# Patient Record
Sex: Female | Born: 1965
Health system: Southern US, Community
[De-identification: ages and names within clinical notes are randomized; demographics above are authoritative.]

## PROBLEM LIST (undated history)

## (undated) DIAGNOSIS — F419 Anxiety disorder, unspecified: Secondary | ICD-10-CM

## (undated) DIAGNOSIS — D649 Anemia, unspecified: Secondary | ICD-10-CM

## (undated) DIAGNOSIS — K759 Inflammatory liver disease, unspecified: Secondary | ICD-10-CM

## (undated) DIAGNOSIS — M67972 Unspecified disorder of synovium and tendon, left ankle and foot: Secondary | ICD-10-CM

## (undated) DIAGNOSIS — G4733 Obstructive sleep apnea (adult) (pediatric): Secondary | ICD-10-CM

## (undated) DIAGNOSIS — Z9889 Other specified postprocedural states: Secondary | ICD-10-CM

## (undated) DIAGNOSIS — N8502 Endometrial intraepithelial neoplasia [EIN]: Secondary | ICD-10-CM

## (undated) DIAGNOSIS — R112 Nausea with vomiting, unspecified: Secondary | ICD-10-CM

## (undated) DIAGNOSIS — K589 Irritable bowel syndrome without diarrhea: Secondary | ICD-10-CM

## (undated) DIAGNOSIS — I10 Essential (primary) hypertension: Secondary | ICD-10-CM

## (undated) DIAGNOSIS — L430 Hypertrophic lichen planus: Secondary | ICD-10-CM

## (undated) DIAGNOSIS — G4700 Insomnia, unspecified: Secondary | ICD-10-CM

## (undated) DIAGNOSIS — K219 Gastro-esophageal reflux disease without esophagitis: Secondary | ICD-10-CM

## (undated) DIAGNOSIS — F32A Depression, unspecified: Secondary | ICD-10-CM

## (undated) DIAGNOSIS — M1990 Unspecified osteoarthritis, unspecified site: Secondary | ICD-10-CM

## (undated) DIAGNOSIS — F329 Major depressive disorder, single episode, unspecified: Secondary | ICD-10-CM

## (undated) HISTORY — DX: Major depressive disorder, single episode, unspecified: F32.9

## (undated) HISTORY — DX: Gastro-esophageal reflux disease without esophagitis: K21.9

## (undated) HISTORY — DX: Unspecified osteoarthritis, unspecified site: M19.90

## (undated) HISTORY — PX: UPPER GI ENDOSCOPY: SHX6162

## (undated) HISTORY — DX: Insomnia, unspecified: G47.00

## (undated) HISTORY — DX: Anemia, unspecified: D64.9

## (undated) HISTORY — DX: Obstructive sleep apnea (adult) (pediatric): G47.33

## (undated) HISTORY — DX: Hypertrophic lichen planus: L43.0

## (undated) HISTORY — DX: Depression, unspecified: F32.A

---

## 1985-08-24 HISTORY — PX: CHOLECYSTECTOMY: SHX55

## 1998-02-14 ENCOUNTER — Encounter: Admission: RE | Admit: 1998-02-14 | Discharge: 1998-02-14 | Payer: Self-pay | Admitting: Family Medicine

## 1998-02-19 ENCOUNTER — Encounter: Admission: RE | Admit: 1998-02-19 | Discharge: 1998-02-19 | Payer: Self-pay | Admitting: Family Medicine

## 1998-03-05 ENCOUNTER — Emergency Department (HOSPITAL_COMMUNITY): Admission: EM | Admit: 1998-03-05 | Discharge: 1998-03-05 | Payer: Self-pay | Admitting: Emergency Medicine

## 1998-04-05 ENCOUNTER — Encounter: Admission: RE | Admit: 1998-04-05 | Discharge: 1998-04-05 | Payer: Self-pay | Admitting: Family Medicine

## 1998-05-03 ENCOUNTER — Encounter: Admission: RE | Admit: 1998-05-03 | Discharge: 1998-05-03 | Payer: Self-pay | Admitting: Family Medicine

## 1998-05-17 ENCOUNTER — Encounter: Admission: RE | Admit: 1998-05-17 | Discharge: 1998-05-17 | Payer: Self-pay | Admitting: Family Medicine

## 1998-05-21 ENCOUNTER — Encounter: Admission: RE | Admit: 1998-05-21 | Discharge: 1998-05-21 | Payer: Self-pay | Admitting: Sports Medicine

## 1998-05-28 ENCOUNTER — Encounter: Admission: RE | Admit: 1998-05-28 | Discharge: 1998-05-28 | Payer: Self-pay | Admitting: Family Medicine

## 1998-07-16 ENCOUNTER — Emergency Department (HOSPITAL_COMMUNITY): Admission: EM | Admit: 1998-07-16 | Discharge: 1998-07-16 | Payer: Self-pay | Admitting: Emergency Medicine

## 1998-08-09 ENCOUNTER — Encounter: Admission: RE | Admit: 1998-08-09 | Discharge: 1998-08-09 | Payer: Self-pay | Admitting: Family Medicine

## 1998-08-26 ENCOUNTER — Encounter: Admission: RE | Admit: 1998-08-26 | Discharge: 1998-08-26 | Payer: Self-pay | Admitting: Family Medicine

## 1998-08-29 ENCOUNTER — Encounter: Admission: RE | Admit: 1998-08-29 | Discharge: 1998-08-29 | Payer: Self-pay | Admitting: Family Medicine

## 1998-09-20 ENCOUNTER — Encounter: Admission: RE | Admit: 1998-09-20 | Discharge: 1998-09-20 | Payer: Self-pay | Admitting: Family Medicine

## 1998-10-08 ENCOUNTER — Encounter: Admission: RE | Admit: 1998-10-08 | Discharge: 1998-10-08 | Payer: Self-pay | Admitting: Sports Medicine

## 1998-10-25 ENCOUNTER — Encounter: Admission: RE | Admit: 1998-10-25 | Discharge: 1998-10-25 | Payer: Self-pay | Admitting: Family Medicine

## 1998-11-20 ENCOUNTER — Encounter: Payer: Self-pay | Admitting: Gastroenterology

## 1998-11-20 ENCOUNTER — Ambulatory Visit (HOSPITAL_COMMUNITY): Admission: RE | Admit: 1998-11-20 | Discharge: 1998-11-20 | Payer: Self-pay | Admitting: Gastroenterology

## 1999-01-30 ENCOUNTER — Encounter: Admission: RE | Admit: 1999-01-30 | Discharge: 1999-01-30 | Payer: Self-pay | Admitting: Family Medicine

## 1999-01-31 ENCOUNTER — Encounter: Admission: RE | Admit: 1999-01-31 | Discharge: 1999-01-31 | Payer: Self-pay | Admitting: Family Medicine

## 1999-03-20 ENCOUNTER — Encounter: Payer: Self-pay | Admitting: Obstetrics and Gynecology

## 1999-03-20 ENCOUNTER — Ambulatory Visit (HOSPITAL_COMMUNITY): Admission: RE | Admit: 1999-03-20 | Discharge: 1999-03-20 | Payer: Self-pay | Admitting: Obstetrics and Gynecology

## 1999-04-07 ENCOUNTER — Ambulatory Visit (HOSPITAL_COMMUNITY): Admission: RE | Admit: 1999-04-07 | Discharge: 1999-04-07 | Payer: Self-pay | Admitting: Gastroenterology

## 1999-04-07 ENCOUNTER — Encounter: Payer: Self-pay | Admitting: Gastroenterology

## 1999-04-14 ENCOUNTER — Encounter: Admission: RE | Admit: 1999-04-14 | Discharge: 1999-04-14 | Payer: Self-pay | Admitting: Family Medicine

## 1999-05-07 ENCOUNTER — Encounter: Admission: RE | Admit: 1999-05-07 | Discharge: 1999-05-07 | Payer: Self-pay | Admitting: Family Medicine

## 1999-05-28 ENCOUNTER — Encounter: Admission: RE | Admit: 1999-05-28 | Discharge: 1999-05-28 | Payer: Self-pay | Admitting: Family Medicine

## 1999-08-27 ENCOUNTER — Encounter: Admission: RE | Admit: 1999-08-27 | Discharge: 1999-08-27 | Payer: Self-pay | Admitting: Family Medicine

## 1999-09-29 ENCOUNTER — Encounter: Admission: RE | Admit: 1999-09-29 | Discharge: 1999-10-30 | Payer: Self-pay | Admitting: Sports Medicine

## 1999-10-13 ENCOUNTER — Encounter: Admission: RE | Admit: 1999-10-13 | Discharge: 1999-10-13 | Payer: Self-pay | Admitting: Family Medicine

## 1999-11-18 ENCOUNTER — Encounter: Admission: RE | Admit: 1999-11-18 | Discharge: 1999-11-18 | Payer: Self-pay | Admitting: Sports Medicine

## 2000-02-23 ENCOUNTER — Encounter: Admission: RE | Admit: 2000-02-23 | Discharge: 2000-02-23 | Payer: Self-pay | Admitting: Family Medicine

## 2000-03-04 ENCOUNTER — Encounter: Admission: RE | Admit: 2000-03-04 | Discharge: 2000-03-04 | Payer: Self-pay | Admitting: Family Medicine

## 2000-04-27 ENCOUNTER — Ambulatory Visit (HOSPITAL_COMMUNITY): Admission: RE | Admit: 2000-04-27 | Discharge: 2000-04-27 | Payer: Self-pay | Admitting: Gastroenterology

## 2000-04-27 ENCOUNTER — Encounter (INDEPENDENT_AMBULATORY_CARE_PROVIDER_SITE_OTHER): Payer: Self-pay | Admitting: *Deleted

## 2000-05-27 ENCOUNTER — Encounter: Admission: RE | Admit: 2000-05-27 | Discharge: 2000-05-27 | Payer: Self-pay | Admitting: Family Medicine

## 2000-06-18 ENCOUNTER — Encounter: Admission: RE | Admit: 2000-06-18 | Discharge: 2000-06-18 | Payer: Self-pay | Admitting: Family Medicine

## 2000-06-18 ENCOUNTER — Ambulatory Visit (HOSPITAL_COMMUNITY): Admission: RE | Admit: 2000-06-18 | Discharge: 2000-06-18 | Payer: Self-pay | Admitting: Family Medicine

## 2000-07-12 ENCOUNTER — Ambulatory Visit (HOSPITAL_COMMUNITY): Admission: RE | Admit: 2000-07-12 | Discharge: 2000-07-12 | Payer: Self-pay | Admitting: Sports Medicine

## 2000-08-09 ENCOUNTER — Encounter: Payer: Self-pay | Admitting: Obstetrics and Gynecology

## 2000-08-09 ENCOUNTER — Ambulatory Visit (HOSPITAL_COMMUNITY): Admission: RE | Admit: 2000-08-09 | Discharge: 2000-08-09 | Payer: Self-pay | Admitting: Obstetrics and Gynecology

## 2000-10-12 ENCOUNTER — Encounter: Admission: RE | Admit: 2000-10-12 | Discharge: 2000-10-12 | Payer: Self-pay | Admitting: Sports Medicine

## 2000-12-22 ENCOUNTER — Encounter: Admission: RE | Admit: 2000-12-22 | Discharge: 2000-12-22 | Payer: Self-pay | Admitting: Family Medicine

## 2001-01-28 ENCOUNTER — Encounter: Admission: RE | Admit: 2001-01-28 | Discharge: 2001-01-28 | Payer: Self-pay | Admitting: Family Medicine

## 2001-06-16 ENCOUNTER — Ambulatory Visit (HOSPITAL_BASED_OUTPATIENT_CLINIC_OR_DEPARTMENT_OTHER): Admission: RE | Admit: 2001-06-16 | Discharge: 2001-06-16 | Payer: Self-pay | Admitting: Orthopedic Surgery

## 2001-07-12 ENCOUNTER — Encounter: Admission: RE | Admit: 2001-07-12 | Discharge: 2001-10-06 | Payer: Self-pay | Admitting: Orthopedic Surgery

## 2001-10-25 ENCOUNTER — Other Ambulatory Visit: Admission: RE | Admit: 2001-10-25 | Discharge: 2001-10-25 | Payer: Self-pay | Admitting: Obstetrics and Gynecology

## 2001-11-18 ENCOUNTER — Encounter: Payer: Self-pay | Admitting: Obstetrics and Gynecology

## 2001-11-18 ENCOUNTER — Ambulatory Visit (HOSPITAL_COMMUNITY): Admission: RE | Admit: 2001-11-18 | Discharge: 2001-11-18 | Payer: Self-pay | Admitting: Obstetrics and Gynecology

## 2002-03-19 ENCOUNTER — Encounter: Payer: Self-pay | Admitting: Emergency Medicine

## 2002-03-19 ENCOUNTER — Emergency Department (HOSPITAL_COMMUNITY): Admission: EM | Admit: 2002-03-19 | Discharge: 2002-03-19 | Payer: Self-pay

## 2002-08-24 HISTORY — PX: KNEE SURGERY: SHX244

## 2003-10-18 ENCOUNTER — Emergency Department (HOSPITAL_COMMUNITY): Admission: AD | Admit: 2003-10-18 | Discharge: 2003-10-18 | Payer: Self-pay | Admitting: Family Medicine

## 2004-04-10 ENCOUNTER — Emergency Department (HOSPITAL_COMMUNITY): Admission: EM | Admit: 2004-04-10 | Discharge: 2004-04-10 | Payer: Self-pay | Admitting: Family Medicine

## 2004-11-18 ENCOUNTER — Ambulatory Visit (HOSPITAL_COMMUNITY): Admission: RE | Admit: 2004-11-18 | Discharge: 2004-11-18 | Payer: Self-pay | Admitting: Obstetrics and Gynecology

## 2005-07-24 ENCOUNTER — Emergency Department (HOSPITAL_COMMUNITY): Admission: EM | Admit: 2005-07-24 | Discharge: 2005-07-24 | Payer: Self-pay | Admitting: Emergency Medicine

## 2006-01-27 ENCOUNTER — Ambulatory Visit (HOSPITAL_COMMUNITY): Admission: RE | Admit: 2006-01-27 | Discharge: 2006-01-27 | Payer: Self-pay | Admitting: Obstetrics and Gynecology

## 2006-02-10 ENCOUNTER — Encounter: Admission: RE | Admit: 2006-02-10 | Discharge: 2006-02-10 | Payer: Self-pay | Admitting: Obstetrics and Gynecology

## 2006-08-30 ENCOUNTER — Emergency Department (HOSPITAL_COMMUNITY): Admission: EM | Admit: 2006-08-30 | Discharge: 2006-08-31 | Payer: Self-pay | Admitting: Emergency Medicine

## 2006-09-08 ENCOUNTER — Emergency Department (HOSPITAL_COMMUNITY): Admission: EM | Admit: 2006-09-08 | Discharge: 2006-09-08 | Payer: Self-pay | Admitting: Emergency Medicine

## 2007-04-28 ENCOUNTER — Ambulatory Visit (HOSPITAL_COMMUNITY): Admission: RE | Admit: 2007-04-28 | Discharge: 2007-04-28 | Payer: Self-pay | Admitting: Obstetrics and Gynecology

## 2007-12-16 ENCOUNTER — Emergency Department (HOSPITAL_COMMUNITY): Admission: EM | Admit: 2007-12-16 | Discharge: 2007-12-17 | Payer: Self-pay | Admitting: Emergency Medicine

## 2008-01-09 ENCOUNTER — Emergency Department (HOSPITAL_COMMUNITY): Admission: EM | Admit: 2008-01-09 | Discharge: 2008-01-09 | Payer: Self-pay | Admitting: Emergency Medicine

## 2008-02-01 ENCOUNTER — Ambulatory Visit: Payer: Self-pay | Admitting: Family Medicine

## 2008-02-01 ENCOUNTER — Encounter: Payer: Self-pay | Admitting: Family Medicine

## 2008-02-01 DIAGNOSIS — F3289 Other specified depressive episodes: Secondary | ICD-10-CM | POA: Insufficient documentation

## 2008-02-01 DIAGNOSIS — D649 Anemia, unspecified: Secondary | ICD-10-CM | POA: Insufficient documentation

## 2008-02-01 DIAGNOSIS — K219 Gastro-esophageal reflux disease without esophagitis: Secondary | ICD-10-CM | POA: Insufficient documentation

## 2008-02-01 LAB — CONVERTED CEMR LAB
ALT: 18 units/L (ref 0–35)
AST: 20 units/L (ref 0–37)
Alkaline Phosphatase: 39 units/L (ref 39–117)
LDL Cholesterol: 123 mg/dL — ABNORMAL HIGH (ref 0–99)
MCHC: 32.9 g/dL (ref 30.0–36.0)
MCV: 83.6 fL (ref 78.0–100.0)
Platelets: 349 10*3/uL (ref 150–400)
Potassium: 4.1 meq/L (ref 3.5–5.3)
RDW: 14.2 % (ref 11.5–15.5)
Sodium: 140 meq/L (ref 135–145)
TSH: 1.544 microintl units/mL (ref 0.350–5.50)
Total Bilirubin: 0.3 mg/dL (ref 0.3–1.2)
Total Protein: 7.9 g/dL (ref 6.0–8.3)
Triglycerides: 97 mg/dL (ref <150)
VLDL: 19 mg/dL (ref 0–40)

## 2008-02-03 ENCOUNTER — Encounter: Payer: Self-pay | Admitting: Family Medicine

## 2008-03-01 ENCOUNTER — Telehealth: Payer: Self-pay | Admitting: *Deleted

## 2008-03-17 ENCOUNTER — Emergency Department (HOSPITAL_COMMUNITY): Admission: EM | Admit: 2008-03-17 | Discharge: 2008-03-17 | Payer: Self-pay | Admitting: Emergency Medicine

## 2008-03-19 ENCOUNTER — Telehealth: Payer: Self-pay | Admitting: *Deleted

## 2008-04-06 ENCOUNTER — Ambulatory Visit: Payer: Self-pay | Admitting: Family Medicine

## 2008-06-08 ENCOUNTER — Ambulatory Visit (HOSPITAL_COMMUNITY): Admission: RE | Admit: 2008-06-08 | Discharge: 2008-06-08 | Payer: Self-pay | Admitting: Obstetrics and Gynecology

## 2008-06-15 ENCOUNTER — Ambulatory Visit: Payer: Self-pay | Admitting: Family Medicine

## 2008-07-17 ENCOUNTER — Telehealth: Payer: Self-pay | Admitting: Family Medicine

## 2008-07-29 ENCOUNTER — Emergency Department (HOSPITAL_COMMUNITY): Admission: EM | Admit: 2008-07-29 | Discharge: 2008-07-29 | Payer: Self-pay | Admitting: Emergency Medicine

## 2008-08-31 ENCOUNTER — Telehealth (INDEPENDENT_AMBULATORY_CARE_PROVIDER_SITE_OTHER): Payer: Self-pay | Admitting: *Deleted

## 2008-09-09 ENCOUNTER — Emergency Department (HOSPITAL_COMMUNITY): Admission: EM | Admit: 2008-09-09 | Discharge: 2008-09-09 | Payer: Self-pay | Admitting: Emergency Medicine

## 2008-10-15 ENCOUNTER — Encounter: Payer: Self-pay | Admitting: Family Medicine

## 2008-10-18 ENCOUNTER — Telehealth: Payer: Self-pay | Admitting: Family Medicine

## 2008-11-09 ENCOUNTER — Ambulatory Visit: Payer: Self-pay | Admitting: Family Medicine

## 2008-11-09 ENCOUNTER — Ambulatory Visit (HOSPITAL_COMMUNITY): Payer: Self-pay | Admitting: Psychiatry

## 2008-11-13 ENCOUNTER — Telehealth (INDEPENDENT_AMBULATORY_CARE_PROVIDER_SITE_OTHER): Payer: Self-pay | Admitting: *Deleted

## 2008-11-14 ENCOUNTER — Telehealth (INDEPENDENT_AMBULATORY_CARE_PROVIDER_SITE_OTHER): Payer: Self-pay | Admitting: *Deleted

## 2008-11-16 ENCOUNTER — Telehealth (INDEPENDENT_AMBULATORY_CARE_PROVIDER_SITE_OTHER): Payer: Self-pay | Admitting: Family Medicine

## 2008-11-22 ENCOUNTER — Emergency Department (HOSPITAL_COMMUNITY): Admission: EM | Admit: 2008-11-22 | Discharge: 2008-11-22 | Payer: Self-pay | Admitting: Emergency Medicine

## 2008-12-06 ENCOUNTER — Encounter: Payer: Self-pay | Admitting: Family Medicine

## 2008-12-06 ENCOUNTER — Ambulatory Visit (HOSPITAL_BASED_OUTPATIENT_CLINIC_OR_DEPARTMENT_OTHER): Admission: RE | Admit: 2008-12-06 | Discharge: 2008-12-06 | Payer: Self-pay | Admitting: Family Medicine

## 2008-12-08 ENCOUNTER — Ambulatory Visit: Payer: Self-pay | Admitting: Internal Medicine

## 2008-12-14 ENCOUNTER — Telehealth: Payer: Self-pay | Admitting: Family Medicine

## 2008-12-17 ENCOUNTER — Telehealth: Payer: Self-pay | Admitting: *Deleted

## 2008-12-18 ENCOUNTER — Telehealth (INDEPENDENT_AMBULATORY_CARE_PROVIDER_SITE_OTHER): Payer: Self-pay | Admitting: *Deleted

## 2008-12-26 ENCOUNTER — Telehealth (INDEPENDENT_AMBULATORY_CARE_PROVIDER_SITE_OTHER): Payer: Self-pay | Admitting: *Deleted

## 2008-12-28 ENCOUNTER — Ambulatory Visit: Payer: Self-pay | Admitting: Pulmonary Disease

## 2008-12-28 DIAGNOSIS — G4733 Obstructive sleep apnea (adult) (pediatric): Secondary | ICD-10-CM | POA: Insufficient documentation

## 2009-01-02 ENCOUNTER — Encounter: Payer: Self-pay | Admitting: Family Medicine

## 2009-01-04 ENCOUNTER — Ambulatory Visit: Payer: Self-pay | Admitting: Family Medicine

## 2009-01-04 DIAGNOSIS — R030 Elevated blood-pressure reading, without diagnosis of hypertension: Secondary | ICD-10-CM | POA: Insufficient documentation

## 2009-01-04 DIAGNOSIS — E669 Obesity, unspecified: Secondary | ICD-10-CM | POA: Insufficient documentation

## 2009-01-08 ENCOUNTER — Encounter: Payer: Self-pay | Admitting: *Deleted

## 2009-01-09 ENCOUNTER — Telehealth: Payer: Self-pay | Admitting: Family Medicine

## 2009-01-17 ENCOUNTER — Ambulatory Visit: Payer: Self-pay | Admitting: Family Medicine

## 2009-01-17 ENCOUNTER — Telehealth: Payer: Self-pay | Admitting: *Deleted

## 2009-01-18 ENCOUNTER — Telehealth: Payer: Self-pay | Admitting: *Deleted

## 2009-01-20 ENCOUNTER — Emergency Department (HOSPITAL_COMMUNITY): Admission: EM | Admit: 2009-01-20 | Discharge: 2009-01-20 | Payer: Self-pay | Admitting: Family Medicine

## 2009-01-22 ENCOUNTER — Ambulatory Visit: Payer: Self-pay | Admitting: Family Medicine

## 2009-01-22 ENCOUNTER — Encounter: Payer: Self-pay | Admitting: Family Medicine

## 2009-01-23 ENCOUNTER — Telehealth: Payer: Self-pay | Admitting: *Deleted

## 2009-01-24 ENCOUNTER — Telehealth: Payer: Self-pay | Admitting: Family Medicine

## 2009-01-29 ENCOUNTER — Encounter: Payer: Self-pay | Admitting: Family Medicine

## 2009-02-01 ENCOUNTER — Ambulatory Visit: Payer: Self-pay | Admitting: Family Medicine

## 2009-02-01 ENCOUNTER — Encounter: Payer: Self-pay | Admitting: Family Medicine

## 2009-02-07 ENCOUNTER — Telehealth: Payer: Self-pay | Admitting: Family Medicine

## 2009-02-08 ENCOUNTER — Telehealth: Payer: Self-pay | Admitting: Family Medicine

## 2009-02-15 ENCOUNTER — Telehealth: Payer: Self-pay | Admitting: Family Medicine

## 2009-02-26 ENCOUNTER — Telehealth: Payer: Self-pay | Admitting: *Deleted

## 2009-03-11 ENCOUNTER — Telehealth: Payer: Self-pay | Admitting: *Deleted

## 2009-06-19 ENCOUNTER — Ambulatory Visit: Payer: Self-pay | Admitting: Family Medicine

## 2009-07-22 ENCOUNTER — Encounter: Admission: RE | Admit: 2009-07-22 | Discharge: 2009-07-22 | Payer: Self-pay | Admitting: Family Medicine

## 2009-07-31 ENCOUNTER — Emergency Department (HOSPITAL_COMMUNITY): Admission: EM | Admit: 2009-07-31 | Discharge: 2009-07-31 | Payer: Self-pay | Admitting: Emergency Medicine

## 2009-08-13 ENCOUNTER — Ambulatory Visit: Payer: Self-pay | Admitting: Family Medicine

## 2009-08-13 LAB — CONVERTED CEMR LAB

## 2009-08-19 ENCOUNTER — Encounter: Payer: Self-pay | Admitting: Family Medicine

## 2009-08-19 ENCOUNTER — Ambulatory Visit: Payer: Self-pay | Admitting: Family Medicine

## 2009-08-19 LAB — CONVERTED CEMR LAB
AST: 13 units/L (ref 0–37)
Albumin: 4.2 g/dL (ref 3.5–5.2)
Alkaline Phosphatase: 40 units/L (ref 39–117)
BUN: 8 mg/dL (ref 6–23)
Creatinine, Ser: 0.86 mg/dL (ref 0.40–1.20)
HDL: 73 mg/dL (ref >39)
Hemoglobin: 10.2 g/dL — ABNORMAL LOW (ref 12.0–15.0)
LDL Cholesterol: 126 mg/dL — ABNORMAL HIGH (ref 0–99)
MCHC: 30.9 g/dL (ref 30.0–36.0)
Platelets: 339 10*3/uL (ref 150–400)
Potassium: 4 meq/L (ref 3.5–5.3)
RDW: 16.3 % — ABNORMAL HIGH (ref 11.5–15.5)
TSH: 1.157 microintl units/mL (ref 0.350–4.500)
Total CHOL/HDL Ratio: 2.9

## 2009-08-20 ENCOUNTER — Telehealth: Payer: Self-pay | Admitting: Family Medicine

## 2009-09-19 ENCOUNTER — Telehealth (INDEPENDENT_AMBULATORY_CARE_PROVIDER_SITE_OTHER): Payer: Self-pay | Admitting: *Deleted

## 2009-11-06 ENCOUNTER — Ambulatory Visit: Payer: Self-pay | Admitting: Family Medicine

## 2009-11-06 DIAGNOSIS — F411 Generalized anxiety disorder: Secondary | ICD-10-CM | POA: Insufficient documentation

## 2009-11-22 ENCOUNTER — Encounter: Payer: Self-pay | Admitting: Family Medicine

## 2009-12-06 ENCOUNTER — Ambulatory Visit: Payer: Self-pay | Admitting: Family Medicine

## 2009-12-06 ENCOUNTER — Encounter: Payer: Self-pay | Admitting: Family Medicine

## 2009-12-08 ENCOUNTER — Encounter: Payer: Self-pay | Admitting: Family Medicine

## 2009-12-09 ENCOUNTER — Telehealth: Payer: Self-pay | Admitting: Family Medicine

## 2010-01-16 ENCOUNTER — Ambulatory Visit: Payer: Self-pay | Admitting: Family Medicine

## 2010-04-09 ENCOUNTER — Telehealth: Payer: Self-pay | Admitting: Family Medicine

## 2010-04-11 ENCOUNTER — Ambulatory Visit: Payer: Self-pay | Admitting: Family Medicine

## 2010-07-03 ENCOUNTER — Encounter: Payer: Self-pay | Admitting: Family Medicine

## 2010-08-06 ENCOUNTER — Telehealth: Payer: Self-pay | Admitting: Family Medicine

## 2010-09-03 ENCOUNTER — Ambulatory Visit (HOSPITAL_COMMUNITY)
Admission: RE | Admit: 2010-09-03 | Discharge: 2010-09-03 | Payer: Self-pay | Source: Home / Self Care | Attending: Family Medicine | Admitting: Family Medicine

## 2010-09-14 ENCOUNTER — Encounter: Payer: Self-pay | Admitting: Orthopedic Surgery

## 2010-09-14 ENCOUNTER — Encounter: Payer: Self-pay | Admitting: Family Medicine

## 2010-09-14 ENCOUNTER — Encounter: Payer: Self-pay | Admitting: Obstetrics and Gynecology

## 2010-09-23 NOTE — Assessment & Plan Note (Signed)
Summary: discuss antidepressants/[place]   Vital Signs:  Patient profile:   45 year old female Height:      65.5 inches Weight:      254.9 pounds BMI:     41.92 Pulse rate:   76 / minute BP sitting:   138 / 86  (left arm) Cuff size:   large  Vitals Entered By: [name] slade,cma CC: discuss antidepressant. Is Patient Diabetic? No Pain Assessment Patient in pain? no        Primary Care Dangelo Guzzetta:  Cat Ta MD  CC:  discuss antidepressant..  History of Present Illness: 45 y/o F here for med s/e.  She has history of anxiety and is taking welbutrin xr 300mg .   Took herself off of her med in April, did this without informing us.  Then she resumed the medicine 2 wks ago, at dose of 300mg .  When she started on welbutrin initial dose was 150mg .  After beeing on the med for 1 1/2 wks she began to feel  +nausea +headache +decrease appetite -vomiting -fever +some abd pain +feeling bad all over -palpitations + chills  She stopped taking welbutrin 3 days ago.  She is beginning to feel better, but not 100%.    Habits & Providers  Alcohol-Tobacco-Diet     Tobacco Status: never  Current Medications (verified): 1)  Nexium 40 Mg  Cpdr (Esomeprazole Magnesium) .Marland Kitchen.. 1 Tab By Mouth Daily 2)  Bupropion Hcl 150 Mg Xr24h-Tab (Bupropion Hcl) .Marland Kitchen.. 1 Tab By Mouth Daily 3)  Clobetasol Propionate 0.05 % Oint (Clobetasol Propionate) .... Apply To Affected Area Twice A Day Until Resolved 4)  Ferrous Sulfate 325 (65 Fe) Mg Tabs (Ferrous Sulfate) .Marland Kitchen.. 1 Tab By Mouth Two Times A Day For Iron Deficiency 5)  Colace 100 Mg Caps (Docusate Sodium) .Marland Kitchen.. 1 Tab By Mouth Two Times A Day While Taking Iron  Allergies (verified): No Known Drug Allergies  Past History:  Past Medical History: Last updated: 08/13/2009 Anemia of unknown cause Depression SLEEP APNEA, OBSTRUCTIVE: stopped using cpap because it irritated her ALLERGIC RHINITIS (ICD-477.9) ATYPICAL DEPRESSIVE DISORDER (ICD-296.82) INSOMNIA  (ICD-780.52) OSTEOARTHRITIS (ICD-715.90)    of knees, shoulders, left hip ANEMIA (ICD-285.9) GERD (ICD-530.81) G2P1011 by NSVD    Past Surgical History: Last updated: 08/13/2009 Cholecystectomy 1987 Left knee lateral release 2004 - Dr. Eulah Pont  Family History: Last updated: 08/13/2009 Mother living, 61 OA, DJD, HTN, impaired glucose tolerance Father living, 70 DM 2 sisters with HTN 4 other siblings, all living    allergies: son, mother, sister asthma: son clotting disorders: mother rheumatism:mother   Social History: Last updated: 08/13/2009 pt is single.  Lives with 7 year old son.    No tobacco.   No illicit drug use.   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   Risk Factors: Alcohol Use: <1 (08/13/2009) Exercise: no (08/13/2009)  Risk Factors: Smoking Status: never (04/11/2010)  Review of Systems       per hpi   Physical Exam  General:  Well-developed,well-nourished,in no acute distress; alert,appropriate and cooperative throughout examination. vitals reviewed.  Psych:  Cognition and judgment appear intact. Alert and cooperative with normal attention span and concentration. No apparent delusions, illusions, hallucinationsnot agitated, not suicidal, and not homicidal.     Impression & Recommendations:  Problem # 1:  ANXIETY DISORDER (ICD-300.00) Assessment Unchanged Pt had s/e after taking herself off bupropion for 4 months and restarting at higher dose (300mg ) than initial dose (150mg ).  We discussed not  repeating this and to wait 2 wks before resuming bupropion.  Will restart at lower dose of 150mg .  Pt to rtc in 2 mos for re-eval.  She agrees.  Her updated medication list for this problem includes:    Bupropion Hcl 150 Mg Xr24h-tab (Bupropion hcl) .Marland Kitchen... 1 tab by mouth daily  Orders: [facility]- Est Level  3 (21308)  Problem # 2:  ELEVATED BLOOD PRESSURE WITHOUT DIAGNOSIS OF HYPERTENSION (ICD-796.2) BP elevated today  but may be 2/2 to overall not feeling well.  We will continue to monitor.    Complete Medication List: 1)  Nexium 40 Mg Cpdr (Esomeprazole magnesium) .Marland Kitchen.. 1 tab by mouth daily 2)  Bupropion Hcl 150 Mg Xr24h-tab (Bupropion hcl) .Marland Kitchen.. 1 tab by mouth daily 3)  Clobetasol Propionate 0.05 % Oint (Clobetasol propionate) .... Apply to affected area twice a day until resolved 4)  Ferrous Sulfate 325 (65 Fe) Mg Tabs (Ferrous sulfate) .Marland Kitchen.. 1 tab by mouth two times a day for iron deficiency 5)  Colace 100 Mg Caps (Docusate sodium) .Marland Kitchen.. 1 tab by mouth two times a day while taking iron  Patient Instructions: 1)  Please schedule a follow-up appointment in 1-2 months.  2)  Starting Monday Aug 29, take one tablet of Bupoprion 150mg  in AM.  Take this daily.

## 2010-09-23 NOTE — Letter (Signed)
Summary: Lipid Letter  [facility]  [address]   [place], [place] [address]   Phone: [phone]  Fax: [phone]    12/08/2009  Barbara Reese [address] [place], [place]  [address]  Dear [name]:  We have carefully reviewed your last lipid profile from 12/06/2009 and the results are noted below with a summary of recommendations for lipid management.    LDL "bad" Cholesterol:    527  Your LDL has been increasing over the last 2 years.  In 07/2009 your LDL was 126, and 123 the year prior.  I feel that given your risk factors, it would be beneficial to your overall health to start taking a cholesterol lowering medicine.  At your last visit we discussed exercise and diet to help lower your weight and cholesterol.  You may try the lifestyle modifications included in this letter.  We can discuss this further at the next appointment.      TLC Diet (Therapeutic Lifestyle Change): Saturated Fats & Transfatty acids should be kept < 7% of total calories ***Reduce Saturated Fats Polyunstaurated Fat can be up to 10% of total calories Monounsaturated Fat Fat can be up to 20% of total calories Total Fat should be no greater than 25-35% of total calories Carbohydrates should be 50-60% of total calories Protein should be approximately 15% of total calories Fiber should be at least 20-30 grams a day ***Increased fiber may help lower LDL Total Cholesterol should be < 200mg /day Consider adding Reese stanol/sterols to diet (example: Benacol spread) ***A higher intake of unsaturated fat may reduce Triglycerides and Increase HDL    Adjunctive Measures (may lower LIPIDS and reduce risk of Heart Attack) include: Aerobic Exercise (20-30 minutes 3-4 times a week) Limit Alcohol Consumption Weight Reduction Aspirin 75-81 mg a day by mouth (if not allergic or contraindicated) Dietary Fiber 20-30 grams a day by mouth     Current Medications: 1)    Nexium 40 Mg  Cpdr  (Esomeprazole magnesium) .Marland Kitchen.. 1 tab by mouth daily 2)    Bupropion Hcl 300 Mg Xr24h-tab (Bupropion hcl) .Marland Kitchen.. 1 tab by mouth at bedtime.  note increased dose. 3)    Clobetasol Propionate 0.05 % Oint (Clobetasol propionate) .... Apply to affected area twice a day until resolved 4)    Ortho Micronor 0.35 Mg Tabs (Norethindrone) .Marland Kitchen.. 1 tab by mouth everyday for 3 months, then stop for a week, then resume for another 3 months, repeat. 5)    Ferrous Sulfate 325 (65 Fe) Mg Tabs (Ferrous sulfate) .Marland Kitchen.. 1 tab by mouth two times a day for iron deficiency 6)    Colace 100 Mg Caps (Docusate sodium) .Marland Kitchen.. 1 tab by mouth two times a day while taking iron  If you have any questions, please call. We appreciate being able to work with you.   Sincerely,    [facility] Moni Rothrock MD  Appended Document: Lipid Letter mailed.

## 2010-09-23 NOTE — Progress Notes (Signed)
Summary: TRIAGE  Phone Note Call from Patient Call back at Home Phone [phone]   Caller: Patient Summary of Call: Can be reached after 3:00 at home number.  Pt still has questions concerning side effects of medication for Wellbutrin. Initial call taken by: Clydell Hakim,  April 09, 2010 2:37 PM  Follow-up for Phone Call        Please ask pt to schedule appt.  She dnkas two appts.  I will see her in the clinic. Cat Ta MD  April 10, 2010 2:14 PM  Follow-up by: Golden Circle RN,  April 10, 2010 11:42 AM  Additional Follow-up for Phone Call Additional follow up Details #1::        started the 300mg  03/28/10. says her appetite is down & she has a bad taste in her mouth.  is she going to have the same side effects at the 150 dose? has not bought the 150mg  yet. has not taken anything in 2 days. appt made for her to meet with md at 3:15 tomorrow Additional Follow-up by: Golden Circle RN,  April 10, 2010 3:07 PM

## 2010-09-23 NOTE — Progress Notes (Signed)
Summary: triage  Phone Note Call from Patient Call back at Work Phone [phone]   Caller: Patient Summary of Call: Pt said she has white bumps in her vaginia and went last night and bought an anti fungal med and used one tube of this.  She has also started her period.   Initial call taken by: Clydell Hakim,  September 19, 2009 1:45 PM  Follow-up for Phone Call        patient states the white bumps are now gone. she did have some vaginal itching and small amount of discharge and this has improved also after using the cream. advised to call back if bumps  or symptoms return. Follow-up by: Theresia Lo RN,  September 19, 2009 2:18 PM

## 2010-09-23 NOTE — Progress Notes (Signed)
Summary: Lab Res  Phone Note Call from Patient Call back at Work Phone [phone]   Caller: Patient Summary of Call: Checking on blood work from Friday. Initial call taken by: Clydell Hakim,  December 09, 2009 11:41 AM  Follow-up for Phone Call        wll forward to Dr. Janalyn Harder Follow-up by: Theresia Lo RN,  December 09, 2009 12:01 PM  Additional Follow-up for Phone Call Additional follow up Details #1::        Letter sent regarding lab result.  Pt has appt this afternoon, can discuss then.  Additional Follow-up by: Shamarion Coots MD,  December 09, 2009 12:19 PM

## 2010-09-23 NOTE — Assessment & Plan Note (Signed)
Summary: Barbara Reese   Vital Signs:  Patient profile:   45 year old female Height:      65.5 inches Weight:      251.9 pounds BMI:     41.43 Pulse rate:   75 / minute BP sitting:   121 / 81  (left arm) Cuff size:   large  Vitals Entered By: [name] slade,cma CC: f/up labs.  Is Patient Diabetic? No Pain Assessment Patient in pain? no        Primary Care Provider:  Yoltzin Ransom MD  CC:  f/up labs. .  History of Present Illness: 45 y/o F   Anxiety Taking Buproprion 300mg  at bedtime.  No problems with meds.   No panic attacks  GERD taking Nexium daily without problems has not noticed problem with certain foods (spicy) No weight loss   OBESITY BMI: 41.43 Weight difference since last OV: +4lbs Exercise: walking everyday for 30 minutes at brisk pace.  Goes to gym 2-3x/week, riding bike, treadmill, weights.  Been doing this for 1 month.  Walking now for 2-3 months.   Diet: cut down on "sugar intake".  Using less sugar in coffee.  Drinking less sodas.  Not adding sugar to cereal. Admits to no portion control  Skin: Seeing Dr Joseph Art, Dermatologist.  He did b and  told her over a year ago, showing allergic reaction to a bite.  Rx Clobestasol ointment.  Wants to know if it is safe to continue,  Appt on 6/1 with Dr Joseph Art    Habits & Providers  Alcohol-Tobacco-Diet     Tobacco Status: never  Current Medications (verified): 1)  Nexium 40 Mg  Cpdr (Esomeprazole Magnesium) .Marland Kitchen.. 1 Tab By Mouth Daily 2)  Bupropion Hcl 300 Mg Xr24h-Tab (Bupropion Hcl) .Marland Kitchen.. 1 Tab By Mouth At Bedtime.  Note Increased Dose. 3)  Clobetasol Propionate 0.05 % Oint (Clobetasol Propionate) .... Apply To Affected Area Twice A Day Until Resolved 4)  Ortho Micronor 0.35 Mg Tabs (Norethindrone) .Marland Kitchen.. 1 Tab By Mouth Everyday For 3 Months, Then Stop For A Week, Then Resume For Another 3 Months, Repeat. 5)  Ferrous Sulfate 325 (65 Fe) Mg Tabs (Ferrous Sulfate) .Marland Kitchen.. 1 Tab By Mouth Two Times A Day For Iron Deficiency 6)   Colace 100 Mg Caps (Docusate Sodium) .Marland Kitchen.. 1 Tab By Mouth Two Times A Day While Taking Iron  Allergies (verified): No Known Drug Allergies  Past History:  Past Medical History: Last updated: 08/13/2009 Anemia of unknown cause Depression SLEEP APNEA, OBSTRUCTIVE: stopped using cpap because it irritated her ALLERGIC RHINITIS (ICD-477.9) ATYPICAL DEPRESSIVE DISORDER (ICD-296.82) INSOMNIA (ICD-780.52) OSTEOARTHRITIS (ICD-715.90)    of knees, shoulders, left hip ANEMIA (ICD-285.9) GERD (ICD-530.81) G2P1011 by NSVD    Past Surgical History: Last updated: 08/13/2009 Cholecystectomy 1987 Left knee lateral release 2004 - Dr. Eulah Pont  Family History: Last updated: 08/13/2009 Mother living, 2 OA, DJD, HTN, impaired glucose tolerance Father living, 73 DM 2 sisters with HTN 4 other siblings, all living    allergies: son, mother, sister asthma: son clotting disorders: mother rheumatism:mother   Social History: Last updated: 08/13/2009 pt is single.  Lives with 66 year old son.    No tobacco.   No illicit drug use.   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   Risk Factors: Alcohol Use: <1 (08/13/2009) Exercise: no (08/13/2009)  Risk Factors: Smoking Status: never (01/16/2010)  Review of Systems  The patient denies fever, vision loss, chest pain, syncope, dyspnea on  exertion, peripheral edema, abdominal pain, melena, hematuria, unusual weight change, and abnormal bleeding.    Physical Exam  General:  Well-developed,well-nourished,in no acute distress; alert,appropriate and cooperative throughout examination. vitals reviewed Mouth:  Oral mucosa and oropharynx without lesions or exudates.  Teeth in good repair. Lungs:  Normal respiratory effort, chest expands symmetrically. Lungs are clear to auscultation, no crackles or wheezes. Heart:  Normal rate and regular rhythm. S1 and S2 normal without gallop, murmur, click, rub or other  extra sounds. Abdomen:  Bowel sounds positive,abdomen soft and non-tender without masses, organomegaly or hernias noted. obese Extremities:  No clubbing, cyanosis, edema, or deformity noted with normal full range of motion of all joints.   Neurologic:  alert & oriented X3 and cranial nerves II-XII intact.     Impression & Recommendations:  Problem # 1:  ANXIETY DISORDER (ICD-300.00) Assessment Unchanged no change.  will continue buproprion 300mg  Her updated medication list for this problem includes:    Bupropion Hcl 300 Mg Xr24h-tab (Bupropion hcl) .Marland Kitchen... 1 tab by mouth at bedtime.  note increased dose.  Orders: [facility]- Est  Level 4 (04540)  Problem # 2:  GERD (ICD-530.81) Assessment: Unchanged No change.  Continue nexium 40mg  Her updated medication list for this problem includes:    Nexium 40 Mg Cpdr (Esomeprazole magnesium) .Marland Kitchen... 1 tab by mouth daily  Orders: [facility]- Est  Level 4 (99214)  Problem # 3:  OBESITY, MODERATE (ICD-278.00) BMI 41.4, increased from 40.6.  Pt is trying to work out, walking and going to gym.  We discussed portion control.  I have given her handout in past on calories.  Discussed plate division again.  Pt is very encouraged to continue exercising.  She will come back in 6 months.   Orders: [facility]- Est  Level 4 ([address]214)  Complete Medication List: 1)  Nexium 40 Mg Cpdr (Esomeprazole magnesium) .Marland Kitchen.. 1 tab by mouth daily 2)  Bupropion Hcl 300 Mg Xr24h-tab (Bupropion hcl) .Marland Kitchen.. 1 tab by mouth at bedtime.  note increased dose. 3)  Clobetasol Propionate 0.05 % Oint (Clobetasol propionate) .... Apply to affected area twice a day until resolved 4)  Ortho Micronor 0.35 Mg Tabs (Norethindrone) .Marland Kitchen.. 1 tab by mouth everyday for 3 months, then stop for a week, then resume for another 3 months, repeat. 5)  Ferrous Sulfate 325 (65 Fe) Mg Tabs (Ferrous sulfate) .Marland Kitchen.. 1 tab by mouth two times a day for iron deficiency 6)  Colace 100 Mg Caps (Docusate sodium) .Marland Kitchen.. 1 tab by mouth two  times a day while taking iron  Patient Instructions: 1)  Please schedule a follow-up appointment after 08/19/2010.  2)  Anytime after 08/19/10, Please make appt for cholesterol check.  This has to be fasting, so no food or drink after midnight the night before appointment. 3)  You are doing a great job with exercising. Continue the good work. Prescriptions: BUPROPION HCL 300 MG XR24H-TAB (BUPROPION HCL) 1 tab by mouth at bedtime.  Note increased dose.  #90 x 3   Entered and Authorized by:   Angeline Slim MD   Signed by:   Angeline Slim MD on 01/16/2010   Method used:   Electronically to        [facility]* (retail)       [address]       [address] Shipping/mailing       [place], [place]  [address]       Ph: [phone]  Fax: [phone]   RxID:   [id number]

## 2010-09-23 NOTE — Progress Notes (Signed)
Summary: Rx Prob  Phone Note Call from Patient Call back at Work Phone [phone]   Caller: Patient Summary of Call: Having side effects from the Wellbutrin would like to have it reduced to 150mg  vs 300.  Pt's pharmacy [facility] pharmacy. Initial call taken by: Clydell Hakim,  April 09, 2010 8:36 AM  Follow-up for Phone Call        to pcp Follow-up by: Golden Circle RN,  April 09, 2010 8:54 AM    New/Updated Medications: BUPROPION HCL 150 MG XR24H-TAB (BUPROPION HCL) 1 tab by mouth daily Prescriptions: BUPROPION HCL 150 MG XR24H-TAB (BUPROPION HCL) 1 tab by mouth daily  #30 x 6   Entered and Authorized by:   Angeline Slim MD   Signed by:   Angeline Slim MD on 04/09/2010   Method used:   Electronically to        [facility]* (retail)       [address]       [address] Shipping/mailing       [place], [place]  [address]       Ph: [phone]       Fax: [phone]   RxID:   [id number]

## 2010-09-23 NOTE — Miscellaneous (Signed)
Summary: Changing Prob List   Clinical Lists Changes  Problems: Removed problem of INSOMNIA (ICD-780.52) Removed problem of OSTEOARTHRITIS (ICD-715.90) Removed problem of CONTRACEPTIVE MANAGEMENT (ICD-V25.09) Removed problem of SCREENING FOR MALIGNANT NEOPLASM OF THE CERVIX (ICD-V76.2) Removed problem of PHYSICAL EXAMINATION (ICD-V70.0)

## 2010-09-23 NOTE — Assessment & Plan Note (Signed)
Summary: wants birth control,tcb   Vital Signs:  Patient profile:   45 year old female Height:      65.5 inches Weight:      247.31 pounds BMI:     40.68 Temp:     98.0 degrees F oral Pulse rate:   91 / minute BP sitting:   138 / 88  (left arm)  Vitals Entered By: Angeline Slim MD (November 06, 2009 3:44 PM) CC: discuss birth control/anxiety Is Patient Diabetic? No Pain Assessment Patient in pain? no        Primary Care Provider:  Onalee Steinbach MD  CC:  discuss birth control/anxiety.  History of Present Illness: 45 y/o F is here to discuss oral contraceptive to decrease menstrual flow.  She would like something to take for anxiety when she goes on her trip next week.    1.  Heavy menses.  Lasts 5-7 days.  First 3 days, she changes pad every 30-40 minutes.  She sometimes has to wear two pad and a tampons.  + clots.  This has been going on for most of her life.  She has regular cycles with monthly periods.  Was told by Dr Ambrose Mantle that she has fibroids but she does not desire hysterectomy.  She read that [facility] or Seasonal would help her heavy periods.    sometimes lightheadedness.    2.  Anxiety:  Taking welbutrin XR 300mg  daily.  Recently (1 month) she has felt more stressed.  She was as Diplomatic Services operational officer in NICU.  Feels that her temper is shorter.  "I can't make myself relax."  some palpitations.  No panic attacks.  Cannot identify source of her anxiety.  She is going on a car trip next week for her birthday.  She will not be driving.  She is concerned that she will be too anxious to take the trip.  Would like to take something short term for the trip.   3.  Anemia:  pt's cbc in 07/2009 showed Hb of 10 with MCV of 77.  Pt is supposed to take iron supplements but does not due to abdominal s/e (constipation).  She does endorse fatigue sometimes.    Habits & Providers  Alcohol-Tobacco-Diet     Tobacco Status: never  Current Medications (verified): 1)  Nexium 40 Mg  Cpdr (Esomeprazole Magnesium) .Marland Kitchen..  1 Tab By Mouth Daily 2)  Bupropion Hcl 300 Mg Xr24h-Tab (Bupropion Hcl) .Marland Kitchen.. 1 Tab By Mouth At Bedtime.  Note Increased Dose. 3)  Clobetasol Propionate 0.05 % Oint (Clobetasol Propionate) .... Apply To Affected Area Twice A Day Until Resolved  Allergies (verified): No Known Drug Allergies  Past History:  Past Medical History: Last updated: 08/13/2009 Anemia of unknown cause Depression SLEEP APNEA, OBSTRUCTIVE: stopped using cpap because it irritated her ALLERGIC RHINITIS (ICD-477.9) ATYPICAL DEPRESSIVE DISORDER (ICD-296.82) INSOMNIA (ICD-780.52) OSTEOARTHRITIS (ICD-715.90)    of knees, shoulders, left hip ANEMIA (ICD-285.9) GERD (ICD-530.81) G2P1011 by NSVD    Past Surgical History: Last updated: 08/13/2009 Cholecystectomy 1987 Left knee lateral release 2004 - Dr. Eulah Pont  Family History: Last updated: 08/13/2009 Mother living, 20 OA, DJD, HTN, impaired glucose tolerance Father living, 24 DM 2 sisters with HTN 4 other siblings, all living    allergies: son, mother, sister asthma: son clotting disorders: mother rheumatism:mother   Social History: Last updated: 08/13/2009 pt is single.  Lives with 64 year old son.    No tobacco.   No illicit drug use.   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 Patient never smoked.   Risk Factors: Alcohol Use: <1 (08/13/2009) Exercise: no (08/13/2009)  Risk Factors: Smoking Status: never (11/06/2009)  Review of Systems       per hpi  Physical Exam  General:  Well-developed,well-nourished,in no acute distress; alert,appropriate and cooperative throughout examination. vitals reviewed.  Head:  normocephalic and atraumatic.   Eyes:  no pale conjunctiva Neck:  supple, full ROM, and no masses.   Lungs:  Normal respiratory effort, chest expands symmetrically. Lungs are clear to auscultation, no crackles or wheezes. Heart:  Normal rate and regular rhythm. S1 and S2 normal without gallop, murmur, click, rub or  other extra sounds. Abdomen:  Bowel sounds positive,abdomen soft and non-tender without masses, organomegaly or hernias noted. obese Genitalia:  UPT: negative Extremities:  No clubbing, cyanosis, edema, or deformity noted with normal full range of motion of all joints.   Neurologic:  alert & oriented X3.   Skin:  Intact without suspicious lesions or rashes   Impression & Recommendations:  Problem # 1:  CONTRACEPTIVE MANAGEMENT (ICD-V25.09) Assessment Unchanged Given her age, obesity, and elevated BP I do not recommend estrogen ocps.  Gave pt options of depo or implanon or progestin only ocp.  She is concerned that depo may cause menorrhagia in the begining.  She declined inplanon.  Will try progestin ocp, Micronor.  She can take this continuously for 3 months, then stop for 1 wk to give her break through bleeding.  We will use micronor to mimic depo.  Orders: U Preg-[facility] (81025) [facility]- Est  Level 4 (91478)  Problem # 2:  ANXIETY DISORDER (ICD-300.00) Assessment: Unchanged  I do not believe that pt should be taking short acting benzo for this trip.  She does not have history of panic attacks.  She is on maxed dose of welbutrin.  Pt to keep track of when she has anxiety or feelings of being short tempered to see if we can find a trigger.  We discussed relaxation techniques.  Pt likes to read, so she can borrow CDs or Tapes on books from [employer] for her trip so that she can be distracted.  Her updated medication list for this problem includes:    Bupropion Hcl 300 Mg Xr24h-tab (Bupropion hcl) .Marland Kitchen... 1 tab by mouth at bedtime.  note increased dose.  Orders: [facility]- Est  Level 4 (29562)  Problem # 3:  ANEMIA (ICD-285.9) Assessment: Unchanged Advised pt to resume Fe supplements.  Advised her to take stool softeners to prevent constipation while on iron.  she agreed.  Her updated medication list for this problem includes:    Ferrous Sulfate 325 (65 Fe) Mg Tabs (Ferrous sulfate) .Marland Kitchen... 1 tab by mouth  two times a day for iron deficiency  Orders: [facility]- Est  Level 4 (13086)  Complete Medication List: 1)  Nexium 40 Mg Cpdr (Esomeprazole magnesium) .Marland Kitchen.. 1 tab by mouth daily 2)  Bupropion Hcl 300 Mg Xr24h-tab (Bupropion hcl) .Marland Kitchen.. 1 tab by mouth at bedtime.  note increased dose. 3)  Clobetasol Propionate 0.05 % Oint (Clobetasol propionate) .... Apply to affected area twice a day until resolved 4)  Ortho Micronor 0.35 Mg Tabs (Norethindrone) .Marland Kitchen.. 1 tab by mouth everyday for 3 months, then stop for a week, then resume for another 3 months, repeat. 5)  Ferrous Sulfate 325 (65 Fe) Mg Tabs (Ferrous sulfate) .Marland Kitchen.. 1 tab by mouth two times a day for iron deficiency 6)  Colace 100 Mg Caps (Docusate sodium) .Marland Kitchen.. 1 tab by  mouth two times a day while taking iron  Patient Instructions: 1)  Please schedule a follow-up appointment in 4-6 weeks.  2)  Keep track of what is causing you to be anxious so that we can discuss this again next time. 3)  New medicine: Micronor; take daily to control your periods.  You can take it for 3 months, stop for 1 week to have a period.  Then repeat again. 4)  Take iron pill two times a day. 5)  It is important that you exercise reguarly at least 30 -45 minutes at least 5 times a week. If you develop chest pain, have severe difficulty breathing, or feel very tired, stop exercising immediately and seek medical attention.  6)  Consider a lower calorie diet (1500 calories) and regular exercise.  Prescriptions: COLACE 100 MG CAPS (DOCUSATE SODIUM) 1 tab by mouth two times a day while taking iron  #60 x 11   Entered and Authorized by:   Angeline Slim MD   Signed by:   Angeline Slim MD on 11/06/2009   Method used:   Electronically to        [facility]* (retail)       [address]       [address] Shipping/mailing       [place], [place]  [address]       Ph: [phone]       Fax: [phone]   RxID:   [id number] FERROUS SULFATE 325 (65 FE) MG TABS (FERROUS SULFATE)  1 tab by mouth two times a day for iron deficiency  #60 x 11   Entered and Authorized by:   Angeline Slim MD   Signed by:   Angeline Slim MD on 11/06/2009   Method used:   Electronically to        [facility]* (retail)       [address]       [address] Shipping/mailing       [place], [place]  [address]       Ph: [phone]       Fax: [phone]   RxID:   [id number] ORTHO MICRONOR 0.35 MG TABS (NORETHINDRONE) 1 tab by mouth everyday for 3 months, then stop for a week, then resume for another 3 months, repeat.  #31 x 11   Entered and Authorized by:   Angeline Slim MD   Signed by:   Angeline Slim MD on 11/06/2009   Method used:   Electronically to        [facility]* (retail)       [address]       [address] Shipping/mailing       [place], [place]  [address]       Ph: [phone]       Fax: [phone]   RxID:   [id number]    Prevention & Chronic Care Immunizations   Influenza vaccine: given  (06/08/2008)   Influenza vaccine deferral: Not indicated  (08/13/2009)   Influenza vaccine due: 06/24/2010    Tetanus booster: Not documented    Pneumococcal vaccine: Not documented  Other Screening   Pap smear: NEGATIVE FOR INTRAEPITHELIAL LESIONS OR MALIGNANCY.  (08/13/2009)   Pap smear action/deferral: Deferred-3 yr interval  (11/06/2009)   Pap smear due: 08/14/2011    Mammogram: BI-RADS CATEGORY 1:  Negative.^MM DIGITAL DIAGNOSTIC BILAT  (07/22/2009)   Mammogram due: 06/08/2009   Smoking status: never  (11/06/2009)  Lipids   Total Cholesterol: 215  (08/19/2009)   LDL: 126  (08/19/2009)   LDL Direct: Not documented   HDL: 73  (08/19/2009)   Triglycerides: 81  (08/19/2009)  Laboratory Results   Urine Tests  Date/Time Received: November 06, 2009 4:27 PM  Date/Time Reported: November 06, 2009 4:40 PM     Urine HCG: negative Comments: ...............test performed by......Marland KitchenBonnie A. [place], MLS (ASCP)cm

## 2010-09-23 NOTE — Miscellaneous (Signed)
Summary: order for direct ldl  Clinical Lists Changes  Orders: Added new Test order of Direct LDL-[facility] [phone]) - Signed    Left message for pt to come in for Direct LDL test.  Pt should do this fasting so that I can compare it to 07/2009 result.  Tyshia Fenter MD  November 22, 2009 2:19 PM

## 2010-09-24 ENCOUNTER — Encounter: Payer: Self-pay | Admitting: Family Medicine

## 2010-09-24 ENCOUNTER — Ambulatory Visit: Admit: 2010-09-24 | Payer: Self-pay

## 2010-09-24 ENCOUNTER — Other Ambulatory Visit: Payer: Self-pay

## 2010-09-24 ENCOUNTER — Ambulatory Visit (INDEPENDENT_AMBULATORY_CARE_PROVIDER_SITE_OTHER): Payer: Commercial Managed Care - PPO | Admitting: Family Medicine

## 2010-09-24 ENCOUNTER — Ambulatory Visit (HOSPITAL_COMMUNITY)
Admission: RE | Admit: 2010-09-24 | Discharge: 2010-09-24 | Disposition: A | Discharge status: Home / Self Care | Payer: 59 | Source: Ambulatory Visit | Attending: Internal Medicine | Admitting: Internal Medicine

## 2010-09-24 DIAGNOSIS — R002 Palpitations: Secondary | ICD-10-CM | POA: Insufficient documentation

## 2010-09-24 LAB — CONVERTED CEMR LAB
BUN: 11 mg/dL (ref 6–23)
Calcium: 9.6 mg/dL (ref 8.4–10.5)
Free T4: 0.95 ng/dL (ref 0.80–1.80)
Glucose, Bld: 92 mg/dL (ref 70–99)
HCT: 35.5 % — ABNORMAL LOW (ref 36.0–46.0)
MCV: 80 fL (ref 78.0–100.0)
Platelets: 372 10*3/uL (ref 150–400)
Potassium: 4.2 meq/L (ref 3.5–5.3)
RBC: 4.44 M/uL (ref 3.87–5.11)
Sodium: 137 meq/L (ref 135–145)
TSH: 1.465 microintl units/mL (ref 0.350–4.500)
TSH: 1.465 microintl units/mL (ref 0.350–4.500)
WBC: 6.2 10*3/uL (ref 4.0–10.5)
WBC: 6.2 10*3/uL (ref 4.0–10.5)

## 2010-09-25 NOTE — Progress Notes (Signed)
  Phone Note Call from Patient   Caller: Patient Summary of Call: Ms. Tamm need a rx for Zpack sent to [facility] pharmacy.  You can reach her at [phone].  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  She spoke with the pharmacist at [facility] and they told her that's what she needed.  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 Initial call taken by: Abundio Miu,  August 06, 2010 9:18 AM    I cannot send in Rx without seeing pt.  I do not know what her symptoms are.  It is not appropriate for me to treat pt without seeing her in the clinic.  Pt has not been seen in clinic since 03/2010.  Pt can make appt to be seen as [place] when she can come in.  Cat Ta MD  August 06, 2010 9:51 AM

## 2010-09-26 ENCOUNTER — Telehealth: Payer: Self-pay | Admitting: Family Medicine

## 2010-10-03 ENCOUNTER — Ambulatory Visit: Payer: Commercial Managed Care - PPO | Admitting: Family Medicine

## 2010-10-09 NOTE — Progress Notes (Signed)
  Phone Note Call from Patient   Caller: Patient Call For:  ext [phone] Summary of Call: [name] received a call from you regarding coming in for Holter Monitor, but you don't have anything available.  [name] said she would need to have an order before she could put one on and that visit should really be with provider first.  Let me know what to do. Initial call taken by: Abundio Miu,  September 26, 2010 12:20 PM  Follow-up for Phone Call        Discussed with you earlier on Fri.  Pt can come see me at next avail appt.  It is not urgent that she is scheduled right away.  I will see her in clinic then will order holter monitor.  Follow-up by: Angeline Slim MD,  September 28, 2010 4:16 PM  Additional Follow-up for Phone Call Additional follow up Details #1::        Pt called and made appt for holter monitor and f/u with provider Additional Follow-up by: Abundio Miu,  September 30, 2010 9:16 AM

## 2010-10-09 NOTE — Assessment & Plan Note (Signed)
Summary: palpitations/bmc   Vital Signs:  Patient profile:   45 year old female Height:      65.5 inches Weight:      262.4 pounds BMI:     43.16 Temp:     98.8 degrees F oral Pulse rate:   81 / minute BP sitting:   148 / 90  Vitals Entered By: Angeline Slim MD (September 24, 2010 4:21 PM) CC: heart palpitations with cough x 2-3 weeks Pain Assessment Patient in pain? no      Nutritional Status BMI of > 30 = obese  Does patient need assistance? Functional Status Self care Ambulation Normal   Primary Care Provider:  Charli Liberatore MD  CC:  heart palpitations with cough x 2-3 weeks.  History of Present Illness: 45 y/o F with obesity and GERD is here for "palpitations" x 2 wks.    PALPTATIONS: She feels like a "flip" or a "flutter" like her heart is skipping a beat.  She would then get an urge to cough, which helps the palpitations.  She feels that she has to clear her throat, then the palpitations resolve.  These events last about 1 second or so, about 3-4 times to twice a day.  These events occur when she is lying down or sitting still or eat/swallowing.  Does not occur when she is walking/exercising/working.   She is a Diplomatic Services operational officer at [employer].   ROS: no chest pain, nasuea/vomiting, diaphoresis, weakness, numbness, syncope, dyspnea.   Habits & Providers  Alcohol-Tobacco-Diet     Alcohol drinks/day: <1     Alcohol type: beer      Tobacco Status: never     Diet Counseling: to improve diet; diet is suboptimal  Exercise-Depression-Behavior     Does Patient Exercise: no     Exercise Counseling: to improve exercise regimen     Type of exercise: walk     Exercise (avg: min/session): 30-60     Times/week: <3     Have you felt down or hopeless? no     Have you felt little pleasure in things? no     Depression Counseling: not indicated; screening negative for depression     Drug Use: past     Seat Belt Use: always  Current Medications (verified): 1)  Nexium 40 Mg  Cpdr  (Esomeprazole Magnesium) .Marland Kitchen.. 1 Tab By Mouth Daily 2)  Clobetasol Propionate 0.05 % Oint (Clobetasol Propionate) .... Apply To Affected Area Twice A Day Until Resolved 3)  Ferrous Sulfate 325 (65 Fe) Mg Tabs (Ferrous Sulfate) .Marland Kitchen.. 1 Tab By Mouth Two Times A Day For Iron Deficiency 4)  Colace 100 Mg Caps (Docusate Sodium) .Marland Kitchen.. 1 Tab By Mouth Two Times A Day While Taking Iron 5)  Hydroxyzine Hcl 25 Mg Tabs (Hydroxyzine Hcl) .Marland Kitchen.. 1 Tab By Mouth Daily As Needed For Itchiness  Allergies (verified): No Known Drug Allergies  Family History: Mother living, 26 OA, DJD, HTN, impaired glucose tolerance Father living, 51 DM 2 sisters with HTN 4 other siblings, all living Brother: MI age 81 Sister: small MI age 55    allergies: son, mother, sister asthma: son clotting disorders: mother rheumatism:mother   Social History: Risk analyst Use:  always Drug Use:  past  Review of Systems       per hpi   Physical Exam  General:  Well-developed,well-nourished,in no acute distress; alert,appropriate and cooperative throughout examination. Vitals reviewed.  Neck:  No deformities, masses, or tenderness noted. Lungs:  Normal respiratory effort, chest  expands symmetrically. Lungs are clear to auscultation, no crackles or wheezes. Heart:  Normal rate and regular rhythm. S1 and S2 normal without gallop, murmur, click, rub or other extra sounds. Abdomen:  Bowel sounds positive,abdomen soft and non-tender without masses, organomegaly or hernias noted. Pulses:  R and L carotid,radial,f,dorsalis pedis pulses are full and equal bilaterally Extremities:  No clubbing, cyanosis, edema, or deformity noted with normal full range of motion of all joints.   Neurologic:  alert & oriented X3.   Cervical Nodes:  No lymphadenopathy noted   Impression & Recommendations:  Problem # 1:  PALPITATIONS (ICD-785.1) Assessment New Pt describes feeling that her heart is "skipping" or "flipping".  She denies chest pain,  dysnpnea, nausea, diaphoresis.  EKG today showed NSR with no abnormal ST-T waves.  Discussed with pt that sympotms may likely be from PVCs and their benign features.  Will check basic labs to rule out thyroid, elctrolytes, anemia as source of palpitations.  If negative, we may consider Holter monitor to capture PVCs since none showed on EKG today.  Pt agreeable to plan.  Orders: Basic Met-[facility] [phone]) CBC-[facility] (25956) TSH-[facility] [id number]) Free T4-[facility] [phone]) [facility]- Est Level  3 (30160)  Complete Medication List: 1)  Nexium 40 Mg Cpdr (Esomeprazole magnesium) .Marland Kitchen.. 1 tab by mouth daily 2)  Clobetasol Propionate 0.05 % Oint (Clobetasol propionate) .... Apply to affected area twice a day until resolved 3)  Ferrous Sulfate 325 (65 Fe) Mg Tabs (Ferrous sulfate) .Marland Kitchen.. 1 tab by mouth two times a day for iron deficiency 4)  Colace 100 Mg Caps (Docusate sodium) .Marland Kitchen.. 1 tab by mouth two times a day while taking iron 5)  Hydroxyzine Hcl 25 Mg Tabs (Hydroxyzine hcl) .Marland Kitchen.. 1 tab by mouth daily as needed for itchiness 6)  Prednisone 20 Mg Tabs (Prednisone) .... 2 tabs by mouth daily x 5 days Prescriptions: PREDNISONE 20 MG TABS (PREDNISONE) 2 tabs by mouth daily x 5 days  #10 x 0   Entered and Authorized by:   Angeline Slim MD   Signed by:   Angeline Slim MD on 09/24/2010   Method used:   Electronically to        [facility]* (retail)       [address]       [address] Shipping/mailing       [place], [place]  [address]       Ph: [phone]       Fax: [phone]   RxID:   [id number] CLOBETASOL PROPIONATE 0.05 % OINT (CLOBETASOL PROPIONATE) Apply to affected area twice a day until resolved  #60g x 1   Entered and Authorized by:   Angeline Slim MD   Signed by:   Angeline Slim MD on 09/24/2010   Method used:   Electronically to        [facility]* (retail)       [address]       [address] Shipping/mailing       [place], [place]  [address]       Ph: [phone]        Fax: [phone]   RxID:   [id number]    Orders Added: 1)  Basic Met-[facility] [[id number]] 2)  CBC-[facility] [85027] 3)  TSH-[facility] [[id number]] 4)  Free T4-[facility] [[id number]] 5)  [facility]- Est Level  3 [50932]

## 2010-10-23 ENCOUNTER — Other Ambulatory Visit: Payer: Self-pay | Admitting: Family Medicine

## 2010-10-23 NOTE — Telephone Encounter (Signed)
Refill request

## 2010-10-27 ENCOUNTER — Encounter: Payer: Self-pay | Admitting: Family Medicine

## 2010-10-27 ENCOUNTER — Ambulatory Visit (INDEPENDENT_AMBULATORY_CARE_PROVIDER_SITE_OTHER): Payer: Commercial Managed Care - PPO | Admitting: Family Medicine

## 2010-10-27 VITALS — BP 123/86 | HR 80 | Ht 66.0 in | Wt 251.6 lb

## 2010-10-27 DIAGNOSIS — R002 Palpitations: Secondary | ICD-10-CM

## 2010-10-27 NOTE — Progress Notes (Signed)
  Subjective:    Patient ID: Barbara Reese, female    DOB: 01/03/1966, 45 y.o.   MRN: [id number]  HPI Pt has been experiencing "flipping" of her heart or flutter since Sep 05, 2010.  At last visit we had EKG that was NSR and dit not show and abnormal ST waves or prolonged QTc.  Pt would like to have holter monitor placed.   She feels this "flipping" intermittently.  There may be times when she does not feel this for a couple of days but then she would have it a few times per day.  When she experiences this "flip" she has an associated cough with this.    Review of Systems No fever, chills, chest pain, dyspnea, syncope.    Objective:   Physical Exam   General:  Well-developed,well-nourished,in no acute distress; alert,appropriate and cooperative throughout examination. Vitals reviewed.  Eyes:  EOMI, PERRL, Fundiscopic exam show no disc cupping or blurring. No hemmorage.  Neck:  Tender posterior left paraspinus neck muscle insertion site on skull. No bruit.  Lungs:  Normal respiratory effort, chest expands symmetrically. Lungs are clear to auscultation, no crackles or wheezes. Heart:  Normal rate and regular rhythm. S1 and S2 normal without gallop, murmur, click, rub or other extra sounds. Neurologic:  No cranial nerve deficits noted. Station and gait are normal    Assessment & Plan:

## 2010-10-27 NOTE — Assessment & Plan Note (Signed)
Pt still having these "flipping" or "flutter" or "palpitations" intermittently.  Likely PACs.  EKG showed NSR with no ST abnormalities.  TSH normal.  Although benign in nature, pt would like to be monitored.  Will place pt on Holter monitor today.   Pt was given instructions by Arlyss Repress, CNA.

## 2010-10-28 ENCOUNTER — Ambulatory Visit (INDEPENDENT_AMBULATORY_CARE_PROVIDER_SITE_OTHER): Payer: Commercial Managed Care - PPO | Admitting: *Deleted

## 2010-10-28 DIAGNOSIS — R002 Palpitations: Secondary | ICD-10-CM

## 2010-10-28 NOTE — Progress Notes (Signed)
Holter Monitor removed. Taken to hospital EKG  Dept.

## 2010-10-31 ENCOUNTER — Telehealth: Payer: Self-pay | Admitting: Family Medicine

## 2010-10-31 NOTE — Telephone Encounter (Signed)
Called patient and gave message from Dr Janalyn Harder.Busick, Rodena Medin

## 2010-10-31 NOTE — Telephone Encounter (Signed)
Patient requesting results of holter monitor

## 2010-10-31 NOTE — Telephone Encounter (Signed)
pls call with results of holter monitor

## 2010-10-31 NOTE — Telephone Encounter (Signed)
Please let pt know that the result will take a few days to  weeks to be read.  When I have the result I will call her.

## 2010-11-10 ENCOUNTER — Telehealth: Payer: Self-pay | Admitting: Family Medicine

## 2010-11-10 NOTE — Telephone Encounter (Signed)
Called pt regarding 24-Holter monitor result: 1) Sinus with occasional PVC 2) This is what we expected as to the cause of pt feeling "flips" or palpitations in her heart. 3) No new meds to take, cont to lower risk factors (weight, BP, Lipids, etc).

## 2010-12-08 LAB — POCT I-STAT, CHEM 8
BUN: 5 mg/dL — ABNORMAL LOW (ref 6–23)
Creatinine, Ser: 0.9 mg/dL (ref 0.4–1.2)
Hemoglobin: 11.6 g/dL — ABNORMAL LOW (ref 12.0–15.0)
Potassium: 3.3 mEq/L — ABNORMAL LOW (ref 3.5–5.1)
Sodium: 142 mEq/L (ref 135–145)
TCO2: 25 mmol/L (ref 0–100)

## 2010-12-08 LAB — URINALYSIS, ROUTINE W REFLEX MICROSCOPIC
Bilirubin Urine: NEGATIVE
Hgb urine dipstick: NEGATIVE
Ketones, ur: NEGATIVE mg/dL
Nitrite: NEGATIVE
Protein, ur: NEGATIVE mg/dL
Specific Gravity, Urine: 1.014 (ref 1.005–1.030)
Urobilinogen, UA: 1 mg/dL (ref 0.0–1.0)
pH: 6 (ref 5.0–8.0)

## 2010-12-08 LAB — DIFFERENTIAL
Basophils Absolute: 0 10*3/uL (ref 0.0–0.1)
Eosinophils Relative: 2 % (ref 0–5)
Lymphocytes Relative: 18 % (ref 12–46)
Lymphs Abs: 1.1 10*3/uL (ref 0.7–4.0)
Neutrophils Relative %: 75 % (ref 43–77)

## 2010-12-08 LAB — CBC
Platelets: 361 10*3/uL (ref 150–400)
RDW: 15.5 % (ref 11.5–15.5)
WBC: 6.3 10*3/uL (ref 4.0–10.5)

## 2010-12-17 ENCOUNTER — Encounter: Payer: Self-pay | Admitting: Pulmonary Disease

## 2010-12-19 ENCOUNTER — Encounter: Payer: Self-pay | Admitting: Pulmonary Disease

## 2010-12-22 ENCOUNTER — Ambulatory Visit: Payer: Commercial Managed Care - PPO | Admitting: Pulmonary Disease

## 2011-01-06 NOTE — Procedures (Signed)
Barbara Reese, Barbara Reese             ACCOUNT NO.:  [id number]   MEDICAL RECORD NO.:  [id number]          PATIENT TYPE:  OUT   LOCATION:  SLEEP CENTER                 [facility]ACILITY[facility]  [facility]   PHYSICIAN:  Clinton D. Maple Hudson, MD, FCCP, FACPDATE OF BIRTH:  01-15-66   DATE OF STUDY:  12/06/2008                            NOCTURNAL POLYSOMNOGRAM   REFERRING PHYSICIAN:  Norton Blizzard, M.D.   INDICATION FOR STUDY:  Insomnia with sleep apnea.   EPWORTH SLEEPINESS SCORE:  Epworth sleepiness score  4/24, BMI 37.1,  weight 230 pounds, height 66 inches, and neck 14.5 inches.   HOME MEDICATIONS:  Charted and reviewed.   SLEEP ARCHITECTURE:  Total sleep time 348 minutes with sleep efficiency  81.9%.  Stage I was 8.3%, stage II 79.9%, stage III absent, REM 11.8% of  total sleep time.  Sleep latency 18 minutes, REM latency 247 minutes,  awake after sleep onset 59 minutes, arousal index 30.6.  Bedtime  medication included zolpidem.   RESPIRATORY DATA:  Apnea/hypopnea index (AHI) 1 per hour.  Total of 6  apneas and hypopneas were scored including 2 central apneas and 4  hypopneas with nonpositional events and REM AHI of 5.9 per hour.  An  additional 88 respiratory events related to arousal, but not meeting  duration and oxygen desaturation criteria to be scored as apneas or  hypopneas, lead to an overall respiratory disturbance index of 16.2 per  hour.  There were insufficient apneas and hypopneas to permit CPAP  titration by split protocol on the study night.   OXYGEN DATA:  Moderate snoring with oxygen desaturation to a nadir of  89%.  Mean oxygen saturation through the study was 97.6% on room air.   CARDIAC DATA:  Normal sinus rhythm.   MOVEMENT/PARASOMNIA:  A total of 8 limb jerks were counted of which 3  were associated with arousal or awakening for periodic limb movement  with arousal index of 0.5 per hour, which is probably insignificant.   IMPRESSION/RECOMMENDATIONS:  1. Moderate obstructive  sleep apnea/hypopnea syndrome by the broader      criteria of respiratory disturbance index events (RDI 16.2 per      hour).  Scoring apneas and hypopneas gave an index of 1 per hour      (normal range 0-5 per hour).  Moderate snoring with oxygen      desaturation to a nadir of 89%.  2. Based on the respiratory disturbance index, consider return for      continuous positive airway pressure titration or evaluate for      alternative management as indicated.      Clinton D. Maple Hudson, MD, [facility], FACP  Diplomate, Biomedical engineer of Sleep Medicine  Electronically Signed     CDY/MEDQ  D:  12/08/2008 12:31:08  T:  12/09/2008 03:13:25  Job:  [id number]

## 2011-01-09 NOTE — Op Note (Signed)
[place]. [facility]  Patient:    Barbara Reese, Barbara Reese Visit Number: [id number] MRN: [id number]          Service Type: DSU Location: [facility] Attending Physician:  Colbert Ewing Dictated by:   Loreta Ave, M.D. Proc. Date: 06/16/01 Admit Date:  06/16/2001 Discharge Date: 06/16/2001                             Operative Report  PREOPERATIVE DIAGNOSES: 1. Chondromalacia patella, with lateral patellofemoral tracking. 2. Medical meniscus tear, left knee.  POSTOPERATIVE DIAGNOSES: 1. Chondromalacia patella, with lateral patellofemoral tracking. 2. Medial meniscus tear, left knee. 3. Lateral meniscus tear, left knee.  PROCEDURES: 1. Left knee examination under anesthesia arthroscopy. 2. Partial medial and lateral meniscectomy. 3. Extensive chondroplasty of the patellofemoral joint. 4. Arthroscopic lateral retinacular release.  SURGEON:  Loreta Ave, M.D.  ASSISTANT:  Arlys John D. Petrarca, P.A.-C.  ANESTHESIA:  Local followed by general.  SPECIMENS:  None.  CULTURES:  None.  COMPLICATIONS:  None.  DRESSINGS:  Soft compressive.  DESCRIPTION OF PROCEDURE:  The patient is brought to the operating room and placed on the operating table in supine position.  After adequate anesthesia had been obtained, tourniquet and leg holder applied; the patients left leg was prepped and draped in the usual sterile fashion.  Three port holes were created, one superolateral and one each medial and lateral to the parapatellar.  Examination prior to that revealed full motion and stable knee, but lateral tracking and tethering in the patellofemoral joint, with crepitus. Otherwise stable ligaments.  In-flow catheter introduced via the standard. Arthroscope was introduced view inspected.  Marked fibrillated grade 3 chondromalacia, most marked laterally but over the middle part of the patella as well.  Marked lateral tracking and tethering.  Extensive  chondroplasty to a smooth, stable surface throughout.  Medial meniscus had extensive tearing of the posterior half.  Detached posterior ligament with marked intrameniscal tearing over the entire posterior half.  Posterior half removed, __________ remaining meniscus, salvaging the anterior half.  Cruciate ligament was intact.  Lateral compartment noted degenerative changes, but degenerative fraying centrally and in the anterior third.  Saucerized out, tapered down smoothly with the shaver.  Entire knee examined.  No other findings appreciated.  Procedure with lateral release utilizing cautery; released from the vastus lateralis superolaterally, all the way down to the lateral joint line.   Marked improvement of tethering and virtually normal tracking after that was accomplished, when viewed with multiple portals.  Instruments and fluid removed.  Portals and knee injected with Marcaine.  Portals were closed with 4-0 nylon.  A sterile, compressive dressing applied with lateral bolster. Anesthesia reversed.  Patient brought to recovery room.  She tolerated the procedure well with no complications. Dictated by:   Loreta Ave, M.D. Attending Physician:  Colbert Ewing DD:  06/16/01 TD:  06/19/01 Job: [id number]

## 2011-01-09 NOTE — Procedures (Signed)
[place]. [facility]  Patient:    ELLER, SWEIS                    MRN: [id number] Proc. Date: 04/27/00 Adm. Date:  [id number] Attending:  Nelda Marseille CC:         Talmage Nap, M.D.                           Procedure Report  PROCEDURE:  Esophagogastroduodenoscopy with biopsy.  INDICATIONS:  Persistent upper track symptoms.  Want to evaluate for possible hiatal hernia surgery.  INFORMED CONSENT:  Consent was signed after risk, benefits, methods and options were thoroughly discussed multiple times in the past.  MEDICINES USED:  Demerol 80 mg, Versed 6 mg.  DESCRIPTION OF PROCEDURE:  The video endoscope was inserted by direct vision. Esophagus was slightly dilated but no obvious signs of reflux or Barretts were seen.  Scope passed into the stomach and passed possibly a tiny hiatal hernia and advanced through a normal antrum, normal pylorus into a normal duodenal bulb around the C-loop to a normal second portion of the duodenum.  The scope was withdrawn back to the bulb and a good look there ruled out abnormality ______.  The scope was drawn back to the stomach and retroflexed. Angularis, cardia, fundus, lesser and greater curve were all normal on retroflexed visualization.  Scope was straightened and straight visualization into the stomach was normal.  The scope was then slowly withdrawn back to about 20 cm, which confirmed the above findings in the esophagus.  The scope was advanced to the antrum, 1 biopsy for the Clo-test to rule out H. pylori was obtained.  Air was suctioned, the scope then slowly withdrawn. A few scattered biopsies in the distal esophagus were obtained to rule out any microscopic abnormalities.  The scope was further withdrawn.  The remaining part of the esophagus was normal as mentioned above.  Scope was removed.  The patient tolerated the procedure well.  There was no obvious immediate complication.  ENDOSCOPIC  DIAGNOSIS: 1. Questionable tiny hiatal hernia. 2. Normal esophagus status post distal biopsy except for some mild    dilation possibly. 3. Otherwise normal EGD status post Clo-test biopsy of the stomach.  PLAN: 1. Nexium every day.. 2. Follow up in two months to recheck symptoms and see if any further work-up    plans are needed. 3. Call sooner p.r.n. 4. Will await biopsies.  Might consider a barium swallow to confirm the hiatal    hernia and see if we need to proceed with a discussion about hiatal hernia    surgery but would need a menometry prior to that just to make sure    esophagus is functioning adequately and have to see back sooner p.r.n. DD:  04/27/00 TD:  04/28/00 Job: [id number]

## 2011-02-13 ENCOUNTER — Ambulatory Visit: Payer: Commercial Managed Care - PPO | Admitting: Family Medicine

## 2011-02-24 ENCOUNTER — Encounter: Payer: Self-pay | Admitting: Family Medicine

## 2011-02-24 ENCOUNTER — Ambulatory Visit (INDEPENDENT_AMBULATORY_CARE_PROVIDER_SITE_OTHER): Payer: Commercial Managed Care - PPO | Admitting: Family Medicine

## 2011-02-24 VITALS — BP 132/90 | HR 90 | Temp 99.4°F | Ht 66.0 in | Wt 254.0 lb

## 2011-02-24 DIAGNOSIS — L439 Lichen planus, unspecified: Secondary | ICD-10-CM

## 2011-02-24 DIAGNOSIS — L430 Hypertrophic lichen planus: Secondary | ICD-10-CM

## 2011-02-24 NOTE — Patient Instructions (Signed)
Take zyrtec as needed for itching. Follow up with dermatologist as scheduled.  Use local anesthetic pramoxine anti itch cream.

## 2011-02-24 NOTE — Progress Notes (Signed)
  Subjective:    Patient ID: Barbara Reese, female    DOB: 02/05/1966, 45 y.o.   MRN: [id number]  HPI  Skin problem: Has had rash on hands, ankles, feet, back and abdomen.  Has had off and on x 2 years.  Has had  3 derm appointments to evaluated this issue.  Has had 2 biopsies both showing inflammation.  Told by last dermatologist that this was most likely lichen planus.   Has used topical steroids off and on x 2 years.  This seems to be a new flare.  The lesions start out as blisters than get dark in color, sometimes they turn into an ulcer.  Currently using clobetasol that was prescribed at last derm visit.   + itching.   Review of Systems no fever, no n/v/d, no skin infections, no mouth ulcers Objective:   Physical Exam  Constitutional: She appears well-developed and well-nourished.  Cardiovascular: Normal rate.   Pulmonary/Chest: Effort normal. No respiratory distress.  Skin:       Hands- few scattered blisters approx 3mm in size on wrists.  Feet/ankles- Some blisters approx 2-12mm in size on feet.  On ankles, tops of feet few scattered hyperpigmented, scaley lesions approx 1-4cm in size.  Some have blisters within the hyperpigmented lesion others have some ulceration.    Arms/back/stomach- Scattered circular hyperpigmented macules. (pt states these are sites of old lesions)          Assessment & Plan:

## 2011-02-26 DIAGNOSIS — L430 Hypertrophic lichen planus: Secondary | ICD-10-CM | POA: Insufficient documentation

## 2011-02-26 HISTORY — DX: Hypertrophic lichen planus: L43.0

## 2011-02-26 NOTE — Assessment & Plan Note (Signed)
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  Although pt doubts this diagnosis.  On review of literature photos of lesions of hypertrophicus lesions were very similar to the lesions on pt's ankles.  Told pt to take zyrtec for itching.  Pt is to apply OTC antiitch cream as needed. Pt is to continue to use clobetasol cream as directed.  If no improvement in symptoms could consider po steroids.  Will call derm office and see if we can get pt's appointment moved to a sooner date.   I examined the rash with preceptor and preceptor was in agreement with this plan.  Visit lasted approx 30 min and over 1/2 of this time was spent counseling patient.

## 2011-03-05 ENCOUNTER — Telehealth: Payer: Self-pay | Admitting: Family Medicine

## 2011-03-05 NOTE — Telephone Encounter (Signed)
Please contact pt asap regarding referral to dermatologist office. She need this as soon as possible.  You can contact her at work until 3:00 .

## 2011-03-05 NOTE — Telephone Encounter (Signed)
Called pt and also called Dr.Gruber's office. Spoke with Herbert Seta and they will call her, if they have a cancellation. Pt has appt 8-20 and Dr.Gruber is booked up until Oct 2012. Lorenda Hatchet, [name]

## 2011-04-23 ENCOUNTER — Telehealth: Payer: Self-pay | Admitting: Family Medicine

## 2011-04-23 NOTE — Telephone Encounter (Signed)
Please complete form for Ms. Huebert it is for part time employment.  They need to know if she is physically able to participate.  Please fax back to ext [phone] or call  [phone] to pick up

## 2011-04-24 NOTE — Telephone Encounter (Signed)
Form completed and placed in the "to be faxed" file

## 2011-05-19 LAB — CBC
HCT: 37.3
Hemoglobin: 12.5
RDW: 14.8

## 2011-05-19 LAB — DIFFERENTIAL
Basophils Absolute: 0.1
Eosinophils Relative: 2
Lymphocytes Relative: 19
Lymphs Abs: 1.5
Monocytes Absolute: 0.5

## 2011-05-19 LAB — POCT I-STAT, CHEM 8
BUN: 7
Calcium, Ion: 1.19
Creatinine, Ser: 1
TCO2: 25

## 2011-07-05 ENCOUNTER — Emergency Department (HOSPITAL_COMMUNITY)
Admission: EM | Admit: 2011-07-05 | Discharge: 2011-07-05 | Disposition: A | Discharge status: Home / Self Care | Payer: 59 | Attending: Emergency Medicine | Admitting: Emergency Medicine

## 2011-07-05 ENCOUNTER — Emergency Department (HOSPITAL_COMMUNITY): Payer: 59

## 2011-07-05 ENCOUNTER — Encounter (HOSPITAL_COMMUNITY): Payer: Self-pay | Admitting: *Deleted

## 2011-07-05 DIAGNOSIS — M25562 Pain in left knee: Secondary | ICD-10-CM

## 2011-07-05 DIAGNOSIS — M25569 Pain in unspecified knee: Secondary | ICD-10-CM | POA: Insufficient documentation

## 2011-07-05 DIAGNOSIS — IMO0002 Reserved for concepts with insufficient information to code with codable children: Secondary | ICD-10-CM | POA: Insufficient documentation

## 2011-07-05 DIAGNOSIS — M1710 Unilateral primary osteoarthritis, unspecified knee: Secondary | ICD-10-CM | POA: Insufficient documentation

## 2011-07-05 DIAGNOSIS — Z9889 Other specified postprocedural states: Secondary | ICD-10-CM | POA: Insufficient documentation

## 2011-07-05 MED ORDER — NAPROXEN 500 MG PO TABS: 500.0000 mg | ORAL_TABLET | Freq: Two times a day (BID) | ORAL | Status: DC

## 2011-07-05 MED ORDER — TRAMADOL HCL 50 MG PO TABS: 50.0000 mg | ORAL_TABLET | Freq: Four times a day (QID) | ORAL | Status: AC | PRN

## 2011-07-05 MED ORDER — TRAMADOL HCL 50 MG PO TABS: 50.0000 mg | ORAL_TABLET | Freq: Four times a day (QID) | ORAL | Status: DC | PRN

## 2011-07-05 NOTE — ED Notes (Signed)
Pt states she fell down some steps about a month ago landing on concrete, continues to have left knee pain and swelling, difficult to ambulate

## 2011-07-05 NOTE — ED Provider Notes (Signed)
History     CSN: [id number] Arrival date & time: 07/05/2011  3:16 PM   First MD Initiated Contact with Patient 07/05/11 1822      Chief Complaint  Patient presents with  . Knee Injury  . Joint Swelling    (Consider location/radiation/quality/duration/timing/severity/associated sxs/prior treatment) Patient is a 45 y.o. female presenting with knee pain. The history is provided by the patient.  Knee Pain This is a new problem. The current episode started 1 to 4 weeks ago. The problem occurs constantly. The problem has been unchanged. Associated symptoms include joint swelling. Pertinent negatives include no abdominal pain, arthralgias, chest pain, chills, congestion, coughing, fever, headaches, myalgias, nausea, neck pain, numbness, sore throat or weakness. The symptoms are aggravated by bending, standing, stress, walking and twisting. She has tried NSAIDs for the symptoms. The treatment provided no relief.  pt states she fell down 4 steps a month ago. Since then pain in the left knee, popping sensation, not improving. States hx of surgery to that knee, had lateral release done and a scope several years ago.   Past Medical History  Diagnosis Date  . Anemia   . Allergic rhinitis   . Depressive disorder   . Insomnia   . Osteoarthritis   . GERD (gastroesophageal reflux disease)   . OSA (obstructive sleep apnea)     Past Surgical History  Procedure Date  . Cholecystectomy 1987  . Knee surgery 2004    left knee- Dr. Eulah Pont    Family History  Problem Relation Age of Onset  . Osteoarthritis Mother   . Hypertension Mother   . Diabetes Father   . Hypertension Sister   . Heart attack Brother   . Heart attack Sister   . Allergies Mother   . Allergies Son   . Allergies Sister   . Asthma Son   . Clotting disorder Sister   . Rheum arthritis Mother     History  Substance Use Topics  . Smoking status: Not on file  . Smokeless tobacco: Never Used  . Alcohol Use: Yes    OB  History    Grav Para Term Preterm Abortions TAB SAB Ect Mult Living                  Review of Systems  Constitutional: Negative for fever and chills.  HENT: Negative for congestion, sore throat and neck pain.   Respiratory: Negative for cough.   Cardiovascular: Negative for chest pain.  Gastrointestinal: Negative for nausea and abdominal pain.  Musculoskeletal: Positive for joint swelling. Negative for myalgias and arthralgias.  Neurological: Negative for weakness, numbness and headaches.  All other systems reviewed and are negative.    Allergies  Review of patient's allergies indicates no known allergies.  Home Medications   Current Outpatient Rx  Name Route Sig Dispense Refill  . BETAMETHASONE DIPROPIONATE 0.05 % EX CREA Topical Apply topically 2 (two) times daily.      Marland Kitchen CLOBETASOL PROPIONATE 0.05 % EX OINT Topical Apply topically 2 (two) times daily.      Marland Kitchen DOCUSATE SODIUM 100 MG PO CAPS Oral Take 100 mg by mouth 2 (two) times daily.      Marland Kitchen FERROUS SULFATE 325 (65 FE) MG PO TABS Oral Take 325 mg by mouth 2 (two) times daily.      Marland Kitchen NEXIUM 40 MG PO CPDR  TAKE 1 CAPSULE BY MOUTH ONCE DAILY 90 capsule 3    BP 130/91  Pulse 68  Temp(Src) 98.8 F (37.1  C) (Oral)  Resp 18  SpO2 98%  Physical Exam  Constitutional: She is oriented to person, place, and time. She appears well-developed and well-nourished.  HENT:  Head: Normocephalic and atraumatic.  Neck: Neck supple.  Cardiovascular: Normal rate, regular rhythm and normal heart sounds.   Pulmonary/Chest: Effort normal and breath sounds normal. No respiratory distress.  Abdominal: Soft. Bowel sounds are normal. She exhibits no distension.  Musculoskeletal:       Normal appearing left knee. Tender to palpation over medial and lateral joint. Full ROM. Negative anterior or posterior drawer signs. No pain or laxaty with medial or lateral stress.  Neurological: She is alert and oriented to person, place, and time.  Skin: Skin  is warm and dry.  Psychiatric: She has a normal mood and affect.    ED Course  Procedures (including critical care time)  Labs Reviewed - No data to display Dg Knee Complete 4 Views Left  07/05/2011  *RADIOLOGY REPORT*  Clinical Data: Left knee pain secondary to a fall.  LEFT KNEE - COMPLETE 4+ VIEW  Comparison: None.  Findings: There is mild to moderate osteoarthritis of the patellofemoral and medial compartments.  No fractures or dislocations.  No appreciable joint effusion.  IMPRESSION: Osteoarthritic changes.  Original Report Authenticated By: Gwynn Burly, M.D.     Negative x-ray. Will d/c home with pain meds and follow up with orthopedics. Suspect possible meniscal injury vs arthirits.   MDM          Lottie Mussel, PA 07/05/11 [phone]

## 2011-07-06 NOTE — ED Provider Notes (Signed)
Medical screening examination/treatment/procedure(s) were performed by non-physician practitioner and as supervising physician I was immediately available for consultation/collaboration.   Gwyneth Sprout, MD 07/06/11 [phone]

## 2011-07-31 ENCOUNTER — Emergency Department (HOSPITAL_COMMUNITY)
Admission: EM | Admit: 2011-07-31 | Discharge: 2011-07-31 | Disposition: A | Discharge status: Home / Self Care | Payer: 59 | Source: Home / Self Care | Attending: Family Medicine | Admitting: Family Medicine

## 2011-07-31 DIAGNOSIS — J069 Acute upper respiratory infection, unspecified: Secondary | ICD-10-CM

## 2011-07-31 MED ORDER — GUAIFENESIN-CODEINE 100-10 MG/5ML PO SYRP: 10.0000 mL | ORAL_SOLUTION | Freq: Three times a day (TID) | ORAL | Status: AC | PRN

## 2011-07-31 MED ORDER — IPRATROPIUM BROMIDE 0.06 % NA SOLN: 2.0000 | Freq: Four times a day (QID) | NASAL | Status: DC

## 2011-07-31 NOTE — ED Notes (Signed)
Pt reports sore throat, cough, headache, body aches, and bilat ear ache. Started yesterday. Denies fever at home. Taking robitussin DM Max and alkaselzer cold and cough with no relief.

## 2011-07-31 NOTE — ED Provider Notes (Signed)
History     CSN: [id number] Arrival date & time: 07/31/2011  9:06 AM   First MD Initiated Contact with Patient 07/31/11 [phone]      Chief Complaint  Patient presents with  . Cough  . Sore Throat  . Headache  . Otalgia    (Consider location/radiation/quality/duration/timing/severity/associated sxs/prior treatment) Patient is a 44 y.o. female presenting with cough.  Cough This is a new problem. The current episode started yesterday. The problem has been gradually worsening. The cough is productive of brown sputum. There has been no fever. Associated symptoms include chills, rhinorrhea, sore throat and myalgias. She is not a smoker.    Past Medical History  Diagnosis Date  . Anemia   . Allergic rhinitis   . Depressive disorder   . Insomnia   . Osteoarthritis   . GERD (gastroesophageal reflux disease)   . OSA (obstructive sleep apnea)     Past Surgical History  Procedure Date  . Cholecystectomy 1987  . Knee surgery 2004    left knee- Dr. Eulah Pont    Family History  Problem Relation Age of Onset  . Osteoarthritis Mother   . Hypertension Mother   . Diabetes Father   . Hypertension Sister   . Heart attack Brother   . Heart attack Sister   . Allergies Mother   . Allergies Son   . Allergies Sister   . Asthma Son   . Clotting disorder Sister   . Rheum arthritis Mother     History  Substance Use Topics  . Smoking status: Not on file  . Smokeless tobacco: Never Used  . Alcohol Use: Yes    OB History    Grav Para Term Preterm Abortions TAB SAB Ect Mult Living                  Review of Systems  Constitutional: Positive for chills. Negative for fever.  HENT: Positive for congestion, sore throat, rhinorrhea and postnasal drip.   Respiratory: Positive for cough.   Gastrointestinal: Negative.   Musculoskeletal: Positive for myalgias.    Allergies  Review of patient's allergies indicates no known allergies.  Home Medications   Current Outpatient Rx  Name  Route Sig Dispense Refill  . CLOBETASOL PROPIONATE 0.05 % EX OINT Topical Apply topically 2 (two) times daily.      Marland Kitchen DOCUSATE SODIUM 100 MG PO CAPS Oral Take 100 mg by mouth 2 (two) times daily.      Marland Kitchen FERROUS SULFATE 325 (65 FE) MG PO TABS Oral Take 325 mg by mouth 2 (two) times daily.      Marland Kitchen NAPROXEN 500 MG PO TABS Oral Take 1 tablet (500 mg total) by mouth 2 (two) times daily. 30 tablet 0  . NEXIUM 40 MG PO CPDR  TAKE 1 CAPSULE BY MOUTH ONCE DAILY 90 capsule 3  . BETAMETHASONE DIPROPIONATE 0.05 % EX CREA Topical Apply topically 2 (two) times daily.      . GUAIFENESIN-CODEINE 100-10 MG/5ML PO SYRP Oral Take 10 mLs by mouth 3 (three) times daily as needed for cough. 180 mL 0  . IPRATROPIUM BROMIDE 0.06 % NA SOLN Nasal Place 2 sprays into the nose 4 (four) times daily. 15 mL 12    BP 129/73  Pulse 89  Temp(Src) 100.3 F (37.9 C) (Oral)  Resp 22  SpO2 100%  LMP 07/09/2011  Physical Exam  Nursing note and vitals reviewed. Constitutional: She is oriented to person, place, and time. She appears well-developed and well-nourished.  HENT:  Head: Normocephalic.  Right Ear: External ear normal.  Left Ear: External ear normal.  Mouth/Throat: Oropharynx is clear and moist.  Eyes: Conjunctivae are normal. Pupils are equal, round, and reactive to light.  Neck: Normal range of motion. Neck supple.  Cardiovascular: Normal rate, normal heart sounds and intact distal pulses.   Pulmonary/Chest: Effort normal and breath sounds normal.  Abdominal: Soft. Bowel sounds are normal.  Lymphadenopathy:    She has no cervical adenopathy.  Neurological: She is alert and oriented to person, place, and time.  Skin: Skin is warm and dry.    ED Course  Procedures (including critical care time)  Labs Reviewed - No data to display No results found.   1. Acute URI       MDM          Barkley Bruns, MD 07/31/11 [phone]

## 2011-08-06 ENCOUNTER — Ambulatory Visit (INDEPENDENT_AMBULATORY_CARE_PROVIDER_SITE_OTHER): Payer: 59 | Admitting: Family Medicine

## 2011-08-06 ENCOUNTER — Ambulatory Visit: Payer: 59 | Admitting: Family Medicine

## 2011-08-06 DIAGNOSIS — J111 Influenza due to unidentified influenza virus with other respiratory manifestations: Secondary | ICD-10-CM

## 2011-08-06 DIAGNOSIS — J069 Acute upper respiratory infection, unspecified: Secondary | ICD-10-CM | POA: Insufficient documentation

## 2011-08-06 MED ORDER — BENZONATATE 100 MG PO CAPS: 100.0000 mg | ORAL_CAPSULE | Freq: Four times a day (QID) | ORAL | Status: DC | PRN

## 2011-08-06 NOTE — Progress Notes (Signed)
  Subjective:    Patient ID: Barbara Reese, female    DOB: 05-08-1966, 45 y.o.   MRN: [id number]  HPI Diagnosed with the flu at Urgent Care last Thursday (11/06).  Still complaining of thick yellow sputum, runny nose, muscle aches but feels like symptoms started to improve yesterday.  Here today because yesterday, had 2 episodes of blood in nasal discharge. No frank blood but more than just blood-tinged. No more episodes since yesterday.   Took off work Monday and Tuesday but had to return to work past couple of days. Reports cannot afford to take more time off of work.   Review of Systems Some sinus tenderness. No recent fevers.  Tolerating PO okay.     Objective:   Physical Exam Gen: appears mildly uncomfortable Psych: appropriate HEENT   Head: mild maxillary sinus tenderness to palpation    Eyes: normal conjunctiva   Ears: TM clear bilaterally without erythema or effusion   Nose: nasal congestion, clear discharge   Throat: mild tonsillar adenopathy without exudates; no oropharyngeal erythema   Neck: no LAD Pulm: no increased WOB, CTAB without w/r/r Ext: warm, 2-3 sec capillary refill    Assessment & Plan:

## 2011-08-06 NOTE — Patient Instructions (Signed)
Your symptoms should improve in the next few days.  Try the Tessalon for the cough.  Continue to use Motrin/Tylenol as needed for fevers, nasal saline nose drops, cough medicine.  Try to get plenty of sleep at night and drink plenty of fluids.  If you have fevers (temperature > 100.3) please let me know.  If your symptoms are not improved by early next week (Monday), please let me know, and we can discuss starting an antibiotic at that time.

## 2011-08-06 NOTE — Assessment & Plan Note (Addendum)
Diagnosed with influenza at Urgent Care 1 week ago. Symptoms seem to be improving but persistent thick sputum and nasal congestion bothersome to patient. 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 Will give Tessalon for cough, encouraged to continue to use humidifier, NSAIDs/Tylenol prn, hydration. Patient has some sinus tenderness but she reports feeling better and has been afebrile recently so will hold off on antibiotics for now. If symptoms not improved by next week or patient has recurring fevers, will consider starting antibiotic at that time. Patient advised to call and let me know.

## 2011-08-11 ENCOUNTER — Telehealth: Payer: Self-pay | Admitting: Family Medicine

## 2011-08-11 MED ORDER — AMOXICILLIN-POT CLAVULANATE 875-125 MG PO TABS: 1.0000 | ORAL_TABLET | Freq: Two times a day (BID) | ORAL | Status: AC

## 2011-08-11 NOTE — Telephone Encounter (Signed)
Pt seen last week and was told to call back if not better, is asking for abx, goes to outpt pharmacy.

## 2011-08-11 NOTE — Telephone Encounter (Signed)
Called pt. Barbara Reese, Barbara Reese

## 2011-08-11 NOTE — Telephone Encounter (Signed)
Will fwd. To Dr.OhPark .Arlyss Repress

## 2011-08-11 NOTE — Telephone Encounter (Signed)
Please notify patient that Rx for antibiotic (Augmentin) has been sent.  Thank you Thekla.

## 2011-10-07 ENCOUNTER — Other Ambulatory Visit: Payer: Self-pay | Admitting: Family Medicine

## 2011-10-07 DIAGNOSIS — Z1231 Encounter for screening mammogram for malignant neoplasm of breast: Secondary | ICD-10-CM

## 2011-10-08 ENCOUNTER — Ambulatory Visit (HOSPITAL_COMMUNITY)
Admission: RE | Admit: 2011-10-08 | Discharge: 2011-10-08 | Disposition: A | Discharge status: Home / Self Care | Payer: 59 | Source: Ambulatory Visit | Attending: Family Medicine | Admitting: Family Medicine

## 2011-10-08 DIAGNOSIS — Z1231 Encounter for screening mammogram for malignant neoplasm of breast: Secondary | ICD-10-CM | POA: Insufficient documentation

## 2011-10-30 ENCOUNTER — Encounter: Payer: 59 | Admitting: Family Medicine

## 2011-11-04 ENCOUNTER — Encounter: Payer: 59 | Admitting: Family Medicine

## 2011-11-06 ENCOUNTER — Encounter: Payer: 59 | Admitting: Family Medicine

## 2011-11-12 ENCOUNTER — Other Ambulatory Visit (HOSPITAL_COMMUNITY)
Admission: RE | Admit: 2011-11-12 | Discharge: 2011-11-12 | Disposition: A | Discharge status: Home / Self Care | Payer: 59 | Source: Ambulatory Visit | Attending: Family Medicine | Admitting: Family Medicine

## 2011-11-12 DIAGNOSIS — Z01419 Encounter for gynecological examination (general) (routine) without abnormal findings: Secondary | ICD-10-CM | POA: Insufficient documentation

## 2011-11-13 ENCOUNTER — Encounter: Payer: Self-pay | Admitting: Family Medicine

## 2011-11-13 ENCOUNTER — Ambulatory Visit (INDEPENDENT_AMBULATORY_CARE_PROVIDER_SITE_OTHER): Payer: 59 | Admitting: Family Medicine

## 2011-11-13 VITALS — BP 131/88 | HR 81 | Temp 98.1°F | Ht 66.0 in | Wt 257.6 lb

## 2011-11-13 DIAGNOSIS — F3289 Other specified depressive episodes: Secondary | ICD-10-CM

## 2011-11-13 DIAGNOSIS — E669 Obesity, unspecified: Secondary | ICD-10-CM

## 2011-11-13 DIAGNOSIS — F32A Depression, unspecified: Secondary | ICD-10-CM

## 2011-11-13 DIAGNOSIS — F329 Major depressive disorder, single episode, unspecified: Secondary | ICD-10-CM

## 2011-11-13 DIAGNOSIS — Z0000 Encounter for general adult medical examination without abnormal findings: Secondary | ICD-10-CM

## 2011-11-13 MED ORDER — BUPROPION HCL ER (XL) 150 MG PO TB24: 150.0000 mg | ORAL_TABLET | Freq: Every day | ORAL | Status: DC

## 2011-11-13 NOTE — Patient Instructions (Addendum)
depression: Will go ahead and restart wellbutrin - return in 6 weeks for recheck. Or sooner if needed.   Health screenings: I will send you a letter with your lab and pap results.  So glad to hear that your mammogram was normal.  Left abd pain transient: Keep a log book of abdominal pain symptoms.  Return in 6 weeks for recheck.   Weight management: Think about what your goals are for diet and exercise. At your next appointment discuss these goals in more detail.

## 2011-11-13 NOTE — Progress Notes (Signed)
  Subjective:    Patient ID: Barbara Reese, female    DOB: 05-30-1966, 46 y.o.   MRN: [id number]  HPI Patient is here for a complete physical exam:  Past medical history, surgical history, family history, medications, allergies, social history all updated and are appropriate.  Patient works at [employer]. as Diplomatic Services operational officer at [employer].  Health maintenance screening: Patient is up-to-date on mammogram-this was normal this past month. Patient due for Pap smear. Has never had history of abnormal. Family history of diabetes-patient agrees to diabetes screen. Patient unable to come in for fasting labs due to work schedule-agrees to have LDL screen for cholesterol.  Depression: Patient reports that more than half of the days during the past 2 weeks she has had little pleasure or interest in doing things, feels down or hopeless, trouble falling or staying asleep, poor appetite her over eating, feeling bad about herself, and having trouble with concentration. New every day feels tired her little energy. No suicide or homicidal ideation.  Patient states that she feels she is not herself. Would like to go back on Wellbutrin which she has done well on in the past. Has a history of depression.    Review of Systems As per above.    Objective:   Physical Exam  Constitutional: She appears well-developed and well-nourished. No distress.  HENT:  Head: Normocephalic and atraumatic.  Mouth/Throat: Oropharynx is clear and moist. No oropharyngeal exudate.  Eyes: EOM are normal. Pupils are equal, round, and reactive to light. Right eye exhibits no discharge. Left eye exhibits no discharge.  Neck: Normal range of motion. No thyromegaly present.  Cardiovascular: Normal rate, regular rhythm and normal heart sounds.   No murmur heard. Pulmonary/Chest: Effort normal and breath sounds normal. No respiratory distress. She has no wheezes. She has no rales.  Abdominal: Soft. She exhibits no distension. There is  no tenderness. There is no rebound and no guarding.  Genitourinary: Vagina normal and uterus normal. There is no rash, tenderness, lesion or injury on the right labia. There is no rash, tenderness, lesion or injury on the left labia. Cervix exhibits no motion tenderness, no discharge and no friability. Right adnexum displays no mass, no tenderness and no fullness. Left adnexum displays no mass, no tenderness and no fullness.  Musculoskeletal: She exhibits no edema.  Neurological: She is alert.  Skin: No rash noted.  Psychiatric: She has a normal mood and affect.   PHQ-9 score of 15       Assessment & Plan:

## 2011-11-14 ENCOUNTER — Encounter: Payer: Self-pay | Admitting: Family Medicine

## 2011-11-14 DIAGNOSIS — F32A Depression, unspecified: Secondary | ICD-10-CM | POA: Insufficient documentation

## 2011-11-14 DIAGNOSIS — F329 Major depressive disorder, single episode, unspecified: Secondary | ICD-10-CM | POA: Insufficient documentation

## 2011-11-14 DIAGNOSIS — Z0000 Encounter for general adult medical examination without abnormal findings: Secondary | ICD-10-CM | POA: Insufficient documentation

## 2011-11-14 LAB — LDL CHOLESTEROL, DIRECT: Direct LDL: 123 mg/dL — ABNORMAL HIGH

## 2011-11-14 NOTE — Assessment & Plan Note (Signed)
Pt to work on developing exercise and nutrition goals.  Will discuss in more detail at follow up visit in 4-5 weeks. Will recheck weight at that time.

## 2011-11-14 NOTE — Assessment & Plan Note (Signed)
Patient is up-to-date on mammogram-this was normal this past month. Patient due for Pap smear. Has never had history of abnormal. Performed today. Family history of diabetes-patient agrees to diabetes screen. Patient unable to come in for fasting labs due to work schedule-agrees to have LDL screen for cholesterol.

## 2011-11-14 NOTE — Assessment & Plan Note (Signed)
Pt PHQ9 score of 15.  No SI or HI.  States she would like to go back on wellbutrin since this seemed to help her in the past.  Reviewed common side effects with patient and rx sent in for wellbutrin.  Pt to return in 4-6 weeks for recheck on this medication.  Pt to take 150mg  x 4 days then can titrate up to 300mg  daily.  Pt able to teach back this dose titration.

## 2011-11-18 ENCOUNTER — Encounter: Payer: Self-pay | Admitting: Family Medicine

## 2011-11-19 ENCOUNTER — Telehealth: Payer: Self-pay | Admitting: Family Medicine

## 2011-11-19 NOTE — Telephone Encounter (Signed)
Patient is calling for her lab results.  I notified her of the 2 letters that have and are going out, but she would like Dr. Edmonia James to explain the blood work to her further.

## 2011-11-23 NOTE — Telephone Encounter (Signed)
Attempted to call but unfortunately no answer. If patient calls back please tell her that cholesterol is minorly elevated but this could be controlled with diet and exercise. A1c at 6.1 which puts her on the borderline of diabetes. Patient wants I would give metformin. If patient is not to comfortable with this she can wait to primary care provider returns.

## 2011-12-08 ENCOUNTER — Emergency Department (INDEPENDENT_AMBULATORY_CARE_PROVIDER_SITE_OTHER): Payer: 59

## 2011-12-08 ENCOUNTER — Ambulatory Visit: Payer: 59 | Admitting: Sports Medicine

## 2011-12-08 ENCOUNTER — Encounter (HOSPITAL_COMMUNITY): Payer: Self-pay | Admitting: Emergency Medicine

## 2011-12-08 ENCOUNTER — Ambulatory Visit: Payer: 59 | Admitting: Family Medicine

## 2011-12-08 ENCOUNTER — Emergency Department (INDEPENDENT_AMBULATORY_CARE_PROVIDER_SITE_OTHER)
Admission: EM | Admit: 2011-12-08 | Discharge: 2011-12-08 | Disposition: A | Discharge status: Home / Self Care | Payer: 59 | Source: Home / Self Care | Attending: Family Medicine | Admitting: Family Medicine

## 2011-12-08 DIAGNOSIS — M79609 Pain in unspecified limb: Secondary | ICD-10-CM

## 2011-12-08 DIAGNOSIS — M79672 Pain in left foot: Secondary | ICD-10-CM

## 2011-12-08 DIAGNOSIS — J309 Allergic rhinitis, unspecified: Secondary | ICD-10-CM

## 2011-12-08 MED ORDER — DICLOFENAC POTASSIUM 50 MG PO TABS: 50.0000 mg | ORAL_TABLET | Freq: Three times a day (TID) | ORAL | Status: DC

## 2011-12-08 MED ORDER — CETIRIZINE HCL 10 MG PO TABS: 10.0000 mg | ORAL_TABLET | Freq: Every day | ORAL | Status: DC

## 2011-12-08 MED ORDER — FLUTICASONE PROPIONATE 50 MCG/ACT NA SUSP: 2.0000 | Freq: Every day | NASAL | Status: DC

## 2011-12-08 NOTE — ED Provider Notes (Signed)
History     CSN: [id number]  Arrival date & time 12/08/11  1008   First MD Initiated Contact with Patient 12/08/11 1013      Chief Complaint  Patient presents with  . Foot Pain    (Consider location/radiation/quality/duration/timing/severity/associated sxs/prior treatment) HPI Patient with gradual worsening of lateral foot pain. Yesterday and today it has been more intense. She feels like she cannot bear weight on it. She denies any injury of the area. She has been exercising more and the treadmill and wearing rocking bottom shoes. She walks a lot at work. Last night she tried Aleve and an Ace wrap but this morning she does not feel any better. Patient has a history of prediabetes.  Patient also notes several weeks of itchy eyes, runny nose. She denies fevers, cough. She denies shortness of breath, chest pain. She's been using nasal saline, afrin, Benadryl. She states that she often has trouble with seasonal allergies. She is not on a daily medicine for this and has never tried one. Past Medical History  Diagnosis Date  . Anemia   . Allergic rhinitis   . Depressive disorder   . Insomnia   . Osteoarthritis   . GERD (gastroesophageal reflux disease)   . OSA (obstructive sleep apnea)   . 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 Cholecystectomy 1987  . Knee surgery 2004    left knee- Dr. Eulah Pont    Family History  Problem Relation Age of Onset  . Osteoarthritis Mother   . Hypertension Mother   . Diabetes Father   . Hypertension Sister   . Heart attack Brother   . Heart attack Sister   . Allergies Mother   . Allergies Son   . Allergies Sister   . Asthma Son   . Clotting disorder Sister   . Rheum arthritis Mother     History  Substance Use Topics  . Smoking status: Never Smoker   . Smokeless tobacco: Never Used  . Alcohol Use: Yes     2 glasses per weekend    OB History    Grav Para Term Preterm Abortions TAB SAB Ect  Mult Living                  Review of Systems See above. Otherwise negative. Allergies  Review of patient's allergies indicates no known allergies.  Home Medications   Current Outpatient Rx  Name Route Sig Dispense Refill  . ALEVE PO Oral Take by mouth.    . BETAMETHASONE DIPROPIONATE 0.05 % EX CREA Topical Apply topically 2 (two) times daily.      . BUPROPION HCL ER (XL) 150 MG PO TB24 Oral Take 1 tablet (150 mg total) by mouth daily. 60 tablet 6  . CETIRIZINE HCL 10 MG PO TABS Oral Take 1 tablet (10 mg total) by mouth daily. 30 tablet 0  . CLOBETASOL PROPIONATE 0.05 % EX OINT Topical Apply topically 2 (two) times daily.      Marland Kitchen DICLOFENAC POTASSIUM 50 MG PO TABS Oral Take 1 tablet (50 mg total) by mouth 3 (three) times daily. 30 tablet 0  . DOCUSATE SODIUM 100 MG PO CAPS Oral Take 100 mg by mouth 2 (two) times daily.      Marland Kitchen FERROUS SULFATE 325 (65 FE) MG PO TABS Oral Take 325 mg by mouth 2 (two) times daily.      Marland Kitchen FLUTICASONE PROPIONATE 50 MCG/ACT NA SUSP Nasal Place 2 sprays into  the nose daily. 16 g 0  . IPRATROPIUM BROMIDE 0.06 % NA SOLN Nasal Place 2 sprays into the nose 4 (four) times daily. 15 mL 12  . NEXIUM 40 MG PO CPDR  TAKE 1 CAPSULE BY MOUTH ONCE DAILY 90 capsule 3    BP 122/83  Pulse 72  Temp(Src) 98.4 F (36.9 C) (Oral)  Resp 14  SpO2 100%  LMP 11/27/2011  Physical Exam Vital signs reviewed General appearance - alert, well appearing, and in no distress MSK-patient is tender to palpation below the lateral malleolus and over the proximal fifth metatarsal. She has full range of motion. She has full strength. She is neurovascularly intact. She is without swelling or bruising. HEENT-some clear drainage present in the nose. Some tearing of the eyes is clear. No injection of the conjunctiva noted. Lungs- CTAB ED Course  Procedures (including critical care time)  Labs Reviewed - No data to display Dg Foot Complete Left  12/08/2011  *RADIOLOGY REPORT*  Clinical  Data: Left foot pain.  LEFT FOOT - COMPLETE 3+ VIEW  Comparison: None.  Findings: Plantar calcaneal spur.  Mild degenerative changes in the midfoot. No acute bony abnormality.  Specifically, no fracture, subluxation, or dislocation.  Soft tissues are intact.  IMPRESSION: No acute bony abnormality.  Original Report Authenticated By: Cyndie Chime, M.D.     1. Allergic rhinitis   2. Foot pain, left       MDM  1. Seasonal allergies-will prescribe Zyrtec and Flonase today. To followup with PCP. 2. Foot pain-x-ray of the foot. Considered stress fracture of the fifth metatarsal due to unstable footwear and increased activity. X-ray was normal. Gave postop shoe, instructions for NSAID and ice, note for out of work x2 days, return to PCP for recheck.        Reginold Agent, MD 12/08/11 [phone]

## 2011-12-08 NOTE — Discharge Instructions (Signed)
Allergic Rhinitis  Allergic rhinitis is when the mucous membranes in the nose respond to allergens. Allergens are particles in the air that cause your body to have an allergic reaction. This causes you to release allergic antibodies. Through a chain of events, these eventually cause you to release histamine into the blood stream (hence the use of antihistamines). Although meant to be protective to the body, it is this release that causes your discomfort, such as frequent sneezing, congestion and an itchy runny nose.    CAUSES    The pollen allergens may come from grasses, trees, and weeds. This is seasonal allergic rhinitis, or "hay fever." Other allergens cause year-round allergic rhinitis (perennial allergic rhinitis) such as house dust mite allergen, pet dander and mold spores.    SYMPTOMS     Nasal stuffiness (congestion).   Runny, itchy nose with sneezing and tearing of the eyes.   There is often an itching of the mouth, eyes and ears.  It cannot be cured, but it can be controlled with medications.  DIAGNOSIS    If you are unable to determine the offending allergen, skin or blood testing may find it.  TREATMENT     Avoid the allergen.   Medications and allergy shots (immunotherapy) can help.   Hay fever may often be treated with antihistamines in pill or nasal spray forms. Antihistamines block the effects of histamine. There are over-the-counter medicines that may help with nasal congestion and swelling around the eyes. Check with your caregiver before taking or giving this medicine.  If the treatment above does not work, there are many new medications your caregiver can prescribe. Stronger medications may be used if initial measures are ineffective. Desensitizing injections can be used if medications and avoidance fails. Desensitization is when a patient is given ongoing shots until the body becomes less sensitive to the allergen. Make sure you follow up with your caregiver if problems continue.  SEEK  MEDICAL CARE IF:     You develop fever (more than 100.5 F (38.1 C).   You develop a cough that does not stop easily (persistent).   You have shortness of breath.   You start wheezing.   Symptoms interfere with normal daily activities.  Document Released: 05/05/2001 Document Revised: 07/30/2011 Document Reviewed: 11/14/2008  ExitCare Patient Information 2012 [employer].

## 2011-12-08 NOTE — ED Notes (Signed)
Patient seemed uncomfortable, complaining of pain, limping with attempts to walk, grimacing.  Patient is alone and driving.  Reported complaints to dr Artis Flock.  E-prescribed pain medicine to patient's pharmacy.  Reprinted avs for patient.  Josh, registration personal assisted patient to car. Patient in wheelchair.

## 2011-12-08 NOTE — ED Notes (Signed)
Left foot pain, throbbing today.  No known injury.  Reports noticing foot pain last night.  Patient unable to put weight on foot.  Also c/o sinus congestion.

## 2011-12-09 ENCOUNTER — Ambulatory Visit: Payer: 59 | Admitting: Family Medicine

## 2011-12-12 NOTE — ED Provider Notes (Signed)
Medical screening examination/treatment/procedure(s) were performed by resident physician or non-physician practitioner and as supervising physician I was immediately available for consultation/collaboration.   KINDL,JAMES DOUGLAS MD.    James D Kindl, MD 12/12/11 1141 

## 2011-12-16 ENCOUNTER — Ambulatory Visit (INDEPENDENT_AMBULATORY_CARE_PROVIDER_SITE_OTHER): Payer: 59 | Admitting: Family Medicine

## 2011-12-16 ENCOUNTER — Encounter: Payer: Self-pay | Admitting: Family Medicine

## 2011-12-16 VITALS — BP 129/85 | HR 76 | Ht 66.0 in | Wt 253.0 lb

## 2011-12-16 DIAGNOSIS — F32A Depression, unspecified: Secondary | ICD-10-CM

## 2011-12-16 DIAGNOSIS — R7309 Other abnormal glucose: Secondary | ICD-10-CM

## 2011-12-16 DIAGNOSIS — E669 Obesity, unspecified: Secondary | ICD-10-CM

## 2011-12-16 DIAGNOSIS — E7800 Pure hypercholesterolemia, unspecified: Secondary | ICD-10-CM | POA: Insufficient documentation

## 2011-12-16 DIAGNOSIS — F329 Major depressive disorder, single episode, unspecified: Secondary | ICD-10-CM

## 2011-12-16 DIAGNOSIS — F3289 Other specified depressive episodes: Secondary | ICD-10-CM

## 2011-12-16 DIAGNOSIS — R7303 Prediabetes: Secondary | ICD-10-CM | POA: Insufficient documentation

## 2011-12-16 MED ORDER — BUPROPION HCL ER (XL) 300 MG PO TB24: 300.0000 mg | ORAL_TABLET | Freq: Every day | ORAL | Status: DC

## 2011-12-16 NOTE — Patient Instructions (Addendum)
Weight management: Why?  Because of elevated LDL, because of prediabetes, and for a healthy weight Exercise: Bike- at gym-45 min- 3 x week. Walking- 1 hour- 1 x per week Diet: See plate handout method  Depression: Increase wellbutrin XL to 300mg  daily.   Return in 4 weeks for recheck

## 2011-12-16 NOTE — Assessment & Plan Note (Signed)
LDL 123-  Pt aware of level- discussed today.  Encouraged weight loss, diet, exercise.

## 2011-12-16 NOTE — Assessment & Plan Note (Signed)
Set exercise goals with patient today.  Began to discuss diet plan.  Failed to give patient plate method handout before leaving today- need to give to patient at next appointment and discuss nutrition in more detail.

## 2011-12-16 NOTE — Assessment & Plan Note (Signed)
A1C of 6.1 at last appointment.  Pt aware of level- discussed today.  Encouraged weight loss, diet, exercise.

## 2011-12-16 NOTE — Progress Notes (Signed)
  Subjective:    Patient ID: Barbara Reese, female    DOB: [birth year], 46 y.o.   MRN: [id number]  HPI   Depression:  Patient continues to endorse little pleasure or interest in doing things, feels down or hopeless, trouble falling or staying asleep, problems with appetite, feeling bad about herself, and having trouble with concentration.  No suicide or homicidal ideation. Patient states that she feels she is not herself. Improved fatigue compared to last visit. But Still feels "blah".  Would like to have improved energy and overall decrease in depressed feelings. Started exercising 2 weeks ago.  Has been taking wellbutrin XL 150mg  daily since last appointment.    Labwork review: LDL: 123-discussed with patient that the LDL is slightly elevated A1C: 6.1-discussed with patient that this puts her in prediabetes range  Obesity: Pt started biking and walking 3-4 x per week for 45 min at a time 2 weeks ago Has lost 4 lbs since last visit Still has problems with diet.- eats 1-2 fruits and vegtables per day. Does eat 3 meals per day  Smoking status reviewed.    Review of Systems As per above    Objective:   Physical Exam  Constitutional: She appears well-developed and well-nourished.  HENT:  Head: Normocephalic and atraumatic.  Eyes: Pupils are equal, round, and reactive to light.  Cardiovascular: Normal rate, regular rhythm and normal heart sounds.   No murmur heard. Pulmonary/Chest: Effort normal. No respiratory distress.  Abdominal: Soft.  Musculoskeletal: She exhibits no edema.  Neurological: She is alert.  Skin: No rash noted.  Psychiatric: She has a normal mood and affect. Her behavior is normal.       No SI or HI.          Assessment & Plan:

## 2011-12-16 NOTE — Assessment & Plan Note (Signed)
Increased wellbutrin to 300mg  daily.  Pt to return in 4 weeks for recheck.  Pt aware of medication side effects.  In 4 weeks will have pt complete phq-9 to compare score to initial score.  If no improvement will consider changing to alternate antidepressant.

## 2012-02-16 ENCOUNTER — Other Ambulatory Visit: Payer: Self-pay | Admitting: *Deleted

## 2012-02-17 MED ORDER — ESOMEPRAZOLE MAGNESIUM 40 MG PO CPDR: 40.0000 mg | DELAYED_RELEASE_CAPSULE | Freq: Every day | ORAL | Status: DC

## 2012-03-11 ENCOUNTER — Ambulatory Visit (INDEPENDENT_AMBULATORY_CARE_PROVIDER_SITE_OTHER): Payer: 59 | Admitting: Family Medicine

## 2012-03-11 ENCOUNTER — Encounter: Payer: Self-pay | Admitting: Family Medicine

## 2012-03-11 VITALS — BP 116/82 | HR 87 | Ht 66.0 in | Wt 253.0 lb

## 2012-03-11 DIAGNOSIS — F411 Generalized anxiety disorder: Secondary | ICD-10-CM

## 2012-03-11 DIAGNOSIS — F3289 Other specified depressive episodes: Secondary | ICD-10-CM

## 2012-03-11 MED ORDER — AMITRIPTYLINE HCL 25 MG PO TABS: 25.0000 mg | ORAL_TABLET | Freq: Every day | ORAL | Status: DC

## 2012-03-11 MED ORDER — LORAZEPAM 1 MG PO TABS: 0.5000 mg | ORAL_TABLET | ORAL | Status: DC

## 2012-03-11 NOTE — Patient Instructions (Addendum)
For sleep: Take amitriptyline to 1 hour before bed.  Look at sleep hygiene sheet.   For anxiety: I think that this will get better if we can get you to rest.  If doesn't improve over the next 2-3 week return and we may need to adjust your medication.   Plane ride: Take Ativan 30 min before plane ride.  Remember the side effects we discussed.   Return in 2-3 weeks for recheck.

## 2012-03-14 NOTE — Assessment & Plan Note (Addendum)
Anxiety appears to be situational- per pt.  I encouraged pt to seek out therapy to help her through this hard time.  Pt resistent to adding SSRI at this time.  I feel it is reasonable to wait on adding additional agent.  I do not want to get in the habit of prescribing benzos for this pt- I do not think that this would be in her best interest- nor would it treat the root problem.  Encouraged pt to continue to work on job [employer].  I do feel that if pt could sleep better at night this may help decrease her anxiety- started pt on low dose amitriptyline at bedtime. Pt states understanding.  Pt to return in 1 month for fup.   Will prescribe small amount (5 tablets) of ativan to help with pt airplane anxiety.  Discussed proper use with patient.

## 2012-03-14 NOTE — Progress Notes (Signed)
  Subjective:    Patient ID: Barbara Reese, female    DOB: Feb 17, 1966, 46 y.o.   MRN: [id number]  HPI 1) depression followup: Pt states that she is feeling much better as far as depression is concerned. No longer has feelings of depression. No longer feeling "Blah".   No Anhedonia.  Has interest in activities, no guilt feelings,  Good energy, no SI or HI.  But lost job 2 weeks ago and now is having symptoms of anxiety.  Having racing thoughts.  Feeling very anxious and nervous.  Tries to pray and focus self but still having problems with anxiety and wanted to come discuss to see if anything she can take to help.  Has had chronic problems with sleep and this has only worsened.  Some problems with concentration.  decreased appetite.  No fever.  No cold symptoms.  No chills. No body aches.  No sob. No panic attacks.   2) plane flight: Going on vacation and flying.   Via arplane to destination.  Pt has fear of flying would like medication to help her be able to get on plane. Has symptoms of panic attack in the past when trying to fly un medicated.  Pt very anxious talking about the plane ride in exam room.   Smoking status reviewed.    Review of Systems As per above    Objective:   Physical Exam  Constitutional: She appears well-developed and well-nourished.  HENT:  Head: Normocephalic and atraumatic.  Eyes: EOM are normal. Right eye exhibits no discharge. Left eye exhibits no discharge.  Neck: Normal range of motion. Neck supple.  Cardiovascular: Normal rate, regular rhythm and normal heart sounds.   No murmur heard. Pulmonary/Chest: Effort normal. No respiratory distress.  Musculoskeletal: She exhibits no edema.  Skin: No rash noted.  Psychiatric: She has a normal mood and affect. Her behavior is normal.       See depression screen questions in [facility].  No Si or HI.           Assessment & Plan:

## 2012-03-14 NOTE — Assessment & Plan Note (Signed)
Continue wellbutrin at current dosage since pt feels that this has helped.  Could consider addition of SSRI at future visit if symptoms do not continue to improve.  Pt to return in 1-2 months for recheck.

## 2012-08-05 ENCOUNTER — Encounter (HOSPITAL_COMMUNITY): Payer: Self-pay | Admitting: Emergency Medicine

## 2012-08-05 ENCOUNTER — Emergency Department (INDEPENDENT_AMBULATORY_CARE_PROVIDER_SITE_OTHER)
Admission: EM | Admit: 2012-08-05 | Discharge: 2012-08-05 | Disposition: A | Discharge status: Home / Self Care | Payer: Self-pay | Source: Home / Self Care | Attending: Family Medicine | Admitting: Family Medicine

## 2012-08-05 DIAGNOSIS — J069 Acute upper respiratory infection, unspecified: Secondary | ICD-10-CM

## 2012-08-05 MED ORDER — IPRATROPIUM BROMIDE 0.06 % NA SOLN: 2.0000 | Freq: Four times a day (QID) | NASAL | Status: DC

## 2012-08-05 NOTE — ED Provider Notes (Signed)
History     CSN: [id number]  Arrival date & time 08/05/12  1417   First MD Initiated Contact with Patient 08/05/12 1421      Chief Complaint  Patient presents with  . URI    (Consider location/radiation/quality/duration/timing/severity/associated sxs/prior treatment) Patient is a 46 y.o. female presenting with URI. The history is provided by the patient.  URI The primary symptoms include ear pain, sore throat and cough. Primary symptoms do not include fever, swollen glands, wheezing, abdominal pain, nausea, vomiting or myalgias. The current episode started 3 to 5 days ago. This is a new problem. The problem has not changed since onset. Symptoms associated with the illness include congestion and rhinorrhea.    Past Medical History  Diagnosis Date  . Anemia   . Allergic rhinitis   . Depressive disorder   . Insomnia   . Osteoarthritis   . GERD (gastroesophageal reflux disease)   . OSA (obstructive sleep apnea)   . 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 Cholecystectomy 1987  . Knee surgery 2004    left knee- Dr. Eulah Pont    Family History  Problem Relation Age of Onset  . Osteoarthritis Mother   . Hypertension Mother   . Diabetes Father   . Hypertension Sister   . Heart attack Brother   . Heart attack Sister   . Allergies Mother   . Allergies Son   . Allergies Sister   . Asthma Son   . Clotting disorder Sister   . Rheum arthritis Mother     History  Substance Use Topics  . Smoking status: Never Smoker   . Smokeless tobacco: Never Used  . Alcohol Use: Yes     Comment: 2 glasses per weekend    OB History    Grav Para Term Preterm Abortions TAB SAB Ect Mult Living                  Review of Systems  Constitutional: Negative.  Negative for fever.  HENT: Positive for ear pain, congestion, sore throat and rhinorrhea.   Respiratory: Positive for cough. Negative for wheezing.   Gastrointestinal: Negative for  nausea, vomiting and abdominal pain.  Musculoskeletal: Negative for myalgias.    Allergies  Review of patient's allergies indicates no known allergies.  Home Medications   Current Outpatient Rx  Name  Route  Sig  Dispense  Refill  . AMITRIPTYLINE HCL 25 MG PO TABS   Oral   Take 1 tablet (25 mg total) by mouth at bedtime.   30 tablet   0   . BUPROPION HCL ER (XL) 300 MG PO TB24   Oral   Take 1 tablet (300 mg total) by mouth daily.   30 tablet   1   . CETIRIZINE HCL 10 MG PO TABS   Oral   Take 1 tablet (10 mg total) by mouth daily.   30 tablet   0   . CLOBETASOL PROPIONATE 0.05 % EX OINT   Topical   Apply topically 2 (two) times daily.           Marland Kitchen DOCUSATE SODIUM 100 MG PO CAPS   Oral   Take 100 mg by mouth 2 (two) times daily.           Marland Kitchen ESOMEPRAZOLE MAGNESIUM 40 MG PO CPDR   Oral   Take 1 capsule (40 mg total) by mouth daily before breakfast.   90 capsule   1   .  FERROUS SULFATE 325 (65 FE) MG PO TABS   Oral   Take 325 mg by mouth 2 (two) times daily.           Marland Kitchen FLUTICASONE PROPIONATE 50 MCG/ACT NA SUSP   Nasal   Place 2 sprays into the nose daily.   16 g   0   . IPRATROPIUM BROMIDE 0.06 % NA SOLN   Nasal   Place 2 sprays into the nose 4 (four) times daily.   15 mL   1     BP 147/85  Pulse 77  Temp 97.3 F (36.3 C) (Oral)  Resp 20  SpO2 99%  LMP 07/28/2012  Physical Exam  Nursing note and vitals reviewed. Constitutional: She is oriented to person, place, and time. She appears well-developed and well-nourished.  HENT:  Head: Normocephalic.  Right Ear: External ear normal.  Left Ear: External ear normal.  Nose: Nose normal.  Mouth/Throat: Oropharynx is clear and moist.  Eyes: Pupils are equal, round, and reactive to light.  Neck: Normal range of motion. Neck supple.  Cardiovascular: Normal rate, normal heart sounds and intact distal pulses.   Pulmonary/Chest: Effort normal and breath sounds normal.  Neurological: She is alert and  oriented to person, place, and time.  Skin: Skin is warm and dry.    ED Course  Procedures (including critical care time)  Labs Reviewed - No data to display No results found.   1. URI (upper respiratory infection)       MDM          Linna Hoff, MD 08/05/12 1534

## 2012-08-05 NOTE — ED Notes (Signed)
Pt c/o cold sx x3 days... Sx include: right ear pain, sore throat, dry cough... Denies: fevers, vomiting, nauseas, diarrhea, congestion... She is alert w/no signs of acute distress.

## 2012-08-15 ENCOUNTER — Ambulatory Visit (INDEPENDENT_AMBULATORY_CARE_PROVIDER_SITE_OTHER): Payer: Self-pay | Admitting: Family Medicine

## 2012-08-15 ENCOUNTER — Encounter: Payer: Self-pay | Admitting: Family Medicine

## 2012-08-15 VITALS — BP 139/93 | HR 87 | Temp 98.2°F | Wt 264.0 lb

## 2012-08-15 DIAGNOSIS — K219 Gastro-esophageal reflux disease without esophagitis: Secondary | ICD-10-CM

## 2012-08-15 DIAGNOSIS — L439 Lichen planus, unspecified: Secondary | ICD-10-CM

## 2012-08-15 DIAGNOSIS — F3289 Other specified depressive episodes: Secondary | ICD-10-CM

## 2012-08-15 DIAGNOSIS — L430 Hypertrophic lichen planus: Secondary | ICD-10-CM

## 2012-08-15 MED ORDER — AMITRIPTYLINE HCL 25 MG PO TABS: 25.0000 mg | ORAL_TABLET | Freq: Every day | ORAL | Status: DC

## 2012-08-15 MED ORDER — OMEPRAZOLE 40 MG PO CPDR: 40.0000 mg | DELAYED_RELEASE_CAPSULE | Freq: Every day | ORAL | Status: DC

## 2012-08-15 MED ORDER — BUPROPION HCL ER (SR) 150 MG PO TB12: ORAL_TABLET | ORAL | Status: DC

## 2012-08-15 MED ORDER — TRIAMCINOLONE ACETONIDE 0.5 % EX OINT: TOPICAL_OINTMENT | Freq: Two times a day (BID) | CUTANEOUS | Status: DC

## 2012-08-15 NOTE — Assessment & Plan Note (Signed)
Omeprazole 40 mg once daily. We can try going up to twice daily. This should match the efficacy of Nexium. Prescription provided although I explained to the patient is available over-the-counter so she may have to find somewhere to purchase it in bulk as it has been expensive drug stores.

## 2012-08-15 NOTE — Assessment & Plan Note (Signed)
We will try triamcinolone. I am not certain about the cost but believed to be less expensive.

## 2012-08-15 NOTE — Patient Instructions (Signed)
It was good to see you today! I am giving you printed prescriptions for 4 medications.  Inquire at [facility], [facility], [facility], [facility], [facility], [facility], and [facility] to find out if they are on discount lists.  I have given you 3 month supplies on the pills as this is typically less expensive in the long run.  You can generally get the pharmacist to do just a one month supply if needed. I would recommend that you come back in about a month to see how the omeprazole is working and to see how your mood is doing.

## 2012-08-15 NOTE — Progress Notes (Signed)
Patient ID: Barbara Reese, female   DOB: 15-Jan-1966, 46 y.o.   MRN: [id number] Subjective: The patient is a 46 y.o. year old female who presents today for reflux and med refills.  1. Reflux: Patient has a long history of reflux. She's tried multiple over-the-counter medications and was eventually started on Nexium. Since being a laid off of her job in July of this year she's been unable to afford this medication. She is wanting to know if there are any less expensive alternatives. She is having problems with reflux, heartburn, and burping.  2. Depression: Patient is a little of her Wellbutrin since July of this year. She reports that she does have problems with sleeping and depressed mood along with anxiety. She is not sure if these are specifically related to her depressive disorder or if they are more situational. She is any suicidal or homicidal thoughts.  3. Lichen planus: Patient, unable to afford clobetasol and is wondering if there are any less expensive options.  Patient's past medical, social, and family history were reviewed and updated as appropriate. History  Substance Use Topics  . Smoking status: Never Smoker   . Smokeless tobacco: Never Used  . Alcohol Use: Yes     Comment: 2 glasses per weekend   Objective:  Filed Vitals:   08/15/12 0909  BP: 139/93  Pulse: 87  Temp: 98.2 F (36.8 C)   Gen: No acute distress, obese CV: Regular rate and rhythm, no murmurs appreciated Resp: Clear to auscultation bilaterally  Assessment/Plan:  Please also see individual problems in problem list for problem-specific plans.

## 2012-08-15 NOTE — Assessment & Plan Note (Signed)
Restart generic Wellbutrin and amitriptyline at night for sleep. Patient followup in 4 weeks. Patient advised of possible side effects.

## 2012-10-11 ENCOUNTER — Emergency Department (INDEPENDENT_AMBULATORY_CARE_PROVIDER_SITE_OTHER)
Admission: EM | Admit: 2012-10-11 | Discharge: 2012-10-11 | Disposition: A | Discharge status: Home / Self Care | Payer: Self-pay | Source: Home / Self Care

## 2012-10-11 ENCOUNTER — Encounter (HOSPITAL_COMMUNITY): Payer: Self-pay | Admitting: *Deleted

## 2012-10-11 DIAGNOSIS — L089 Local infection of the skin and subcutaneous tissue, unspecified: Secondary | ICD-10-CM

## 2012-10-11 DIAGNOSIS — B89 Unspecified parasitic disease: Secondary | ICD-10-CM

## 2012-10-11 DIAGNOSIS — K0262 Dental caries on smooth surface penetrating into dentin: Secondary | ICD-10-CM

## 2012-10-11 DIAGNOSIS — K089 Disorder of teeth and supporting structures, unspecified: Secondary | ICD-10-CM

## 2012-10-11 DIAGNOSIS — R6884 Jaw pain: Secondary | ICD-10-CM

## 2012-10-11 DIAGNOSIS — K0889 Other specified disorders of teeth and supporting structures: Secondary | ICD-10-CM

## 2012-10-11 MED ORDER — AMOXICILLIN-POT CLAVULANATE 875-125 MG PO TABS: 1.0000 | ORAL_TABLET | Freq: Two times a day (BID) | ORAL | Status: DC

## 2012-10-11 MED ORDER — ACETAMINOPHEN-CODEINE #3 300-30 MG PO TABS: 1.0000 | ORAL_TABLET | Freq: Four times a day (QID) | ORAL | Status: DC | PRN

## 2012-10-11 NOTE — ED Provider Notes (Signed)
History     CSN: [id number]  Arrival date & time 10/11/12  1058   First MD Initiated Contact with Patient 10/11/12 1117      Chief Complaint  Patient presents with  . Otalgia  . Dental Pain    (Consider location/radiation/quality/duration/timing/severity/associated sxs/prior treatment) HPI Comments: 2 days ago this 47 year old female developed pain in the right lower jaw all that radiates to the right year. She states that she has a toothache in her lower right jaw. She denies fever, chills, problems with opening her jaw, sore throat or cough. She states the area surrounding the angle of the jaw is tender with overlying swelling extending into the right face.   Past Medical History  Diagnosis Date  . Anemia   . Allergic rhinitis   . Depressive disorder   . Insomnia   . Osteoarthritis   . GERD (gastroesophageal reflux disease)   . OSA (obstructive sleep apnea)   . 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 Cholecystectomy  1987  . Knee surgery  2004    left knee- Dr. Eulah Pont    Family History  Problem Relation Age of Onset  . Osteoarthritis Mother   . Hypertension Mother   . Diabetes Father   . Hypertension Sister   . Heart attack Brother   . Heart attack Sister   . Allergies Mother   . Allergies Son   . Allergies Sister   . Asthma Son   . Clotting disorder Sister   . Rheum arthritis Mother     History  Substance Use Topics  . Smoking status: Never Smoker   . Smokeless tobacco: Never Used  . Alcohol Use: Yes     Comment: 2 glasses per weekend    OB History   Grav Para Term Preterm Abortions TAB SAB Ect Mult Living                  Review of Systems  Constitutional: Negative.   HENT: Positive for ear pain, facial swelling and dental problem. Negative for hearing loss, congestion, sore throat, rhinorrhea, mouth sores, trouble swallowing, neck pain, neck stiffness, postnasal drip and ear discharge.    Cardiovascular: Negative.   Gastrointestinal: Negative.   Musculoskeletal: Negative.   Skin: Negative.   Neurological: Negative.   Psychiatric/Behavioral: Negative.     Allergies  Review of patient's allergies indicates no known allergies.  Home Medications   Current Outpatient Rx  Name  Route  Sig  Dispense  Refill  . acetaminophen-codeine (TYLENOL #3) 300-30 MG per tablet   Oral   Take 1-2 tablets by mouth every 6 (six) hours as needed for pain.   30 tablet   0   . amitriptyline (ELAVIL) 25 MG tablet   Oral   Take 1 tablet (25 mg total) by mouth at bedtime.   90 tablet   1   . amoxicillin-clavulanate (AUGMENTIN) 875-125 MG per tablet   Oral   Take 1 tablet by mouth every 12 (twelve) hours.   14 tablet   0   . buPROPion (WELLBUTRIN SR) 150 MG 12 hr tablet      Take one pill by mouth once daily for 2 weeks then 2 times daily after that   180 tablet   1   . cetirizine (ZYRTEC) 10 MG tablet   Oral   Take 1 tablet (10 mg total) by mouth daily.   30 tablet   0   .  docusate sodium (COLACE) 100 MG capsule   Oral   Take 100 mg by mouth 2 (two) times daily.           . ferrous sulfate (MEIJER FERROUS SULFATE) 325 (65 FE) MG tablet   Oral   Take 325 mg by mouth 2 (two) times daily.           . fluticasone (FLONASE) 50 MCG/ACT nasal spray   Nasal   Place 2 sprays into the nose daily.   16 g   0   . ipratropium (ATROVENT) 0.06 % nasal spray   Nasal   Place 2 sprays into the nose 4 (four) times daily.   15 mL   1   . omeprazole (PRILOSEC) 40 MG capsule   Oral   Take 1 capsule (40 mg total) by mouth daily.   90 capsule   1   . triamcinolone ointment (KENALOG) 0.5 %   Topical   Apply topically 2 (two) times daily.   30 g   3     BP 145/90  Pulse 99  Temp(Src) 98.4 F (36.9 C)  Resp 20  SpO2 99%  LMP 09/20/2012  Physical Exam  Nursing note and vitals reviewed. Constitutional: She is oriented to person, place, and time. She appears  well-developed and well-nourished. No distress.  HENT:  Nose: Nose normal.  Mouth/Throat: Oropharynx is clear and moist. No oropharyngeal exudate.  Bilateral TMs are pearly gray, transparent, normal light reflex. No erythema, effusion or retraction. Oropharynx normal. Dental exam reveals a crater-type cavity of the joint surface of the lower third molar. There is no tenderness to the teeth with tapping. There is swelling surrounding the involved tooth extending from the soft tissues of the right cheek mucosa. Tenderness R face, angle of the jaw. No mastoid tenderness. No erythema.   Eyes: Conjunctivae and EOM are normal.  Neck: Normal range of motion. Neck supple.  Cardiovascular: Normal rate.   Pulmonary/Chest: Effort normal.  Neurological: She is alert and oriented to person, place, and time. She exhibits normal muscle tone.  Skin: Skin is warm and dry.  Psychiatric: She has a normal mood and affect.    ED Course  Procedures (including critical care time)  Labs Reviewed - No data to display No results found.   1. Pain in lower jaw   2. Odontalgia   3. Facial infection       MDM  Diagnosis with the highest probability is that of an infection and pain originating from the right lower third molar with the large cavity. Less likely sialadenitis. Treatment with Augmentin 875 twice a day for 7 days Tylenol No. 3 one to 2 tabs Q4 to 6 hours when necessary pain.Marland Kitchen. (unable to tolerate oxycodone and hydrocodone) May continue Aleve at home for inflammation. Take his medications with food. For any new symptoms problems or worsening such as fever, increased swelling, inability to open jaw, increased pain or other problems or need to seek medical attention promptly. He may followup with your primary care doctor or go to emergency department. Nail need to make an appointment with your dentist as soon as possible.         Hayden Rasmussen, NP 10/11/12 1345

## 2012-10-11 NOTE — ED Provider Notes (Signed)
Medical screening examination/treatment/procedure(s) were performed by resident physician or non-physician practitioner and as supervising physician I was immediately available for consultation/collaboration.   KINDL,JAMES DOUGLAS MD.   James D Kindl, MD 10/11/12 1512 

## 2012-10-11 NOTE — ED Notes (Signed)
Pt reports mouth pain on the right side that radiates into ear with no known cause - no relief from otc meds or old Tramadol prescrip.

## 2012-11-23 ENCOUNTER — Other Ambulatory Visit: Payer: Self-pay | Admitting: *Deleted

## 2012-11-23 ENCOUNTER — Telehealth: Payer: Self-pay | Admitting: Family Medicine

## 2012-11-23 MED ORDER — BUPROPION HCL ER (XL) 300 MG PO TB24: 300.0000 mg | ORAL_TABLET | Freq: Every day | ORAL | Status: DC

## 2012-11-23 NOTE — Telephone Encounter (Signed)
To [facility] red team - please call pt and let them know I have refilled one month's worth of their wellbutrin but they need to schedule an appointment to follow up on their depression (were supposed to come back in January). Thanks!

## 2012-11-23 NOTE — Telephone Encounter (Signed)
Left message to return call. Please read message to patient from Dr McIntyre.Barbara Reese  

## 2012-11-24 NOTE — Telephone Encounter (Signed)
APPT 12/23/12

## 2012-11-30 ENCOUNTER — Telehealth: Payer: Self-pay | Admitting: Family Medicine

## 2012-11-30 ENCOUNTER — Other Ambulatory Visit: Payer: Self-pay | Admitting: Family Medicine

## 2012-11-30 MED ORDER — PANTOPRAZOLE SODIUM 40 MG PO TBEC: 40.0000 mg | DELAYED_RELEASE_TABLET | Freq: Every day | ORAL | Status: DC

## 2012-11-30 NOTE — Telephone Encounter (Signed)
Patients supervisor faxed over a form that needs to be completed and I gave it to [place].  Now the patient is calling asking if I received it and I let her know that I gave it to [facility] and that it will be completed and faxed back.  She would like someone to call her at [phone].

## 2012-11-30 NOTE — Telephone Encounter (Signed)
Patient has just started working for the [employer] but her last Physical was 11/13/11 and her supervisor wants her to get in asap for a physical.  Her PCP doesn't have an opening until 5/2, which she is currently scheduled for a Physical/med refill.  The question is, can she come in sooner for her physical with a different doctor for her work and come and see her PCP for her med refills on 5/2.  She said that if the call back comes in before 2:00, call her at work, but if after 2:00, call her on her cell #.

## 2012-12-01 ENCOUNTER — Encounter: Payer: Self-pay | Admitting: Family Medicine

## 2012-12-01 ENCOUNTER — Ambulatory Visit (INDEPENDENT_AMBULATORY_CARE_PROVIDER_SITE_OTHER): Payer: Self-pay | Admitting: Family Medicine

## 2012-12-01 VITALS — BP 138/85 | HR 105 | Temp 98.3°F

## 2012-12-01 DIAGNOSIS — M129 Arthropathy, unspecified: Secondary | ICD-10-CM

## 2012-12-01 DIAGNOSIS — Z0289 Encounter for other administrative examinations: Secondary | ICD-10-CM

## 2012-12-01 DIAGNOSIS — F329 Major depressive disorder, single episode, unspecified: Secondary | ICD-10-CM

## 2012-12-01 DIAGNOSIS — M1990 Unspecified osteoarthritis, unspecified site: Secondary | ICD-10-CM | POA: Insufficient documentation

## 2012-12-01 DIAGNOSIS — F3289 Other specified depressive episodes: Secondary | ICD-10-CM

## 2012-12-01 NOTE — Telephone Encounter (Signed)
Called patient at [facility] and informed to come today for PE at 9:30 am with Dr. Lum Babe.  Gaylene Brooks, RN

## 2012-12-01 NOTE — Assessment & Plan Note (Signed)
Work physical Normal physical exam,work form filled by me today to be faxed to her work.

## 2012-12-01 NOTE — Patient Instructions (Signed)

## 2012-12-01 NOTE — Progress Notes (Signed)
Subjective:     Patient ID: Barbara Reese, female   DOB: [birth year], 47 y.o.   MRN: [id number]  HPI Work physical:Here with work form to assess her physical and emotional status for work. Depression:She was just restarted on Wellbutrin,she has been doing well on med,she denies suicidal ideation or homicidal ideation,she does have sleep problem. Left knee arthritis:She currently does not have knee pain,she is well controlled on pain med.She had surgery in 2001.  Past Medical History  Diagnosis Date  . Anemia   . Allergic rhinitis   . Depressive disorder   . Insomnia   . Osteoarthritis   . GERD (gastroesophageal reflux disease)   . OSA (obstructive sleep apnea)   . 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   Cardiovascular: Negative.   Gastrointestinal: Negative.   Genitourinary: Negative.   Neurological: Negative.   Psychiatric/Behavioral: Positive for sleep disturbance. Negative for suicidal ideas, behavioral problems, self-injury and agitation. The patient is not nervous/anxious.   All other systems reviewed and are negative.   Filed Vitals:   12/01/12 1030  BP: 138/85  Pulse: 105  Temp: 98.3 F (36.8 C)       Objective:   Physical Exam  Nursing note and vitals reviewed. Constitutional: She is oriented to person, place, and time. She appears well-developed. No distress.  Cardiovascular: Normal rate, regular rhythm, normal heart sounds and intact distal pulses.   No murmur heard. Pulmonary/Chest: Effort normal and breath sounds normal. No respiratory distress. She has no wheezes.  Abdominal: Soft. Bowel sounds are normal. She exhibits no distension and no mass. There is no tenderness.  Musculoskeletal: Normal range of motion. She exhibits no edema.  Neurological: She is alert and oriented to person, place, and time.  Psychiatric: She has a normal mood and affect. Her behavior is normal. Judgment and thought content  normal.       Assessment:     Work physical Depression: Stable on med. Arthritis; Stable on med.     Plan:     1. Normal physical exam,work form filled by me today to be faxed to her work.  2. Continue Wellbutrin as instructed,return to PMD to discuss insomnia if no improvement.  3. Continue NSAID prn pain.

## 2012-12-01 NOTE — Assessment & Plan Note (Signed)
  Depression: Stable on med.  Continue Wellbutrin as instructed,return to PMD to discuss insomnia if no improvement.

## 2012-12-01 NOTE — Assessment & Plan Note (Signed)
  Arthritis; Stable on med.  Continue NSAID prn pain.

## 2012-12-16 ENCOUNTER — Other Ambulatory Visit (HOSPITAL_COMMUNITY): Payer: Self-pay | Admitting: Family Medicine

## 2012-12-23 ENCOUNTER — Encounter: Payer: Self-pay | Admitting: Family Medicine

## 2012-12-23 ENCOUNTER — Ambulatory Visit (INDEPENDENT_AMBULATORY_CARE_PROVIDER_SITE_OTHER): Payer: Commercial Managed Care - PPO | Admitting: Family Medicine

## 2012-12-23 VITALS — BP 144/94 | HR 80 | Ht 66.0 in | Wt 258.0 lb

## 2012-12-23 DIAGNOSIS — F329 Major depressive disorder, single episode, unspecified: Secondary | ICD-10-CM

## 2012-12-23 DIAGNOSIS — G4733 Obstructive sleep apnea (adult) (pediatric): Secondary | ICD-10-CM

## 2012-12-23 DIAGNOSIS — F32A Depression, unspecified: Secondary | ICD-10-CM

## 2012-12-23 DIAGNOSIS — E7800 Pure hypercholesterolemia, unspecified: Secondary | ICD-10-CM

## 2012-12-23 DIAGNOSIS — R7309 Other abnormal glucose: Secondary | ICD-10-CM

## 2012-12-23 DIAGNOSIS — R7303 Prediabetes: Secondary | ICD-10-CM

## 2012-12-23 DIAGNOSIS — R030 Elevated blood-pressure reading, without diagnosis of hypertension: Secondary | ICD-10-CM

## 2012-12-23 DIAGNOSIS — F3289 Other specified depressive episodes: Secondary | ICD-10-CM

## 2012-12-23 NOTE — Patient Instructions (Signed)
It was nice to meet you today!  Come back within the next 1-2 weeks to get labwork checked (in the morning before eating and drinking -water is okay), and your blood pressure checked. Then schedule an appointment to see me one month from now. You can make these appointments on the way out today.  For your blood pressure, watch how much salt you are eating. This can be hidden in foods, and is not just about how much you add from the salt shaker.  See the handout on sleep hygiene for ways to improve your sleeping.  Call to schedule your mammogram.  Be well, Dr. Pollie Meyer

## 2012-12-24 DIAGNOSIS — I10 Essential (primary) hypertension: Secondary | ICD-10-CM | POA: Insufficient documentation

## 2012-12-24 NOTE — Assessment & Plan Note (Addendum)
BP initially elevated to 159/95 here, then improved to 144/94 on my recheck. Patient has never been diagnosed with hypertension previously. From chart review, she has had some borderline elevated blood pressures previously. Will plan for her to return within the next 1-2 weeks for labs (lipids, CMET, a1c) at which time we will also have a nurse check her blood pressure. If it remains elevated will likely discuss starting a medication to treat hypertension. Counseled patient that weight loss and restriction of salt intake will help lower her blood pressure. She will schedule a f/u visit with me in 1 month.

## 2012-12-24 NOTE — Assessment & Plan Note (Signed)
Has not had a1c in > 1 year. Will check when she returns for fasting labs.

## 2012-12-24 NOTE — Progress Notes (Signed)
Name: Barbara Reese Age/Sex: 47 y.o. female  HPI:  Patient presents today to meet her new doctor. She recently came for a work physical with Dr. Lum Babe.   Depression/Anxiety: Restarted wellbutrin about one month ago. She is doing well and feeling well. Says she feels a lot better with the wellbutrin and is happy with her current dose. Denies SI/HI.  Sleep problems: previously diagnosed with sleep apnea and was given a CPAP but she cannot tolerate it. States she cannot sleep well. Does all right with going to to sleep but wakes up many times during the night. Requests a sleep aid medication.  Blood pressure: has never been diagnosed with hypertension before. Occasionally has a headache, about twice per week, which she attributes to the stress of work. 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 Denies chest pain or shortness of breath.  ROS: See HPI - no chest pain or shortness of breath. + difficulty sleeping.  PMFSH:  Walks 2-3 times per week. Does not smoke. Occasional alcohol use, 2-4 times per month with 1-2 drinks at a time. Never has greater than 6 drinks at a time. Not currently using any illicit drugs or rx drugs not prescribed to her. Feels safe in her relationships.  PHYSICAL EXAM: BP 144/94  Pulse 80  Ht 5\' 6"  (1.676 m)  Wt 258 lb (117.028 kg)  BMI 41.66 kg/m2 Gen: NAD, pleasant, cooperative HEENT: normocephalic, atraumatic, no cervical lymphadenopathy appreciable Heart: RRR Lungs: CTAB, NWOB Abd: soft, nontender to palpation, no masses appreciable Neuro: grossly nonfocal. Speech intact. Psych: pleasant affect. Normal eye contact. Well groomed. Speech is normal in rate and volume. Denies SI/HI.  ASSESSMENT/PLAN:  # Health maintenance:  -due for mammogram - patient will call and schedule, says she knows how to schedule it -up to date on pap smear

## 2012-12-24 NOTE — Assessment & Plan Note (Signed)
Pt reports never being on cholesterol medications before. Will have her come back one morning for a fasting lipid panel and CMET.

## 2012-12-24 NOTE — Assessment & Plan Note (Signed)
Patient reports difficulty with staying asleep at night and requests a sleep aid medication. After discussing with Dr. Leveda Anna, I do not feel comfortable recommending a sleep medicine to her given that she has untreated sleep apnea (says she cannot tolerate CPAP). Do not want to over-sedate her and suppress protective awakenings from apnea. Explained this to patient, who understood. I have given her a handout on sleep hygiene and recommended she follow those tips.

## 2012-12-24 NOTE — Assessment & Plan Note (Signed)
Stable on wellbutrin. Will continue this medication.

## 2012-12-30 ENCOUNTER — Other Ambulatory Visit (INDEPENDENT_AMBULATORY_CARE_PROVIDER_SITE_OTHER): Payer: 59

## 2012-12-30 DIAGNOSIS — R7303 Prediabetes: Secondary | ICD-10-CM

## 2012-12-30 DIAGNOSIS — R7309 Other abnormal glucose: Secondary | ICD-10-CM

## 2012-12-30 DIAGNOSIS — E7800 Pure hypercholesterolemia, unspecified: Secondary | ICD-10-CM

## 2012-12-30 LAB — COMPREHENSIVE METABOLIC PANEL
AST: 16 U/L (ref 0–37)
Albumin: 4.1 g/dL (ref 3.5–5.2)
Alkaline Phosphatase: 43 U/L (ref 39–117)
BUN: 8 mg/dL (ref 6–23)
Creat: 0.91 mg/dL (ref 0.50–1.10)
Glucose, Bld: 108 mg/dL — ABNORMAL HIGH (ref 70–99)
Total Bilirubin: 0.6 mg/dL (ref 0.3–1.2)

## 2012-12-30 LAB — LIPID PANEL
Cholesterol: 195 mg/dL (ref 0–200)
HDL: 63 mg/dL
LDL Cholesterol: 120 mg/dL — ABNORMAL HIGH (ref 0–99)
Total CHOL/HDL Ratio: 3.1 ratio
Triglycerides: 61 mg/dL
VLDL: 12 mg/dL (ref 0–40)

## 2012-12-30 LAB — POCT GLYCOSYLATED HEMOGLOBIN (HGB A1C): Hemoglobin A1C: 6.1

## 2012-12-30 NOTE — Progress Notes (Signed)
CMP,FLP AND A1C DONE TODAY Barbara Reese 

## 2012-12-30 NOTE — Progress Notes (Signed)
Pt stated that MD wanted to have BP done prior to taking labs today - BP 145/80, P of 88. No further concerns or questions Wyatt Haste, RN-BSN

## 2013-01-06 ENCOUNTER — Encounter: Payer: Self-pay | Admitting: Family Medicine

## 2013-01-09 ENCOUNTER — Other Ambulatory Visit: Payer: Self-pay | Admitting: Family Medicine

## 2013-01-18 ENCOUNTER — Other Ambulatory Visit: Payer: Self-pay | Admitting: Family Medicine

## 2013-02-06 ENCOUNTER — Ambulatory Visit: Payer: 59 | Admitting: Family Medicine

## 2013-04-05 ENCOUNTER — Other Ambulatory Visit: Payer: Self-pay | Admitting: Family Medicine

## 2013-04-07 ENCOUNTER — Telehealth: Payer: Self-pay | Admitting: *Deleted

## 2013-04-07 NOTE — Telephone Encounter (Signed)
Requesting refill on lorazepam - not on current MAR - please advise. Wyatt Haste, RN-BSN

## 2013-04-07 NOTE — Telephone Encounter (Signed)
Patient needs appointment to discuss this. She was supposed to follow up after her visit in May but has not returned. I have never prescribed lorazepam for her, so need to discuss why she would need it. Please inform patient.  Latrelle Dodrill, MD

## 2013-04-10 NOTE — Telephone Encounter (Signed)
Called pt. Advised to schedule OV with Dr.McIntyre. Pt agreed and will do that. Barbara Reese, [name]

## 2013-04-19 ENCOUNTER — Encounter (HOSPITAL_COMMUNITY): Payer: Self-pay | Admitting: *Deleted

## 2013-04-19 ENCOUNTER — Other Ambulatory Visit: Payer: Self-pay | Admitting: *Deleted

## 2013-04-19 ENCOUNTER — Emergency Department (INDEPENDENT_AMBULATORY_CARE_PROVIDER_SITE_OTHER)
Admission: EM | Admit: 2013-04-19 | Discharge: 2013-04-19 | Disposition: A | Discharge status: Home / Self Care | Payer: 59 | Source: Home / Self Care | Attending: Emergency Medicine | Admitting: Emergency Medicine

## 2013-04-19 DIAGNOSIS — B349 Viral infection, unspecified: Secondary | ICD-10-CM

## 2013-04-19 DIAGNOSIS — B9789 Other viral agents as the cause of diseases classified elsewhere: Secondary | ICD-10-CM

## 2013-04-19 MED ORDER — DOCOSANOL 10 % EX CREA: TOPICAL_CREAM | CUTANEOUS | Status: DC

## 2013-04-19 MED ORDER — CHLORPHEN-PSEUDOEPHED-APAP 2-30-500 MG PO TABS: 2.0000 | ORAL_TABLET | Freq: Four times a day (QID) | ORAL | Status: DC | PRN

## 2013-04-19 NOTE — ED Provider Notes (Addendum)
CSN: [id number]     Arrival date & time 04/19/13  1634 History   None    Chief Complaint  Patient presents with  . Mouth Lesions   (Consider location/radiation/quality/duration/timing/severity/associated sxs/prior Treatment) HPI Comments: 47 year old female presents for evaluation of a lesion on her lower left lip. 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 Additionally, she admits to headache, fatigue, dry cough, runny nose, and itchy left palm. She also has some mild left ear pain that began one week ago. She has not tried taking any over-the-counter medications to treat her symptoms. She has no history of cold sores. She denies any fever, chills, rash, shortness of breath, NVD. Her cough is nonproductive. She has no recent travel.   Patient is a 47 y.o. female presenting with mouth sores.  Mouth Lesions Associated symptoms: congestion, ear pain and rhinorrhea   Associated symptoms: no fever, no neck pain, no rash and no sore throat     Past Medical History  Diagnosis Date  . Anemia   . Allergic rhinitis   . Depressive disorder   . Insomnia   . Osteoarthritis   . GERD (gastroesophageal reflux disease)   . OSA (obstructive sleep apnea)   . 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 Cholecystectomy  1987  . Knee surgery  2004    left knee- Dr. Eulah Pont   Family History  Problem Relation Age of Onset  . Osteoarthritis Mother   . Hypertension Mother   . Diabetes Father   . Hypertension Sister   . Heart attack Brother   . Heart attack Sister   . Allergies Mother   . Allergies Son   . Allergies Sister   . Asthma Son   . Clotting disorder Sister   . Rheum arthritis Mother    History  Substance Use Topics  . Smoking status: Never Smoker   . Smokeless tobacco: Never Used  . Alcohol Use: Yes     Comment: 2 glasses per weekend    OB History   Grav Para Term Preterm Abortions TAB SAB Ect Mult Living                 Review of Systems  Constitutional: Positive for fatigue. Negative for fever and chills.  HENT: Positive for ear pain, congestion, rhinorrhea, mouth sores, postnasal drip and sinus pressure. Negative for sore throat, sneezing and neck pain.   Eyes: Negative for visual disturbance.  Respiratory: Positive for cough. Negative for chest tightness and shortness of breath.   Cardiovascular: Negative for chest pain, palpitations and leg swelling.  Gastrointestinal: Negative for nausea, vomiting and abdominal pain.  Endocrine: Negative for polydipsia and polyuria.  Genitourinary: Negative for dysuria, urgency and frequency.  Musculoskeletal: Negative for myalgias and arthralgias.  Skin: Negative for rash.  Neurological: Negative for dizziness, weakness and light-headedness.    Allergies  Review of patient's allergies indicates no known allergies.  Home Medications   Current Outpatient Rx  Name  Route  Sig  Dispense  Refill  . buPROPion (WELLBUTRIN XL) 300 MG 24 hr tablet      TAKE 1 TABLET BY MOUTH DAILY.   30 tablet   1   . EXPIRED: cetirizine (ZYRTEC) 10 MG tablet   Oral   Take 1 tablet (10 mg total) by mouth daily.   30 tablet  0   . chlorpheniramine-pseudoephedrine-acetaminophen (SINUTAB) 2-30-500 MG per tablet   Oral   Take 2 tablets by mouth 4 (four) times daily as needed for allergies.   42 tablet   0   . clobetasol ointment (TEMOVATE) 0.05 %   Topical   Apply topically daily. HISTORICAL MEDICATION PER PATIENT         . Docosanol (ABREVA) 10 % CREA      Apply to lesion 5 times daily   2 g   0   . docusate sodium (COLACE) 100 MG capsule   Oral   Take 100 mg by mouth 2 (two) times daily.           . ferrous sulfate (MEIJER FERROUS SULFATE) 325 (65 FE) MG tablet   Oral   Take 325 mg by mouth 2 (two) times daily.           . fluticasone (FLONASE) 50 MCG/ACT nasal  spray   Nasal   Place 1 spray into the nose daily.   16 g   0   . pantoprazole (PROTONIX) 40 MG tablet      TAKE 1 TABLET BY MOUTH DAILY.   30 tablet   2    BP 130/88  Pulse 82  Temp(Src) 100 F (37.8 C) (Oral)  SpO2 100%  LMP 03/29/2013 Physical Exam  Nursing note and vitals reviewed. Constitutional: She is oriented to person, place, and time. She appears well-developed and well-nourished. No distress.  HENT:  Head: Normocephalic and atraumatic.  Right Ear: Hearing, tympanic membrane, external ear and ear canal normal.  Left Ear: Hearing, tympanic membrane, external ear and ear canal normal.  Nose: Nose normal.  Mouth/Throat: Oropharynx is clear and moist. No oropharyngeal exudate.    Eyes: Conjunctivae are normal. Pupils are equal, round, and reactive to light.  Cardiovascular: Normal rate, regular rhythm and normal heart sounds.  Exam reveals no gallop and no friction rub.   No murmur heard. Pulmonary/Chest: Effort normal and breath sounds normal. No respiratory distress. She has no wheezes. She has no rales.  Musculoskeletal: Normal range of motion.  Neurological: She is alert and oriented to person, place, and time. Coordination normal.  Skin: Skin is warm and dry. No rash noted. She is not diaphoretic.  Psychiatric: She has a normal mood and affect. Judgment normal.    ED Course  Procedures (including critical care time) Labs Review Labs Reviewed - No data to display Imaging Review No results found.  MDM   1. Viral illness    Treat symptomatically. Also may use Abreva in case this is beginning of a cold sore on the lip. Followup if worsening   Meds ordered this encounter  Medications  . chlorpheniramine-pseudoephedrine-acetaminophen (SINUTAB) 2-30-500 MG per tablet    Sig: Take 2 tablets by mouth 4 (four) times daily as needed for allergies.    Dispense:  42 tablet    Refill:  0  . Docosanol (ABREVA) 10 % CREA    Sig: Apply to lesion 5 times daily     Dispense:  2 g    Refill:  0       Graylon Good, PA-C 04/19/13 1735  Graylon Good, PA-C 04/20/13 [phone]

## 2013-04-19 NOTE — ED Notes (Signed)
Pt  Works  In  Day  Care   She  Reports  A  Cold  Sore  On the    Lip  That has  Been there  For 2  Days

## 2013-04-19 NOTE — ED Provider Notes (Signed)
Medical screening examination/treatment/procedure(s) were performed by non-physician practitioner and as supervising physician I was immediately available for consultation/collaboration.  Raynald Blend, MD 04/19/13 [phone]

## 2013-04-20 NOTE — ED Provider Notes (Signed)
Medical screening examination/treatment/procedure(s) were performed by non-physician practitioner and as supervising physician I was immediately available for consultation/collaboration.  Raynald Blend, MD 04/20/13 1124

## 2013-04-21 ENCOUNTER — Other Ambulatory Visit: Payer: Self-pay | Admitting: *Deleted

## 2013-05-02 ENCOUNTER — Encounter: Payer: Self-pay | Admitting: Family Medicine

## 2013-05-02 ENCOUNTER — Ambulatory Visit (INDEPENDENT_AMBULATORY_CARE_PROVIDER_SITE_OTHER): Payer: 59 | Admitting: Family Medicine

## 2013-05-02 ENCOUNTER — Other Ambulatory Visit: Payer: Self-pay | Admitting: Family Medicine

## 2013-05-02 VITALS — BP 132/92 | HR 102 | Ht 66.0 in | Wt 257.0 lb

## 2013-05-02 DIAGNOSIS — F32A Depression, unspecified: Secondary | ICD-10-CM

## 2013-05-02 DIAGNOSIS — L439 Lichen planus, unspecified: Secondary | ICD-10-CM

## 2013-05-02 DIAGNOSIS — F3289 Other specified depressive episodes: Secondary | ICD-10-CM

## 2013-05-02 DIAGNOSIS — F329 Major depressive disorder, single episode, unspecified: Secondary | ICD-10-CM

## 2013-05-02 DIAGNOSIS — R030 Elevated blood-pressure reading, without diagnosis of hypertension: Secondary | ICD-10-CM

## 2013-05-02 DIAGNOSIS — L430 Hypertrophic lichen planus: Secondary | ICD-10-CM

## 2013-05-02 MED ORDER — TRIAMCINOLONE ACETONIDE 0.1 % EX CREA: TOPICAL_CREAM | Freq: Two times a day (BID) | CUTANEOUS | Status: DC | PRN

## 2013-05-02 NOTE — Patient Instructions (Addendum)
It was great to see you again today!  Let me know within a week if you want to start a depression medicine. Call [facility] to schedule your mammogram. I recommend you get a flu shot, whether that be here or through your work. Get back in with your dermatologist to discuss the clobetasol. I did send in a prescription for triamcinolone ointment. Do not use both of these medicines together. Just use this as needed as it can cause thinning and lightening of the skin.  Have the employee health office forward [place] your shot records.  Come back in 2 weeks for a nurse BP check (schedule the appointment at 1:30pm) during your lunch break.  Be well, Dr. Pollie Meyer

## 2013-05-03 ENCOUNTER — Other Ambulatory Visit: Payer: Self-pay | Admitting: Family Medicine

## 2013-05-03 DIAGNOSIS — Z1231 Encounter for screening mammogram for malignant neoplasm of breast: Secondary | ICD-10-CM

## 2013-05-03 MED ORDER — LORAZEPAM 1 MG PO TABS: 0.5000 mg | ORAL_TABLET | ORAL | Status: AC

## 2013-05-03 NOTE — Assessment & Plan Note (Addendum)
Not currently on any medicines because she ran out of wellbutrin. Pt is unsure if she wants to restart medicine. I have discussed with her alternative options to wellbutrin (specifically zoloft) and encouraged her to call me within one week if she decides she wants to start the medicine.   I will authorize a refill of five pills of ativan for use with situational anxiety associated with flying.

## 2013-05-03 NOTE — Assessment & Plan Note (Signed)
BP intermittently elevated here in clinic. First 150/94 (pulse 71) then 132/92 (pulse 102) then around 130/100 (normal pulse). Advised patient that she may be approaching [place] having to put her on a BP medicine. She prefers to return for a nurse visit in a few weeks to recheck the BP. If remains elevated at that time, I am going to recommend we start her at least on low dose HCTZ to try and bring her BP down.

## 2013-05-03 NOTE — Progress Notes (Signed)
Patient ID: Barbara Reese, female   DOB: December 10, 1965, 47 y.o.   MRN: [id number]   HPI:  Elevated BP: not currently on any medications for elevated BP, has never been diagnosed with HTN before. She does not check her BP at home. Denies chest pain, SOB, vision changes, leg swelling. Endorses occasional headaches. Also endorses a fluttering sensation about 1-2 times per week that lasts a few seconds.  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 Has been seen by dermatology before and had skin biopsies. Uses clobetasol about every other month for a few days at a time whenever it flares up, which she can tell because it itches.  Depression: has been out of wellbutrin for one month so has not been taking it. Says her mood is okay, but not great. She is functioning okay from day to day and knows the depression is still there. She's not sure if she wants to restart a medicine. She felt like wellbutrin worked for her, but she always gets a bad headache whenever she starts it back. This HA eventually wears off. Denies SI/HI. She also requests a refill on a small dose of lorazepam, which she uses when she flies on an upcoming airplane. She has an upcoming trip.   ROS: See HPI  PMFSH: works at the [employer]  PHYSICAL EXAM: BP 132/92  Pulse 102  Ht 5\' 6"  (1.676 m)  Wt 257 lb (116.574 kg)  BMI 41.5 kg/m2  LMP 03/29/2013 Gen: NAD Heart: RRR Lungs: CTAB Neuro: nonfocal grossly, speech intact Psych: Cognition and judgment appear intact. Alert, communicative  and cooperative with normal attention span and concentration. No apparent delusions, illusions, hallucinations   ASSESSMENT/PLAN:  # Health maintenance:  - recommended pt call to schedule mammogram, she states she knows the # to call - recommended flu shot to patient, she wants to wait and get it through her work - requested she have the [employer] employee health office send [place] her immunization records  so that we can keep them updated in our system.

## 2013-05-03 NOTE — Assessment & Plan Note (Addendum)
Pt requests refill of clobetasol, which is a super high potency topical steroid. I explained to her that this is not really within the primary care realm to prescribe, and recommended that she follow up with her dermatologist. I did give her a prescription for triamcinolone, which is lower potency than clobetasol but which she can use in the meantime. Instructed her not to use these medications together.

## 2013-05-19 ENCOUNTER — Ambulatory Visit (HOSPITAL_COMMUNITY)
Admission: RE | Admit: 2013-05-19 | Discharge: 2013-05-19 | Disposition: A | Discharge status: Home / Self Care | Payer: 59 | Source: Ambulatory Visit | Attending: Family Medicine | Admitting: Family Medicine

## 2013-05-19 ENCOUNTER — Other Ambulatory Visit: Payer: Self-pay | Admitting: Family Medicine

## 2013-05-19 ENCOUNTER — Ambulatory Visit: Payer: 59 | Admitting: Family Medicine

## 2013-05-19 DIAGNOSIS — Z1231 Encounter for screening mammogram for malignant neoplasm of breast: Secondary | ICD-10-CM | POA: Insufficient documentation

## 2013-05-19 DIAGNOSIS — I10 Essential (primary) hypertension: Secondary | ICD-10-CM

## 2013-05-19 NOTE — Progress Notes (Signed)
Patient ID: Barbara Reese, female   DOB: 05/04/1966, 47 y.o.   MRN: [id number] Pt here for a BP check today.  Weight is 250 lb and BP is 145/78. Pulse is 78. Pt states she is doing well, no complaints, just returned from a  vacation today.  Pt states MD discussed starting BP meds if BP elevated today.  Will forward to MD and call patient back with advice.   Pt's number is [phone] and uses [facility].    Bartlomiej Jenkinson, Darlyne Russian, CMA

## 2013-05-24 MED ORDER — HYDROCHLOROTHIAZIDE 25 MG PO TABS: 25.0000 mg | ORAL_TABLET | Freq: Every day | ORAL | Status: DC

## 2013-05-24 NOTE — Assessment & Plan Note (Signed)
Will start HCTZ 25mg  daily. F/u in 2-3 weeks to recheck BP and see how she is doing with the new medicine.

## 2013-05-24 NOTE — Progress Notes (Signed)
Patient ID: Barbara Reese, female   DOB: 1965/11/06, 47 y.o.   MRN: [id number]  Called patient to discuss blood pressure. As blood pressure remains elevated, I have recommended to patient that she start HCTZ 25 mg daily. She is willing to do this. Discussed possible side effect of increased urination, and recommend that she take in the morning. Instructed her to schedule a followup appointment for 2-3 weeks from now so we can recheck her blood pressure and see how she is tolerating the medicine. Will send in prescription to her pharmacy now.  Latrelle Dodrill, MD

## 2013-05-31 ENCOUNTER — Telehealth: Payer: Self-pay | Admitting: Family Medicine

## 2013-05-31 NOTE — Telephone Encounter (Signed)
Patient calls stating she is not tolerating new med (HYDRODIURIL). She has been nauseated and states she did not take dosage today. Please advise patient on what to do.

## 2013-05-31 NOTE — Telephone Encounter (Signed)
Will forward to Dr McIntyre 

## 2013-06-01 MED ORDER — AMLODIPINE BESYLATE 5 MG PO TABS: 5.0000 mg | ORAL_TABLET | Freq: Every day | ORAL | Status: DC

## 2013-06-01 NOTE — Telephone Encounter (Signed)
If she is not tolerating the HCTZ we can try a different medication. I sent in a prescription for amlodipine 5mg  daily. Please call her and inform her. I would like for her to be seen in 2 weeks to recheck her BP. I asked her to schedule an appointment but I see that this hasn't been scheduled yet. Thanks!  Latrelle Dodrill, MD

## 2013-06-01 NOTE — Telephone Encounter (Signed)
Called pt. Informed. Pt agreed. appt 06/20/13. Lorenda Hatchet, [name]

## 2013-06-20 ENCOUNTER — Encounter: Payer: Self-pay | Admitting: Family Medicine

## 2013-06-20 ENCOUNTER — Ambulatory Visit (INDEPENDENT_AMBULATORY_CARE_PROVIDER_SITE_OTHER): Payer: 59 | Admitting: Family Medicine

## 2013-06-20 VITALS — BP 130/82 | HR 93 | Temp 98.5°F | Ht 66.0 in | Wt 255.0 lb

## 2013-06-20 DIAGNOSIS — I10 Essential (primary) hypertension: Secondary | ICD-10-CM

## 2013-06-20 DIAGNOSIS — L430 Hypertrophic lichen planus: Secondary | ICD-10-CM

## 2013-06-20 DIAGNOSIS — L439 Lichen planus, unspecified: Secondary | ICD-10-CM

## 2013-06-20 DIAGNOSIS — Z5181 Encounter for therapeutic drug level monitoring: Secondary | ICD-10-CM

## 2013-06-20 LAB — BASIC METABOLIC PANEL
BUN: 12 mg/dL (ref 6–23)
CO2: 27 mEq/L (ref 19–32)
Chloride: 102 mEq/L (ref 96–112)
Creat: 0.81 mg/dL (ref 0.50–1.10)
Glucose, Bld: 94 mg/dL (ref 70–99)

## 2013-06-20 MED ORDER — PANTOPRAZOLE SODIUM 40 MG PO TBEC: 40.0000 mg | DELAYED_RELEASE_TABLET | Freq: Every day | ORAL | Status: DC

## 2013-06-20 MED ORDER — CLOBETASOL PROPIONATE 0.05 % EX OINT: TOPICAL_OINTMENT | Freq: Every day | CUTANEOUS | Status: DC | PRN

## 2013-06-20 MED ORDER — HYDROCHLOROTHIAZIDE 12.5 MG PO TABS: 12.5000 mg | ORAL_TABLET | Freq: Every day | ORAL | Status: DC

## 2013-06-20 MED ORDER — DOCUSATE SODIUM 100 MG PO CAPS: 100.0000 mg | ORAL_CAPSULE | Freq: Two times a day (BID) | ORAL | Status: DC | PRN

## 2013-06-20 NOTE — Assessment & Plan Note (Signed)
Blood pressure much improved after starting HCTZ 12.5 mg daily. Will continue with this. Followup in 3 months.

## 2013-06-20 NOTE — Progress Notes (Signed)
Patient ID: Barbara Reese, female   DOB: [birth year], 47 y.o.   MRN: [id number]  HPI:  Hypertension: Previously was started on HCTZ 25 mg daily, however states that she was not able to tolerate this due to nausea and fatigue. She spoke with her pharmacist recommended she take just half of a pill. She's been taking the one half of a pill and has been tolerating that well. She denies having any chest pain, shortness of breath, lightheadedness, vision changes, or headache. She is not checking her blood pressure at home.   Lichen planus: Previously was using clobetasol 0.05% ointment, which improved her symptoms. I have asked her to be seen by her dermatologist to obtain a refill of this medicine, but she says that she called and was not able to get in for 3-4 months. The triamcinolone cream that I prescribed for her is not working. She requests a refill of the clobetasol. Her skin has gotten worse, and is very itchy.  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 History of taking iron for iron deficiency anemia, requests refill of Colace to assist with constipation due to iron. Also requests refill of Protonix.  PHYSICAL EXAM: BP 130/82  Pulse 93  Temp(Src) 98.5 F (36.9 C) (Oral)  Ht 5\' 6"  (1.676 m)  Wt 255 lb (115.667 kg)  BMI 41.18 kg/m2 Gen: No acute distress, pleasant and cooperative HEENT: Normocephalic, atraumatic Heart: Regular rate and rhythm, no murmurs Lungs: Clear to auscultation bilaterally, normal respiratory effort Abdomen: Soft, nontender to palpation. Neuro: Grossly nonfocal, speech intact Extremities: No appreciable lower extremity edema bilaterally Skin: Multiple areas of hypertrophic hyperpigmented crusty lesions on the legs.  ASSESSMENT/PLAN:  # Health maintenance:  -Patient reports she has already received flu shot at work  # Refills: -Refilled Colace and Protonix per patient request.  See problem based charting for additional  assessment/plan.  FOLLOW UP: F/u in 3 months for blood pressure.  [place] J. Pollie Meyer, MD [facility]

## 2013-06-20 NOTE — Assessment & Plan Note (Signed)
Symptoms worsened due to being out of the clobetasol. The triamcinolone and I prescribed previously is not helping. After discussion with Dr. Deirdre Priest, will refill her clobetasol ointment to get her to her next dermatology appointment. I have asked her to schedule an appointment with them to be seen for future refills.

## 2013-06-20 NOTE — Patient Instructions (Signed)
It was great to see you again today!  I refilled all your medicines. Follow up in 3 months. I will call you if your test results are not normal.  Otherwise, I will send you a letter.  If you do not hear from me with in 2 weeks please call our office.      Be well, Dr. Pollie Meyer

## 2013-06-21 ENCOUNTER — Encounter: Payer: Self-pay | Admitting: Family Medicine

## 2013-08-09 ENCOUNTER — Encounter: Payer: Self-pay | Admitting: Family Medicine

## 2013-08-09 ENCOUNTER — Ambulatory Visit (INDEPENDENT_AMBULATORY_CARE_PROVIDER_SITE_OTHER): Payer: 59 | Admitting: Family Medicine

## 2013-08-09 VITALS — BP 138/90 | HR 85 | Temp 98.2°F | Ht 66.0 in | Wt 254.0 lb

## 2013-08-09 DIAGNOSIS — L723 Sebaceous cyst: Secondary | ICD-10-CM

## 2013-08-09 DIAGNOSIS — J329 Chronic sinusitis, unspecified: Secondary | ICD-10-CM

## 2013-08-09 MED ORDER — AZITHROMYCIN 500 MG PO TABS: 500.0000 mg | ORAL_TABLET | Freq: Every day | ORAL | Status: DC

## 2013-08-09 NOTE — Patient Instructions (Signed)
It was great to see you again today!  I sent in an antibiotic to treat you for a sinus infection. Take it daily for 3 days. If not better by Friday/Monday, return for another visit.  If you decide you want to have the cyst removed, call the clinic and schedule a procedure visit to have it removed.  Follow up in 2 months for your blood pressure.  Be well, Dr. Pollie Meyer   Sinusitis Sinusitis is redness, soreness, and swelling (inflammation) of the paranasal sinuses. Paranasal sinuses are air pockets within the bones of your face (beneath the eyes, the middle of the forehead, or above the eyes). In healthy paranasal sinuses, mucus is able to drain out, and air is able to circulate through them by way of your nose. However, when your paranasal sinuses are inflamed, mucus and air can become trapped. This can allow bacteria and other germs to grow and cause infection. Sinusitis can develop quickly and last only a short time (acute) or continue over a long period (chronic). Sinusitis that lasts for more than 12 weeks is considered chronic.  CAUSES  Causes of sinusitis include:  Allergies.  Structural abnormalities, such as displacement of the cartilage that separates your nostrils (deviated septum), which can decrease the air flow through your nose and sinuses and affect sinus drainage.  Functional abnormalities, such as when the small hairs (cilia) that line your sinuses and help remove mucus do not work properly or are not present. SYMPTOMS  Symptoms of acute and chronic sinusitis are the same. The primary symptoms are pain and pressure around the affected sinuses. Other symptoms include:  Upper toothache.  Earache.  Headache.  Bad breath.  Decreased sense of smell and taste.  A cough, which worsens when you are lying flat.  Fatigue.  Fever.  Thick drainage from your nose, which often is green and may contain pus (purulent).  Swelling and warmth over the affected  sinuses. DIAGNOSIS  Your caregiver will perform a physical exam. During the exam, your caregiver may:  Look in your nose for signs of abnormal growths in your nostrils (nasal polyps).  Tap over the affected sinus to check for signs of infection.  View the inside of your sinuses (endoscopy) with a special imaging device with a light attached (endoscope), which is inserted into your sinuses. If your caregiver suspects that you have chronic sinusitis, one or more of the following tests may be recommended:  Allergy tests.  Nasal culture A sample of mucus is taken from your nose and sent to a lab and screened for bacteria.  Nasal cytology A sample of mucus is taken from your nose and examined by your caregiver to determine if your sinusitis is related to an allergy. TREATMENT  Most cases of acute sinusitis are related to a viral infection and will resolve on their own within 10 days. Sometimes medicines are prescribed to help relieve symptoms (pain medicine, decongestants, nasal steroid sprays, or saline sprays).  However, for sinusitis related to a bacterial infection, your caregiver will prescribe antibiotic medicines. These are medicines that will help kill the bacteria causing the infection.  Rarely, sinusitis is caused by a fungal infection. In theses cases, your caregiver will prescribe antifungal medicine. For some cases of chronic sinusitis, surgery is needed. Generally, these are cases in which sinusitis recurs more than 3 times per year, despite other treatments. HOME CARE INSTRUCTIONS   Drink plenty of water. Water helps thin the mucus so your sinuses can drain more easily.  Use a humidifier.  Inhale steam 3 to 4 times a day (for example, sit in the bathroom with the shower running).  Apply a warm, moist washcloth to your face 3 to 4 times a day, or as directed by your caregiver.  Use saline nasal sprays to help moisten and clean your sinuses.  Take over-the-counter or  prescription medicines for pain, discomfort, or fever only as directed by your caregiver. SEEK IMMEDIATE MEDICAL CARE IF:  You have increasing pain or severe headaches.  You have nausea, vomiting, or drowsiness.  You have swelling around your face.  You have vision problems.  You have a stiff neck.  You have difficulty breathing. MAKE SURE YOU:   Understand these instructions.  Will watch your condition.  Will get help right away if you are not doing well or get worse. Document Released: 08/10/2005 Document Revised: 11/02/2011 Document Reviewed: 08/25/2011 [facility] Patient Information 2014 [place], [place].   Epidermal Cyst An epidermal cyst is sometimes called a sebaceous cyst, epidermal inclusion cyst, or infundibular cyst. These cysts usually contain a substance that looks "pasty" or "cheesy" and may have a bad smell. This substance is a protein called keratin. Epidermal cysts are usually found on the face, neck, or trunk. They may also occur in the vaginal area or other parts of the genitalia of both men and women. Epidermal cysts are usually small, painless, slow-growing bumps or lumps that move freely under the skin. It is important not to try to pop them. This may cause an infection and lead to tenderness and swelling. CAUSES  Epidermal cysts may be caused by a deep penetrating injury to the skin or a plugged hair follicle, often associated with acne. SYMPTOMS  Epidermal cysts can become inflamed and cause:  Redness.  Tenderness.  Increased temperature of the skin over the bumps or lumps.  Grayish-white, bad smelling material that drains from the bump or lump. DIAGNOSIS  Epidermal cysts are easily diagnosed by your caregiver during an exam. Rarely, a tissue sample (biopsy) may be taken to rule out other conditions that may resemble epidermal cysts. TREATMENT   Epidermal cysts often get better and disappear on their own. They are rarely ever cancerous.  If a cyst  becomes infected, it may become inflamed and tender. This may require opening and draining the cyst. Treatment with antibiotics may be necessary. When the infection is gone, the cyst may be removed with minor surgery.  Small, inflamed cysts can often be treated with antibiotics or by injecting steroid medicines.  Sometimes, epidermal cysts become large and bothersome. If this happens, surgical removal in your caregiver's office may be necessary. HOME CARE INSTRUCTIONS  Only take over-the-counter or prescription medicines as directed by your caregiver.  Take your antibiotics as directed. Finish them even if you start to feel better. SEEK MEDICAL CARE IF:   Your cyst becomes tender, red, or swollen.  Your condition is not improving or is getting worse.  You have any other questions or concerns. MAKE SURE YOU:  Understand these instructions.  Will watch your condition.  Will get help right away if you are not doing well or get worse. Document Released: 07/11/2004 Document Revised: 11/02/2011 Document Reviewed: 02/16/2011 [facility] Patient Information 2014 [place], [place].

## 2013-08-09 NOTE — Progress Notes (Signed)
Patient ID: Barbara Reese, female   DOB: [birth year], 48 y.o.   MRN: [id number]  HPI:  Spot on back: has noticed a knot on the right side of her lower back. It has been present for one month. Sometimes it gets bigger in size. Did not injure the area. Sometimes gets pain in that area that radiates down to her leg. Denies any problems with urination, stooling, or saddle anesthesia.   URI: started 10 days ago with nasal congestion. Couldn't breathe out of nose, next thing she knew blowing nose all the time, drainage in throat, coughing at night. The cough has been productive of yellow mucous. Started OTC treatment w/ alkaseltzer cold, then nyquil. Still has thick yellow stuff in nose. L nostril is also sore. Has tried using a humidifer and saline nasal spray as well. Thinks she might overall be just slightly better but is no where close to how she normally feels and she expected to feel much better by now.  ROS: See HPI  PMFSH: hx HTN  PHYSICAL EXAM: BP 138/90  Pulse 85  Temp(Src) 98.2 F (36.8 C) (Oral)  Ht 5\' 6"  (1.676 m)  Wt 254 lb (115.214 kg)  BMI 41.02 kg/m2  LMP 08/09/2013 Gen: NAD HEENT: NCAT, MMM, no anterior cervical lymphadenopathy, nasal turbinates red, no ulcerations visible in nares, no oropharyngeal exudates, no marked sinus tenderness, Heart: RRR Lungs: CTAB, NWOB Back: 3-4cm soft, fluctuant, nontender mass present in lumbar aspect of spine just R of midline, no erythema, no skin breakdown or drainage Neuro: grossly nonfocal, speech intact  ASSESSMENT/PLAN:  # URI: likely viral, but with symptoms x 10 days without much improvement will go ahead and treat for acute sinusitis with azithromycin 500mg  x3 days. Instructed pt to follow up if not improving within the next several days. Gave handout on sinusitis.  # Knot on back: most consistent with sebaceous cyst. Does not appear infected at this time. Discussed with pt that we can remove it if it is bothering her. Printed off  information for her regarding sebaceous cysts. She'll think about it and will let us know if she decides to have it removed.  FOLLOW UP: F/u in 2 months for HTN Also f/u in 3-4 days if not improved with azithromycin.  [place] J. Pollie Meyer, MD [facility]

## 2013-10-26 ENCOUNTER — Ambulatory Visit
Admission: RE | Admit: 2013-10-26 | Discharge: 2013-10-26 | Disposition: A | Discharge status: Home / Self Care | Payer: Medicare HMO | Source: Ambulatory Visit | Attending: Family Medicine | Admitting: Family Medicine

## 2013-10-26 ENCOUNTER — Ambulatory Visit
Admission: RE | Admit: 2013-10-26 | Discharge: 2013-10-26 | Disposition: A | Discharge status: Home / Self Care | Payer: 59 | Source: Ambulatory Visit | Attending: Family Medicine | Admitting: Family Medicine

## 2013-10-26 ENCOUNTER — Other Ambulatory Visit: Payer: Self-pay | Admitting: Family Medicine

## 2013-10-26 ENCOUNTER — Encounter: Payer: Self-pay | Admitting: Family Medicine

## 2013-10-26 ENCOUNTER — Ambulatory Visit (INDEPENDENT_AMBULATORY_CARE_PROVIDER_SITE_OTHER): Payer: Medicare HMO | Admitting: Family Medicine

## 2013-10-26 VITALS — BP 120/81 | HR 89 | Ht 66.0 in | Wt 258.0 lb

## 2013-10-26 DIAGNOSIS — M25561 Pain in right knee: Secondary | ICD-10-CM

## 2013-10-26 DIAGNOSIS — M1710 Unilateral primary osteoarthritis, unspecified knee: Secondary | ICD-10-CM | POA: Insufficient documentation

## 2013-10-26 DIAGNOSIS — R52 Pain, unspecified: Secondary | ICD-10-CM

## 2013-10-26 DIAGNOSIS — M25569 Pain in unspecified knee: Secondary | ICD-10-CM

## 2013-10-26 DIAGNOSIS — M179 Osteoarthritis of knee, unspecified: Secondary | ICD-10-CM | POA: Insufficient documentation

## 2013-10-26 MED ORDER — METHYLPREDNISOLONE ACETATE 40 MG/ML IJ SUSP
40.0000 mg | Freq: Once | INTRAMUSCULAR | Status: AC
  Administered 2013-10-26: 40 mg via INTRA_ARTICULAR

## 2013-10-26 MED ORDER — MELOXICAM 15 MG PO TABS: 15.0000 mg | ORAL_TABLET | Freq: Every day | ORAL | Status: DC

## 2013-10-26 NOTE — Progress Notes (Signed)
   Subjective:    Patient ID: Barbara Reese, female    DOB: Nov 27, 1965, 48 y.o.   MRN: [id number]  HPI  48 year old F who presents for evaluation of right knee pain.   Knee Pain:   > Laterality:  right > Onset: 2 weeks ago > Course: worsening throughout the day; gotten progressively worse over the last 2 weeks  > Character: crackling, popping > Inciting Event: none > Associated Symptoms: mild effusion; tenderness over medial aspect of the joint; no fever, chills, infection, warmth of joint; no buckling or locking of the joint; no significant amount of joint stiffness in the morning   > Alleviating: Mild improvement with Naproxen, but quickly fades > Exacerbating: Prolonged standing, sitting, walking; any type of activity  > Hx of injury:  No > Hx of imaging: 2012 4 view X ray showed mild osteoarthritis in medial and patellofemoral compartments    Review of Systems     Objective:   Physical Exam BP 120/81  Pulse 89  Ht 5\' 6"  (1.676 m)  Wt 258 lb (117.028 kg)  BMI 41.66 kg/m2  LMP 10/06/2013  Knee Exam:  Laterality: right Appearance: Slightly swollen, no erythema  Edema: YES, In the medial compartment of the knee   Tenderness: YES,  Medial aspect of the patellar bone  Range of Motion: Passive Extension: Limited and painful  Passive Flexion:Normal Active Extension: Able to slowly accomplish with considerable pain Active Flexion: Normal Laxity: none Maneuvers: Lachman's: not assessed McMurray's: negative Patellar Compression: positive   Strength:  Quadricep: 5/5 Hamstring: 5/5        Assessment & Plan:  Procedure: Knee Injection  Indication: Pain Relief Consent Obtained: yes  Laterality: Right Approach: lateral Area cleaned with alcohol. Skin anesthetized with topical refrigerant anesthetic spray. Joint injected with 40 mg depomedrol and 3 mL of 1% lidocaine. Bandage placed.  Complications: None Estimated Blood Loss: < 5 mL Patient tolerated procedure  well.

## 2013-10-26 NOTE — Assessment & Plan Note (Signed)
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  Supportive characteristics of this diagnosis---history of osteoarthritis, previous imaging in 2012 demonstrating osteoarthritis of the K knee, the fact that the pain is worse as the day goes on and made worse by prolonged standing/sitting, and the lack of history of overt trauma and systemic illness.  The osteoarthritis has not been relieved with OTC pain medications.    R knee osteoarthritis: 1. Meloxicam (Mobic) 2. Intraarticular steroid injection 3. F/u in 4 weeks to assess knee pain and consider surgery referral if not improved.

## 2013-10-26 NOTE — Patient Instructions (Signed)
It was nice to see you. I am sorry about the knee pain. We need to get X-rays to assess the arthritis. In the meantime, I hope that the steroid injection provides you relief. This should start to have it's maximum effects in a few days. You can also take the meloxicam as needed. I sent the script to your pharmacy.   Please come back in 4 weeks.   Sincerely,   Dr. Maricela Bo

## 2013-10-27 ENCOUNTER — Ambulatory Visit: Payer: Self-pay | Admitting: Family Medicine

## 2014-01-02 ENCOUNTER — Telehealth: Payer: Self-pay | Admitting: Family Medicine

## 2014-01-02 NOTE — Telephone Encounter (Signed)
Pt called because her allergies are very bad. She has a stopped up nose, running eyes, drainage in her throat. She would like anything that you can call in for relief. jw

## 2014-01-02 NOTE — Telephone Encounter (Signed)
Will fwd to PCP.   Barbara Reese [name], CMA   

## 2014-01-03 NOTE — Telephone Encounter (Signed)
Pt called again. She needs relief

## 2014-01-05 NOTE — Telephone Encounter (Signed)
Please inform pt that she can try OTC zyrtec once daily for allergies. If not improved with this or if she has a fever, she should come in to be seen. Thanks, [name], MD

## 2014-01-08 ENCOUNTER — Telehealth: Payer: Self-pay | Admitting: Family Medicine

## 2014-01-08 MED ORDER — HYDROCHLOROTHIAZIDE 12.5 MG PO TABS: 12.5000 mg | ORAL_TABLET | Freq: Every day | ORAL | Status: DC

## 2014-01-08 NOTE — Telephone Encounter (Signed)
Pt called because her BP medication needs to be in 90 day supply for her insurance to cover it. This also needs to be sent to [facility] on [address]. Please do this today since she is out of medication. jw

## 2014-01-08 NOTE — Telephone Encounter (Signed)
Pt called to check the status of her BP medication being in a 90 day supply. jw

## 2014-01-08 NOTE — Telephone Encounter (Signed)
Left message for patient to return call. Please read message from Dr [name].Jaymes Graff Busick

## 2014-01-08 NOTE — Telephone Encounter (Signed)
Rx sent in. Please inform patient, and also let her know that in the future she should give [place] more notice when she is running out of her medications.  [name], MD

## 2014-01-08 NOTE — Telephone Encounter (Signed)
Left message to return call. Please tell patient her Rx was sent to the pharmacy and that she needs to call the pharmacy for future refills at least a week before she runs out.Barbara Reese

## 2014-01-08 NOTE — Telephone Encounter (Signed)
Forward to PCP for refill.Robert L Busick  

## 2014-01-09 NOTE — Telephone Encounter (Signed)
Left voice message for patient to call back.Barbara Reese

## 2014-02-05 ENCOUNTER — Telehealth: Payer: Self-pay | Admitting: Family Medicine

## 2014-02-05 MED ORDER — CLOBETASOL PROPIONATE 0.05 % EX OINT: TOPICAL_OINTMENT | Freq: Every day | CUTANEOUS | Status: DC | PRN

## 2014-02-05 MED ORDER — HYDROCHLOROTHIAZIDE 12.5 MG PO TABS: 12.5000 mg | ORAL_TABLET | Freq: Every day | ORAL | Status: DC

## 2014-02-05 NOTE — Telephone Encounter (Signed)
Needs refill on Heat is agrravating skin

## 2014-02-05 NOTE — Telephone Encounter (Signed)
Called pharmacy and is being filled.  Please let pt know.  Thanks, Tamela Oddi. Awanda Mink, DO of [facility] 02/05/2014, 5:23 PM

## 2014-02-05 NOTE — Telephone Encounter (Signed)
Refill is on [facility]ol [facility] [address]

## 2014-02-05 NOTE — Telephone Encounter (Signed)
Received fax from [facility] stating that hydrochlorothiazide 12.5 mg cap requires a 90 day supply in order for insurance to cover.  Please give them a call at [phone] or fax a new Rx for 90 day supply [phone].  Derl Barrow, RN

## 2014-02-06 NOTE — Telephone Encounter (Signed)
Left voice message informing pt that medications were being filled.  Derl Barrow, RN

## 2014-02-15 ENCOUNTER — Encounter: Payer: Self-pay | Admitting: Family Medicine

## 2014-02-15 ENCOUNTER — Ambulatory Visit (INDEPENDENT_AMBULATORY_CARE_PROVIDER_SITE_OTHER): Payer: Medicare HMO | Admitting: Family Medicine

## 2014-02-15 VITALS — BP 132/82 | HR 80 | Temp 98.1°F | Wt 242.0 lb

## 2014-02-15 DIAGNOSIS — M25561 Pain in right knee: Secondary | ICD-10-CM

## 2014-02-15 DIAGNOSIS — M25569 Pain in unspecified knee: Secondary | ICD-10-CM

## 2014-02-15 DIAGNOSIS — I10 Essential (primary) hypertension: Secondary | ICD-10-CM

## 2014-02-15 MED ORDER — FLUTICASONE PROPIONATE 50 MCG/ACT NA SUSP: 1.0000 | Freq: Every day | NASAL | Status: DC

## 2014-02-15 MED ORDER — HYDROCODONE-ACETAMINOPHEN 5-325 MG PO TABS: 1.0000 | ORAL_TABLET | Freq: Four times a day (QID) | ORAL | Status: DC | PRN

## 2014-02-15 MED ORDER — METHYLPREDNISOLONE ACETATE 40 MG/ML IJ SUSP
40.0000 mg | Freq: Once | INTRAMUSCULAR | Status: AC
  Administered 2014-02-15: 40 mg via INTRA_ARTICULAR

## 2014-02-15 NOTE — Assessment & Plan Note (Signed)
Patient with known tricompartmental osteoarthritis. We attempted a knee injection today, however were unable to enter the joint space medially or laterally. Patient was tearful after this attempted procedure, as she really would like to avoid surgery for as long as possible. Will refer her to sports medicine for additional evaluation, and possibly a ultrasound-guided knee injection. We discussed that she will likely eventually need a joint replacement surgery. I will give her a short course of hydrocodone to relieve her pain since we did insert a needle twice into her knee today.

## 2014-02-15 NOTE — Patient Instructions (Addendum)
We attempted a knee injection today but were unable to get in your joint space. I am referring you to sports medicine center for your knee arthritis. You will get a phone call to schedule this appointment.  Take hydrocodone every 6 hours as needed for severe pain. It may make you sleepy so use caution.  Be well, Dr. [name]   Knee Injection Joint injections are shots. Your caregiver will place a needle into your knee joint. The needle is used to put medicine into the joint. These shots can be used to help treat different painful knee conditions such as osteoarthritis, bursitis, local flare-ups of rheumatoid arthritis, and pseudogout. Anti-inflammatory medicines such as corticosteroids and anesthetics are the most common medicines used for joint and soft tissue injections.  PROCEDURE  The skin over the kneecap will be cleaned with an antiseptic solution.  Your caregiver will inject a small amount of a local anesthetic (a medicine like Novocaine) just under the skin in the area that was cleaned.  After the area becomes numb, a second injection is done. This second injection usually includes an anesthetic and an anti-inflammatory medicine called a steroid or cortisone. The needle is carefully placed in between the kneecap and the knee, and the medicine is injected into the joint space.  After the injection is done, the needle is removed. Your caregiver may place a bandage over the injection site. The whole procedure takes no more than a couple of minutes. BEFORE THE PROCEDURE  Wash all of the skin around the entire knee area. Try to remove any loose, scaling skin. There is no other specific preparation necessary unless advised otherwise by your caregiver. LET YOUR CAREGIVER KNOW ABOUT:   Allergies.  Medications taken including herbs, eye drops, over the counter medications, and creams.  Use of steroids (by mouth or creams).  Possible pregnancy, if applicable.  Previous problems with  anesthetics or Novocaine.  History of blood clots (thrombophlebitis).  History of bleeding or blood problems.  Previous surgery.  Other health problems. RISKS AND COMPLICATIONS Side effects from cortisone shots are rare. They include:   Slight bruising of the skin.  Shrinkage of the normal fatty tissue under the skin where the shot was given.  Increase in pain after the shot.  Infection.  Weakening of tendons or tendon rupture.  Allergic reaction to the medicine.  Diabetics may have a temporary increase in their blood sugar after a shot.  Cortisone can temporarily weaken the immune system. While receiving these shots, you should not get certain vaccines. Also, avoid contact with anyone who has chickenpox or measles. Especially if you have never had these diseases or have not been previously immunized. Your immune system may not be strong enough to fight off the infection while the cortisone is in your system. AFTER THE PROCEDURE   You can go home after the procedure.  You may need to put ice on the joint 15-20 minutes every 3 or 4 hours until the pain goes away.  You may need to put an elastic bandage on the joint. HOME CARE INSTRUCTIONS   Only take over-the-counter or prescription medicines for pain, discomfort, or fever as directed by your caregiver.  You should avoid stressing the joint. Unless advised otherwise, avoid activities that put a lot of pressure on a knee joint, such as:  Jogging.  Bicycling.  Recreational climbing.  Hiking.  Laying down and elevating the leg/knee above the level of your heart can help to minimize swelling. [place]  IF:   You have repeated or worsening swelling.  There is drainage from the puncture area.  You develop red streaking that extends above or below the site where the needle was inserted. SEEK IMMEDIATE MEDICAL CARE IF:   You develop a fever.  You have pain that gets worse even though you are taking pain  medicine.  The area is red and warm, and you have trouble moving the joint. MAKE SURE YOU:   Understand these instructions.  Will watch your condition.  Will get help right away if you are not doing well or get worse. Document Released: 11/01/2006 Document Revised: 11/02/2011 Document Reviewed: 07/29/2007 [facility] Patient Information 2015 [place], [place]. This information is not intended to replace advice given to you by your health care provider. Make sure you discuss any questions you have with your health care provider.

## 2014-02-15 NOTE — Progress Notes (Signed)
Patient ID: Barbara Reese, female   DOB: 1966-02-06, 48 y.o.   MRN: [id number]  HPI:  Knee pain: Patient presents to followup on right knee pain. She was seen by Dr. Maricela Bo on March 5 and had a steroid injection. This gave her significant relief for about 3 months, but it recently flared up. Her pain is primarily located on the medial aspect of her knee. She's not had any fevers. She's been missing work about twice a month for this pain, and requests FMLA forms be completed today. 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 She's been followed by [facility] previously for her left knee, and had an arthroscopy of her left knee several years ago. She wants to avoid surgery as much as possible, as she had poor experience recovering from last surgery. She's been doing in size, and ice and heat, elevating her knee. She is also trying to lose weight. She did not tolerate meloxicam secondary to gastritis.  Hypertension: Currently taking HCTZ 25 mg daily. Denies any chest pain, shortness of breath, syncope, or edema. She tolerates this medicine well.  Allergic rhinitis: Needs a refill of her Flonase. It helps her allergies.  ROS: See HPI  Saks: History of obesity, depression/anxiety, acid reflux, lichen planus, hypertension, arthritis  PHYSICAL EXAM: BP 132/82  Pulse 80  Temp(Src) 98.1 F (36.7 C) (Oral)  Wt 242 lb (109.77 kg) Gen: No acute distress, pleasant, cooperative HEENT: Normocephalic, atraumatic Heart: Regular rate and rhythm, no murmurs Lungs: Clear to auscultation bilaterally, normal respiratory effort Neuro: Grossly nonfocal, speech intact Extremities: No appreciable lower extremity edema bilaterally. Right knee has crepitus with flexion and extension. Patella is minimally mobile. The landmarks are very difficult to determine. She is joint line tenderness medially on the  right knee. Negative Lachman's. Left knee has less crepitus in the right knee. No effusion in either knee. No joint line tenderness of the left knee. Negative Lachman's of the left knee.  PROCEDURE NOTE:  After informed written consent was obtained, patient was seated on exam table. Right knee was prepped with alcohol swab. Utilizing anterolateral approach, the needle was introduced into the knee. The needle encountered bone, and was unable to enter the joint space. The needle was withdrawn and replaced with a new needle on the syringe. An anteriomedial approach was then attempted with a new needle, and similarly was unable to enter the joint space. No medication was injected into the knee. The needle was withdrawn. Patient tolerated the attempted procedure well without immediate complications. Procedure was supervised by Dr. Erin Hearing.  ASSESSMENT/PLAN:  See problem based charting for additional assessment/plan.  FOLLOW UP: F/u in 3 months for HTN. Referring to sports medicine for knee pain.  [place]. [name], [place]

## 2014-02-15 NOTE — Assessment & Plan Note (Signed)
Well controlled, continue current management. Followup in 3 months.

## 2014-02-19 ENCOUNTER — Telehealth: Payer: Self-pay | Admitting: Family Medicine

## 2014-02-19 NOTE — Telephone Encounter (Signed)
Pt called and will be traveling to the [place] and would like some BC that will stop her period while she is there. jw

## 2014-02-22 NOTE — Telephone Encounter (Signed)
I will call pt next week to speak about this. I need to get more history from her prior to prescribing any medication.  Also, I filled out her FMLA paperwork and put it on [place] RN's desk for it to be scanned into chart and then pt notified that it is ready.  [name], MD

## 2014-02-26 NOTE — Telephone Encounter (Signed)
Pt informed that FMLA paperwork is completed and ready for pick up.  Paper work copied for scanning in pt's record.  Derl Barrow, RN

## 2014-02-27 ENCOUNTER — Telehealth: Payer: Self-pay | Admitting: Family Medicine

## 2014-02-27 NOTE — Telephone Encounter (Signed)
Returned patient's call. She states that she is going out of the country on August 22 and is due to be on her period then. She has heavy periods with clots, and really doesn't want to be on her period while flying. She gets monthly periods and is sexually active, uses condoms every time. She was on OCP's 26 years ago but had to stop them when she got hepatitis (she does not know the details of this illness). Denies any hx of blood clots, heart problems, or smoking. 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   Advised pt that we have to weigh the risks and benefits of estrogen therapy in this situation, and that I am concerned about possible stroke risk given her hx of hepatic disease causing her to have to stop OCP's, and hx of migraine with aura (although distant). Will plan to discuss with preceptor and get back in touch with pt later this week. Pt will at minimum need a pregnancy test before we can do anything for birth control.  [name], MD

## 2014-02-27 NOTE — Telephone Encounter (Signed)
Pt has called again and really needs Dr. [name] to call her because she needs to be on something before going out of the [place]. Please call her at work today. jw

## 2014-02-27 NOTE — Telephone Encounter (Signed)
See separate phone note about period and travel. [name], MD

## 2014-03-05 ENCOUNTER — Ambulatory Visit (INDEPENDENT_AMBULATORY_CARE_PROVIDER_SITE_OTHER): Payer: Medicare HMO | Admitting: Family Medicine

## 2014-03-05 ENCOUNTER — Encounter: Payer: Self-pay | Admitting: Family Medicine

## 2014-03-05 VITALS — BP 137/88 | HR 71 | Ht 66.0 in | Wt 242.0 lb

## 2014-03-05 DIAGNOSIS — M1710 Unilateral primary osteoarthritis, unspecified knee: Secondary | ICD-10-CM

## 2014-03-05 DIAGNOSIS — M1711 Unilateral primary osteoarthritis, right knee: Secondary | ICD-10-CM

## 2014-03-05 MED ORDER — METHYLPREDNISOLONE ACETATE 40 MG/ML IJ SUSP
40.0000 mg | Freq: Once | INTRAMUSCULAR | Status: AC
  Administered 2014-03-05: 40 mg via INTRA_ARTICULAR

## 2014-03-05 NOTE — Progress Notes (Signed)
Patient ID: Barbara Reese, female   DOB: 10/12/1965, 48 y.o.   MRN: [id number]  Barbara Reese - 48 y.o. female MRN [id number]  Date of birth: 10-07-1965    SUBJECTIVE:     Right knee pain. Seen by PCP really tried to do an ejection. Had difficulty locating the joint space. Her pain is been chronic for the last couple of years, worse with squatting stooping or stairs. 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 She occasionally has some swelling but no erythema. ROS:     No fever, sweats, chills, unusual weight change.  PERTINENT  PMH / PSH FH / / SH:  Past Medical, Surgical, Social, and Family History Reviewed & Updated in the EMR.  Pertinent findings include:  Obesity  no personal history of diabetes mellitus No history of knee surgery on the right.  OBJECTIVE: BP 137/88  Pulse 71  Ht 5\' 6"  (1.676 m)  Wt 242 lb (109.77 kg)  BMI 39.08 kg/m2  Physical Exam:  Vital signs are reviewed. GENERAL: Well-developed overweight female no acute distress KNEE: Right. Medial joint line tenderness. Anterior and lateral knee are nontender to palpation. There is no effusion. Ligaments intact to varus and valgus stress. Popliteal space is benign. The calf is soft. Distally she is neurovascularly intact. ULTRASOUND: Significant spurring at the medial joint line.  INJECTION: Patient was given informed consent, signed copy in the chart. Appropriate time out was taken. Area prepped and draped in usual sterile fashion. One cc of methylprednisolone 40 mg/ml plus  4 cc of 1% lidocaine without epinephrine was injected into the right knee using a(n) anterior medial approach. The patient tolerated the procedure well. There were no complications. Post procedure instructions were given.   ASSESSMENT & PLAN:  See problem based charting & AVS for pt instructions.

## 2014-03-05 NOTE — Assessment & Plan Note (Addendum)
Corticosteroid injection today. We talked about serial injections no more frequently then every 3 months. We talked about weight loss. She was inquiring about a brace and I discussed that. I don't think a brace with  really help her. She has taken Tylenol without much help so she will try over-the-counter Aleve and I urged her to try combination of Tylenol and Aleve. We discussed red flecks concerned with use of NSAIDs chronically such as abdominal pain, heartburn, dark or tarry stools. We also talked about icing. Followup when necessary

## 2014-03-08 NOTE — Telephone Encounter (Signed)
Returned call to pt again to discuss pt's request for birth control. I discussed this with Dr. Mingo Amber, who recommends against prescribing any hormonal birth control given her complex history. Pt also brought up Mirena as a possibility, and I advised that this would be possible, but would likely not control her period by August 22 since that is about one month away. Advised separate office visit to discuss Mirena in detail if she would like to do this long term. Pt appreciative of the call.  [name], MD

## 2014-03-26 ENCOUNTER — Telehealth: Payer: Self-pay | Admitting: Family Medicine

## 2014-03-26 ENCOUNTER — Other Ambulatory Visit: Payer: Self-pay | Admitting: Family Medicine

## 2014-03-26 MED ORDER — LORAZEPAM 0.5 MG PO TABS: ORAL_TABLET | ORAL | Status: DC

## 2014-03-26 NOTE — Telephone Encounter (Signed)
Rx done. Will fax.

## 2014-03-26 NOTE — Telephone Encounter (Signed)
Patient requesting refill on Lorazepam for a plane ride. Please send to pharmacy.

## 2014-03-29 ENCOUNTER — Telehealth: Payer: Self-pay | Admitting: *Deleted

## 2014-03-29 NOTE — Telephone Encounter (Signed)
Received 2nd request for Lorazepam.  Was the Rx faxed already to [place] pharmacy? Derl Barrow, RN

## 2014-03-30 NOTE — Telephone Encounter (Signed)
I believe I fax it. Could you call and clarify?  Thanks  [employer]

## 2014-04-27 ENCOUNTER — Other Ambulatory Visit: Payer: Self-pay | Admitting: *Deleted

## 2014-05-01 ENCOUNTER — Other Ambulatory Visit: Payer: Self-pay | Admitting: *Deleted

## 2014-05-03 ENCOUNTER — Other Ambulatory Visit: Payer: Self-pay | Admitting: *Deleted

## 2014-05-03 MED ORDER — PANTOPRAZOLE SODIUM 40 MG PO TBEC: 40.0000 mg | DELAYED_RELEASE_TABLET | Freq: Every day | ORAL | Status: DC

## 2014-05-04 MED ORDER — PANTOPRAZOLE SODIUM 40 MG PO TBEC: 40.0000 mg | DELAYED_RELEASE_TABLET | Freq: Every day | ORAL | Status: DC

## 2014-05-11 ENCOUNTER — Ambulatory Visit: Payer: Medicare HMO | Admitting: Family Medicine

## 2014-05-17 ENCOUNTER — Encounter: Payer: Self-pay | Admitting: Sports Medicine

## 2014-05-17 ENCOUNTER — Ambulatory Visit (INDEPENDENT_AMBULATORY_CARE_PROVIDER_SITE_OTHER): Payer: Medicare HMO | Admitting: Sports Medicine

## 2014-05-17 VITALS — BP 132/88 | HR 70 | Ht 66.0 in | Wt 242.0 lb

## 2014-05-17 DIAGNOSIS — M23239 Derangement of other medial meniscus due to old tear or injury, unspecified knee: Secondary | ICD-10-CM | POA: Insufficient documentation

## 2014-05-17 DIAGNOSIS — M25569 Pain in unspecified knee: Secondary | ICD-10-CM

## 2014-05-17 DIAGNOSIS — M1710 Unilateral primary osteoarthritis, unspecified knee: Secondary | ICD-10-CM

## 2014-05-17 DIAGNOSIS — M23305 Other meniscus derangements, unspecified medial meniscus, unspecified knee: Secondary | ICD-10-CM

## 2014-05-17 DIAGNOSIS — M25561 Pain in right knee: Secondary | ICD-10-CM

## 2014-05-17 DIAGNOSIS — M1711 Unilateral primary osteoarthritis, right knee: Secondary | ICD-10-CM

## 2014-05-17 MED ORDER — DICLOFENAC SODIUM 75 MG PO TBEC: 75.0000 mg | DELAYED_RELEASE_TABLET | Freq: Two times a day (BID) | ORAL | Status: DC

## 2014-05-17 MED ORDER — METHYLPREDNISOLONE ACETATE 40 MG/ML IJ SUSP
40.0000 mg | Freq: Once | INTRAMUSCULAR | Status: AC
  Administered 2014-05-17: 40 mg via INTRA_ARTICULAR

## 2014-05-17 MED ORDER — DICLOFENAC SODIUM 1 % TD GEL: 2.0000 g | Freq: Four times a day (QID) | TRANSDERMAL | Status: DC

## 2014-05-17 NOTE — Progress Notes (Signed)
  HADLEI STITT - 48 y.o. female MRN [id number]  Date of birth: 1965/09/26  SUBJECTIVE:  Including CC & ROS.  Patient is a 48 year old obese African American female who presents for followup on her right knee after poor response to intra-articular steroid injections and persistent pain for last 6 months. Patient reports a history of onset of right knee pain in March of 2015 with no known mechanism of action that she can recall. She localizes her pain to the medial jointline. In March patient was suspected to have pain secondary to degenerative joint disease seen on previous x-rays and was treated with an intra-articular steroid injection at that time as well as Mobic. Patient reports clinical response to the injection with it lasting about 3 months again follow up in June for reevaluation PCPs office was unable to complete her intra-articular injection. Patient reports that she was then seen in July by Dr. Nori Riis in sports medicine who provided her with a intra-articular injection. Patient reports no clinical response from this injection in July. Continues to complain of chronic pain worse was caught squatting, stooping and stairs. 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 She also reports occasional swelling but no warmth or erythema. Patient would like to continue to avoid surgery because of her long and difficult recovery from left knee arthroscopic surgery done several years ago   ROS: Review of systems otherwise negative except for information present in HPI  HISTORY: Past Medical, Surgical, Social, and Family History Reviewed & Updated per EMR. Pertinent Historical Findings include: Left knee arthroscopic surgery and debridement Hypertension, depression, obesity, acid reflux  DATA REVIEWED: Personally reviewed patient's most recent x-rays from March 2015 that showed tricompartment degeneration in the right knee with mild to moderate jointline narrowing.  PHYSICAL EXAM:  VS:  BP:132/88 mmHg  HR:70bpm  TEMP: ( )  RESP:   HT:5\' 6"  (167.6 cm)   WT:242 lb (109.77 kg)  BMI:39.1 KNEE EXAM:  General: well nourished Skin of LE: warm; dry, no rashes, lesions, ecchymosis or erythema. Vascular: Dorsal pedal pulses 2+ bilaterally Neurologically: Sensation to light touch lower extremities equal and intact bilaterally.  Observation: no knee effusion visualized, no previous surgical scars, patella in place, chronic quad muscle atrophy Palpation:  No tenderness over the tibial tuberosity or patellar tendons, or quadriceps tendon.   Significant pain along the medial joint line No pain along the lateral joint line Range of motion: Full ROM at the hip. Full knee flexion to approximately 140, full extension 0, normal patellar tracking Ligamentous testing: ligamentous stability intact Negative patella apprehension test Meniscal evaluation: positive McMurray's test for pain, positive for thessaly's test, Normal gait  [facility] [place]:  knee ultrasound revealed normal patella tendon, normal quadriceps tendon, no suprapatella effusion. Degenerative changes to the medial meniscus with fluid collection. Lateral meniscus intact and normal  ASSESSMENT & PLAN: See problem based charting & AVS for pt instructions.

## 2014-05-17 NOTE — Assessment & Plan Note (Signed)
Patient does have underlying arthritis to any however I do not feel this is the primary source of her pain. Patient is not responding to intra-articular injections and just had one 2 months ago which is outside the recommended duration of the least every 3 months between injections.  Other recommendations include: -Continuing when necessary anti-inflammatories for pain control -Knee sleeve for compression and embolization -Weight loss -Consideration for hyaluronic acid injections once meniscus tear ruled out with MRI

## 2014-05-17 NOTE — Assessment & Plan Note (Addendum)
Based on the patient's exam today with medial joint line tenderness, positive McMurray's, and poor spots response to cortisone injections I suspect there is underlying medial meniscus tear propagating patient's symptoms.  Recommendations: -Changing anti-inflammatory to diclofenac 75 mg twice a day -Provided patient with topical Voltaren gel as well for when necessary pain control -Recommended proceeding with MRI to evaluate possible meniscus tear -Based on the MRI results patient can be considered for a cortisone injection, consideration for referral to orthopedic surgery, versus hyaluronic acid injection depending on the condition of her meniscus.

## 2014-05-17 NOTE — Patient Instructions (Signed)
It was great to see you today! Call the office if you have any questions [phone]   1. Please get MRI 2. Follow up next week 3. Wear knee sleeve throughout the day

## 2014-05-22 ENCOUNTER — Ambulatory Visit
Admission: RE | Admit: 2014-05-22 | Discharge: 2014-05-22 | Disposition: A | Discharge status: Home / Self Care | Payer: 59 | Source: Ambulatory Visit | Attending: Sports Medicine | Admitting: Sports Medicine

## 2014-05-22 ENCOUNTER — Telehealth: Payer: Self-pay | Admitting: Sports Medicine

## 2014-05-22 DIAGNOSIS — M25561 Pain in right knee: Secondary | ICD-10-CM

## 2014-05-22 NOTE — Telephone Encounter (Signed)
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  Discuss with patient treatment options including surgical evaluation, continue anti-inflammatories, or contacting PCP for pain management options. Patient like to consider her options and will contact us if she would like a referral to surgery otherwise she will contact her PCP about other management. Patient had failed responses to steroid injections in the past and has had little to no relief from anti-inflammatories

## 2014-05-23 ENCOUNTER — Ambulatory Visit (INDEPENDENT_AMBULATORY_CARE_PROVIDER_SITE_OTHER): Payer: 59 | Admitting: Sports Medicine

## 2014-05-23 ENCOUNTER — Encounter: Payer: Self-pay | Admitting: Sports Medicine

## 2014-05-23 VITALS — BP 126/84 | HR 78 | Ht 66.0 in | Wt 242.0 lb

## 2014-05-23 DIAGNOSIS — M109 Gout, unspecified: Secondary | ICD-10-CM | POA: Insufficient documentation

## 2014-05-23 MED ORDER — PREDNISONE 20 MG PO TABS: 20.0000 mg | ORAL_TABLET | Freq: Two times a day (BID) | ORAL | Status: DC

## 2014-05-23 MED ORDER — TRAMADOL HCL 50 MG PO TABS: 50.0000 mg | ORAL_TABLET | Freq: Four times a day (QID) | ORAL | Status: DC | PRN

## 2014-05-23 MED ORDER — METHYLPREDNISOLONE ACETATE 80 MG/ML IJ SUSP
80.0000 mg | Freq: Once | INTRAMUSCULAR | Status: AC
  Administered 2014-05-23: 80 mg via INTRAMUSCULAR

## 2014-05-23 NOTE — Progress Notes (Signed)
Patient ID: Barbara Reese, female   DOB: 11/25/1965, 48 y.o.   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

## 2014-05-23 NOTE — Patient Instructions (Signed)
Stop your HCTZ until you see Dr [name] at family medicine.  Use prednisone 20 mgm twice daily for next 5 to 7 days.  Hopefully the injection will settle down the gout in 48 hours.  At family medicine they need to repeat labs to include a uric acid and to check your kidney function.  Use tramadol for pain/ stop if you get nausea.  We will rescan your toe in the next 2 weeks with Dr Ledora Bottcher

## 2014-05-23 NOTE — Assessment & Plan Note (Signed)
IM solumedrol 80 Prednisone 20 bid x 5 to 7 days Post op shoe Crutch  Use topical voltaren  I would first stop HCTZ She should go back to [facility] for labs and evaluation of metabolic syndrome status - borderline glucose in past  If sxs persiste with her GERD she would probably be better candidate for colchicine  We will see back and repeat US in ~ 2 weeks

## 2014-05-28 ENCOUNTER — Telehealth: Payer: Self-pay | Admitting: Family Medicine

## 2014-05-28 NOTE — Telephone Encounter (Signed)
Saw dr fields for gout last wed. He took her off her blood pressure medication. He advised her to followup with Dr [name] Dr [name] doesn't have any appts for this month Pt needs to know what to do about her blood pressure medication'

## 2014-05-29 NOTE — Telephone Encounter (Signed)
Patient very concerned about BP, wants to know if she is ok to be off med or if MD wants her to be seen. Message to PCP

## 2014-05-29 NOTE — Telephone Encounter (Signed)
Nurse visit for bp check scheduled for 10/7.

## 2014-05-29 NOTE — Telephone Encounter (Signed)
Pt should come in for BP check off her medicine. This can be a nurse visit. If BP is still elevated, I will call in a new medicine for her. Please inform.  [name], MD

## 2014-05-30 ENCOUNTER — Ambulatory Visit (INDEPENDENT_AMBULATORY_CARE_PROVIDER_SITE_OTHER): Payer: Medicare HMO | Admitting: *Deleted

## 2014-05-30 VITALS — BP 152/88 | HR 62

## 2014-05-30 DIAGNOSIS — Z136 Encounter for screening for cardiovascular disorders: Secondary | ICD-10-CM

## 2014-05-30 DIAGNOSIS — M109 Gout, unspecified: Secondary | ICD-10-CM

## 2014-05-30 DIAGNOSIS — M1000 Idiopathic gout, unspecified site: Secondary | ICD-10-CM

## 2014-05-30 DIAGNOSIS — Z0130 Encounter for examination of blood pressure without abnormal findings: Secondary | ICD-10-CM

## 2014-05-30 NOTE — Progress Notes (Signed)
   Pt in nurse clinic for blood pressure check.  BP 152/88 manually, heart rate 62.  Pt stated she had a headache yesterday. She has been off her medications x 1 week now.  Pt denies any chest pain, SOB, dizziness or visual changes.  Pt also stated she needed lab work for gout.  Per verbal order by Dr. [name]; order BMP and uric acid.  Will forward to PCP.  Derl Barrow, RN

## 2014-05-31 LAB — URIC ACID: URIC ACID, SERUM: 4.7 mg/dL (ref 2.4–7.0)

## 2014-05-31 LAB — BASIC METABOLIC PANEL
BUN: 8 mg/dL (ref 6–23)
CHLORIDE: 102 meq/L (ref 96–112)
CO2: 26 meq/L (ref 19–32)
Calcium: 9.2 mg/dL (ref 8.4–10.5)
Creat: 0.92 mg/dL (ref 0.50–1.10)
Glucose, Bld: 132 mg/dL — ABNORMAL HIGH (ref 70–99)
POTASSIUM: 4 meq/L (ref 3.5–5.3)
SODIUM: 137 meq/L (ref 135–145)

## 2014-06-01 ENCOUNTER — Telehealth: Payer: Self-pay | Admitting: Family Medicine

## 2014-06-01 DIAGNOSIS — I10 Essential (primary) hypertension: Secondary | ICD-10-CM

## 2014-06-01 MED ORDER — LISINOPRIL 10 MG PO TABS: 10.0000 mg | ORAL_TABLET | Freq: Every day | ORAL | Status: DC

## 2014-06-01 MED ORDER — AMLODIPINE BESYLATE 5 MG PO TABS: 5.0000 mg | ORAL_TABLET | Freq: Every day | ORAL | Status: DC

## 2014-06-01 NOTE — Telephone Encounter (Signed)
Patient states she was tried on this medication last year and and it was discontinued due to her having problems with it. Patient states if her labs were normal is it ok for her to just to go back to taking the HCTZ instead. Will forward to PCP.

## 2014-06-01 NOTE — Telephone Encounter (Signed)
I will call in amlodipine 5mg  daily for her. She will need to return in 2-3 weeks for BP check with Tamika to see if he BP is better. Please inform patient.  [name], MD

## 2014-06-01 NOTE — Progress Notes (Signed)
See phone note for details of BP management. [name], MD

## 2014-06-01 NOTE — Telephone Encounter (Signed)
Please call patient to advise whether she need to restart taking her bp med or not.

## 2014-06-01 NOTE — Telephone Encounter (Signed)
Okay. Can switch to lisinopril 10mg  daily and see how she does with this medicine. We should not restart HCTZ due to her having gout. She'll need to come back in 1-2 weeks for BP check and lab draw to be sure kidneys are tolerating lisinopril. Will enter orders. Please also let pt know that it is important that she not get pregnant while taking this medicine.  Please inform patient.  Thanks! [name], MD

## 2014-06-01 NOTE — Telephone Encounter (Signed)
Patient informed, expressed understanding. 

## 2014-06-05 ENCOUNTER — Telehealth: Payer: Self-pay | Admitting: Family Medicine

## 2014-06-05 NOTE — Telephone Encounter (Signed)
Okay, sounds good. Thanks for the FYI. Please just make sure pt knows not to take lisinopril then. Thanks, [name], MD

## 2014-06-05 NOTE — Telephone Encounter (Signed)
Pt has decided to continue to take HCTZ despite her gout flare up. She states she spoke to her pharmacist last night and she will continue to take HCTZ and if she has another gout flare she will address changing her HTN meds then. Pls be advise.

## 2014-06-05 NOTE — Telephone Encounter (Signed)
FYI to PCP

## 2014-06-07 ENCOUNTER — Ambulatory Visit: Payer: 59 | Admitting: Sports Medicine

## 2014-06-12 ENCOUNTER — Other Ambulatory Visit: Payer: Self-pay | Admitting: Family Medicine

## 2014-06-12 DIAGNOSIS — Z1231 Encounter for screening mammogram for malignant neoplasm of breast: Secondary | ICD-10-CM

## 2014-06-14 ENCOUNTER — Other Ambulatory Visit (INDEPENDENT_AMBULATORY_CARE_PROVIDER_SITE_OTHER): Payer: Medicare HMO

## 2014-06-14 ENCOUNTER — Ambulatory Visit: Payer: 59 | Admitting: Family Medicine

## 2014-06-14 ENCOUNTER — Encounter: Payer: Self-pay | Admitting: Family Medicine

## 2014-06-14 ENCOUNTER — Ambulatory Visit (INDEPENDENT_AMBULATORY_CARE_PROVIDER_SITE_OTHER): Payer: Medicare HMO | Admitting: Family Medicine

## 2014-06-14 VITALS — BP 136/78 | HR 78 | Ht 66.0 in | Wt 240.0 lb

## 2014-06-14 DIAGNOSIS — M25561 Pain in right knee: Secondary | ICD-10-CM

## 2014-06-14 DIAGNOSIS — M23239 Derangement of other medial meniscus due to old tear or injury, unspecified knee: Secondary | ICD-10-CM

## 2014-06-14 NOTE — Assessment & Plan Note (Signed)
Patient does have a displaced tear and has had and likely for approximately 3 months. When comparing to the MRI ultrasound does show some mild improvement. Patient would like to avoid surgical intervention if possible. Patient has done formal physical therapy with minimal benefit. Patient was given another corticosteroid injection today. We discussed with patient about home exercises the patient was given more of a stability brace. We also discussed icing protocol. Patient will try these conservative therapies and come back in 3 weeks for further evaluation and treatment.

## 2014-06-14 NOTE — Progress Notes (Signed)
[facility] [place], [place] [address] Phone: [phone] Subjective:     CC: Right knee pain  [id number] Barbara Reese is a 48 y.o. female coming in with complaint of right knee pain. Patient is a very pleasant 48 year old female who is coming in with right knee pain. Patient has had about 3 month's worth of pain. Patient states that before this though she has had pain intermittently for approximately last 2 years but now is giving her significant difficulty when she is doing any squatting stooping or trying to climb stairs. 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 Patient did see another provider for this in 3 months ago was given a corticosteroid injection. Patient did have some mild improvement for 2-4 weeks. Partially the pain continued to give her difficulty. Patient did have concern for having a meniscal injury an MRI was ordered. MRI shows the patient does have a fairly severe complex tear of the posterior horn and body of the medial meniscus with peripheral meniscal extrusion. Patient also has full thickness cartilage loss of the lateral patellar facet and partial-thickness loss of the lateral trochlea. Patient states that she does have a past medical history significant for a arthroscopic procedure on her left knee and did not have a good outcome and would like to avoid any surgical intervention. Patient is wanting to know what else can be done.  Patient states that this is affecting her daily activities and sometimes can wake her up at night. Patient has been taking over-the-counter medications with mild benefit. Patient has been wearing a compression sleeve with mild benefit as well. Denies any radiation of the legs or any numbness or weakness. States that the pain can wake her up at night from time to time.     Past medical history, social, surgical and family history all reviewed in electronic  medical record.   Review of Systems: No headache, visual changes, nausea, vomiting, diarrhea, constipation, dizziness, abdominal pain, skin rash, fevers, chills, night sweats, weight loss, swollen lymph nodes, body aches, joint swelling, muscle aches, chest pain, shortness of breath, mood changes.   Objective Blood pressure 136/78, pulse 78, height 5\' 6"  (1.676 m), weight 240 lb (108.863 kg), SpO2 98.00%.  General: No apparent distress alert and oriented x3 mood and affect normal, dressed appropriately. obese HEENT: Pupils equal, extraocular movements intact  Respiratory: Patient's speak in full sentences and does not appear short of breath  Cardiovascular: No lower extremity edema, non tender, no erythema  Skin: Warm dry intact with no signs of infection or rash on extremities or on axial skeleton.  Abdomen: Soft nontender  Neuro: Cranial nerves II through XII are intact, neurovascularly intact in all extremities with 2+ DTRs and 2+ pulses.  Lymph: No lymphadenopathy of posterior or anterior cervical chain or axillae bilaterally.  Gait normal with good balance and coordination.  MSK:  Non tender with full range of motion and good stability and symmetric strength and tone of shoulders, elbows, wrist, hip, and ankles bilaterally.  Knee: Right Normal to inspection with no erythema or effusion or obvious bony abnormalities. Palpation reveals patient does have medial joint line tenderness ROM full in flexion and extension and lower leg rotation. Ligaments with solid consistent endpoints including ACL, PCL, LCL, MCL. Positive Mcmurray's, Apley's, and Thessalonian tests. Non painful patellar compression. Patellar glide without crepitus. Patellar and quadriceps tendons unremarkable. Hamstring and quadriceps strength is normal.  MSK US performed of: Right knee This study was ordered, performed, and interpreted by Charlann Boxer D.O.  Knee: All structures visualized. Posterior medial meniscus  does have what appears to be a tear with moderate displacement. This displacement though is less than focusing on the MRI. I would say approximately 45% of meniscus is extruding the joint space. There is some increasing Doppler flow as well as some scar tissue formation noted. Moderate narrowing of the medial joint space. Patient also has narrowing of the patellofemoral joint. Anteromedial, anterolateral, and posterolateral menisci unremarkable without tearing, fraying, effusion, or displacement. Patellar Tendon unremarkable on long and transverse views without effusion. No abnormality of prepatellar bursa. LCL and MCL unremarkable on long and transverse views. No abnormality of origin of medial or lateral head of the gastrocnemius.  IMPRESSION:  Moderate osteophytic changes with what appears to be improving displaced medial meniscal tear when comparing to MRI.   Procedure: Real-time Ultrasound Guided Injection of right knee Device: GE Logiq E  Ultrasound guided injection is preferred based studies that show increased duration, increased effect, greater accuracy, decreased procedural pain, increased response rate, and decreased cost with ultrasound guided versus blind injection.  Verbal informed consent obtained.  Time-out conducted.  Noted no overlying erythema, induration, or other signs of local infection.  Skin prepped in a sterile fashion.  Local anesthesia: Topical Ethyl chloride.  With sterile technique and under real time ultrasound guidance: With a 22-gauge 2 inch needle patient was injected with 4 cc of 0.5% Marcaine and 1 cc of Kenalog 40 mg/dL. This was from a superior lateral approach.  Completed without difficulty  Pain immediately resolved suggesting accurate placement of the medication.  Advised to call if fevers/chills, erythema, induration, drainage, or persistent bleeding.  Images permanently stored and available for review in the ultrasound unit.  Impression: Technically  successful ultrasound guided injection.       Impression and Recommendations:     This case required medical decision making of moderate complexity.

## 2014-06-14 NOTE — Patient Instructions (Signed)
Great to see you Ice 20 minutes 2 times daily. Usually after activity and before bed. Exercises 3 times a week.  Wear brace with a lot of activity.  Vitamin D 2000 IU daily Turmeric 500mg  twice daily.  I have the other injections if needed.  See me again in 3 weeks.

## 2014-06-15 ENCOUNTER — Ambulatory Visit: Payer: Medicare HMO | Admitting: Family Medicine

## 2014-07-05 ENCOUNTER — Ambulatory Visit: Payer: Medicare HMO | Admitting: Family Medicine

## 2014-07-06 ENCOUNTER — Ambulatory Visit (HOSPITAL_COMMUNITY): Payer: Medicare HMO

## 2014-08-14 ENCOUNTER — Ambulatory Visit (HOSPITAL_COMMUNITY)
Admission: RE | Admit: 2014-08-14 | Discharge: 2014-08-14 | Disposition: A | Discharge status: Home / Self Care | Payer: Medicare HMO | Source: Ambulatory Visit | Attending: Internal Medicine | Admitting: Internal Medicine

## 2014-08-14 DIAGNOSIS — Z1231 Encounter for screening mammogram for malignant neoplasm of breast: Secondary | ICD-10-CM

## 2014-08-30 ENCOUNTER — Telehealth: Payer: Self-pay | Admitting: Family Medicine

## 2014-08-30 NOTE — Telephone Encounter (Signed)
Pt dropped off FMLA forms to be completed

## 2014-08-31 NOTE — Telephone Encounter (Signed)
Placed in MDs box to be filled out. Barbara Reese, CMA  

## 2014-09-05 NOTE — Telephone Encounter (Signed)
I received these FMLA forms, however it has been over 6 months since I saw patient in clinic. She will need to schedule an appointment so we can talk about how things are going before I can fill them out. Please ask pt to schedule an appointment at her convenience.  Thanks, [name], MD

## 2014-09-06 ENCOUNTER — Telehealth: Payer: Self-pay | Admitting: Family Medicine

## 2014-09-06 NOTE — Telephone Encounter (Signed)
Pt called to check the status of her FMLA papers. She was told to make an appointment with Dr. [name]. She just wanted the paper filled out ahead of time. She also wanted to see if the doctor could see her earlier than 2/12. ?

## 2014-09-07 ENCOUNTER — Ambulatory Visit: Payer: Medicare HMO | Admitting: Family Medicine

## 2014-09-11 ENCOUNTER — Ambulatory Visit (INDEPENDENT_AMBULATORY_CARE_PROVIDER_SITE_OTHER): Payer: Medicare HMO | Admitting: Family Medicine

## 2014-09-11 ENCOUNTER — Other Ambulatory Visit (INDEPENDENT_AMBULATORY_CARE_PROVIDER_SITE_OTHER): Payer: Medicare HMO

## 2014-09-11 ENCOUNTER — Encounter: Payer: Self-pay | Admitting: Family Medicine

## 2014-09-11 VITALS — BP 138/82 | HR 80 | Ht 66.0 in | Wt 246.0 lb

## 2014-09-11 DIAGNOSIS — M23239 Derangement of other medial meniscus due to old tear or injury, unspecified knee: Secondary | ICD-10-CM

## 2014-09-11 DIAGNOSIS — M25562 Pain in left knee: Secondary | ICD-10-CM

## 2014-09-11 DIAGNOSIS — M170 Bilateral primary osteoarthritis of knee: Secondary | ICD-10-CM

## 2014-09-11 NOTE — Patient Instructions (Signed)
Good to see you Ice is your friend Continue the exercises  Try brace without sleeve We have other injections if needed If left knee locks again probably should  Get an injection.  If lock try to rotate foot outward.  Try new brace  See me again in 3-4 weeks.

## 2014-09-11 NOTE — Progress Notes (Signed)
[name] [place], [place] [address] Phone: [phone] Subjective:     CC: Right knee pain  [id number] Senovia KHALESSI BLOUGH is a 49 y.o. female coming in with complaint of right knee pain. Patient is a very pleasant 49 year old female who is coming in with right knee pain. Patient has had about 3 month's worth of pain. Patient states that before this though she has had pain intermittently for approximately last 2 years but now is giving her significant difficulty when she is doing any squatting stooping or trying to climb stairs. 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 Patient did see another provider for this in 3 months ago was given a corticosteroid injection. Patient did have some mild improvement for 2-4 weeks. Partially the pain continued to give her difficulty.   Patient did have concern for having a meniscal injury an MRI was ordered. MRI shows the patient does have a fairly severe complex tear of the posterior horn and body of the medial meniscus with peripheral meniscal extrusion. Patient also has full thickness cartilage loss of the lateral patellar facet and partial-thickness loss of the lateral trochlea.   Patient states that she was doing significant better and still is a proximal a 60% better than what she was doing previously. Patient has been wearing the brace fairly regularly but does have some mild discomfort from time to time as well as the brace seems to slip. Patient denies though any radiation down the leg or any numbness or tingling. Patient still able to do daily activities and still wants to avoid surgery  Patient is having some new pain on the left knee. Patient does have a past [place] history significant for osteophytic changes that are fairly severe of the left knee and has had surgery before with a lateral release. Patient states that unfortunately she has had 2 episodes within  knee has seemed to be locking. Patient rates the severity of pain when this occurs as 9 out of 10.     Past medical history, social, surgical and family history all reviewed in electronic medical record.   Review of Systems: No headache, visual changes, nausea, vomiting, diarrhea, constipation, dizziness, abdominal pain, skin rash, fevers, chills, night sweats, weight loss, swollen lymph nodes, body aches, joint swelling, muscle aches, chest pain, shortness of breath, mood changes.   Objective Height 5\' 6"  (1.676 m), weight 246 lb (111.585 kg), last menstrual period 08/04/2014.  General: No apparent distress alert and oriented x3 mood and affect normal, dressed appropriately. obese HEENT: Pupils equal, extraocular movements intact  Respiratory: Patient's speak in full sentences and does not appear short of breath  Cardiovascular: No lower extremity edema, non tender, no erythema  Skin: Warm dry intact with no signs of infection or rash on extremities or on axial skeleton.  Abdomen: Soft nontender  Neuro: Cranial nerves II through XII are intact, neurovascularly intact in all extremities with 2+ DTRs and 2+ pulses.  Lymph: No lymphadenopathy of posterior or anterior cervical chain or axillae bilaterally.  Gait normal with good balance and coordination.  MSK:  Non tender with full range of motion and good stability and symmetric strength and tone of shoulders, elbows, wrist, hip, and ankles bilaterally.  Knee: Right Normal to inspection with no erythema or effusion or obvious bony abnormalities. Palpation reveals patient does have medial joint line tenderness ROM full in flexion and extension and lower leg rotation. Ligaments  with solid consistent endpoints including ACL, PCL, LCL, MCL. Positive Mcmurray's, Apley's, and Thessalonian tests still present. Non painful patellar compression. Patellar glide without crepitus. Patellar and quadriceps tendons unremarkable. Hamstring and quadriceps  strength is normal.   Left knee exam shows severe medial joint line tenderness but full range of motion. Neurovascular intact distally with mild crepitus of the knee. Negative McMurray's.   MSK US performed of: Left knee  This study was ordered, performed, and interpreted by Charlann Boxer D.O.  Knee: All structures visualized. Severe bone-on-bone osteophytic changes of the medial compartment of the knee.  Patellar Tendon unremarkable on long and transverse views without effusion. No abnormality of prepatellar bursa. LCL and MCL unremarkable on long and transverse views. No abnormality of origin of medial or lateral head of the gastrocnemius.  IMPRESSION:  Severe arthritis of the medial compartment   Procedure: Real-time Ultrasound Guided Injection of right knee Device: GE Logiq E  Ultrasound guided injection is preferred based studies that show increased duration, increased effect, greater accuracy, decreased procedural pain, increased response rate, and decreased cost with ultrasound guided versus blind injection.  Verbal informed consent obtained.  Time-out conducted.  Noted no overlying erythema, induration, or other signs of local infection.  Skin prepped in a sterile fashion.  Local anesthesia: Topical Ethyl chloride.  With sterile technique and under real time ultrasound guidance: With a 22-gauge 2 inch needle patient was injected with 4 cc of 0.5% Marcaine and 1 cc of Kenalog 40 mg/dL. This was from a superior lateral approach.  Completed without difficulty  Pain immediately resolved suggesting accurate placement of the medication.  Advised to call if fevers/chills, erythema, induration, drainage, or persistent bleeding.  Images permanently stored and available for review in the ultrasound unit.  Impression: Technically successful ultrasound guided injection.       Impression and Recommendations:     This case required medical decision making of moderate  complexity.

## 2014-09-11 NOTE — Assessment & Plan Note (Signed)
Patient does have severe arthritis and I think this is contribute in. Patient's locking I think is unfortunately secondary to the arthritis as well. Patient does not have a meniscus that is seen on ultrasound today on the left knee saw do not think that this is a internal derangement. Patient encouraged to continue the same exercise routine she is doing with the contralateral knee and was given a new brace today to see if this will unload some of the pain. Patient try the topical anti-inflammatory on this side as well. Patient and will follow-up see me again in 3-4 weeks. Continued have pain we will try a steroid injection.

## 2014-09-11 NOTE — Telephone Encounter (Signed)
Line busy, no earlier appointments available as MD is not in clinic much over the next couple of weeks.

## 2014-09-11 NOTE — Assessment & Plan Note (Signed)
Patient was given another injection today. Patient is going to do icing protocol and continue with the brace. We did make changes in patient did work with the Product/process development scientist today. We discussed the importance of the home exercises as well as the weight loss. Patient will continue with the topical anti-inflammatory. Patient come back and see me again in 3-4 weeks. Patient would be a candidate for viscous supplementation.

## 2014-09-12 ENCOUNTER — Telehealth: Payer: Self-pay | Admitting: Family Medicine

## 2014-09-17 NOTE — Telephone Encounter (Signed)
As I said about FMLA paperwork in prior note, patient needs an appointment before I can fill this out. I can't certify that she still needs to be out of work for a condition I saw her for last in June. I will happily fill it out for her with an office visit.  [name], MD

## 2014-09-17 NOTE — Telephone Encounter (Signed)
Spoke with patient.  She is upset that she dropped off the from on 08/30/14 and is still waiting.  She states "This ran out on 08/25/14 and the benefits department keeps calling me about this, I am going to lose my FMLA if it is not sent back in soon".  Advised to pt that I would get in touch with Dr. [name] and see if we can work something out, pt does not want to come back in if possible.  Spoke with Elray Mcgregor, RN who spoke with Dr. [name], we can see her tomorrow afternoon @ 3:30pm.  Per MD, she needs to have an appt because she has not seen her in >6 months (July 2015).  She will need to see her to be sure to fill out the form completely and truthfully to the best of her ability. LMOVM of pt cellphone that appt was made to please callback, with any issues.  Of note, attempted to call work number provided but pt was already at lunch. [place], Salome Spotted

## 2014-09-17 NOTE — Telephone Encounter (Signed)
Pt called again to check the status of her FMLA papers. She has been waiting over a month and no one is giving her an update and she is in jeopardy of loosing her FMLA that has been in place for quite some time. Can someone call her and give her an update. She is at work and her number is [phone] the [facility] [employer] site.Marland Kitchen jw

## 2014-09-18 ENCOUNTER — Encounter: Payer: Self-pay | Admitting: Family Medicine

## 2014-09-18 ENCOUNTER — Ambulatory Visit (INDEPENDENT_AMBULATORY_CARE_PROVIDER_SITE_OTHER): Payer: Medicare HMO | Admitting: Family Medicine

## 2014-09-18 ENCOUNTER — Ambulatory Visit: Payer: Medicare HMO | Admitting: Family Medicine

## 2014-09-18 VITALS — BP 147/95 | HR 77 | Temp 98.3°F | Ht 66.0 in | Wt 249.0 lb

## 2014-09-18 DIAGNOSIS — K625 Hemorrhage of anus and rectum: Secondary | ICD-10-CM

## 2014-09-18 DIAGNOSIS — M170 Bilateral primary osteoarthritis of knee: Secondary | ICD-10-CM

## 2014-09-18 LAB — CBC
HEMATOCRIT: 37.5 % (ref 36.0–46.0)
Hemoglobin: 12.2 g/dL (ref 12.0–15.0)
MCH: 27.1 pg (ref 26.0–34.0)
MCHC: 32.5 g/dL (ref 30.0–36.0)
MCV: 83.1 fL (ref 78.0–100.0)
MPV: 9.5 fL (ref 8.6–12.4)
Platelets: 383 10*3/uL (ref 150–400)
RBC: 4.51 MIL/uL (ref 3.87–5.11)
RDW: 14.5 % (ref 11.5–15.5)
WBC: 8.2 10*3/uL (ref 4.0–10.5)

## 2014-09-18 NOTE — Assessment & Plan Note (Signed)
External hemorrhoid, but no signs of recent bleeding to explain pt's symptoms. Plan: -check CBC -refer to GI for colonoscopy

## 2014-09-18 NOTE — Patient Instructions (Signed)
Completed your FMLA paperwork today. I am referring you to GI Dr. Watt Climes for your rectal bleeding. You will get a phone call to schedule this appointment.  Checking blood counts today I'll see you in a few weeks for your physical.  Be well, Dr. [name]

## 2014-09-18 NOTE — Assessment & Plan Note (Signed)
FMLA paperwork completed today. Will scan in chart. Pt continues to follow with sports medicine and is trying to avoid surgery if at all possible.

## 2014-09-18 NOTE — Progress Notes (Signed)
Patient ID: EDITA WEYENBERG, female   DOB: Dec 03, 1965, 49 y.o.   MRN: [id number]  HPI:  FMLA paperwork: has been seeing sports medicine for her bilateral knee osteoarthitis. When the arthritis flares up she does occasionally need to miss work, about 2-3 days out of the month. Has been using aleve, brace, injection, topical medications (voltaren). 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 Continues to follow regularly with Dr. Tamala Julian of sports med.  Rectal bleeding: has had blood in her stool twice in the last week. Has hx of a hemorrhoid but has never had a colonoscopy in the past. It was bright red blood in toilet and on toilet paper. Wasn't straining very hard. Has had some looser bowel movements. Has felt tired but not short of breath. Not taking the iron pills she has apparently been prescribed in the past.   ROS: See HPI.  [place]: hx obesity, anemia, anxiety/depression, GERD, HTN, gout  PHYSICAL EXAM: BP 147/95 mmHg  Pulse 77  Temp(Src) 98.3 F (36.8 C) (Oral)  Ht 5\' 6"  (1.676 m)  Wt 249 lb (112.946 kg)  BMI 40.21 kg/m2  LMP 08/31/2014 Gen: NAD HEENT: NCAT Abd: soft NTTP, obese, no peritoneal signs or guarding Neuro: grossly nonfocal, speech normal Ext: bilat knees without appreciable effusion or warmth. No locking palpable. MCL & LCL intact bilat Rectal: non-thrombosed external hemorrhoid present without any signs of irritation or active bleeding. Anoscopy unremarkable. Digital rectal exam with normal rectal tone, no palpable masses, no gross blood. FOBT neg.  ASSESSMENT/PLAN:  Health maintenance:  -has upcoming appt for physical in a few weeks  Rectal bleeding External hemorrhoid, but no signs of recent bleeding to explain pt's symptoms. Plan: -check CBC -refer to GI for colonoscopy   Arthritis of knee, degenerative FMLA paperwork completed today. Will scan in chart. Pt continues to follow with  sports medicine and is trying to avoid surgery if at all possible.    FOLLOW UP: F/u in a few weeks for physical  [place] J. [name], [place]

## 2014-09-20 ENCOUNTER — Encounter: Payer: Self-pay | Admitting: Family Medicine

## 2014-10-04 ENCOUNTER — Encounter: Payer: Self-pay | Admitting: Family Medicine

## 2014-10-04 ENCOUNTER — Other Ambulatory Visit (HOSPITAL_COMMUNITY)
Admission: RE | Admit: 2014-10-04 | Discharge: 2014-10-04 | Disposition: A | Discharge status: Home / Self Care | Payer: Medicare HMO | Source: Ambulatory Visit | Attending: Family Medicine | Admitting: Family Medicine

## 2014-10-04 ENCOUNTER — Ambulatory Visit (INDEPENDENT_AMBULATORY_CARE_PROVIDER_SITE_OTHER): Payer: Medicare HMO | Admitting: Family Medicine

## 2014-10-04 VITALS — BP 140/94 | HR 80 | Temp 98.0°F | Ht 66.0 in | Wt 242.3 lb

## 2014-10-04 DIAGNOSIS — Z1151 Encounter for screening for human papillomavirus (HPV): Secondary | ICD-10-CM | POA: Insufficient documentation

## 2014-10-04 DIAGNOSIS — Z124 Encounter for screening for malignant neoplasm of cervix: Secondary | ICD-10-CM | POA: Diagnosis present

## 2014-10-04 DIAGNOSIS — Z0000 Encounter for general adult medical examination without abnormal findings: Secondary | ICD-10-CM

## 2014-10-04 DIAGNOSIS — L430 Hypertrophic lichen planus: Secondary | ICD-10-CM

## 2014-10-04 DIAGNOSIS — N760 Acute vaginitis: Secondary | ICD-10-CM | POA: Insufficient documentation

## 2014-10-04 DIAGNOSIS — Z131 Encounter for screening for diabetes mellitus: Secondary | ICD-10-CM

## 2014-10-04 DIAGNOSIS — I10 Essential (primary) hypertension: Secondary | ICD-10-CM

## 2014-10-04 DIAGNOSIS — Z113 Encounter for screening for infections with a predominantly sexual mode of transmission: Secondary | ICD-10-CM | POA: Diagnosis present

## 2014-10-04 DIAGNOSIS — F418 Other specified anxiety disorders: Secondary | ICD-10-CM

## 2014-10-04 LAB — POCT GLYCOSYLATED HEMOGLOBIN (HGB A1C): HEMOGLOBIN A1C: 5.4

## 2014-10-04 NOTE — Progress Notes (Signed)
Patient ID: Barbara Reese, female   DOB: 1966-04-16, 49 y.o.   MRN: [id number]   HPI:  Patient presents today for a well woman exam.   Concerns today: none Periods: monthly, no intermenstrual bleeding, doesn't always bleed heavy Contraception: none (female partner) Pelvic symptoms: none Sexual activity: one female partner in last year STD Screening: willing to do testing today Pap smear status: due today, no hx of abnormals Exercise: walks 4 times weekly Smoking: never Alcohol: occasionally, 1-2 drinks Drugs: none currently Advance directives: wants to be full code  HTN: currently taking HCTZ 12.5mg  daily. Denies CP or SOB. Does not check BP at home.  Anxiety: needs rx for anxiety med while flying to [facility] next month for her birthday. Typically has done well with low dose benzo for this. Has intense fear of flying.  Lichen planus: needs refill of clobetasol. Works well for her.  ROS: See HPI. ROS sheet positive for blood in stool (in process of referral to GI for this, has not recurred), frequent urination (drinks water and urinates 2-3 x at night, doesn't bother her enough to take meds), stress, joint pains (chronic knee pain).  [place]:  Cancers in family: paternal grandmother with breast cancer, otherwise negative  PHYSICAL EXAM: BP 140/94 mmHg  Pulse 80  Temp(Src) 98 F (36.7 C) (Oral)  Ht 5\' 6"  (1.676 m)  Wt 242 lb 4.8 oz (109.907 kg)  BMI 39.13 kg/m2  LMP 08/29/2014 Gen: NAD, pleasant, cooperative HEENT: NCAT, PERRL, no palpable thyromegaly or anterior cervical lymphadenopathy Heart: RRR, no murmurs Lungs: CTAB, NWOB Abdomen: soft, nontender to palpation Neuro: grossly nonfocal, speech normal GU: normal appearing external genitalia without lesions. Vagina is moist with white discharge. Cervix normal in appearance. No cervical motion tenderness or tenderness on bimanual exam. No adnexal masses.   ASSESSMENT/PLAN:  Health maintenance:  -STD screening:  gc/chlamydia collected today per pt request. Defer HIV & RPR as pt wants to avoid venipuncture. -pap smear: done today -mammogram: UTD -lipid screening: done May 2014, defer for now -advance directives: per discussion with pt today, would like to be full code -diabetes screening: A1c performed today per pt request -tetanus and flu shot - pt states these should be up to date per her job requirements. She will attempt to find records and let us know dates of last immunizations. -handout given on health maintenance topics  Hypertension BP mildly elevated today at 140/94. Suspect this may be related to stress of being at [place] office. Asked pt to recheck BP at home, if persistently gets 140/90 to call us for likely med adjustment.   Lichen planus hypertrophicus Stable. Refill clobetasol today. If needs refills again in the future would need OV to discuss in greater detail.   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 Will rx small quantity of lorazepam 0.5mg  tablets for use during the flight to [facility] next month.    FOLLOW UP: F/u in 3 months for HTN  [place] J. [name], [place]

## 2014-10-04 NOTE — Patient Instructions (Addendum)
We can check for HIV and syphilis next time you need bloodwork. Check your BP at home a few times a week. If you are getting 140/90 or higher regularly please come back in to talk about your BP. Otherwise come back in 3 months for your blood pressure. Call us with the date of your last flu shot & tetanus shot if you can find those records. I will call you if your test results are not normal.  Otherwise, I will send you a letter.  If you do not hear from me with in 2 weeks please call our office.      Be well, Dr. Pollie Meyer    Health Maintenance Adopting a healthy lifestyle and getting preventive care can go a long way to promote health and wellness. Talk with your health care provider about what schedule of regular examinations is right for you. This is a good chance for you to check in with your provider about disease prevention and staying healthy. In between checkups, there are plenty of things you can do on your own. Experts have done a lot of research about which lifestyle changes and preventive measures are most likely to keep you healthy. Ask your health care provider for more information. WEIGHT AND DIET  Eat a healthy diet  Be sure to include plenty of vegetables, fruits, low-fat dairy products, and lean protein.  Do not eat a lot of foods high in solid fats, added sugars, or salt.  Get regular exercise. This is one of the most important things you can do for your health.  Most adults should exercise for at least 150 minutes each week. The exercise should increase your heart rate and make you sweat (moderate-intensity exercise).  Most adults should also do strengthening exercises at least twice a week. This is in addition to the moderate-intensity exercise.  Maintain a healthy weight  Body mass index (BMI) is a measurement that can be used to identify possible weight problems. It estimates body fat based on height and weight. Your health care provider can help determine your BMI  and help you achieve or maintain a healthy weight.  For females 34 years of age and older:   A BMI below 18.5 is considered underweight.  A BMI of 18.5 to 24.9 is normal.  A BMI of 25 to 29.9 is considered overweight.  A BMI of 30 and above is considered obese.  Watch levels of cholesterol and blood lipids  You should start having your blood tested for lipids and cholesterol at 49 years of age, then have this test every 5 years.  You may need to have your cholesterol levels checked more often if:  Your lipid or cholesterol levels are high.  You are older than 49 years of age.  You are at high risk for heart disease.  CANCER SCREENING   Lung Cancer  Lung cancer screening is recommended for adults 66-80 years old who are at high risk for lung cancer because of a history of smoking.  A yearly low-dose CT scan of the lungs is recommended for people who:  Currently smoke.  Have quit within the past 15 years.  Have at least a 30-pack-year history of smoking. A pack year is smoking an average of one pack of cigarettes a day for 1 year.  Yearly screening should continue until it has been 15 years since you quit.  Yearly screening should stop if you develop a health problem that would prevent you from having lung cancer  treatment.  Breast Cancer  Practice breast self-awareness. This means understanding how your breasts normally appear and feel.  It also means doing regular breast self-exams. Let your health care provider know about any changes, no matter how small.  If you are in your 20s or 30s, you should have a clinical breast exam (CBE) by a health care provider every 1-3 years as part of a regular health exam.  If you are 78 or older, have a CBE every year. Also consider having a breast X-ray (mammogram) every year.  If you have a family history of breast cancer, talk to your health care provider about genetic screening.  If you are at high risk for breast cancer,  talk to your health care provider about having an MRI and a mammogram every year.  Breast cancer gene (BRCA) assessment is recommended for women who have family members with BRCA-related cancers. BRCA-related cancers include:  Breast.  Ovarian.  Tubal.  Peritoneal cancers.  Results of the assessment will determine the need for genetic counseling and BRCA1 and BRCA2 testing. Cervical Cancer Routine pelvic examinations to screen for cervical cancer are no longer recommended for nonpregnant women who are considered low risk for cancer of the pelvic organs (ovaries, uterus, and vagina) and who do not have symptoms. A pelvic examination may be necessary if you have symptoms including those associated with pelvic infections. Ask your health care provider if a screening pelvic exam is right for you.   The Pap test is the screening test for cervical cancer for women who are considered at risk.  If you had a hysterectomy for a problem that was not cancer or a condition that could lead to cancer, then you no longer need Pap tests.  If you are older than 65 years, and you have had normal Pap tests for the past 10 years, you no longer need to have Pap tests.  If you have had past treatment for cervical cancer or a condition that could lead to cancer, you need Pap tests and screening for cancer for at least 20 years after your treatment.  If you no longer get a Pap test, assess your risk factors if they change (such as having a new sexual partner). This can affect whether you should start being screened again.  Some women have medical problems that increase their chance of getting cervical cancer. If this is the case for you, your health care provider may recommend more frequent screening and Pap tests.  The human papillomavirus (HPV) test is another test that may be used for cervical cancer screening. The HPV test looks for the virus that can cause cell changes in the cervix. The cells collected  during the Pap test can be tested for HPV.  The HPV test can be used to screen women 81 years of age and older. Getting tested for HPV can extend the interval between normal Pap tests from three to five years.  An HPV test also should be used to screen women of any age who have unclear Pap test results.  After 49 years of age, women should have HPV testing as often as Pap tests.  Colorectal Cancer  This type of cancer can be detected and often prevented.  Routine colorectal cancer screening usually begins at 49 years of age and continues through 49 years of age.  Your health care provider may recommend screening at an earlier age if you have risk factors for colon cancer.  Your health care provider may also  recommend using home test kits to check for hidden blood in the stool.  A small camera at the end of a tube can be used to examine your colon directly (sigmoidoscopy or colonoscopy). This is done to check for the earliest forms of colorectal cancer.  Routine screening usually begins at age 4.  Direct examination of the colon should be repeated every 5-10 years through 49 years of age. However, you may need to be screened more often if early forms of precancerous polyps or small growths are found. Skin Cancer  Check your skin from head to toe regularly.  Tell your health care provider about any new moles or changes in moles, especially if there is a change in a mole's shape or color.  Also tell your health care provider if you have a mole that is larger than the size of a pencil eraser.  Always use sunscreen. Apply sunscreen liberally and repeatedly throughout the day.  Protect yourself by wearing long sleeves, pants, a wide-brimmed hat, and sunglasses whenever you are outside. HEART DISEASE, DIABETES, AND HIGH BLOOD PRESSURE   Have your blood pressure checked at least every 1-2 years. High blood pressure causes heart disease and increases the risk of stroke.  If you are  between 72 years and 30 years old, ask your health care provider if you should take aspirin to prevent strokes.  Have regular diabetes screenings. This involves taking a blood sample to check your fasting blood sugar level.  If you are at a normal weight and have a low risk for diabetes, have this test once every three years after 49 years of age.  If you are overweight and have a high risk for diabetes, consider being tested at a younger age or more often. PREVENTING INFECTION  Hepatitis B  If you have a higher risk for hepatitis B, you should be screened for this virus. You are considered at high risk for hepatitis B if:  You were born in a country where hepatitis B is common. Ask your health care provider which countries are considered high risk.  Your parents were born in a high-risk country, and you have not been immunized against hepatitis B (hepatitis B vaccine).  You have HIV or AIDS.  You use needles to inject street drugs.  You live with someone who has hepatitis B.  You have had sex with someone who has hepatitis B.  You get hemodialysis treatment.  You take certain medicines for conditions, including cancer, organ transplantation, and autoimmune conditions. Hepatitis C  Blood testing is recommended for:  Everyone born from [birth year] through 1965.  Anyone with known risk factors for hepatitis C. Sexually transmitted infections (STIs)  You should be screened for sexually transmitted infections (STIs) including gonorrhea and chlamydia if:  You are sexually active and are younger than 49 years of age.  You are older than 49 years of age and your health care provider tells you that you are at risk for this type of infection.  Your sexual activity has changed since you were last screened and you are at an increased risk for chlamydia or gonorrhea. Ask your health care provider if you are at risk.  If you do not have HIV, but are at risk, it may be recommended that you  take a prescription medicine daily to prevent HIV infection. This is called pre-exposure prophylaxis (PrEP). You are considered at risk if:  You are sexually active and do not regularly use condoms or know the HIV status  of your partner(s).  You take drugs by injection.  You are sexually active with a partner who has HIV. Talk with your health care provider about whether you are at high risk of being infected with HIV. If you choose to begin PrEP, you should first be tested for HIV. You should then be tested every 3 months for as long as you are taking PrEP.  PREGNANCY   If you are premenopausal and you may become pregnant, ask your health care provider about preconception counseling.  If you may become pregnant, take 400 to 800 micrograms (mcg) of folic acid every day.  If you want to prevent pregnancy, talk to your health care provider about birth control (contraception). OSTEOPOROSIS AND MENOPAUSE   Osteoporosis is a disease in which the bones lose minerals and strength with aging. This can result in serious bone fractures. Your risk for osteoporosis can be identified using a bone density scan.  If you are 34 years of age or older, or if you are at risk for osteoporosis and fractures, ask your health care provider if you should be screened.  Ask your health care provider whether you should take a calcium or vitamin D supplement to lower your risk for osteoporosis.  Menopause may have certain physical symptoms and risks.  Hormone replacement therapy may reduce some of these symptoms and risks. Talk to your health care provider about whether hormone replacement therapy is right for you.  HOME CARE INSTRUCTIONS   Schedule regular health, dental, and eye exams.  Stay current with your immunizations.   Do not use any tobacco products including cigarettes, chewing tobacco, or electronic cigarettes.  If you are pregnant, do not drink alcohol.  If you are breastfeeding, limit how  much and how often you drink alcohol.  Limit alcohol intake to no more than 1 drink per day for nonpregnant women. One drink equals 12 ounces of beer, 5 ounces of wine, or 1 ounces of hard liquor.  Do not use street drugs.  Do not share needles.  Ask your health care provider for help if you need support or information about quitting drugs.  Tell your health care provider if you often feel depressed.  Tell your health care provider if you have ever been abused or do not feel safe at home. Document Released: 02/23/2011 Document Revised: 12/25/2013 Document Reviewed: 07/12/2013 [facility] Patient Information 2015 [place], [place]. This information is not intended to replace advice given to you by your health care provider. Make sure you discuss any questions you have with your health care provider.

## 2014-10-08 DIAGNOSIS — F418 Other specified anxiety disorders: Secondary | ICD-10-CM | POA: Insufficient documentation

## 2014-10-08 LAB — CYTOLOGY - PAP

## 2014-10-08 NOTE — Assessment & Plan Note (Signed)
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 Will rx small quantity of lorazepam 0.5mg  tablets for use during the flight to [facility] next month.

## 2014-10-08 NOTE — Assessment & Plan Note (Signed)
Stable. Refill clobetasol today. If needs refills again in the future would need OV to discuss in greater detail.

## 2014-10-08 NOTE — Assessment & Plan Note (Signed)
BP mildly elevated today at 140/94. Suspect this may be related to stress of being at [place] office. Asked pt to recheck BP at home, if persistently gets 140/90 to call us for likely med adjustment.

## 2014-10-09 ENCOUNTER — Other Ambulatory Visit: Payer: Self-pay | Admitting: Family Medicine

## 2014-10-09 ENCOUNTER — Telehealth: Payer: Self-pay | Admitting: Family Medicine

## 2014-10-09 MED ORDER — LORAZEPAM 0.5 MG PO TABS: ORAL_TABLET | ORAL | Status: DC

## 2014-10-09 MED ORDER — CLOBETASOL PROPIONATE 0.05 % EX OINT: TOPICAL_OINTMENT | Freq: Every day | CUTANEOUS | Status: DC | PRN

## 2014-10-09 NOTE — Telephone Encounter (Signed)
[place] red team, please call pt and let her know:  I forgot to give her a script for lorazepam for her during her appointment. I will fax this over to her pharmacy ([facility]) for her to pick up. Also sent in rx for clobetasol.  Thanks! [name], MD

## 2014-10-09 NOTE — Telephone Encounter (Signed)
Patient informed, expressed understanding. 

## 2014-10-26 ENCOUNTER — Emergency Department (INDEPENDENT_AMBULATORY_CARE_PROVIDER_SITE_OTHER)
Admission: EM | Admit: 2014-10-26 | Discharge: 2014-10-26 | Disposition: A | Discharge status: Home / Self Care | Payer: Managed Care, Other (non HMO) | Source: Home / Self Care | Attending: Family Medicine | Admitting: Family Medicine

## 2014-10-26 ENCOUNTER — Encounter (HOSPITAL_COMMUNITY): Payer: Self-pay | Admitting: Emergency Medicine

## 2014-10-26 DIAGNOSIS — J020 Streptococcal pharyngitis: Secondary | ICD-10-CM

## 2014-10-26 DIAGNOSIS — I10 Essential (primary) hypertension: Secondary | ICD-10-CM

## 2014-10-26 HISTORY — DX: Essential (primary) hypertension: I10

## 2014-10-26 LAB — POCT RAPID STREP A: STREPTOCOCCUS, GROUP A SCREEN (DIRECT): POSITIVE — AB

## 2014-10-26 MED ORDER — KETOROLAC TROMETHAMINE 60 MG/2ML IM SOLN
INTRAMUSCULAR | Status: AC
  Filled 2014-10-26: qty 2

## 2014-10-26 MED ORDER — PENICILLIN G BENZATHINE 1200000 UNIT/2ML IM SUSP
2.4000 10*6.[IU] | Freq: Once | INTRAMUSCULAR | Status: AC
  Administered 2014-10-26: 2.4 10*6.[IU] via INTRAMUSCULAR

## 2014-10-26 MED ORDER — KETOROLAC TROMETHAMINE 60 MG/2ML IM SOLN
60.0000 mg | Freq: Once | INTRAMUSCULAR | Status: AC
  Administered 2014-10-26: 60 mg via INTRAMUSCULAR

## 2014-10-26 MED ORDER — PENICILLIN G BENZATHINE 1200000 UNIT/2ML IM SUSP
INTRAMUSCULAR | Status: AC
  Filled 2014-10-26: qty 4

## 2014-10-26 NOTE — ED Provider Notes (Signed)
CSN: [id number]     Arrival date & time 10/26/14  0914 History   First MD Initiated Contact with Patient 10/26/14 1009     Chief Complaint  Patient presents with  . URI   (Consider location/radiation/quality/duration/timing/severity/associated sxs/prior Treatment) HPI    2 days ago developed chills, nasal congestion, HA, and sore throat. Advil sinus w/ some improvement. Fever to 101 at home. Nasal saline w/ improvement. Symptoms are constant and getting worse. Denies abdominal pain, chest pain, shortness breath, nausea, vomiting, diarrhea, syncope.  Past Medical History  Diagnosis Date  . Anemia   . Allergic rhinitis   . Depressive disorder   . Insomnia   . Osteoarthritis   . GERD (gastroesophageal reflux disease)   . OSA (obstructive sleep apnea)   . 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 Hypertension    Past Surgical History  Procedure Laterality Date  . Cholecystectomy  1987  . Knee surgery  2004    left knee- Dr. Percell Miller   Family History  Problem Relation Age of Onset  . Osteoarthritis Mother   . Hypertension Mother   . Diabetes Father   . Hypertension Sister   . Heart attack Brother   . Heart attack Sister   . Allergies Mother   . Allergies Son   . Allergies Sister   . Asthma Son   . Clotting disorder Sister   . Rheum arthritis Mother    History  Substance Use Topics  . Smoking status: Never Smoker   . Smokeless tobacco: Never Used  . Alcohol Use: Yes     Comment: 2 glasses per weekend   OB History    No data available     Review of Systems Per HPI with all other pertinent systems negative.   Allergies  Review of patient's allergies indicates no known allergies.  Home Medications   Prior to Admission medications   Medication Sig Start Date End Date Taking? Authorizing Provider  clobetasol ointment (TEMOVATE) 0.05 % Apply topically daily as needed. 10/09/14   [name], MD  diclofenac sodium (VOLTAREN) 1 % GEL Apply 2 g topically 4  (four) times daily. 05/17/14   Deanna M Didiano, DO  docusate sodium (COLACE) 100 MG capsule Take 1 capsule (100 mg total) by mouth 2 (two) times daily as needed for constipation. 06/20/13   [name], MD  ferrous sulfate (MEIJER FERROUS SULFATE) 325 (65 FE) MG tablet Take 325 mg by mouth daily.     Historical Provider, MD  fluticasone (FLONASE) 50 MCG/ACT nasal spray Place 1 spray into both nostrils daily. 02/15/14   [name], MD  HYDROCHLOROTHIAZIDE PO Take 12.5 mg by mouth daily.     Historical Provider, MD  LORazepam (ATIVAN) 0.5 MG tablet Take 1 tablet 30 mins prior to plane ride.  May take additional tablet if needed. 10/09/14   [name], MD  pantoprazole (PROTONIX) 40 MG tablet Take 1 tablet (40 mg total) by mouth daily. 05/04/14   [name], MD  traMADol (ULTRAM) 50 MG tablet Take 1 tablet (50 mg total) by mouth every 6 (six) hours as needed. 05/23/14   Stefanie Libel, MD   BP 118/78 mmHg  Pulse 79  Temp(Src) 99.2 F (37.3 C) (Oral)  Resp 18  SpO2 98%  LMP 10/21/2014 Physical Exam  Constitutional: She is oriented to person, place, and time. She appears well-developed and well-nourished.  HENT:  Head: Normocephalic.  Pharyngeal injection, cervical adenopathy  Eyes: EOM are normal. Pupils  are equal, round, and reactive to light.  Neck: Normal range of motion.  Cardiovascular: Normal rate, normal heart sounds and intact distal pulses.   Pulmonary/Chest: Effort normal and breath sounds normal.  Abdominal: Soft. Bowel sounds are normal.  Musculoskeletal: Normal range of motion.  Neurological: She is alert and oriented to person, place, and time.  Skin: Skin is warm.  Psychiatric: She has a normal mood and affect. Her behavior is normal. Judgment and thought content normal.    ED Course  Procedures (including critical care time) Labs Review Labs Reviewed  POCT RAPID STREP A ([place]) - Abnormal; Notable for the following:     Streptococcus, Group A Screen (Direct) POSITIVE (*)    All other components within normal limits    Imaging Review No results found.   MDM   1. Strep throat   2. Essential hypertension     rapid strep positive Patient requesting shot as opposed to pills due to severe pain with swallowing. discussed possible resistance and need for further antibiotics at this therapy does not work. Patient still wanting shot. Bicillin 2.4 million units given IM Toradol 60 IM given  Patient calls back with return of symptoms oral antibiotic regimen of amoxicillin 500 twice a day for 7 days.  Precautions given and all questions answered  Linna Darner, MD Family Medicine 10/26/2014, 10:30 AM    Waldemar Dickens, MD 10/26/14 1030

## 2014-10-26 NOTE — ED Notes (Signed)
C/o cold sx onset Wednesday Sx include: ST, HA, fevers, nasal congestion Taking OTC cold meds w/no relief Alert, no signs of acute distress.

## 2014-10-26 NOTE — Discharge Instructions (Signed)
You were adequately treated for your strep throat infection and eye clinic with an antibiotic shot. Sometimes this is not able to fully clear the infection and if you notice her symptoms returning and another 1-3 days please call back for further antibiotics. Please restart your Advil in 24 hours. Please delay well-hydrated and get plenty of rest.

## 2014-12-26 ENCOUNTER — Other Ambulatory Visit: Payer: Self-pay | Admitting: Family Medicine

## 2015-03-05 ENCOUNTER — Ambulatory Visit (INDEPENDENT_AMBULATORY_CARE_PROVIDER_SITE_OTHER): Payer: Managed Care, Other (non HMO) | Admitting: Family Medicine

## 2015-03-05 ENCOUNTER — Encounter: Payer: Self-pay | Admitting: Family Medicine

## 2015-03-05 VITALS — BP 139/90 | HR 87 | Temp 98.4°F | Ht 66.0 in | Wt 251.9 lb

## 2015-03-05 DIAGNOSIS — I10 Essential (primary) hypertension: Secondary | ICD-10-CM

## 2015-03-05 DIAGNOSIS — L430 Hypertrophic lichen planus: Secondary | ICD-10-CM | POA: Diagnosis not present

## 2015-03-05 DIAGNOSIS — M170 Bilateral primary osteoarthritis of knee: Secondary | ICD-10-CM

## 2015-03-05 DIAGNOSIS — K219 Gastro-esophageal reflux disease without esophagitis: Secondary | ICD-10-CM

## 2015-03-05 MED ORDER — HYDROCHLOROTHIAZIDE 12.5 MG PO CAPS: 12.5000 mg | ORAL_CAPSULE | Freq: Every day | ORAL | Status: DC

## 2015-03-05 MED ORDER — PANTOPRAZOLE SODIUM 40 MG PO TBEC: 40.0000 mg | DELAYED_RELEASE_TABLET | Freq: Every day | ORAL | Status: DC

## 2015-03-05 MED ORDER — CLOBETASOL PROPIONATE 0.05 % EX OINT: TOPICAL_OINTMENT | Freq: Every day | CUTANEOUS | Status: DC | PRN

## 2015-03-05 NOTE — Assessment & Plan Note (Signed)
FMLA paperwork completed today. She will continue to follow with Dr. Tamala Julian of sports medicine.

## 2015-03-05 NOTE — Assessment & Plan Note (Signed)
Stable. Refilled protonix.

## 2015-03-05 NOTE — Progress Notes (Signed)
Patient ID: Barbara Reese, female   DOB: 1966/03/15, 49 y.o.   MRN: [id number]  HPI:  Bump on mons: has had this for about 1 month. Thinks it's due to shaving pubic hair with electric shaver. Is nontender and has not drained. Not changing at all.  FMLA paperwork: hx of bilat knee osteoarthritis. Needs FMLA paperwork completed q6 months. Works in daycare with toddlers, requires lots of standing and stooping. Misses about 2-3 days a month for her knee pain. Followed by Dr. Tamala Julian of sports medicine, gets corticosteroid injections every 3 months. Pain worse with rainy weather. Had MRI R knee within last year, showing torn meniscus.  GERD: needs protonix refilled. Doing well on this medication.  Lichen planus: uses clobetasol cream a few times per week as needed. Helps control her itching. Needs refill.  HTN: denies CP or SOB. Tolerating HCTZ 12.5mg  daily.   ROS: See HPI.  [place]: hx anemia, anxiety, arthritis, depression, HTN, lichen planus, OSA  PHYSICAL EXAM: BP 139/90 mmHg  Pulse 87  Temp(Src) 98.4 F (36.9 C) (Oral)  Ht 5\' 6"  (1.676 m)  Wt 251 lb 14.4 oz (114.261 kg)  BMI 40.68 kg/m2  LMP 02/11/2015 Gen: NAD, pleasant, cooperative HEENT: NCAT Heart: RRR no murmur Lungs: CTAB NWOB Neuro: grossly nonfocal speech normal Ext: No appreciable lower extremity edema bilaterally Skin: scattered hyperpigmented small nodules on legs, occasionally pruritic. Small area of induration on upper line of pubic hair on L side without tenderness, fluctuance.  ASSESSMENT/PLAN:  Lichen planus hypertrophicus Stable. Refilled clobetasol for prn use.  Hypertension Well controlled. Continue current regimen. Return for fasting CMET & lipids.  Arthritis of knee, degenerative FMLA paperwork completed today. She will continue to follow with Dr. Tamala Julian of sports medicine.  GERD Stable. Refilled protonix.  Ingrown hair on mons pubis:  -avoid shaving -apply warm compresses 2-3x/day -return if  worsening, redness, drainage, etc  FOLLOW UP: F/u in 3 months for HTN  [place] J. [name], [place]

## 2015-03-05 NOTE — Assessment & Plan Note (Signed)
Well controlled. Continue current regimen. Return for fasting CMET & lipids.

## 2015-03-05 NOTE — Patient Instructions (Signed)
Did FMLA paperwork today Refilled medicines See me in 3 months for high blood pressure  Be well, Dr. [name]

## 2015-03-05 NOTE — Assessment & Plan Note (Signed)
Stable. Refilled clobetasol for prn use.

## 2015-03-15 ENCOUNTER — Other Ambulatory Visit: Payer: Managed Care, Other (non HMO)

## 2015-03-15 DIAGNOSIS — I10 Essential (primary) hypertension: Secondary | ICD-10-CM

## 2015-03-15 LAB — COMPREHENSIVE METABOLIC PANEL
AST: 15 U/L (ref 0–37)
Albumin: 4.1 g/dL (ref 3.5–5.2)
Alkaline Phosphatase: 41 U/L (ref 39–117)
BILIRUBIN TOTAL: 0.7 mg/dL (ref 0.2–1.2)
BUN: 9 mg/dL (ref 6–23)
CO2: 26 mEq/L (ref 19–32)
CREATININE: 0.94 mg/dL (ref 0.50–1.10)
Calcium: 9.5 mg/dL (ref 8.4–10.5)
Chloride: 101 mEq/L (ref 96–112)
GLUCOSE: 92 mg/dL (ref 70–99)
Potassium: 3.9 mEq/L (ref 3.5–5.3)
Sodium: 140 mEq/L (ref 135–145)
Total Protein: 7.8 g/dL (ref 6.0–8.3)

## 2015-03-15 LAB — LIPID PANEL
CHOL/HDL RATIO: 2.9 ratio
CHOLESTEROL: 207 mg/dL — AB (ref 0–200)
HDL: 71 mg/dL (ref >=46)
LDL Cholesterol: 122 mg/dL — ABNORMAL HIGH (ref 0–99)
Triglycerides: 69 mg/dL (ref <150)
VLDL: 14 mg/dL (ref 0–40)

## 2015-03-15 NOTE — Progress Notes (Signed)
CMP AND FLP DONE TODAY Barbara Reese 

## 2015-03-20 ENCOUNTER — Encounter: Payer: Self-pay | Admitting: Family Medicine

## 2015-03-20 ENCOUNTER — Telehealth: Payer: Self-pay | Admitting: Family Medicine

## 2015-03-20 NOTE — Telephone Encounter (Signed)
Pt called and would like her lab results. jw

## 2015-03-20 NOTE — Telephone Encounter (Signed)
Patient with slightly elevated lipids, will forward to PCP to see if there is anything specific she wants [place] to tell patient regarding this.

## 2015-03-20 NOTE — Telephone Encounter (Signed)
Please let patient know that cholesterol levels were mildly elevated, but not enough to the point that she needs cholesterol medication. Recommend healthy low fat diet, and regular physical activity. Her liver and kidney function were good.  I will also send her a copy of the labwork by mail.  Thanks, [name], MD

## 2015-03-21 NOTE — Telephone Encounter (Signed)
Patient informed of message from MD, expressed understanding. 

## 2015-03-21 NOTE — Telephone Encounter (Signed)
Left message on voicemail for patient to call back. 

## 2015-04-19 ENCOUNTER — Encounter: Payer: Self-pay | Admitting: Family Medicine

## 2015-04-19 ENCOUNTER — Other Ambulatory Visit (INDEPENDENT_AMBULATORY_CARE_PROVIDER_SITE_OTHER): Payer: Managed Care, Other (non HMO)

## 2015-04-19 ENCOUNTER — Ambulatory Visit (INDEPENDENT_AMBULATORY_CARE_PROVIDER_SITE_OTHER): Payer: Managed Care, Other (non HMO) | Admitting: Family Medicine

## 2015-04-19 VITALS — BP 124/82 | HR 82 | Wt 247.0 lb

## 2015-04-19 DIAGNOSIS — M25561 Pain in right knee: Secondary | ICD-10-CM

## 2015-04-19 DIAGNOSIS — M25562 Pain in left knee: Secondary | ICD-10-CM | POA: Diagnosis not present

## 2015-04-19 DIAGNOSIS — M170 Bilateral primary osteoarthritis of knee: Secondary | ICD-10-CM | POA: Diagnosis not present

## 2015-04-19 MED ORDER — TRAMADOL HCL 50 MG PO TABS: 50.0000 mg | ORAL_TABLET | Freq: Four times a day (QID) | ORAL | Status: DC | PRN

## 2015-04-19 NOTE — Progress Notes (Signed)
[facility] [place],  [id number] Phone: [phone] Subjective:     CC bilaterally  Barbara Reese is a 49 y.o. female coming in with complaint of right knee pain. Patient is a very pleasant 49 year old female who is coming in with right knee pain. Patient does have severe osteophytic changes as well as a meniscal tear. Patient elected to treat it conservatively. Patient was given an injection back in January and has been did doing very well. Patient take tramadol as needed but this is very minimal. Pain started coming back over the last several weeks.  Patient states that it is unfortunate seems to be in both knees and mostly over the medial aspect of the knees. Denies any new radiation down the leg any numbness or tingling. Patient states though that it is very sore. Sometimes can keep her up at night. Denies any giving out on her at this time.     Past medical history, social, surgical and family history all reviewed in electronic medical record.   Review of Systems: No headache, visual changes, nausea, vomiting, diarrhea, constipation, dizziness, abdominal pain, skin rash, fevers, chills, night sweats, weight loss, swollen lymph nodes, body aches, joint swelling, muscle aches, chest pain, shortness of breath, mood changes.   Objective Blood pressure 124/82, pulse 82, weight 247 lb (112.038 kg).  General: No apparent distress alert and oriented x3 mood and affect normal, dressed appropriately. obese HEENT: Pupils equal, extraocular movements intact  Respiratory: Patient's speak in full sentences and does not appear short of breath  Cardiovascular: No lower extremity edema, non tender, no erythema  Skin: Warm dry intact with no signs of infection or rash on extremities or on axial skeleton.  Abdomen: Soft nontender  Neuro: Cranial nerves II through XII are intact, neurovascularly intact in all extremities with 2+ DTRs  and 2+ pulses.  Lymph: No lymphadenopathy of posterior or anterior cervical chain or axillae bilaterally.  Gait normal with good balance and coordination.  MSK:  Non tender with full range of motion and good stability and symmetric strength and tone of shoulders, elbows, wrist, hip, and ankles bilaterally.  Knee: Right Normal to inspection with no erythema or effusion or obvious bony abnormalities. Palpation reveals patient does have medial joint line tenderness ROM full in flexion and extension and lower leg rotation. Ligaments with solid consistent endpoints including ACL, PCL, LCL, MCL. Positive Mcmurray's, Apley's, and Thessalonian tests still present. Non painful patellar compression. Patellar glide without crepitus. Patellar and quadriceps tendons unremarkable. Hamstring and quadriceps strength is normal.   Left knee exam shows severe medial joint line tenderness but full range of motion. Neurovascular intact distally with mild crepitus of the knee. Negative McMurray's.    Procedure: Real-time Ultrasound Guided Injection of right knee Device: GE Logiq E  Ultrasound guided injection is preferred based studies that show increased duration, increased effect, greater accuracy, decreased procedural pain, increased response rate, and decreased cost with ultrasound guided versus blind injection.  Verbal informed consent obtained.  Time-out conducted.  Noted no overlying erythema, induration, or other signs of local infection.  Skin prepped in a sterile fashion.  Local anesthesia: Topical Ethyl chloride.  With sterile technique and under real time ultrasound guidance: With a 22-gauge 2 inch needle patient was injected with 4 cc of 0.5% Marcaine and 1 cc of Kenalog 40 mg/dL. This was from a superior lateral approach.  Completed without difficulty  Pain immediately resolved suggesting accurate placement of  the medication.  Advised to call if fevers/chills, erythema, induration, drainage, or  persistent bleeding.  Images permanently stored and available for review in the ultrasound unit.  Impression: Technically successful ultrasound guided injection.   Procedure: Real-time Ultrasound Guided Injection of left knee Device: GE Logiq E  Ultrasound guided injection is preferred based studies that show increased duration, increased effect, greater accuracy, decreased procedural pain, increased response rate, and decreased cost with ultrasound guided versus blind injection.  Verbal informed consent obtained.  Time-out conducted.  Noted no overlying erythema, induration, or other signs of local infection.  Skin prepped in a sterile fashion.  Local anesthesia: Topical Ethyl chloride.  With sterile technique and under real time ultrasound guidance: With a 22-gauge 2 inch needle patient was injected with 4 cc of 0.5% Marcaine and 1 cc of Kenalog 40 mg/dL. This was from a superior lateral approach.  Completed without difficulty  Pain immediately resolved suggesting accurate placement of the medication.  Advised to call if fevers/chills, erythema, induration, drainage, or persistent bleeding.  Images permanently stored and available for review in the ultrasound unit.  Impression: Technically successful ultrasound guided injection.    Impression and Recommendations:     This case required medical decision making of moderate complexity.

## 2015-04-19 NOTE — Assessment & Plan Note (Signed)
Patient given bilateral injections. Patient tolerated the procedures very well. We discussed icing regimen and home exercise. We discussed which activities to do a which was potentially avoid. We discussed the possibility of custom orthotics as well as viscous supplementation if this does not seem to help. Patient has responded very well to conservative therapy and encourage weight loss, and staying active as well as proper shoewear.

## 2015-04-19 NOTE — Patient Instructions (Addendum)
Good to see you Happy Friday! Ice at the end of the day Wear brace when needed Stay active and wear good shoes Tylenol 325mg  to 650mg  3 times daily See me when you need me.

## 2015-05-16 ENCOUNTER — Telehealth: Payer: Self-pay | Admitting: Family Medicine

## 2015-05-16 NOTE — Telephone Encounter (Signed)
Barbara Reese is a [employer] employee and she called and states that because of her torn meniscus, degenerative joint disease, and arthritis in her knee she is unable to lift more than 30 pounds. She is supposed to start a new part-time job with [employer] on Monday 05/20/2015 and they already have her FMLA paperwork, they just need a note stating that she is unable to lift more than 30 pounds. She is kindly hoping that this can be taken care of in a timely manner so that she can meet her needs as a new employee with that company. Please contact Barbara Reese when the note is ready for pick up, she works close by and can easily come pick it up as soon as it is ready. Thank you, Fonda Kinder, ASA

## 2015-05-16 NOTE — Telephone Encounter (Signed)
Letter written and signed. Will place at front desk. Please let patient know it's ready. Thanks! [name], MD

## 2015-05-16 NOTE — Telephone Encounter (Signed)
Will forward to PCP 

## 2015-05-17 NOTE — Telephone Encounter (Signed)
Patient informed. 

## 2015-06-05 ENCOUNTER — Other Ambulatory Visit: Payer: Self-pay | Admitting: Family Medicine

## 2015-06-07 ENCOUNTER — Other Ambulatory Visit: Payer: Self-pay | Admitting: *Deleted

## 2015-06-07 MED ORDER — LORAZEPAM 0.5 MG PO TABS: ORAL_TABLET | ORAL | Status: DC

## 2015-06-07 NOTE — Telephone Encounter (Signed)
Pt called and was checking on her status on her Ativan. She will be leaving for her trip next week. jw

## 2015-08-05 ENCOUNTER — Encounter: Payer: Self-pay | Admitting: Family Medicine

## 2015-08-05 ENCOUNTER — Ambulatory Visit (INDEPENDENT_AMBULATORY_CARE_PROVIDER_SITE_OTHER): Payer: Managed Care, Other (non HMO) | Admitting: Family Medicine

## 2015-08-05 VITALS — BP 142/90 | HR 83 | Temp 98.3°F | Ht 66.0 in | Wt 247.0 lb

## 2015-08-05 DIAGNOSIS — J020 Streptococcal pharyngitis: Secondary | ICD-10-CM | POA: Diagnosis not present

## 2015-08-05 DIAGNOSIS — N939 Abnormal uterine and vaginal bleeding, unspecified: Secondary | ICD-10-CM | POA: Diagnosis not present

## 2015-08-05 DIAGNOSIS — J029 Acute pharyngitis, unspecified: Secondary | ICD-10-CM | POA: Diagnosis not present

## 2015-08-05 DIAGNOSIS — N923 Ovulation bleeding: Secondary | ICD-10-CM

## 2015-08-05 DIAGNOSIS — M170 Bilateral primary osteoarthritis of knee: Secondary | ICD-10-CM

## 2015-08-05 DIAGNOSIS — I10 Essential (primary) hypertension: Secondary | ICD-10-CM

## 2015-08-05 LAB — POCT RAPID STREP A (OFFICE): RAPID STREP A SCREEN: NEGATIVE

## 2015-08-05 MED ORDER — AMOXICILLIN 500 MG PO CAPS: 500.0000 mg | ORAL_CAPSULE | Freq: Two times a day (BID) | ORAL | Status: DC

## 2015-08-05 MED ORDER — TRAMADOL HCL 50 MG PO TABS: 50.0000 mg | ORAL_TABLET | Freq: Four times a day (QID) | ORAL | Status: DC | PRN

## 2015-08-05 NOTE — Patient Instructions (Signed)
Check BP at home several times Return if >140/90 Will call you when [facility] paperwork ready Getting ultrasound of pelvis. Will also need endometrial biopsy Refilled tramadol  See me in 3-4 months.  Be well, Dr. [name]

## 2015-08-05 NOTE — Progress Notes (Signed)
Date of Visit: 08/05/2015   HPI:  FMLA - needs paperwork completed for her knee arthritis. Follows with sports medicine about every 3 months for injections. Flares 2-3 times per month causing her to miss work. Uses ice and tramadol during flares. Needs tramadol refilled.  Period irregularities - thinks may be perimenopausal. Not on birth control for many years. In October missed her cycle. Since then has had bleeding in between periods. Also notes hot flashes. Presently sexually active with only female partner. Denies any possibility of pregnancy.   Sore throat - works in [employer]. A coworker and several kids she cares for have strep. Has had sore throat today. No fever or cough. Mild runny nose.   ROS: See HPI.  [place]: history of obesity, depression/anxiety, gerd, osa, hypertension   PHYSICAL EXAM: BP 142/90 mmHg  Pulse 83  Temp(Src) 98.3 F (36.8 C) (Oral)  Ht 5\' 6"  (1.676 m)  Wt 247 lb (112.038 kg)  BMI 39.89 kg/m2 Gen: no acute distress, pleasant, cooperative HEENT: oropharynx clear but minimally visualized due to large tongue (high malampati score). No anterior cervical LAD. Tympanic membranes clear bilaterally Heart: regular rate and rhythm no murmur Lungs: clear to auscultation bilaterally normal work of breathing  Neuro: alert, grossly nonfocal speech normal Ext: atraumatic Abdomen: soft nontender to palpation, normoactive bowel sounds, no pelvic tenderness  ASSESSMENT/PLAN:  Arthritis of knee, degenerative Will complete FMLA paperwork & return to patient.  Refill tramadol Continue to follow with sports medicine regularly  Intermenstrual bleeding Likely due to perimenopause but given age and obesity warrants evaluation for endometrial malignancy Start with pelvic ultrasound. Will need endometrial biopsy but will obtain u/s first. i will call her with results & we will then get biopsy scheduled in women's health clinic.  Hypertension BP noted high, including on  recheck. Patient would like to wait and see what BP is at home instead of adjusting medicine. She will call us if persistently above 140/90.  Sore throat Rapid strep negative but with known exposures will give rx for amoxicillin, use if sore throat does not improve.  FOLLOW UP: F/u in 3-4 months for routine medical care  [place] J. [name], [place]

## 2015-08-07 ENCOUNTER — Telehealth: Payer: Self-pay | Admitting: Family Medicine

## 2015-08-07 NOTE — Telephone Encounter (Signed)
Patient informed that FMLA paperwork is complete and ready for pick up.  Forms copied for scanning in record.  Derl Barrow, RN

## 2015-08-07 NOTE — Telephone Encounter (Signed)
Received FMLA paperwork from patient during her visit on Monday. Paperwork has been completed. Will return to [facility]. Patient planning to come by the office & pick it up. Please inform her it is ready. Will also need to be scanned into chart.  Thanks, [name], MD

## 2015-08-08 DIAGNOSIS — N923 Ovulation bleeding: Secondary | ICD-10-CM | POA: Insufficient documentation

## 2015-08-08 DIAGNOSIS — J029 Acute pharyngitis, unspecified: Secondary | ICD-10-CM | POA: Insufficient documentation

## 2015-08-08 NOTE — Assessment & Plan Note (Signed)
Likely due to perimenopause but given age and obesity warrants evaluation for endometrial malignancy Start with pelvic ultrasound. Will need endometrial biopsy but will obtain u/s first. i will call her with results & we will then get biopsy scheduled in women's health clinic.

## 2015-08-08 NOTE — Assessment & Plan Note (Signed)
BP noted high, including on recheck. Patient would like to wait and see what BP is at home instead of adjusting medicine. She will call us if persistently above 140/90.

## 2015-08-08 NOTE — Assessment & Plan Note (Signed)
Will complete FMLA paperwork & return to patient.  Refill tramadol Continue to follow with sports medicine regularly

## 2015-08-12 ENCOUNTER — Ambulatory Visit (HOSPITAL_COMMUNITY)
Admission: RE | Admit: 2015-08-12 | Discharge: 2015-08-12 | Disposition: A | Discharge status: Home / Self Care | Payer: Managed Care, Other (non HMO) | Source: Ambulatory Visit | Attending: Family Medicine | Admitting: Family Medicine

## 2015-08-12 DIAGNOSIS — N926 Irregular menstruation, unspecified: Secondary | ICD-10-CM | POA: Diagnosis not present

## 2015-08-12 DIAGNOSIS — D259 Leiomyoma of uterus, unspecified: Secondary | ICD-10-CM | POA: Diagnosis not present

## 2015-08-12 DIAGNOSIS — N840 Polyp of corpus uteri: Secondary | ICD-10-CM | POA: Diagnosis not present

## 2015-08-12 DIAGNOSIS — N923 Ovulation bleeding: Secondary | ICD-10-CM

## 2015-08-15 ENCOUNTER — Telehealth: Payer: Self-pay | Admitting: Family Medicine

## 2015-08-15 DIAGNOSIS — N923 Ovulation bleeding: Secondary | ICD-10-CM

## 2015-08-15 NOTE — Telephone Encounter (Signed)
Called patient to discuss u/s results. Pelvic u/s shows thickened endometrium which is echogenic and heterogeneous, with a small 49mm polyp.  Discussed results with GYN on call Dr. Kennon Rounds, who recommends endometrial bx and referral to GYN. Called patient to discuss. Appointment scheduled in [facility] [place] clinic on 1/19 here at [facility] with Dr. Nori Riis for endometrial bx. Will also refer to GYN and ask that appointment be after the endometrial biopsy so that consulting gynecologist will have biopsy result. Patient agreeable and appreciative.  [name], MD

## 2015-09-12 ENCOUNTER — Ambulatory Visit (INDEPENDENT_AMBULATORY_CARE_PROVIDER_SITE_OTHER): Payer: Managed Care, Other (non HMO) | Admitting: Family Medicine

## 2015-09-12 VITALS — BP 135/87 | HR 73 | Temp 98.2°F | Ht 66.0 in | Wt 284.4 lb

## 2015-09-12 DIAGNOSIS — R938 Abnormal findings on diagnostic imaging of other specified body structures: Secondary | ICD-10-CM

## 2015-09-12 DIAGNOSIS — R9389 Abnormal findings on diagnostic imaging of other specified body structures: Secondary | ICD-10-CM

## 2015-09-12 DIAGNOSIS — N939 Abnormal uterine and vaginal bleeding, unspecified: Secondary | ICD-10-CM | POA: Diagnosis not present

## 2015-09-12 DIAGNOSIS — N923 Ovulation bleeding: Secondary | ICD-10-CM

## 2015-09-12 NOTE — Patient Instructions (Signed)

## 2015-09-12 NOTE — Progress Notes (Signed)
Patient ID: Barbara Reese, female   DOB: 09-Sep-1965, 50 y.o.   MRN: [id number]  Patient with intermenstrual bleeding recently. Missed cycle entirely in October of 2016 and since has had very irregular bleeding.  Had PUS (results below).has hx of fibroids. Would really like to stop having periods all together. Would consider hysterectomy if it were offered/ indicated.  Pap normal with good specimen, negative HPV hi risk strains in 2016.  PUS 08/12/2015 Measurements: 14.8 x 6.3 x 8.5 cm. Diffusely heterogeneous and enlarged with multiple fibroids evident. Dominant posterior fundal fibroid measures 7 x 5 x 6.6 cm. At least 4 other fibroids demonstrated throughout the uterine body. Anterior fundal fibroid measures 2.3 cm. Anterior body uterine fibroid measures 3.6 cm. Posterior uterine body fibroid measures 2.5 cm. These have enlarged compared to 11/18/2004.  Endometrium  Thickness: 15.3 mm. Endometrium appears echogenic, thickened and heterogeneous. Small echogenic endometrial polyp measures 10 mm by ultrasound. Consider further evaluation with sonohysterogram. Right ovary Mesurements: 2.8 x 2.2 x 1.9 cm. Normal appearance/no adnexal mass. Left ovary Not visualized secondary to obscuring bowel gas Other findings No abnormal free fluid. IMPRESSION: Enlarged fibroid uterus now measuring 14.8 cm in length. Fibroids have enlarged compared to 2006. Thickened heterogeneous endometrium with a small echogenic measurable 10 mm endometrial polyp, nonspecific. Consider further evaluation with sonohysterogram versus repeat endometrial sampling. Normal right ovary Nonvisualized left ovary because of bowel gas No free fluid     PROCEDURE NOTE: Endometrial Biopsy Patient given informed consent, signed copy in the chart.  Appropriate time out taken. . The patient was placed in the lithotomy position and the cervix brought into view with sterile speculum.  The portio of cervix  was cleansed x 3 with betadine swabs.  A tenaculum was placed in the anterior lip of the cervix.  A uterine sound was used to measure the uterus.. A pipelle was introduced  into the uterus, suction created,  and an endometrial sample was obtained. All equipment was removed and accounted for.   The patient tolerated the procedure well.  Minimal spotting type bleeding.  Patient given post procedure instructions. I will notify her of any pathology results.  A/P/ Abnormal PUS with thickened heterogeneous endometrium, endometrial polyp, fibroids (enlarged since exam of 2006). perimenopause 1. Endometrial biopsy today  Will get path, if no sign atypia, can just follow bleeding for a while. She may be just having irregularity secondary to peri-menopause. Alternatively, she may wish to see GYN and have eval for hysteroscopy / rempval of polyp as that may be contributing to bleeding. She also expressed interest in hysterectomy--I am not sure if that is indicated at this time but would be happy to refer her to GYN for further discussion. Main goal for today was to get adequate sample of endometrium to assess for atypia as the presence of atypia would impact next steps in above algorithm. We discussed these treatment steps and options with her today. Time spent in face to face counseling was 10 minutes.

## 2015-09-18 ENCOUNTER — Telehealth: Payer: Self-pay | Admitting: *Deleted

## 2015-09-18 ENCOUNTER — Encounter: Payer: Self-pay | Admitting: Family Medicine

## 2015-09-18 DIAGNOSIS — N840 Polyp of corpus uteri: Secondary | ICD-10-CM

## 2015-09-18 DIAGNOSIS — N923 Ovulation bleeding: Secondary | ICD-10-CM

## 2015-09-18 DIAGNOSIS — D259 Leiomyoma of uterus, unspecified: Secondary | ICD-10-CM

## 2015-09-18 NOTE — Telephone Encounter (Signed)
Patient was calling regarding her endometrial bx from last week.  Will forward to Dr. Nori Riis who performed this. Patient can be reached at either number. Work [phone] and cell [phone] [employer]

## 2015-09-18 NOTE — Telephone Encounter (Signed)
Normal endo biopsy We discussed She wouldlike to see gyn and I will send referral Dorcas Mcmurray

## 2015-09-23 ENCOUNTER — Encounter: Payer: Self-pay | Admitting: Obstetrics and Gynecology

## 2015-10-14 ENCOUNTER — Encounter: Payer: Self-pay | Admitting: Obstetrics and Gynecology

## 2015-10-14 ENCOUNTER — Other Ambulatory Visit: Payer: Self-pay | Admitting: Obstetrics and Gynecology

## 2015-10-14 ENCOUNTER — Ambulatory Visit (INDEPENDENT_AMBULATORY_CARE_PROVIDER_SITE_OTHER): Payer: Managed Care, Other (non HMO) | Admitting: Obstetrics and Gynecology

## 2015-10-14 VITALS — BP 140/85 | HR 78 | Temp 98.6°F | Ht 66.0 in | Wt 251.1 lb

## 2015-10-14 DIAGNOSIS — N938 Other specified abnormal uterine and vaginal bleeding: Secondary | ICD-10-CM

## 2015-10-14 DIAGNOSIS — Z1231 Encounter for screening mammogram for malignant neoplasm of breast: Secondary | ICD-10-CM

## 2015-10-14 NOTE — Progress Notes (Signed)
Patient ID: Barbara Reese, female   DOB: [birth year], 50 y.o.   MRN: [id number] 50 yo presenting today for further evaluation/treatment of DUB. Patient reports amenorrhea in October followed by a prolonged period in November. She has then experienced a normal 5-day cycle in December and January. She also endorses some occasional hot flashes and night sweats, along with some mood swings and insomnia. Patient is otherwise without complaints.  Past Medical History  Diagnosis Date  . Anemia   . Allergic rhinitis   . Depressive disorder   . Insomnia   . Osteoarthritis   . GERD (gastroesophageal reflux disease)   . OSA (obstructive sleep apnea)   . 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 Hypertension    Past Surgical History  Procedure Laterality Date  . Cholecystectomy  1987  . Knee surgery  2004    left knee- Dr. Percell Miller   Family History  Problem Relation Age of Onset  . Osteoarthritis Mother   . Hypertension Mother   . Diabetes Father   . Hypertension Sister   . Heart attack Brother   . Heart attack Sister   . Allergies Mother   . Allergies Son   . Allergies Sister   . Asthma Son   . Clotting disorder Sister   . Rheum arthritis Mother    Social History  Substance Use Topics  . Smoking status: Never Smoker   . Smokeless tobacco: Never Used  . Alcohol Use: Yes     Comment: 2 glasses per weekend   ROS See pertinent in HPI  Blood pressure 140/85, pulse 78, temperature 98.6 F (37 C), temperature source Oral, height 5\' 6"  (1.676 m), weight 251 lb 1.6 oz (113.898 kg), last menstrual period 10/09/2015. GENERAL: Well-developed, well-nourished female in no acute distress.  HEENT: Normocephalic, atraumatic. Sclerae anicteric.  NECK: Supple. Normal thyroid.  LUNGS: Clear to auscultation bilaterally.  HEART: Regular rate and rhythm. BREASTS: Symmetric in size. No palpable masses or lymphadenopathy, skin changes, or nipple drainage. ABDOMEN: Soft, nontender, nondistended. No  organomegaly. PELVIC: Normal external female genitalia. Vagina is pink and rugated.  Normal discharge. Normal appearing cervix. Uterus is normal in size.  No adnexal mass or tenderness. EXTREMITIES: No cyanosis, clubbing, or edema, 2+ distal pulses.  07/2015 ultrasound Measurements: 14.8 x 6.3 x 8.5 cm. Diffusely heterogeneous and enlarged with multiple fibroids evident. Dominant posterior fundal fibroid measures 7 x 5 x 6.6 cm. At least 4 other fibroids demonstrated throughout the uterine body. Anterior fundal fibroid measures 2.3 cm. Anterior body uterine fibroid measures 3.6 cm. Posterior uterine body fibroid measures 2.5 cm. These have enlarged compared to 11/18/2004.  Endometrium  Thickness: 15.3 mm. Endometrium appears echogenic, thickened and heterogeneous. Small echogenic endometrial polyp measures 10 mm by ultrasound. Consider further evaluation with sonohysterogram. Right ovary Mesurements: 2.8 x 2.2 x 1.9 cm. Normal appearance/no adnexal mass. Left ovary Not visualized secondary to obscuring bowel gas Other findings No abnormal free fluid. IMPRESSION: Enlarged fibroid uterus now measuring 14.8 cm in length. Fibroids have enlarged compared to 2006. Thickened heterogeneous endometrium with a small echogenic measurable 10 mm endometrial polyp, nonspecific. Consider further evaluation with sonohysterogram versus repeat endometrial sampling. Normal right ovary Nonvisualized left ovary because of bowel gas No free fluid  08/2015 Endometrial biopsy - No endometrial tissue to evaluate  A/P 50 yo with an episode of DUB in November 2016 - Discussed endometrial biopsy results and ultrasound findings - Discussed the risks and benefits of D&C including but not limited to risks of  bleeding, infection, uterine perforation and damage to adjacent organs. Patient verbalized understanding and wishes to proceed - Patient will be scheduled for [facility] with hysteroscopy in April  per her request. - Also discussed some behavioral changes to help her cope with her vasomotor symptoms - RTC post op and prn

## 2015-10-14 NOTE — Patient Instructions (Signed)
Dilation and Curettage or Vacuum Curettage, Care After Refer to this sheet in the next few weeks. These instructions provide you with information on caring for yourself after your procedure. Your health care provider may also give you more specific instructions. Your treatment has been planned according to current medical practices, but problems sometimes occur. Call your health care provider if you have any problems or questions after your procedure. WHAT TO EXPECT AFTER THE PROCEDURE After your procedure, it is typical to have light cramping and bleeding. This may last for 2 days to 2 weeks after the procedure. HOME CARE INSTRUCTIONS   Do not drive for 24 hours.  Wait 1 week before returning to strenuous activities.  Take your temperature 2 times a day for 4 days and write it down. Provide these temperatures to your health care provider if you develop a fever.  Avoid long periods of standing.  Avoid heavy lifting, pushing, or pulling. Do not lift anything heavier than 10 pounds (4.5 kg).  Limit stair climbing to once or twice a day.  Take rest periods often.  You may resume your usual diet.  Drink enough fluids to keep your urine clear or pale yellow.  Your usual bowel function should return. If you have constipation, you may:  Take a mild laxative with permission from your health care provider.  Add fruit and bran to your diet.  Drink more fluids.  Take showers instead of baths until your health care provider gives you permission to take baths.  Do not go swimming or use a hot tub until your health care provider approves.  Try to have someone with you or available to you the first 24-48 hours, especially if you were given a general anesthetic.  Do not douche, use tampons, or have sex (intercourse) for 2 weeks after the procedure.  Only take over-the-counter or prescription medicines as directed by your health care provider. Do not take aspirin. It can cause  bleeding.  Follow up with your health care provider as directed. SEEK MEDICAL CARE IF:   You have increasing cramps or pain that is not relieved with medicine.  You have abdominal pain that does not seem to be related to the same area of earlier cramping and pain.  You have bad smelling vaginal discharge.  You have a rash.  You are having problems with any medicine. SEEK IMMEDIATE MEDICAL CARE IF:   You have bleeding that is heavier than a normal menstrual period.  You have a fever.  You have chest pain.  You have shortness of breath.  You feel dizzy or feel like fainting.  You pass out.  You have pain in your shoulder strap area.  You have heavy vaginal bleeding with or without blood clots. MAKE SURE YOU:   Understand these instructions.  Will watch your condition.  Will get help right away if you are not doing well or get worse.   This information is not intended to replace advice given to you by your health care provider. Make sure you discuss any questions you have with your health care provider.   Document Released: 08/07/2000 Document Revised: 08/15/2013 Document Reviewed: 03/09/2013 Elsevier Interactive Patient Education 2016 [employer]  

## 2015-10-14 NOTE — Progress Notes (Signed)
Scheduled Mammogram for March 24th @ 1100.   Pt notified.

## 2015-10-15 ENCOUNTER — Encounter (HOSPITAL_COMMUNITY): Payer: Self-pay | Admitting: *Deleted

## 2015-10-28 ENCOUNTER — Encounter (HOSPITAL_COMMUNITY): Payer: Self-pay | Admitting: *Deleted

## 2015-10-31 ENCOUNTER — Other Ambulatory Visit: Payer: Self-pay | Admitting: Family Medicine

## 2015-11-12 ENCOUNTER — Ambulatory Visit (INDEPENDENT_AMBULATORY_CARE_PROVIDER_SITE_OTHER): Payer: Managed Care, Other (non HMO) | Admitting: Family Medicine

## 2015-11-12 ENCOUNTER — Encounter: Payer: Self-pay | Admitting: Family Medicine

## 2015-11-12 VITALS — BP 136/86 | HR 89 | Ht 66.0 in | Wt 246.0 lb

## 2015-11-12 DIAGNOSIS — S93402A Sprain of unspecified ligament of left ankle, initial encounter: Secondary | ICD-10-CM | POA: Diagnosis not present

## 2015-11-12 NOTE — Progress Notes (Signed)
Barbara Reese - 50 y.o. female MRN [id number]  Date of birth: 13-May-1966  CC: Left ankle pain  SUBJECTIVE:   HPI  Barbara Reese is a very pleasant 50 year old female with multiple medical issues who presents with 1 day of left ankle pain. She slipped on a wet kitchen floor 3 days ago and had mild soreness after that. She awoke yesterday to have significant discomfort. She has been taking naproxen as well as Ultram with minimal to no relief. She has a history of gout responded well to oral prednisone as well as a I am steroid several years ago. She is concerned she may have a gouty attack again. She has not noticed any warmth of her left ankle although there is swelling over the anterior aspect of her lateral malleolus. She is unsure as to the exact mechanism of injury. She has no previous history of an ankle sprain. She was able to take steps after her initial injury and today. A compression wrap as well as a postop shoe have helped a little bit.  ROS:     As above, no fevers chills or night sweats. Mild left ankle swelling but otherwise no joint swelling. No redness of her skin. No warmth  HISTORY: Past Medical, Surgical, Social, and Family History Reviewed & Updated per EMR.  Pertinent Historical Findings include: Hypertension, OSA, GERD, OA of the knee, history of gout of the left great toe, moderate obesity, prediabetes  OBJECTIVE: BP 136/86 mmHg  Pulse 89  Ht 5\' 6"  (1.676 m)  Wt 246 lb (111.585 kg)  BMI 39.72 kg/m2  LMP 10/09/2015  Physical Exam Calm, no acute distress Nonlabored breathing No rash  Ankle: Left No significant erythema or warmth of around the ankle. Mild swelling over the anterior lateral aspect of the ankle in the region of the ATFL Range of motion is full in all direction Strength is 5/5 in all directions. Passive range of motion is without significant discomfort. Stable lateral and medial ligaments; squeeze test and kleiger test unremarkable; Talar dome  nontender; No pain at base of 5th MT; No tenderness over cuboid; No tenderness over N spot or navicular prominence No tenderness on posterior aspects of lateral and medial malleolus No sign of peroneal tendon subluxations; Pain with talar tilt and mild pain with anterior drawer. No increased laxity Negative tarsal tunnel tinel's Able to walk 4 steps. Able to stand on her toes. Mild arch collapse of her left longitudinal arch.  Ultrasound: Long and short axis views showed no significant capsular distention.  MEDICATIONS, LABS & OTHER ORDERS: Previous Medications   CLOBETASOL OINTMENT (TEMOVATE) 0.05 %    Apply topically daily as needed.   DICLOFENAC SODIUM (VOLTAREN) 1 % GEL    Apply 2 g topically 4 (four) times daily.   DOCUSATE SODIUM (COLACE) 100 MG CAPSULE    Take 1 capsule (100 mg total) by mouth 2 (two) times daily as needed for constipation.   FERROUS SULFATE (MEIJER FERROUS SULFATE) 325 (65 FE) MG TABLET    Take 325 mg by mouth daily. Reported on 11/12/2015   FLUTICASONE (FLONASE) 50 MCG/ACT NASAL SPRAY    Place 1 spray into both nostrils daily.   HYDROCHLOROTHIAZIDE (MICROZIDE) 12.5 MG CAPSULE    TAKE 1 CAPSULE EVERY DAY   LORAZEPAM (ATIVAN) 0.5 MG TABLET    Take 1 tablet 30 mins prior to plane ride.  May take additional tablet if needed.   NAPROXEN SODIUM (ANAPROX) 220 MG TABLET    Take 220 mg  by mouth 2 (two) times daily with a meal.   PANTOPRAZOLE (PROTONIX) 40 MG TABLET    Take 1 tablet (40 mg total) by mouth daily.   TRAMADOL (ULTRAM) 50 MG TABLET    Take 1 tablet (50 mg total) by mouth every 6 (six) hours as needed.   Modified Medications   No medications on file   New Prescriptions   No medications on file   Discontinued Medications   No medications on file  No orders of the defined types were placed in this encounter.   ASSESSMENT & PLAN: Grade 1 left lateral ankle sprain: Patient is able to stand on her toes and walk with a minimally antalgic gait. We have advised  her to take naproxen 500 mg twice a day for the next week. Additionally we've provided her with an ASO brace and I do feel like she could continue working and this may be the best thing for her. She will work on continued mobility of the left ankle with ankle ABCs. We will see her back in 1-2 weeks depending on how she is doing. She may continue her Ultram if needed. This does not seem like a gouty attack like she was concerned about. If it is 500 mg of naproxen twice daily should also help this.

## 2015-11-13 ENCOUNTER — Telehealth: Payer: Self-pay | Admitting: Family Medicine

## 2015-11-13 MED ORDER — IBUPROFEN 800 MG PO TABS: 800.0000 mg | ORAL_TABLET | Freq: Three times a day (TID) | ORAL | Status: DC | PRN

## 2015-11-13 NOTE — Telephone Encounter (Signed)
Done, have her stop the other medicine.

## 2015-11-13 NOTE — Telephone Encounter (Signed)
Patient states she fell and sprained her ankle.  She states she was seen at [facility] yesterday.  She was told to take aleve.  She is taking 500 mg twice a day along with tramadol.  Patient states she is still having pain.  States coworker gave her a motion 800.  States this brought pain down some.  She would like to know if Dr. Tamala Julian could send a script for this into her pharmacy at [facility] on [address].

## 2015-11-13 NOTE — Telephone Encounter (Signed)
Spoke with patient, confirmed prescription sent into pharmacy

## 2015-11-15 ENCOUNTER — Ambulatory Visit: Payer: Managed Care, Other (non HMO)

## 2015-11-28 ENCOUNTER — Ambulatory Visit: Admit: 2015-11-28 | Payer: Managed Care, Other (non HMO) | Admitting: Obstetrics and Gynecology

## 2015-11-28 SURGERY — DILATATION AND CURETTAGE /HYSTEROSCOPY
Anesthesia: Choice | Site: Vagina

## 2015-12-16 ENCOUNTER — Ambulatory Visit
Admission: RE | Admit: 2015-12-16 | Discharge: 2015-12-16 | Disposition: A | Discharge status: Home / Self Care | Payer: Managed Care, Other (non HMO) | Source: Ambulatory Visit | Attending: Obstetrics and Gynecology | Admitting: Obstetrics and Gynecology

## 2015-12-16 DIAGNOSIS — Z1231 Encounter for screening mammogram for malignant neoplasm of breast: Secondary | ICD-10-CM

## 2015-12-19 NOTE — Patient Instructions (Addendum)
Your procedure is scheduled on:  Monday, 12/30/15  Enter through the Main Entrance of [facility] at:11:30am  Pick up the phone at the desk and dial [phone].  Call this number if you have problems the morning of surgery: [phone].  Remember: Do NOT eat food: after Midnight Sunday Do NOT drink clear liquids after: 9am Monday, 5/9 Take these medicines the morning of surgery with a SIP OF WATER: Protonix, HCTZ, Zyrtec and Flonase nasal spray if needed  Do NOT wear jewelry (body piercing), metal hair clips/bobby pins, make-up, or nail polish. Do NOT wear lotions, powders, or perfumes.  You may wear deoderant. Do NOT shave for 48 hours prior to surgery. Do NOT bring valuables to the hospital.  Have a responsible adult drive you home and stay with you for 24 hours after your procedure.  Home with family member - to be arranged prior to surgey.  Patient informed, can not take taxi/bus home after discharged.

## 2015-12-20 ENCOUNTER — Encounter (HOSPITAL_COMMUNITY): Payer: Self-pay

## 2015-12-20 ENCOUNTER — Encounter (HOSPITAL_COMMUNITY)
Admission: RE | Admit: 2015-12-20 | Discharge: 2015-12-20 | Disposition: A | Discharge status: Home / Self Care | Payer: Managed Care, Other (non HMO) | Source: Ambulatory Visit | Attending: Obstetrics and Gynecology | Admitting: Obstetrics and Gynecology

## 2015-12-20 ENCOUNTER — Other Ambulatory Visit: Payer: Self-pay

## 2015-12-20 DIAGNOSIS — N939 Abnormal uterine and vaginal bleeding, unspecified: Secondary | ICD-10-CM | POA: Insufficient documentation

## 2015-12-20 DIAGNOSIS — Z01812 Encounter for preprocedural laboratory examination: Secondary | ICD-10-CM | POA: Insufficient documentation

## 2015-12-20 DIAGNOSIS — F419 Anxiety disorder, unspecified: Secondary | ICD-10-CM | POA: Insufficient documentation

## 2015-12-20 DIAGNOSIS — G4733 Obstructive sleep apnea (adult) (pediatric): Secondary | ICD-10-CM | POA: Insufficient documentation

## 2015-12-20 DIAGNOSIS — D649 Anemia, unspecified: Secondary | ICD-10-CM | POA: Insufficient documentation

## 2015-12-20 DIAGNOSIS — K219 Gastro-esophageal reflux disease without esophagitis: Secondary | ICD-10-CM | POA: Insufficient documentation

## 2015-12-20 DIAGNOSIS — Z01810 Encounter for preprocedural cardiovascular examination: Secondary | ICD-10-CM | POA: Diagnosis not present

## 2015-12-20 DIAGNOSIS — I10 Essential (primary) hypertension: Secondary | ICD-10-CM | POA: Diagnosis not present

## 2015-12-20 HISTORY — DX: Anxiety disorder, unspecified: F41.9

## 2015-12-20 HISTORY — DX: Irritable bowel syndrome, unspecified: K58.9

## 2015-12-20 LAB — CBC
HEMATOCRIT: 32 % — AB (ref 36.0–46.0)
HEMOGLOBIN: 10.3 g/dL — AB (ref 12.0–15.0)
MCH: 24 pg — ABNORMAL LOW (ref 26.0–34.0)
MCHC: 32.2 g/dL (ref 30.0–36.0)
MCV: 74.6 fL — AB (ref 78.0–100.0)
Platelets: 369 10*3/uL (ref 150–400)
RBC: 4.29 MIL/uL (ref 3.87–5.11)
RDW: 17.4 % — ABNORMAL HIGH (ref 11.5–15.5)
WBC: 5.8 10*3/uL (ref 4.0–10.5)

## 2015-12-20 LAB — BASIC METABOLIC PANEL
ANION GAP: 4 — AB (ref 5–15)
BUN: 11 mg/dL (ref 6–20)
CO2: 28 mmol/L (ref 22–32)
Calcium: 9.2 mg/dL (ref 8.9–10.3)
Chloride: 104 mmol/L (ref 101–111)
Creatinine, Ser: 0.87 mg/dL (ref 0.44–1.00)
GFR calc Af Amer: >60 mL/min (ref >60)
GFR calc non Af Amer: >60 mL/min (ref >60)
GLUCOSE: 91 mg/dL (ref 65–99)
Potassium: 3.9 mmol/L (ref 3.5–5.1)
Sodium: 136 mmol/L (ref 135–145)

## 2015-12-20 LAB — ABO/RH: ABO/RH(D): B POS

## 2015-12-20 LAB — TYPE AND SCREEN
ABO/RH(D): B POS
Antibody Screen: NEGATIVE

## 2015-12-30 ENCOUNTER — Encounter (HOSPITAL_COMMUNITY): Payer: Self-pay | Admitting: Emergency Medicine

## 2015-12-30 ENCOUNTER — Ambulatory Visit (HOSPITAL_COMMUNITY): Payer: Managed Care, Other (non HMO) | Admitting: Anesthesiology

## 2015-12-30 ENCOUNTER — Ambulatory Visit (HOSPITAL_COMMUNITY)
Admission: RE | Disposition: A | Discharge status: Home / Self Care | Payer: Managed Care, Other (non HMO) | Source: Ambulatory Visit | Attending: Obstetrics and Gynecology

## 2015-12-30 ENCOUNTER — Ambulatory Visit (HOSPITAL_COMMUNITY)
Admission: RE | Admit: 2015-12-30 | Discharge: 2015-12-30 | Disposition: A | Discharge status: Home / Self Care | Payer: Managed Care, Other (non HMO) | Source: Ambulatory Visit | Attending: Obstetrics and Gynecology | Admitting: Obstetrics and Gynecology

## 2015-12-30 DIAGNOSIS — J309 Allergic rhinitis, unspecified: Secondary | ICD-10-CM | POA: Insufficient documentation

## 2015-12-30 DIAGNOSIS — G4700 Insomnia, unspecified: Secondary | ICD-10-CM | POA: Insufficient documentation

## 2015-12-30 DIAGNOSIS — I10 Essential (primary) hypertension: Secondary | ICD-10-CM | POA: Insufficient documentation

## 2015-12-30 DIAGNOSIS — N938 Other specified abnormal uterine and vaginal bleeding: Secondary | ICD-10-CM | POA: Diagnosis not present

## 2015-12-30 DIAGNOSIS — Z862 Personal history of diseases of the blood and blood-forming organs and certain disorders involving the immune mechanism: Secondary | ICD-10-CM | POA: Diagnosis not present

## 2015-12-30 DIAGNOSIS — K589 Irritable bowel syndrome without diarrhea: Secondary | ICD-10-CM | POA: Insufficient documentation

## 2015-12-30 DIAGNOSIS — Z79899 Other long term (current) drug therapy: Secondary | ICD-10-CM | POA: Insufficient documentation

## 2015-12-30 DIAGNOSIS — M170 Bilateral primary osteoarthritis of knee: Secondary | ICD-10-CM | POA: Diagnosis not present

## 2015-12-30 DIAGNOSIS — F419 Anxiety disorder, unspecified: Secondary | ICD-10-CM | POA: Insufficient documentation

## 2015-12-30 DIAGNOSIS — N8502 Endometrial intraepithelial neoplasia [EIN]: Secondary | ICD-10-CM | POA: Diagnosis not present

## 2015-12-30 DIAGNOSIS — F329 Major depressive disorder, single episode, unspecified: Secondary | ICD-10-CM | POA: Insufficient documentation

## 2015-12-30 DIAGNOSIS — L430 Hypertrophic lichen planus: Secondary | ICD-10-CM | POA: Diagnosis not present

## 2015-12-30 DIAGNOSIS — G4733 Obstructive sleep apnea (adult) (pediatric): Secondary | ICD-10-CM | POA: Diagnosis not present

## 2015-12-30 DIAGNOSIS — K219 Gastro-esophageal reflux disease without esophagitis: Secondary | ICD-10-CM | POA: Insufficient documentation

## 2015-12-30 HISTORY — DX: Other specified postprocedural states: Z98.890

## 2015-12-30 HISTORY — DX: Other specified postprocedural states: R11.2

## 2015-12-30 HISTORY — DX: Endometrial intraepithelial neoplasia (EIN): N85.02

## 2015-12-30 HISTORY — PX: HYSTEROSCOPY WITH D & C: SHX1775

## 2015-12-30 LAB — PREGNANCY, URINE: PREG TEST UR: NEGATIVE

## 2015-12-30 SURGERY — DILATATION AND CURETTAGE /HYSTEROSCOPY
Anesthesia: General

## 2015-12-30 MED ORDER — FENTANYL CITRATE (PF) 100 MCG/2ML IJ SOLN
25.0000 ug | INTRAMUSCULAR | Status: DC | PRN
Start: 2015-12-30 — End: 2015-12-30

## 2015-12-30 MED ORDER — PROPOFOL 10 MG/ML IV BOLUS
INTRAVENOUS | Status: DC | PRN
  Administered 2015-12-30: 100 mg via INTRAVENOUS
  Administered 2015-12-30: 50 mg via INTRAVENOUS
  Administered 2015-12-30: 200 mg via INTRAVENOUS

## 2015-12-30 MED ORDER — SCOPOLAMINE 1 MG/3DAYS TD PT72
1.0000 | MEDICATED_PATCH | Freq: Once | TRANSDERMAL | Status: DC
  Administered 2015-12-30: 1.5 mg via TRANSDERMAL

## 2015-12-30 MED ORDER — PROCHLORPERAZINE EDISYLATE 5 MG/ML IJ SOLN: 10.0000 mg | Freq: Once | INTRAMUSCULAR | Status: DC | PRN

## 2015-12-30 MED ORDER — GLYCOPYRROLATE 0.2 MG/ML IJ SOLN
INTRAMUSCULAR | Status: DC | PRN
  Administered 2015-12-30: 0.1 mg via INTRAVENOUS

## 2015-12-30 MED ORDER — DEXAMETHASONE SODIUM PHOSPHATE 4 MG/ML IJ SOLN
INTRAMUSCULAR | Status: DC | PRN
  Administered 2015-12-30: 4 mg via INTRAVENOUS

## 2015-12-30 MED ORDER — ROCURONIUM BROMIDE 100 MG/10ML IV SOLN
INTRAVENOUS | Status: AC
  Filled 2015-12-30: qty 1

## 2015-12-30 MED ORDER — PROPOFOL 10 MG/ML IV BOLUS
INTRAVENOUS | Status: AC
  Filled 2015-12-30: qty 20

## 2015-12-30 MED ORDER — FENTANYL CITRATE (PF) 100 MCG/2ML IJ SOLN
INTRAMUSCULAR | Status: DC | PRN
  Administered 2015-12-30: 50 ug via INTRAVENOUS
  Administered 2015-12-30: 100 ug via INTRAVENOUS
  Administered 2015-12-30 (×2): 50 ug via INTRAVENOUS

## 2015-12-30 MED ORDER — IBUPROFEN 600 MG PO TABS: 600.0000 mg | ORAL_TABLET | Freq: Four times a day (QID) | ORAL | Status: DC | PRN

## 2015-12-30 MED ORDER — OXYCODONE-ACETAMINOPHEN 5-325 MG PO TABS: 1.0000 | ORAL_TABLET | ORAL | Status: DC | PRN

## 2015-12-30 MED ORDER — KETOROLAC TROMETHAMINE 30 MG/ML IJ SOLN
INTRAMUSCULAR | Status: AC
Start: 2015-12-30 — End: 2015-12-30
  Filled 2015-12-30: qty 1

## 2015-12-30 MED ORDER — LACTATED RINGERS IV SOLN
INTRAVENOUS | Status: DC
  Administered 2015-12-30: 125 mL/h via INTRAVENOUS
  Administered 2015-12-30: 12:00:00 via INTRAVENOUS

## 2015-12-30 MED ORDER — DEXAMETHASONE SODIUM PHOSPHATE 4 MG/ML IJ SOLN
INTRAMUSCULAR | Status: AC
  Filled 2015-12-30: qty 1

## 2015-12-30 MED ORDER — LIDOCAINE HCL (CARDIAC) 20 MG/ML IV SOLN
INTRAVENOUS | Status: DC | PRN
  Administered 2015-12-30: 100 mg via INTRAVENOUS

## 2015-12-30 MED ORDER — LIDOCAINE HCL (CARDIAC) 20 MG/ML IV SOLN
INTRAVENOUS | Status: AC
  Filled 2015-12-30: qty 5

## 2015-12-30 MED ORDER — KETOROLAC TROMETHAMINE 30 MG/ML IJ SOLN
INTRAMUSCULAR | Status: DC | PRN
  Administered 2015-12-30: 30 mg via INTRAVENOUS

## 2015-12-30 MED ORDER — MIDAZOLAM HCL 2 MG/2ML IJ SOLN
INTRAMUSCULAR | Status: DC | PRN
  Administered 2015-12-30: 2 mg via INTRAVENOUS

## 2015-12-30 MED ORDER — ONDANSETRON HCL 4 MG/2ML IJ SOLN
INTRAMUSCULAR | Status: AC
  Filled 2015-12-30: qty 2

## 2015-12-30 MED ORDER — FENTANYL CITRATE (PF) 250 MCG/5ML IJ SOLN
INTRAMUSCULAR | Status: AC
  Filled 2015-12-30: qty 5

## 2015-12-30 MED ORDER — CHLOROPROCAINE HCL 1 % IJ SOLN
INTRAMUSCULAR | Status: DC | PRN
  Administered 2015-12-30: 10 mL

## 2015-12-30 MED ORDER — SCOPOLAMINE 1 MG/3DAYS TD PT72
MEDICATED_PATCH | TRANSDERMAL | Status: AC
  Administered 2015-12-30: 1.5 mg via TRANSDERMAL
  Filled 2015-12-30: qty 1

## 2015-12-30 MED ORDER — ONDANSETRON HCL 4 MG/2ML IJ SOLN
INTRAMUSCULAR | Status: DC | PRN
  Administered 2015-12-30: 4 mg via INTRAVENOUS

## 2015-12-30 MED ORDER — CHLOROPROCAINE HCL 1 % IJ SOLN
INTRAMUSCULAR | Status: AC
  Filled 2015-12-30: qty 30

## 2015-12-30 MED ORDER — MIDAZOLAM HCL 2 MG/2ML IJ SOLN
INTRAMUSCULAR | Status: AC
  Filled 2015-12-30: qty 2

## 2015-12-30 SURGICAL SUPPLY — 15 items
CATH ROBINSON RED A/P 16FR (CATHETERS) ×2 IMPLANT
CONTAINER PREFILL 10% NBF 60ML (FORM) ×4 IMPLANT
ELECTRODE RT ANGLE VERSAPOINT (CUTTING LOOP) IMPLANT
GLOVE BIOGEL PI IND STRL 6.5 (GLOVE) ×1 IMPLANT
GLOVE BIOGEL PI IND STRL 7.0 (GLOVE) ×1 IMPLANT
GLOVE BIOGEL PI INDICATOR 6.5 (GLOVE) ×1
GLOVE BIOGEL PI INDICATOR 7.0 (GLOVE) ×1
GLOVE SURG SS PI 6.0 STRL IVOR (GLOVE) ×2 IMPLANT
GOWN STRL REUS W/TWL LRG LVL3 (GOWN DISPOSABLE) ×4 IMPLANT
PACK VAGINAL MINOR WOMEN LF (CUSTOM PROCEDURE TRAY) ×2 IMPLANT
PAD OB MATERNITY 4.3X12.25 (PERSONAL CARE ITEMS) ×2 IMPLANT
TOWEL OR 17X24 6PK STRL BLUE (TOWEL DISPOSABLE) ×4 IMPLANT
TUBING AQUILEX INFLOW (TUBING) ×2 IMPLANT
TUBING AQUILEX OUTFLOW (TUBING) ×2 IMPLANT
WATER STERILE IRR 1000ML POUR (IV SOLUTION) ×2 IMPLANT

## 2015-12-30 NOTE — Discharge Instructions (Signed)
Hysteroscopy Hysteroscopy is a procedure used for looking inside the womb (uterus). It may be done for various reasons, including:  To evaluate abnormal bleeding, fibroid (benign, noncancerous) tumors, polyps, scar tissue (adhesions), and possibly cancer of the uterus.  To look for lumps (tumors) and other uterine growths.  To look for causes of why a woman cannot get pregnant (infertility), causes of recurrent loss of pregnancy (miscarriages), or a lost intrauterine device (IUD).  To perform a sterilization by blocking the fallopian tubes from inside the uterus. In this procedure, a thin, flexible tube with a tiny light and camera on the end of it (hysteroscope) is used to look inside the uterus. A hysteroscopy should be done right after a menstrual period to be sure you are not pregnant. LET [facility] CARE PROVIDER KNOW ABOUT:   Any allergies you have.  All medicines you are taking, including vitamins, herbs, eye drops, creams, and over-the-counter medicines.  Previous problems you or members of your family have had with the use of anesthetics.  Any blood disorders you have.  Previous surgeries you have had.  Medical conditions you have. RISKS AND COMPLICATIONS  Generally, this is a safe procedure. However, as with any procedure, complications can occur. Possible complications include:  Putting a hole in the uterus.  Excessive bleeding.  Infection.  Damage to the cervix.  Injury to other organs.  Allergic reaction to medicines.  Too much fluid used in the uterus for the procedure. BEFORE THE PROCEDURE   Ask your health care provider about changing or stopping any regular medicines.  Do not take aspirin or blood thinners for 1 week before the procedure, or as directed by your health care provider. These can cause bleeding.  If you smoke, do not smoke for 2 weeks before the procedure.  In some cases, a medicine is placed in the cervix the day before the procedure.  This medicine makes the cervix have a larger opening (dilate). This makes it easier for the instrument to be inserted into the uterus during the procedure.  Do not eat or drink anything for at least 8 hours before the surgery.  Arrange for someone to take you home after the procedure. PROCEDURE   You may be given a medicine to relax you (sedative). You may also be given one of the following:  A medicine that numbs the area around the cervix (local anesthetic).  A medicine that makes you sleep through the procedure (general anesthetic).  The hysteroscope is inserted through the vagina into the uterus. The camera on the hysteroscope sends a picture to a TV screen. This gives the surgeon a good view inside the uterus.  During the procedure, air or a liquid is put into the uterus, which allows the surgeon to see better.  Sometimes, tissue is gently scraped from inside the uterus. These tissue samples are sent to a lab for testing. AFTER THE PROCEDURE   If you had a general anesthetic, you may be groggy for a couple hours after the procedure.  If you had a local anesthetic, you will be able to go home as soon as you are stable and feel ready.  You may have some cramping. This normally lasts for a couple days.  You may have bleeding, which varies from light spotting for a few days to menstrual-like bleeding for 3-7 days. This is normal.  If your test results are not back during the visit, make an appointment with your health care provider to find out the  results.   This information is not intended to replace advice given to you by your health care provider. Make sure you discuss any questions you have with your health care provider.   Document Released: 11/16/2000 Document Revised: 05/31/2013 Document Reviewed: 03/09/2013 Elsevier Interactive Patient Education 2016 [employer].  Hysteroscopy, Care After Refer to this sheet in the next few weeks. These instructions provide you with  information on caring for yourself after your procedure. Your health care provider may also give you more specific instructions. Your treatment has been planned according to current medical practices, but problems sometimes occur. Call your health care provider if you have any problems or questions after your procedure.  WHAT TO EXPECT AFTER THE PROCEDURE After your procedure, it is typical to have the following:  You may have some cramping. This normally lasts for a couple days.  You may have bleeding. This can vary from light spotting for a few days to menstrual-like bleeding for 3-7 days. HOME CARE INSTRUCTIONS  Rest for the first 1-2 days after the procedure.  Only take over-the-counter or prescription medicines as directed by your health care provider. Do not take aspirin. It can increase the chances of bleeding.  Take showers instead of baths for 2 weeks or as directed by your health care provider.  Do not drive for 24 hours or as directed.  Do not drink alcohol while taking pain medicine.  Do not use tampons, douche, or have sexual intercourse for 2 weeks or until your health care provider says it is okay.  Take your temperature twice a day for 4-5 days. Write it down each time.  Follow your health care provider's advice about diet, exercise, and lifting.  If you develop constipation, you may:  Take a mild laxative if your health care provider approves.  Add bran foods to your diet.  Drink enough fluids to keep your urine clear or pale yellow.  Try to have someone with you or available to you for the first 24-48 hours, especially if you were given a general anesthetic.  Follow up with your health care provider as directed. SEEK MEDICAL CARE IF:  You feel dizzy or lightheaded.  You feel sick to your stomach (nauseous).  You have abnormal vaginal discharge.  You have a rash.  You have pain that is not controlled with medicine. SEEK IMMEDIATE MEDICAL CARE IF:  You  have bleeding that is heavier than a normal menstrual period.  You have a fever.  You have increasing cramps or pain, not controlled with medicine.  You have new belly (abdominal) pain.  You pass out.  You have pain in the tops of your shoulders (shoulder strap areas).  You have shortness of breath.   This information is not intended to replace advice given to you by your health care provider. Make sure you discuss any questions you have with your health care provider.   Document Released: 05/31/2013 Document Reviewed: 05/31/2013 Elsevier Interactive Patient Education 2016 [facility] Instructions  Activity: Get plenty of rest for the remainder of the day. A responsible adult should stay with you for 24 hours following the procedure.  For the next 24 hours, DO NOT: -Drive a car -Paediatric nurse -Drink alcoholic beverages -Take any medication unless instructed by your physician -Make any legal decisions or sign important papers.  Meals: Start with liquid foods such as gelatin or soup. Progress to regular foods as tolerated. Avoid greasy, spicy, heavy foods. If nausea and/or  vomiting occur, drink only clear liquids until the nausea and/or vomiting subsides. Call your physician if vomiting continues.  Special Instructions/Symptoms: Your throat may feel dry or sore from the anesthesia or the breathing tube placed in your throat during surgery. If this causes discomfort, gargle with warm salt water. The discomfort should disappear within 24 hours.  If you had a scopolamine patch placed behind your ear for the management of post- operative nausea and/or vomiting:  1. The medication in the patch is effective for 72 hours, after which it should be removed.  Wrap patch in a tissue and discard in the trash. Wash hands thoroughly with soap and water. 2. You may remove the patch earlier than 72 hours if you experience unpleasant side effects which may  include dry mouth, dizziness or visual disturbances. 3. Avoid touching the patch. Wash your hands with soap and water after contact with the patch.

## 2015-12-30 NOTE — H&P (Signed)
Barbara Reese is an 50 y.o. female presenting today for scheduled D&C with hysteroscopy for the evaluation and management of dysfunctional uterine bleeding. Patient has experienced some irregular vaginal bleeding. She describes normal monthly menses of 5 days but has experienced a prolonged period lasting nearly 1 month. She is also experiencing some vasomotor symptoms  Pertinent Gynecological History: Menses: DUB Bleeding: dysfunctional uterine bleeding Contraception: none DES exposure: denies Blood transfusions: none Sexually transmitted diseases: no past history Last mammogram: normal Date: 11/2015 Last pap: normal Date: 09/2014    Menstrual History: Patient's last menstrual period was 12/08/2015.    Past Medical History  Diagnosis Date  . Anemia   . Allergic rhinitis   . Insomnia   . GERD (gastroesophageal reflux disease)   . 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 Hypertension   . SVD (spontaneous vaginal delivery)     x 1  . OSA (obstructive sleep apnea)     Does not use CPAP.  Marland Kitchen Anxiety   . Depressive disorder     Hx  . Osteoarthritis     knees  . IBS (irritable bowel syndrome)     diet controlled, no meds  . PONV (postoperative nausea and vomiting)     Past Surgical History  Procedure Laterality Date  . Cholecystectomy  1987  . Knee surgery  2004    left knee- Dr. Percell Miller  . Upper gi endoscopy      gerd    Family History  Problem Relation Age of Onset  . Osteoarthritis Mother   . Hypertension Mother   . Diabetes Father   . Hypertension Sister   . Heart attack Brother   . Heart attack Sister   . Allergies Mother   . Allergies Son   . Allergies Sister   . Asthma Son   . Clotting disorder Sister   . Rheum arthritis Mother     Social History:  reports that she has never smoked. She has never used smokeless tobacco. She reports that she drinks about 1.2 oz of alcohol per week. She reports that she does not use illicit drugs.  Allergies: No Known  Allergies  Prescriptions prior to admission  Medication Sig Dispense Refill Last Dose  . cetirizine (ZYRTEC) 10 MG tablet Take 10 mg by mouth daily as needed for allergies.   Past Week at Unknown time  . hydrochlorothiazide (MICROZIDE) 12.5 MG capsule TAKE 1 CAPSULE EVERY DAY 90 capsule 1 12/30/2015 at Unknown time  . pantoprazole (PROTONIX) 40 MG tablet Take 1 tablet (40 mg total) by mouth daily. 90 tablet 3 12/30/2015 at Unknown time  . traMADol (ULTRAM) 50 MG tablet Take 1 tablet (50 mg total) by mouth every 6 (six) hours as needed. (Patient taking differently: Take 50 mg by mouth every 6 (six) hours as needed for moderate pain. ) 30 tablet 1 Past Month at Unknown time  . clobetasol ointment (TEMOVATE) 0.05 % Apply topically daily as needed. (Patient taking differently: Apply topically 2 (two) times daily as needed (rash). ) 45 g 2 Taking  . fluticasone (FLONASE) 50 MCG/ACT nasal spray Place 1 spray into both nostrils daily. (Patient taking differently: Place 2 sprays into both nostrils daily as needed for allergies. ) 16 g 1 Taking  . ibuprofen (ADVIL,MOTRIN) 800 MG tablet Take 1 tablet (800 mg total) by mouth every 8 (eight) hours as needed. (Patient taking differently: Take 800 mg by mouth every 8 (eight) hours as needed for mild pain or moderate pain. ) 30 tablet  2     Review of Systems  All other systems reviewed and are negative.   Blood pressure 153/98, pulse 78, temperature 97.9 F (36.6 C), temperature source Oral, resp. rate 16, last menstrual period 12/08/2015, SpO2 100 %. Physical Exam GENERAL: Well-developed, well-nourished female in no acute distress.  HEENT: Normocephalic, atraumatic. Sclerae anicteric.  NECK: Supple. Normal thyroid.  LUNGS: Clear to auscultation bilaterally.  HEART: Regular rate and rhythm. ABDOMEN: Soft, nontender, nondistended.  PELVIC: Deferred to OR EXTREMITIES: No cyanosis, clubbing, or edema, 2+ distal pulses.  Results for orders placed or performed  during the hospital encounter of 12/30/15 (from the past 24 hour(s))  Pregnancy, urine     Status: None   Collection Time: 12/30/15 11:30 AM  Result Value Ref Range   Preg Test, Ur NEGATIVE NEGATIVE    No results found. 07/2015 ultrasound Measurements: 14.8 x 6.3 x 8.5 cm. Diffusely heterogeneous and enlarged with multiple fibroids evident. Dominant posterior fundal fibroid measures 7 x 5 x 6.6 cm. At least 4 other fibroids demonstrated throughout the uterine body. Anterior fundal fibroid measures 2.3 cm. Anterior body uterine fibroid measures 3.6 cm. Posterior uterine body fibroid measures 2.5 cm. These have enlarged compared to 11/18/2004.  Endometrium  Thickness: 15.3 mm. Endometrium appears echogenic, thickened and heterogeneous. Small echogenic endometrial polyp measures 10 mm by ultrasound. Consider further evaluation with sonohysterogram. Right ovary Mesurements: 2.8 x 2.2 x 1.9 cm. Normal appearance/no adnexal mass. Left ovary Not visualized secondary to obscuring bowel gas Other findings No abnormal free fluid. IMPRESSION: Enlarged fibroid uterus now measuring 14.8 cm in length. Fibroids have enlarged compared to 2006. Thickened heterogeneous endometrium with a small echogenic measurable 10 mm endometrial polyp, nonspecific. Consider further evaluation with sonohysterogram versus repeat endometrial sampling. Normal right ovary Nonvisualized left ovary because of bowel gas No free fluid  08/2015 Endometrial biopsy - No endometrial tissue to evaluate  Assessment/Plan: 50 yo with DUB here for D&C hysteroscopy - risks, benefits and alternatives were explained including but not limited to risks of bleeding, infection, uterine perforation, and damage to adjacent organs. Patient verbalized understanding and all questions were answered  Sharlee Rufino 12/30/2015, 12:23 PM

## 2015-12-30 NOTE — Transfer of Care (Signed)
Immediate Anesthesia Transfer of Care Note  Patient: Barbara Reese  Procedure(s) Performed: Procedure(s): DILATATION AND CURETTAGE /HYSTEROSCOPY (N/A)  Patient Location: PACU  Anesthesia Type:General  Level of Consciousness: awake, alert  and oriented  Airway & Oxygen Therapy: Patient Spontanous Breathing and Patient connected to nasal cannula oxygen  Post-op Assessment: Report given to RN and Post -op Vital signs reviewed and stable  Post vital signs: Reviewed and stable  Last Vitals:  Filed Vitals:   12/30/15 1135  BP: 153/98  Pulse: 78  Temp: 36.6 C  Resp: 16    Last Pain: There were no vitals filed for this visit.    Patients Stated Pain Goal: 3 ([id number])  Complications: No apparent anesthesia complications

## 2015-12-30 NOTE — Anesthesia Procedure Notes (Signed)
Procedure Name: LMA Insertion Date/Time: 12/30/2015 1:03 PM Performed by: Flossie Dibble Pre-anesthesia Checklist: Patient identified, Timeout performed, Emergency Drugs available, Suction available and Patient being monitored Patient Re-evaluated:Patient Re-evaluated prior to inductionOxygen Delivery Method: Circle system utilized Preoxygenation: Pre-oxygenation with 100% oxygen Intubation Type: IV induction Ventilation: Mask ventilation without difficulty and Oral airway inserted - appropriate to patient size LMA: LMA inserted and LMA with gastric port inserted LMA Size: 4.0 Number of attempts: 2 Airway Equipment and Method: Patient positioned with wedge pillow Placement Confirmation: breath sounds checked- equal and bilateral and positive ETCO2 Tube secured with: Tape Dental Injury: Teeth and Oropharynx as per pre-operative assessment  Difficulty Due To: Difficulty was unanticipated Comments: LMA # 4 inserted but unable to adequately ventilate the patient. The patient was mask ventilated without difficulty and LMA  # 4 with Gastric port was inserted easily with good ventilation for the procedure. Bilateral breath sounds were clear to auscultation. Katherina Mires CRNA.

## 2015-12-30 NOTE — Anesthesia Postprocedure Evaluation (Addendum)
Anesthesia Post Note  Patient: Barbara Reese  Procedure(s) Performed: Procedure(s) (LRB): DILATATION AND CURETTAGE /HYSTEROSCOPY (N/A)  Patient location during evaluation: PACU Anesthesia Type: General Level of consciousness: awake and alert Pain management: pain level controlled Vital Signs Assessment: post-procedure vital signs reviewed and stable Respiratory status: spontaneous breathing, nonlabored ventilation, respiratory function stable and patient connected to nasal cannula oxygen Cardiovascular status: blood pressure returned to baseline and stable Postop Assessment: no signs of nausea or vomiting Anesthetic complications: no Comments: No apnea and no evidence of re-sedation.     Last Vitals:  Filed Vitals:   12/30/15 1500 12/30/15 1515  BP: 114/80 118/76  Pulse: 55 58  Temp:  36.4 C  Resp: 16 18    Last Pain:  Filed Vitals:   12/30/15 1517  PainSc: 2    Pain Goal: Patients Stated Pain Goal: 3 (12/30/15 1515)               Jovaughn Wojtaszek J

## 2015-12-30 NOTE — Anesthesia Preprocedure Evaluation (Signed)
Anesthesia Evaluation  Patient identified by MRN, date of birth, ID band Patient awake    Reviewed: Allergy & Precautions, NPO status , Patient's Chart, lab work & pertinent test results  History of Anesthesia Complications (+) PONV and history of anesthetic complications  Airway Mallampati: II  TM Distance: >3 FB Neck ROM: Full    Dental no notable dental hx.    Pulmonary sleep apnea ,    Pulmonary exam normal breath sounds clear to auscultation       Cardiovascular hypertension, Pt. on medications Normal cardiovascular exam Rhythm:Regular Rate:Normal     Neuro/Psych PSYCHIATRIC DISORDERS Anxiety Depression negative neurological ROS     GI/Hepatic Neg liver ROS, GERD  Medicated,  Endo/Other  Morbid obesity  Renal/GU negative Renal ROS  negative genitourinary   Musculoskeletal  (+) Arthritis ,   Abdominal (+) + obese,   Peds negative pediatric ROS (+)  Hematology  (+) anemia ,   Anesthesia Other Findings   Reproductive/Obstetrics negative OB ROS                             Anesthesia Physical Anesthesia Plan  ASA: III  Anesthesia Plan: General   Post-op Pain Management:    Induction: Intravenous  Airway Management Planned: LMA  Additional Equipment:   Intra-op Plan:   Post-operative Plan: Extubation in OR  Informed Consent: I have reviewed the patients History and Physical, chart, labs and discussed the procedure including the risks, benefits and alternatives for the proposed anesthesia with the patient or authorized representative who has indicated his/her understanding and acceptance.   Dental advisory given  Plan Discussed with: CRNA  Anesthesia Plan Comments:         Anesthesia Quick Evaluation

## 2015-12-30 NOTE — Op Note (Signed)
PREOPERATIVE DIAGNOSIS:  Abnormal uterine bleeding. POSTOPERATIVE DIAGNOSIS: The same PROCEDURE: Hysteroscopy, Dilation and Curettage. SURGEON:  Dr. Mora Bellman   INDICATIONS: 50 y.o. here for scheduled surgery for DUB.   Risks of surgery were discussed with the patient including but not limited to: bleeding which may require transfusion; infection which may require antibiotics; injury to uterus or surrounding organs; intrauterine scarring which may impair future fertility; need for additional procedures including laparotomy or laparoscopy; and other postoperative/anesthesia complications. Written informed consent was obtained.    FINDINGS:  A 14 week size uterus.  Diffuse proliferative endometrium.  Normal ostia bilaterally.  ANESTHESIA:   General, paracervical block. INTRAVENOUS FLUIDS:  800 ml of LR FLUID DEFICITS:  60 ml of normal saline ESTIMATED BLOOD LOSS:  Less than 20 ml SPECIMENS: Endometrial curettings sent to pathology COMPLICATIONS:  None immediate.  PROCEDURE DETAILS:  The patient received intravenous antibiotics while in the preoperative area.  She was then taken to the operating room where general anesthesia was administered and was found to be adequate.  After an adequate timeout was performed, she was placed in the dorsal lithotomy position and examined; then prepped and draped in the sterile manner.   Her bladder was catheterized for an unmeasured amount of clear, yellow urine. A speculum was then placed in the patient's vagina and a single tooth tenaculum was applied to the anterior lip of the cervix.   A paracervical block using 10 ml of 0.5% Marcaine was administered.  The cervix was sounded to 12 cm and dilated manually with Hagar dilators to accommodate the 5 mm diagnostic hysteroscope.  Once the cervix was dilated, the hysteroscope was inserted under direct visualization using normal saline as a suspension medium.  The uterine cavity was carefully examined, both ostia  were recognized, and diffusely proliferative endometrium was noted.   After further careful visualization of the uterine cavity, the hysteroscope was removed under direct visualization.  A sharp curettage was then performed to obtain a moderate amount of endometrial curettings.  The tenaculum was removed from the anterior lip of the cervix and the vaginal speculum was removed after noting good hemostasis.  The patient tolerated the procedure well and was taken to the recovery area awake, extubated and in stable condition.

## 2015-12-31 ENCOUNTER — Encounter (HOSPITAL_COMMUNITY): Payer: Self-pay | Admitting: Obstetrics and Gynecology

## 2016-01-02 ENCOUNTER — Other Ambulatory Visit: Payer: Self-pay

## 2016-01-02 ENCOUNTER — Ambulatory Visit (INDEPENDENT_AMBULATORY_CARE_PROVIDER_SITE_OTHER): Payer: Managed Care, Other (non HMO) | Admitting: Family Medicine

## 2016-01-02 ENCOUNTER — Other Ambulatory Visit: Payer: Self-pay | Admitting: Obstetrics and Gynecology

## 2016-01-02 ENCOUNTER — Encounter: Payer: Self-pay | Admitting: Family Medicine

## 2016-01-02 ENCOUNTER — Encounter (HOSPITAL_COMMUNITY): Payer: Self-pay | Admitting: Family Medicine

## 2016-01-02 VITALS — BP 126/82 | HR 56 | Wt 254.0 lb

## 2016-01-02 DIAGNOSIS — M65311 Trigger thumb, right thumb: Secondary | ICD-10-CM

## 2016-01-02 DIAGNOSIS — N8502 Endometrial intraepithelial neoplasia [EIN]: Secondary | ICD-10-CM

## 2016-01-02 HISTORY — DX: Endometrial intraepithelial neoplasia (EIN): N85.02

## 2016-01-02 MED ORDER — MEGESTROL ACETATE 40 MG PO TABS: 80.0000 mg | ORAL_TABLET | Freq: Two times a day (BID) | ORAL | Status: DC

## 2016-01-02 NOTE — Patient Instructions (Signed)
Good to se eyou  Ice 20 minutes 2 times daily. Usually after activity and before bed. pennsaid pinkie amount topically 2 times daily as needed.  Wear brace nightly for 1-2 weeks.  It will trigger for 2 more days then stop See me when you need me.

## 2016-01-02 NOTE — Progress Notes (Signed)
Patient was informed by telephone of results of D&C which demonstrated the presence of atypical hyperplasia. Recommended treatment involving a hysterectomy was reviewed with the patient. Patient is very apprehensive regarding surgery. Discussed the possibility of having this procedure done robotically and the patient understands that she will have to meet Dr. Ihor Dow first. Patient desires to postpone this surgical intervention until August due to work obligations. In the meantime, the patient was instructed to start Megace 80 mg twice daily (Rx has been e-prescribed). She is scheduled to see me on 5/22 for post op check and to answer any further questions.

## 2016-01-02 NOTE — Assessment & Plan Note (Signed)
Patient given injection today and tolerated the procedure well. We discussed icing regimen as well as bracing at night. Given topical anti-inflammatories to try. Discussed icing regimen. Discussed that this likely will wear off somewhere between 9 and 18 months and possible repeat will be necessary. Patient will come back and see me again in 3 weeks for further evaluation and treatment.

## 2016-01-02 NOTE — Progress Notes (Signed)
[name] [place], [place] [address] Phone: [phone] Subjective:     CC: Right thumb pain  [id number] Barbara Reese is a 50 y.o. female coming in with complaint of right thumb pain. Has had this for quite some time. Seems to get locked in a flexed position. States that it is severely tender to straighten up. Denies any numbness. Patient states that there feels to be a nodule on the palmar side of the thumb that can be painful. Right severity pain is 8 out of 10. Has not tried any home modalities to help this.    Past Medical History  Diagnosis Date  . Anemia   . Allergic rhinitis   . Insomnia   . GERD (gastroesophageal reflux disease)   . 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 Hypertension   . SVD (spontaneous vaginal delivery)     x 1  . OSA (obstructive sleep apnea)     Does not use CPAP.  Marland Kitchen Anxiety   . Depressive disorder     Hx  . Osteoarthritis     knees  . IBS (irritable bowel syndrome)     diet controlled, no meds  . PONV (postoperative nausea and vomiting)   . 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 Cholecystectomy  1987  . Knee surgery  2004    left knee- Dr. Percell Miller  . Upper gi endoscopy      gerd  . Hysteroscopy w/d&c N/A 12/30/2015    Procedure: DILATATION AND CURETTAGE /HYSTEROSCOPY;  Surgeon: Mora Bellman, MD;  Location: [facility];  Service: Gynecology;  Laterality: N/A;   Social History   Social History  . Marital Status: Single    Spouse Name: N/A  . Number of Children: N/A  . Years of Education: N/A   Occupational History  . [employer] secretary   Social History Main Topics  . Smoking status: Never Smoker   . Smokeless tobacco: Never Used  . Alcohol Use: 1.2 oz/week    2 Glasses of wine per week     Comment: wine   .  Drug Use: No  . Sexual Activity: No   Other Topics Concern  . None   Social History Narrative   Pt is single and lives with 65 year old son.   No Known Allergies Family History  Problem Relation Age of Onset  . Osteoarthritis Mother   . Hypertension Mother   . Diabetes Father   . Hypertension Sister   . Heart attack Brother   . Heart attack Sister   . Allergies Mother   . Allergies Son   . Allergies Sister   . Asthma Son   . Clotting disorder Sister   . Rheum arthritis Mother     Past medical history, social, surgical and family history all reviewed in electronic medical record.  No pertanent information unless stated regarding to the chief complaint.   Review of Systems: No headache, visual changes, nausea, vomiting, diarrhea, constipation, dizziness, abdominal pain, skin rash, fevers, chills, night sweats, weight loss, swollen lymph nodes, body aches, joint swelling, muscle aches, chest pain, shortness of breath, mood changes.   Objective Blood pressure 126/82, pulse 56, weight 254 lb (115.214 kg), last menstrual period 12/15/2015.  General: No apparent distress alert and oriented x3 mood and affect  normal, dressed appropriately.  HEENT: Pupils equal, extraocular movements intact  Respiratory: Patient's speak in full sentences and does not appear short of breath  Cardiovascular: No lower extremity edema, non tender, no erythema  Skin: Warm dry intact with no signs of infection or rash on extremities or on axial skeleton.  Abdomen: Soft nontender  Neuro: Cranial nerves II through XII are intact, neurovascularly intact in all extremities with 2+ DTRs and 2+ pulses.  Lymph: No lymphadenopathy of posterior or anterior cervical chain or axillae bilaterally.  Gait normal with good balance and coordination.  MSK:  Non tender with full range of motion and good stability and symmetric strength and tone of shoulders, elbows, wrist, hip, knee and ankles bilaterally. Hand exam shows  the patient does have a trigger nodule over the IP joint of the first metacarpal. Tender to palpation. Does have triggering noted.  Procedure: Real-time Ultrasound Guided Injection of right first trigger nodule within the tendon sheath Device: GE Logiq E  Ultrasound guided injection is preferred based studies that show increased duration, increased effect, greater accuracy, decreased procedural pain, increased response rate, and decreased cost with ultrasound guided versus blind injection.  Verbal informed consent obtained.  Time-out conducted.  Noted no overlying erythema, induration, or other signs of local infection.  Skin prepped in a sterile fashion.  Local anesthesia: Topical Ethyl chloride.  With sterile technique and under real time ultrasound guidance:  With a 25-gauge half-inch needle patient was injected with a total of 0.5 mL of 0.5% Marcaine and 0.5 mL of Kenalog 40 mg/dL. Completed without difficulty  Pain immediately resolved suggesting accurate placement of the medication.  Advised to call if fevers/chills, erythema, induration, drainage, or persistent bleeding.  Images permanently stored and available for review in the ultrasound unit.  Impression: Technically successful ultrasound guided injection.    Impression and Recommendations:     This case required medical decision making of moderate complexity.      Note: This dictation was prepared with Dragon dictation along with smaller phrase technology. Any transcriptional errors that result from this process are unintentional.

## 2016-01-07 ENCOUNTER — Telehealth: Payer: Self-pay | Admitting: *Deleted

## 2016-01-07 NOTE — Telephone Encounter (Signed)
Pt called and stated that she is trying to get in touch with Dr. Elly Modena regarding surgery. Patient is scheduled to come in on Monday to followup with Dr. Elly Modena. Advised patient that this would be a great time to bring all of her questions to Dr. Elly Modena. Patient agreeable to this and had no further questions.

## 2016-01-13 ENCOUNTER — Ambulatory Visit (INDEPENDENT_AMBULATORY_CARE_PROVIDER_SITE_OTHER): Payer: Managed Care, Other (non HMO) | Admitting: Obstetrics and Gynecology

## 2016-01-13 ENCOUNTER — Encounter: Payer: Self-pay | Admitting: Obstetrics and Gynecology

## 2016-01-13 VITALS — BP 139/65 | HR 69 | Wt 254.0 lb

## 2016-01-13 DIAGNOSIS — Z9889 Other specified postprocedural states: Secondary | ICD-10-CM

## 2016-01-13 NOTE — Progress Notes (Signed)
Patient ID: Barbara Reese, female   DOB: 1966-08-08, 50 y.o.   MRN: [id number] 50 yo P2 presenting today for post op check. Patient underwent a D&C hysteroscopy for the evaluation of DUB. She reports onset of vaginal bleeding on 5/19 which appears to be her period but is much lighter in blood flow. She reports a lot of cramping pain though. Patient denies fever or abnormal discharge.  Past Medical History  Diagnosis Date  . Anemia   . Allergic rhinitis   . Insomnia   . GERD (gastroesophageal reflux disease)   . 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 Hypertension   . SVD (spontaneous vaginal delivery)     x 1  . OSA (obstructive sleep apnea)     Does not use CPAP.  Marland Kitchen Anxiety   . Depressive disorder     Hx  . Osteoarthritis     knees  . IBS (irritable bowel syndrome)     diet controlled, no meds  . PONV (postoperative nausea and vomiting)   . 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 Cholecystectomy  1987  . Knee surgery  2004    left knee- Dr. Percell Miller  . Upper gi endoscopy      gerd  . Hysteroscopy w/d&c N/A 12/30/2015    Procedure: DILATATION AND CURETTAGE /HYSTEROSCOPY;  Surgeon: Mora Bellman, MD;  Location: [facility];  Service: Gynecology;  Laterality: N/A;   Family History  Problem Relation Age of Onset  . Osteoarthritis Mother   . Hypertension Mother   . Diabetes Father   . Hypertension Sister   . Heart attack Brother   . Heart attack Sister   . Allergies Mother   . Allergies Son   . Allergies Sister   . Asthma Son   . Clotting disorder Sister   . Rheum arthritis Mother    Social History  Substance Use Topics  . Smoking status: Never Smoker   . Smokeless tobacco: Never Used  . Alcohol Use: 1.2 oz/week    2 Glasses of wine per week     Comment: wine    ROS See pertinent in HPI  Blood pressure 139/65, pulse 69,  weight 254 lb (115.214 kg), last menstrual period 01/10/2016. GENERAL: Well-developed, well-nourished female in no acute distress.  ABDOMEN: Soft, nontender, nondistended.  EXTREMITIES: No cyanosis, clubbing, or edema, 2+ distal pulses.   5/8 pathology Endometrium, curettage - DISORDERED PROLIFERATIVE ENDOMETRIUM WITH FOCAL ATYPICAL HYPERPLASIA.   A/P 50 yo here for post op check - Results of the pathology were reviewed again with the patient. I had previously informed her over the phone - Patient did not take megace.  - Answered all questions regarding pathology, and treatment options - Patient decided to move surgery sooner rather than later and would like it to be scheduled after 6/21 which corresponds to her son's birthday - patient will be contacted with surgery date and time

## 2016-01-27 ENCOUNTER — Encounter: Payer: Self-pay | Admitting: Obstetrics & Gynecology

## 2016-01-27 ENCOUNTER — Ambulatory Visit (INDEPENDENT_AMBULATORY_CARE_PROVIDER_SITE_OTHER): Payer: Managed Care, Other (non HMO) | Admitting: Obstetrics & Gynecology

## 2016-01-27 VITALS — BP 141/85 | HR 76 | Wt 252.0 lb

## 2016-01-27 DIAGNOSIS — N8502 Endometrial intraepithelial neoplasia [EIN]: Secondary | ICD-10-CM

## 2016-01-27 NOTE — Patient Instructions (Signed)
Total Laparoscopic Hysterectomy, Care After Refer to this sheet in the next few weeks. These instructions provide you with information on caring for yourself after your procedure. Your health care provider may also give you more specific instructions. Your treatment has been planned according to current medical practices, but problems sometimes occur. Call your health care provider if you have any problems or questions after your procedure. WHAT TO EXPECT AFTER THE PROCEDURE  Pain and bruising at the incision sites. You will be given pain medicine to control it.  Menopausal symptoms such as hot flashes, night sweats, and insomnia if your ovaries were removed.  Sore throat from the breathing tube that was inserted during surgery. HOME CARE INSTRUCTIONS  Only take over-the-counter or prescription medicines for pain, discomfort, or fever as directed by your health care provider.   Do not take aspirin. It can cause bleeding.   Do not drive when taking pain medicine.   Follow your health care provider's advice regarding diet, exercise, lifting, driving, and general activities.   Resume your usual diet as directed and allowed.   Get plenty of rest and sleep.   Do not douche, use tampons, or have sexual intercourse for at least 6 weeks, or until your health care provider gives you permission.   Change your bandages (dressings) as directed by your health care provider.   Monitor your temperature and notify your health care provider of a fever.   Take showers instead of baths for 2-3 weeks.   Do not drink alcohol until your health care provider gives you permission.   If you develop constipation, you may take a mild laxative with your health care provider's permission. Bran foods may help with constipation problems. Drinking enough fluids to keep your urine clear or pale yellow may help as well.   Try to have someone home with you for 1-2 weeks to help around the house.    Keep all of your follow-up appointments as directed by your health care provider.  SEEK MEDICAL CARE IF:  You have swelling, redness, or increasing pain around your incision sites.   You have pus coming from your incision.   You notice a bad smell coming from your incision.   Your incision breaks open.   You feel dizzy or lightheaded.   You have pain or bleeding when you urinate.   You have persistent diarrhea.   You have persistent nausea and vomiting.   You have abnormal vaginal discharge.   You have a rash.   You have any type of abnormal reaction or develop an allergy to your medicine.   You have poor pain control with your prescribed medicine.  SEEK IMMEDIATE MEDICAL CARE IF:  You have chest pain or shortness of breath.  You have severe abdominal pain that is not relieved with pain medicine.  You have pain or swelling in your legs. MAKE SURE YOU:  Understand these instructions.  Will watch your condition.  Will get help right away if you are not doing well or get worse.   This information is not intended to replace advice given to you by your health care provider. Make sure you discuss any questions you have with your health care provider.   Document Released: 05/31/2013 Document Revised: 08/15/2013 Document Reviewed: 05/31/2013 Elsevier Interactive Patient Education 2016 [employer] Total Laparoscopic Hysterectomy A total laparoscopic hysterectomy is a minimally invasive surgery to remove your uterus and cervix. This surgery is performed by making several small cuts (incisions) in your abdomen.  It can also be done with a thin, lighted tube (laparoscope) inserted into two small incisions in your lower abdomen. Your fallopian tubes and ovaries can be removed (bilateral salpingo-oophorectomy) during this surgery as well.Benefits of minimally invasive surgery include:  Less pain.  Less risk of blood loss.  Less risk of infection.  Quicker  return to normal activities. LET [facility] CARE PROVIDER KNOW ABOUT:  Any allergies you have.  All medicines you are taking, including vitamins, herbs, eye drops, creams, and over-the-counter medicines.  Previous problems you or members of your family have had with the use of anesthetics.  Any blood disorders you have.  Previous surgeries you have had.  Medical conditions you have. RISKS AND COMPLICATIONS  Generally, this is a safe procedure. However, as with any procedure, complications can occur. Possible complications include:  Bleeding.  Blood clots in the legs or lung.  Infection.  Injury to surrounding organs.  Problems with anesthesia.  Early menopause symptoms (hot flashes, night sweats, insomnia).  Risk of conversion to an open abdominal incision. BEFORE THE PROCEDURE  Ask your health care provider about changing or stopping your regular medicines.  Do not take aspirin or blood thinners (anticoagulants) for 1 week before the surgery or as told by your health care provider.  Do not eat or drink anything for 8 hours before the surgery or as told by your health care provider.  Quit smoking if you smoke.  Arrange for a ride home after surgery and for someone to help you at home during recovery. PROCEDURE   You will be given antibiotic medicine.  An IV tube will be placed in your arm. You will be given medicine to make you sleep (general anesthetic).  A gas (carbon dioxide) will be used to inflate your abdomen. This will allow your surgeon to look inside your abdomen, perform your surgery, and treat any other problems found if necessary.  Three or four small incisions (often less than 1/2 inch) will be made in your abdomen. One of these incisions will be made in the area of your belly button (navel). The laparoscope will be inserted into the incision. Your surgeon will look through the laparoscope while doing your procedure.  Other surgical instruments will be  inserted through the other incisions.  Your uterus may be removed through your vagina or cut into small pieces and removed through the small incisions.  Your incisions will be closed. AFTER THE PROCEDURE  The gas will be released from inside your abdomen.  You will be taken to the recovery area where a nurse will watch and check your progress. Once you are awake, stable, and taking fluids well, without other problems, you will return to your room or be allowed to go home.  There is usually minimal discomfort following the surgery because the incisions are so small.  You will be given pain medicine while you are in the hospital and for when you go home.   This information is not intended to replace advice given to you by your health care provider. Make sure you discuss any questions you have with your health care provider.   Document Released: 06/07/2007 Document Revised: 04/12/2013 Document Reviewed: 02/28/2013 Elsevier Interactive Patient Education [employer].

## 2016-01-27 NOTE — Progress Notes (Signed)
Patient ID: Barbara Reese, female   DOB: 05/19/1966, 50 y.o.   MRN: [id number] History:  50 y.o. No obstetric history on file. here today for AUB.  Pt has had a full eval by Dr. 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   Here or consult for robot assisted total laparoscopic hyeteectomy  The following portions of the patient's history were reviewed and updated as appropriate: allergies, current medications, past family history, past medical history, past social history, past surgical history and problem list.  Review of Systems:  Pertinent items are noted in HPI.  Objective:  Physical Exam Blood pressure 141/85, pulse 76, weight 252 lb (114.306 kg), last menstrual period 01/10/2016. Gen: NAD Abd: Soft, nontender and nondistended; obese Pelvic: Normal appearing external genitalia; normal appearing vaginal mucosa and cervix.  Normal discharge.  Uterus 14-16 weeks size- exam limited by pts body habitus, no other palpable masses, no uterine or adnexal tenderness appreciated Ext: no edema  Labs and Imaging [place] Ext[facility] Up [facility]  01/07/2016  Procedure: Real-time Ultrasound Guided Injection of right first trigger nodule within the tendon sheath Device: GE Logiq E  Ultrasound guided injection is preferred based studies that show increased duration, increased effect, greater accuracy, decreased procedural pain, increased response rate, and decreased cost with ultrasound guided versus blind injection.  Verbal informed consent obtained.  Time-out conducted.  Noted no overlying erythema, induration, or other signs of local infection.  Skin prepped in a sterile fashion.  Local anesthesia: Topical Ethyl chloride.  With sterile technique and under real time ultrasound guidance: With a 25-gauge half-inch needle patient was injected with a total of 0.5 mL of 0.5% Marcaine and 0.5 mL of Kenalog 40 mg/dL. Completed without difficulty  Pain immediately resolved  suggesting accurate placement of the medication.  Advised to call if fevers/chills, erythema, induration, drainage, or persistent bleeding.  Images permanently stored and available for review in the ultrasound unit.  Impression: Technically successful ultrasound guided injection.  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  08/12/2015 CLINICAL DATA: Irregular abnormal menstrual bleeding, Interperiod Bleeding, history of fibroids, prior endometrial biopsy.  EXAM: TRANSABDOMINAL AND TRANSVAGINAL ULTRASOUND OF PELVIS  TECHNIQUE: Both transabdominal and transvaginal ultrasound examinations of the pelvis were performed. Transabdominal technique was performed for global imaging of the pelvis including uterus, ovaries, adnexal regions, and pelvic cul-de-sac. It was necessary to proceed with endovaginal exam following the transabdominal exam to visualize the uterus and adnexae and better detail.  COMPARISON: 11/18/2004  FINDINGS: Uterus  Measurements: 14.8 x 6.3 x 8.5 cm. Diffusely heterogeneous and enlarged with multiple fibroids evident. Dominant posterior fundal fibroid measures 7 x 5 x 6.6 cm. At least 4 other fibroids demonstrated throughout the uterine body. Anterior fundal fibroid measures 2.3 cm. Anterior body uterine fibroid measures 3.6 cm. Posterior uterine body fibroid measures 2.5 cm. These have enlarged compared to 11/18/2004.  Endometrium  Thickness: 15.3 mm. Endometrium appears echogenic, thickened and heterogeneous. Small echogenic endometrial polyp measures 10 mm by ultrasound. Consider further evaluation with sonohysterogram.  Right ovary  Measurements: 2.8 x 2.2 x 1.9 cm. Normal appearance/no adnexal mass.  Left ovary  Not visualized secondary to obscuring bowel gas  Other findings  No abnormal free fluid.  IMPRESSION: Enlarged fibroid uterus now measuring 14.8  cm in length. Fibroids have enlarged compared to 2006.  Thickened heterogeneous endometrium with a small echogenic measurable 10 mm endometrial polyp, nonspecific. Consider further evaluation with sonohysterogram versus repeat endometrial  sampling.  Normal right ovary  Nonvisualized left ovary because of bowel gas  No free fluid Assessment & Plan:  AUB with atypical hyperplasia of the endometrium  Patient desires surgical management with robot assisted total laparoscopic hysterectomy with bilateral salpingectomy pt undecided about oophorectomy.  She will decide by the day of surgery. I have reviewed the cons and benefits of leaving vs removing the ovaries..  The risks of surgery were discussed in detail with the patient including but not limited to: bleeding which may require transfusion or reoperation; infection which may require prolonged hospitalization or re-hospitalization and antibiotic therapy; injury to bowel, bladder, ureters and major vessels or other surrounding organs; need for additional procedures including laparotomy; thromboembolic phenomenon, incisional problems and other postoperative or anesthesia complications.  Patient was told that the likelihood that her condition and symptoms will be treated effectively with this surgical management was very high; the postoperative expectations were also discussed in detail. The patient also understands the alternative treatment options which were discussed in full. All questions were answered.  She was told that she will be contacted by our surgical scheduler regarding the time and date of her surgery; routine preoperative instructions of having nothing to eat or drink after midnight on the day prior to surgery and also coming to the hospital 1 1/2 hours prior to her time of surgery were also emphasized.  She was told she may be called for a preoperative appointment about a week prior to surgery and will be given further preoperative  instructions at that visit. Printed patient education handouts about the procedure were given to the patient to review at home.  Satin Boal L. Harraway-Smith, M.D., [name]

## 2016-02-03 ENCOUNTER — Encounter: Payer: Self-pay | Admitting: Family Medicine

## 2016-02-03 ENCOUNTER — Ambulatory Visit (INDEPENDENT_AMBULATORY_CARE_PROVIDER_SITE_OTHER): Payer: Managed Care, Other (non HMO) | Admitting: Family Medicine

## 2016-02-03 VITALS — BP 160/90 | HR 77 | Temp 97.9°F | Ht 66.0 in | Wt 252.8 lb

## 2016-02-03 DIAGNOSIS — M170 Bilateral primary osteoarthritis of knee: Secondary | ICD-10-CM

## 2016-02-03 DIAGNOSIS — L430 Hypertrophic lichen planus: Secondary | ICD-10-CM | POA: Diagnosis not present

## 2016-02-03 DIAGNOSIS — I10 Essential (primary) hypertension: Secondary | ICD-10-CM | POA: Diagnosis not present

## 2016-02-03 MED ORDER — HYDROCHLOROTHIAZIDE 25 MG PO TABS: 25.0000 mg | ORAL_TABLET | Freq: Every day | ORAL | Status: DC

## 2016-02-03 MED ORDER — CLOBETASOL PROPIONATE 0.05 % EX OINT: TOPICAL_OINTMENT | Freq: Every day | CUTANEOUS | Status: DC | PRN

## 2016-02-03 MED ORDER — TRAMADOL HCL 50 MG PO TABS: 50.0000 mg | ORAL_TABLET | Freq: Four times a day (QID) | ORAL | Status: DC | PRN

## 2016-02-03 NOTE — Patient Instructions (Signed)
It was great to see you again today!  Completed FMLA paperwork Refilled medications: tramadol, clobetasol  Increase HCTZ to 25mg  daily. They'll recheck blood pressure and labs at your preop appointment.  Good luck with your surgery. I will be thinking about you!  Be well, Dr. [name]

## 2016-02-03 NOTE — Progress Notes (Signed)
Date of Visit: 02/03/2016   HPI:  Patient presents for completion of FMLA forms for her knee arthritis. Usually misses 1-3 days of work per month when knee pain flares. Continues to follow up with sports medicine. Needs refill on tramadol.  Of note she is having a hysterectomy soon for atypical endometrial cells found on D&C. She is nervous about the surgery and specifically is having trouble deciding whether she wants to keep her ovaries. Has been offered BSO in addition to the hysterectomy.  Needs refill on clobetasol - uses just as needed on specific skin spots that itch.  Hypertension - taking HCTZ 12.5mg  daily. No chest pain or shortness of breath.   ROS: See HPI.  [place]: history of DUB/endometrial hyperplasia with atypia, degenerative knee arthritis, GERD, hypertension, depression, anxiety  PHYSICAL EXAM: BP 153/89 mmHg  Pulse 77  Temp(Src) 97.9 F (36.6 C) (Oral)  Ht 5\' 6"  (1.676 m)  Wt 252 lb 12.8 oz (114.669 kg)  BMI 40.82 kg/m2  LMP 01/10/2016 (Exact Date) Gen: NAD, pleasant, cooperative HEENT: normocephalic, atraumatic  Heart: regular rate and rhythm, no murmur Lungs: clear to auscultation bilaterally, normal work of breathing  Neuro: alert, grossly nonfocal speech normal Ext: bilateral knees without effusions or deformities. Some crepitus on extension/flexion. No laxity of MCL or LCL bilaterally  ASSESSMENT/PLAN:  Hypertension Blood pressure above goal including on recheck Increase HCTZ to 25mg  daily Discussed risk of gout flare with HCTZ She will have blood pressure & labs checked at next week's preop visit  Arthritis of knee, degenerative Chronic and stable. FMLA paperwork completed today Refill tramadol.  Lichen planus hypertrophicus Refill clobetasol, discussed using sparingly due to its high potency.    [place]. [name], [place]

## 2016-02-06 NOTE — Assessment & Plan Note (Signed)
Refill clobetasol, discussed using sparingly due to its high potency.

## 2016-02-06 NOTE — Patient Instructions (Addendum)
Your procedure is scheduled on:  Tuesday, June 27  Enter through the Main Entrance of [facility] at: 6 am  Pick up the phone at the desk and dial [phone].  Call this number if you have problems the morning of surgery: [phone].  Remember: Do NOT eat or drink after midnight Monday, 6/26  Take these medicines the morning of surgery with a SIP OF WATER: BP med and Protonix  Do NOT wear jewelry (body piercing), metal hair clips/bobby pins, make-up, or nail polish. Do NOT wear lotions, powders, or perfumes.  You may wear deoderant. Do NOT shave for 48 hours prior to surgery. Do NOT bring valuables to the hospital. Contacts, dentures, or bridgework may not be worn into surgery.  Leave suitcase in car.  After surgery it may be brought to your room.  For patients admitted to the hospital, checkout time is 11:00 AM the day of discharge. Have a responsible adult drive you home and stay with you for 24 hours after your procedure

## 2016-02-06 NOTE — Assessment & Plan Note (Signed)
Chronic and stable. FMLA paperwork completed today Refill tramadol.

## 2016-02-06 NOTE — Assessment & Plan Note (Signed)
Blood pressure above goal including on recheck Increase HCTZ to 25mg  daily Discussed risk of gout flare with HCTZ She will have blood pressure & labs checked at next week's preop visit

## 2016-02-10 ENCOUNTER — Encounter (HOSPITAL_COMMUNITY): Payer: Self-pay

## 2016-02-10 ENCOUNTER — Encounter (HOSPITAL_COMMUNITY)
Admission: RE | Admit: 2016-02-10 | Discharge: 2016-02-10 | Disposition: A | Discharge status: Home / Self Care | Payer: Managed Care, Other (non HMO) | Source: Ambulatory Visit | Attending: Obstetrics & Gynecology | Admitting: Obstetrics & Gynecology

## 2016-02-10 DIAGNOSIS — Z01812 Encounter for preprocedural laboratory examination: Secondary | ICD-10-CM | POA: Insufficient documentation

## 2016-02-10 HISTORY — DX: Inflammatory liver disease, unspecified: K75.9

## 2016-02-10 LAB — BASIC METABOLIC PANEL
ANION GAP: 7 (ref 5–15)
BUN: 15 mg/dL (ref 6–20)
CALCIUM: 9.8 mg/dL (ref 8.9–10.3)
CO2: 29 mmol/L (ref 22–32)
CREATININE: 0.92 mg/dL (ref 0.44–1.00)
Chloride: 101 mmol/L (ref 101–111)
GFR calc Af Amer: >60 mL/min (ref >60)
GLUCOSE: 119 mg/dL — AB (ref 65–99)
Potassium: 3.6 mmol/L (ref 3.5–5.1)
Sodium: 137 mmol/L (ref 135–145)

## 2016-02-10 LAB — CBC
HCT: 36.2 % (ref 36.0–46.0)
Hemoglobin: 11.4 g/dL — ABNORMAL LOW (ref 12.0–15.0)
MCH: 24.5 pg — AB (ref 26.0–34.0)
MCHC: 31.5 g/dL (ref 30.0–36.0)
MCV: 77.7 fL — ABNORMAL LOW (ref 78.0–100.0)
Platelets: 343 10*3/uL (ref 150–400)
RBC: 4.66 MIL/uL (ref 3.87–5.11)
RDW: 18.1 % — AB (ref 11.5–15.5)
WBC: 6.3 10*3/uL (ref 4.0–10.5)

## 2016-02-15 ENCOUNTER — Encounter: Payer: Self-pay | Admitting: *Deleted

## 2016-02-15 NOTE — Progress Notes (Signed)
FMLA/Disability Forms completed and faxed with medical records to [facility]  Will have someone contact patient to come by and pick up her copy.

## 2016-02-17 NOTE — Anesthesia Preprocedure Evaluation (Signed)
Anesthesia Evaluation  Patient identified by MRN, date of birth, ID band Patient awake    Reviewed: Allergy & Precautions, NPO status , Patient's Chart, lab work & pertinent test results  History of Anesthesia Complications (+) PONV and history of anesthetic complications  Airway Mallampati: II  TM Distance: >3 FB Neck ROM: Full    Dental no notable dental hx. (+) Dental Advisory Given, Teeth Intact   Pulmonary shortness of breath and with exertion, sleep apnea ,    Pulmonary exam normal breath sounds clear to auscultation       Cardiovascular hypertension, Pt. on medications Normal cardiovascular exam Rhythm:Regular Rate:Normal     Neuro/Psych PSYCHIATRIC DISORDERS Anxiety Depression negative neurological ROS     GI/Hepatic GERD  Medicated,(+) Hepatitis -, Unspecified  Endo/Other  Morbid obesityprediabetes  Renal/GU negative Renal ROS  negative genitourinary   Musculoskeletal  (+) Arthritis ,   Abdominal (+) + obese,   Peds negative pediatric ROS (+)  Hematology  (+) anemia ,   Anesthesia Other Findings   Reproductive/Obstetrics negative OB ROS                             Anesthesia Physical Anesthesia Plan  ASA: III  Anesthesia Plan: General   Post-op Pain Management:    Induction: Intravenous  Airway Management Planned: Oral ETT  Additional Equipment:   Intra-op Plan:   Post-operative Plan: Extubation in OR  Informed Consent:   Plan Discussed with: Surgeon  Anesthesia Plan Comments:         Anesthesia Quick Evaluation

## 2016-02-18 ENCOUNTER — Ambulatory Visit (HOSPITAL_COMMUNITY): Payer: Managed Care, Other (non HMO) | Admitting: Anesthesiology

## 2016-02-18 ENCOUNTER — Encounter (HOSPITAL_COMMUNITY): Payer: Self-pay | Admitting: *Deleted

## 2016-02-18 ENCOUNTER — Encounter (HOSPITAL_COMMUNITY)
Admission: RE | Disposition: A | Discharge status: Home / Self Care | Payer: Self-pay | Source: Ambulatory Visit | Attending: Obstetrics & Gynecology

## 2016-02-18 ENCOUNTER — Observation Stay (HOSPITAL_COMMUNITY)
Admission: RE | Admit: 2016-02-18 | Discharge: 2016-02-19 | Disposition: A | Discharge status: Home / Self Care | Payer: Managed Care, Other (non HMO) | Source: Ambulatory Visit | Attending: Obstetrics & Gynecology | Admitting: Obstetrics & Gynecology

## 2016-02-18 DIAGNOSIS — D259 Leiomyoma of uterus, unspecified: Secondary | ICD-10-CM | POA: Diagnosis not present

## 2016-02-18 DIAGNOSIS — D252 Subserosal leiomyoma of uterus: Secondary | ICD-10-CM | POA: Insufficient documentation

## 2016-02-18 DIAGNOSIS — N840 Polyp of corpus uteri: Secondary | ICD-10-CM | POA: Insufficient documentation

## 2016-02-18 DIAGNOSIS — K589 Irritable bowel syndrome without diarrhea: Secondary | ICD-10-CM | POA: Insufficient documentation

## 2016-02-18 DIAGNOSIS — N838 Other noninflammatory disorders of ovary, fallopian tube and broad ligament: Secondary | ICD-10-CM | POA: Insufficient documentation

## 2016-02-18 DIAGNOSIS — N800 Endometriosis of uterus: Secondary | ICD-10-CM | POA: Insufficient documentation

## 2016-02-18 DIAGNOSIS — I10 Essential (primary) hypertension: Secondary | ICD-10-CM | POA: Insufficient documentation

## 2016-02-18 DIAGNOSIS — N938 Other specified abnormal uterine and vaginal bleeding: Secondary | ICD-10-CM | POA: Diagnosis not present

## 2016-02-18 DIAGNOSIS — Z6841 Body Mass Index (BMI) 40.0 and over, adult: Secondary | ICD-10-CM | POA: Diagnosis not present

## 2016-02-18 DIAGNOSIS — N8502 Endometrial intraepithelial neoplasia [EIN]: Secondary | ICD-10-CM | POA: Diagnosis present

## 2016-02-18 DIAGNOSIS — D219 Benign neoplasm of connective and other soft tissue, unspecified: Secondary | ICD-10-CM

## 2016-02-18 DIAGNOSIS — D251 Intramural leiomyoma of uterus: Principal | ICD-10-CM | POA: Insufficient documentation

## 2016-02-18 DIAGNOSIS — G4733 Obstructive sleep apnea (adult) (pediatric): Secondary | ICD-10-CM | POA: Insufficient documentation

## 2016-02-18 DIAGNOSIS — K219 Gastro-esophageal reflux disease without esophagitis: Secondary | ICD-10-CM | POA: Diagnosis not present

## 2016-02-18 DIAGNOSIS — D649 Anemia, unspecified: Secondary | ICD-10-CM | POA: Diagnosis not present

## 2016-02-18 DIAGNOSIS — E669 Obesity, unspecified: Secondary | ICD-10-CM | POA: Diagnosis present

## 2016-02-18 DIAGNOSIS — Z9889 Other specified postprocedural states: Secondary | ICD-10-CM

## 2016-02-18 HISTORY — PX: ROBOTIC ASSISTED TOTAL HYSTERECTOMY WITH SALPINGECTOMY: SHX6679

## 2016-02-18 HISTORY — PX: CYSTOSCOPY: SHX5120

## 2016-02-18 LAB — PREGNANCY, URINE: Preg Test, Ur: NEGATIVE

## 2016-02-18 SURGERY — ROBOTIC ASSISTED TOTAL HYSTERECTOMY WITH SALPINGECTOMY
Anesthesia: General | Site: Urethra

## 2016-02-18 MED ORDER — POLYETHYLENE GLYCOL 3350 17 G PO PACK
17.0000 g | PACK | Freq: Every day | ORAL | Status: DC | PRN
  Filled 2016-02-18: qty 1

## 2016-02-18 MED ORDER — BUPIVACAINE HCL (PF) 0.5 % IJ SOLN
INTRAMUSCULAR | Status: AC
  Filled 2016-02-18: qty 30

## 2016-02-18 MED ORDER — ONDANSETRON HCL 4 MG/2ML IJ SOLN
INTRAMUSCULAR | Status: AC
  Filled 2016-02-18: qty 2

## 2016-02-18 MED ORDER — PHENYLEPHRINE HCL 10 MG/ML IJ SOLN
INTRAMUSCULAR | Status: DC | PRN
  Administered 2016-02-18: 120 ug via INTRAVENOUS
  Administered 2016-02-18: 80 ug via INTRAVENOUS

## 2016-02-18 MED ORDER — METHYLENE BLUE 0.5 % INJ SOLN: INTRAVENOUS | Status: DC | PRN

## 2016-02-18 MED ORDER — ACETAMINOPHEN 10 MG/ML IV SOLN
1000.0000 mg | Freq: Once | INTRAVENOUS | Status: AC
  Administered 2016-02-18: 1000 mg via INTRAVENOUS
  Filled 2016-02-18: qty 100

## 2016-02-18 MED ORDER — HYDROMORPHONE HCL 1 MG/ML IJ SOLN
INTRAMUSCULAR | Status: AC
  Administered 2016-02-18: 0.5 mg via INTRAVENOUS
  Filled 2016-02-18: qty 1

## 2016-02-18 MED ORDER — MIDAZOLAM HCL 2 MG/2ML IJ SOLN
INTRAMUSCULAR | Status: DC | PRN
  Administered 2016-02-18: 1 mg via INTRAVENOUS

## 2016-02-18 MED ORDER — ONDANSETRON HCL 4 MG PO TABS: 4.0000 mg | ORAL_TABLET | Freq: Four times a day (QID) | ORAL | Status: DC | PRN

## 2016-02-18 MED ORDER — ZOLPIDEM TARTRATE 5 MG PO TABS
5.0000 mg | ORAL_TABLET | Freq: Every evening | ORAL | Status: DC | PRN
  Administered 2016-02-18: 5 mg via ORAL
  Filled 2016-02-18: qty 1

## 2016-02-18 MED ORDER — SCOPOLAMINE 1 MG/3DAYS TD PT72
MEDICATED_PATCH | TRANSDERMAL | Status: DC
Start: 2016-02-18 — End: 2016-02-19
  Administered 2016-02-18: 1.5 mg via TRANSDERMAL
  Filled 2016-02-18: qty 1

## 2016-02-18 MED ORDER — ROCURONIUM BROMIDE 100 MG/10ML IV SOLN
INTRAVENOUS | Status: AC
  Filled 2016-02-18: qty 1

## 2016-02-18 MED ORDER — OXYCODONE-ACETAMINOPHEN 5-325 MG PO TABS
1.0000 | ORAL_TABLET | ORAL | Status: DC | PRN
  Administered 2016-02-18 – 2016-02-19 (×3): 1 via ORAL
  Filled 2016-02-18: qty 2
  Filled 2016-02-18 (×2): qty 1
  Filled 2016-02-18: qty 2
  Filled 2016-02-18: qty 1

## 2016-02-18 MED ORDER — ONDANSETRON HCL 4 MG/2ML IJ SOLN
INTRAMUSCULAR | Status: DC | PRN
Start: 2016-02-18 — End: 2016-02-18
  Administered 2016-02-18: 4 mg via INTRAVENOUS

## 2016-02-18 MED ORDER — SUGAMMADEX SODIUM 500 MG/5ML IV SOLN
INTRAVENOUS | Status: AC
  Filled 2016-02-18: qty 5

## 2016-02-18 MED ORDER — HYDROCHLOROTHIAZIDE 25 MG PO TABS
25.0000 mg | ORAL_TABLET | Freq: Every day | ORAL | Status: DC
  Administered 2016-02-19: 25 mg via ORAL
  Filled 2016-02-18 (×2): qty 1

## 2016-02-18 MED ORDER — PANTOPRAZOLE SODIUM 40 MG PO TBEC
40.0000 mg | DELAYED_RELEASE_TABLET | Freq: Every day | ORAL | Status: DC
  Administered 2016-02-19: 40 mg via ORAL
  Filled 2016-02-18 (×2): qty 1

## 2016-02-18 MED ORDER — LIDOCAINE HCL (CARDIAC) 20 MG/ML IV SOLN
INTRAVENOUS | Status: DC | PRN
  Administered 2016-02-18: 50 mg via INTRAVENOUS
  Administered 2016-02-18: 20 mg via INTRAVENOUS
  Administered 2016-02-18: 30 mg via INTRAVENOUS

## 2016-02-18 MED ORDER — KCL IN DEXTROSE-NACL 40-5-0.45 MEQ/L-%-% IV SOLN
INTRAVENOUS | Status: AC
  Administered 2016-02-18: 14:00:00 via INTRAVENOUS
  Filled 2016-02-18 (×4): qty 1000

## 2016-02-18 MED ORDER — ROCURONIUM BROMIDE 100 MG/10ML IV SOLN
INTRAVENOUS | Status: DC | PRN
  Administered 2016-02-18: 20 mg via INTRAVENOUS
  Administered 2016-02-18: 10 mg via INTRAVENOUS
  Administered 2016-02-18: 20 mg via INTRAVENOUS
  Administered 2016-02-18: 50 mg via INTRAVENOUS

## 2016-02-18 MED ORDER — SIMETHICONE 80 MG PO CHEW
80.0000 mg | CHEWABLE_TABLET | Freq: Four times a day (QID) | ORAL | Status: DC | PRN
  Filled 2016-02-18: qty 1

## 2016-02-18 MED ORDER — KETOROLAC TROMETHAMINE 30 MG/ML IJ SOLN
INTRAMUSCULAR | Status: AC
  Filled 2016-02-18: qty 1

## 2016-02-18 MED ORDER — BUPIVACAINE HCL (PF) 0.5 % IJ SOLN
INTRAMUSCULAR | Status: DC | PRN
  Administered 2016-02-18: 30 mL

## 2016-02-18 MED ORDER — SCOPOLAMINE 1 MG/3DAYS TD PT72
1.0000 | MEDICATED_PATCH | Freq: Once | TRANSDERMAL | Status: DC
  Administered 2016-02-18: 1.5 mg via TRANSDERMAL

## 2016-02-18 MED ORDER — STERILE WATER FOR IRRIGATION IR SOLN
Status: DC | PRN
  Administered 2016-02-18: 1000 mL

## 2016-02-18 MED ORDER — PHENYLEPHRINE 40 MCG/ML (10ML) SYRINGE FOR IV PUSH (FOR BLOOD PRESSURE SUPPORT)
PREFILLED_SYRINGE | INTRAVENOUS | Status: AC
  Filled 2016-02-18: qty 10

## 2016-02-18 MED ORDER — CEFAZOLIN SODIUM-DEXTROSE 2-4 GM/100ML-% IV SOLN: 2.0000 g | Freq: Once | INTRAVENOUS | Status: DC

## 2016-02-18 MED ORDER — ONDANSETRON HCL 4 MG/2ML IJ SOLN
4.0000 mg | Freq: Four times a day (QID) | INTRAMUSCULAR | Status: DC | PRN
  Administered 2016-02-18: 4 mg via INTRAVENOUS
  Filled 2016-02-18: qty 2

## 2016-02-18 MED ORDER — HYDROMORPHONE HCL 1 MG/ML IJ SOLN
0.2000 mg | INTRAMUSCULAR | Status: DC | PRN
  Administered 2016-02-18: 0.6 mg via INTRAVENOUS
  Administered 2016-02-18: 0.5 mg via INTRAVENOUS
  Filled 2016-02-18 (×2): qty 1

## 2016-02-18 MED ORDER — HYDROMORPHONE HCL 1 MG/ML IJ SOLN
0.2500 mg | INTRAMUSCULAR | Status: DC | PRN
  Administered 2016-02-18: 0.5 mg via INTRAVENOUS
  Administered 2016-02-18 (×2): 0.25 mg via INTRAVENOUS

## 2016-02-18 MED ORDER — SUGAMMADEX SODIUM 200 MG/2ML IV SOLN
INTRAVENOUS | Status: DC | PRN
  Administered 2016-02-18: 456 mg via INTRAVENOUS

## 2016-02-18 MED ORDER — LACTATED RINGERS IV SOLN: INTRAVENOUS | Status: DC

## 2016-02-18 MED ORDER — KETOROLAC TROMETHAMINE 30 MG/ML IJ SOLN
30.0000 mg | Freq: Four times a day (QID) | INTRAMUSCULAR | Status: AC
  Administered 2016-02-18 – 2016-02-19 (×4): 30 mg via INTRAVENOUS
  Filled 2016-02-18 (×4): qty 1

## 2016-02-18 MED ORDER — LIDOCAINE HCL (CARDIAC) 20 MG/ML IV SOLN
INTRAVENOUS | Status: AC
  Filled 2016-02-18: qty 5

## 2016-02-18 MED ORDER — FENTANYL CITRATE (PF) 250 MCG/5ML IJ SOLN
INTRAMUSCULAR | Status: AC
  Filled 2016-02-18: qty 5

## 2016-02-18 MED ORDER — BISACODYL 10 MG RE SUPP
10.0000 mg | Freq: Every day | RECTAL | Status: DC | PRN
  Filled 2016-02-18: qty 1

## 2016-02-18 MED ORDER — LACTATED RINGERS IR SOLN
Status: DC | PRN
  Administered 2016-02-18: 3000 mL

## 2016-02-18 MED ORDER — METHYLENE BLUE 1 % INJ SOLN
INTRAMUSCULAR | Status: AC
  Filled 2016-02-18: qty 10

## 2016-02-18 MED ORDER — PROPOFOL 10 MG/ML IV BOLUS
INTRAVENOUS | Status: AC
  Filled 2016-02-18: qty 20

## 2016-02-18 MED ORDER — DOCUSATE SODIUM 100 MG PO CAPS
100.0000 mg | ORAL_CAPSULE | Freq: Two times a day (BID) | ORAL | Status: DC
  Administered 2016-02-18 – 2016-02-19 (×2): 100 mg via ORAL
  Filled 2016-02-18 (×6): qty 1

## 2016-02-18 MED ORDER — DEXAMETHASONE SODIUM PHOSPHATE 10 MG/ML IJ SOLN
INTRAMUSCULAR | Status: DC | PRN
  Administered 2016-02-18: 4 mg via INTRAVENOUS

## 2016-02-18 MED ORDER — PROPOFOL 10 MG/ML IV BOLUS
INTRAVENOUS | Status: DC | PRN
  Administered 2016-02-18: 180 mg via INTRAVENOUS
  Administered 2016-02-18: 20 mg via INTRAVENOUS

## 2016-02-18 MED ORDER — DEXAMETHASONE SODIUM PHOSPHATE 4 MG/ML IJ SOLN
INTRAMUSCULAR | Status: AC
  Filled 2016-02-18: qty 1

## 2016-02-18 MED ORDER — IBUPROFEN 600 MG PO TABS: 600.0000 mg | ORAL_TABLET | Freq: Four times a day (QID) | ORAL | Status: DC | PRN

## 2016-02-18 MED ORDER — LACTATED RINGERS IV SOLN
INTRAVENOUS | Status: DC
  Administered 2016-02-18: 07:00:00 via INTRAVENOUS

## 2016-02-18 MED ORDER — KETOROLAC TROMETHAMINE 30 MG/ML IJ SOLN: 30.0000 mg | Freq: Once | INTRAMUSCULAR | Status: DC

## 2016-02-18 MED ORDER — CEFAZOLIN SODIUM-DEXTROSE 2-4 GM/100ML-% IV SOLN
2.0000 g | INTRAVENOUS | Status: AC
  Administered 2016-02-18: 2 g via INTRAVENOUS

## 2016-02-18 MED ORDER — ACETAMINOPHEN 10 MG/ML IV SOLN: 1000.0000 mg | Freq: Once | INTRAVENOUS | Status: DC

## 2016-02-18 MED ORDER — LACTATED RINGERS IV SOLN
INTRAVENOUS | Status: DC
  Administered 2016-02-18 (×2): via INTRAVENOUS

## 2016-02-18 MED ORDER — METHYLENE BLUE 0.5 % INJ SOLN
INTRAVENOUS | Status: DC | PRN
  Administered 2016-02-18: 50 mg via INTRAVENOUS

## 2016-02-18 MED ORDER — MIDAZOLAM HCL 2 MG/2ML IJ SOLN
INTRAMUSCULAR | Status: AC
  Filled 2016-02-18: qty 2

## 2016-02-18 MED ORDER — KETOROLAC TROMETHAMINE 30 MG/ML IJ SOLN: 30.0000 mg | Freq: Four times a day (QID) | INTRAMUSCULAR | Status: AC

## 2016-02-18 MED ORDER — FENTANYL CITRATE (PF) 100 MCG/2ML IJ SOLN
INTRAMUSCULAR | Status: DC | PRN
  Administered 2016-02-18 (×4): 50 ug via INTRAVENOUS

## 2016-02-18 SURGICAL SUPPLY — 57 items
APPLICATOR ARISTA FLEXITIP XL (MISCELLANEOUS) ×4 IMPLANT
APPLIER CLIP 5 13 M/L LIGAMAX5 (MISCELLANEOUS)
BARRIER ADHS 3X4 INTERCEED (GAUZE/BANDAGES/DRESSINGS) IMPLANT
CATH FOLEY 3WAY  5CC 16FR (CATHETERS)
CATH FOLEY 3WAY 5CC 16FR (CATHETERS) IMPLANT
CLIP APPLIE 5 13 M/L LIGAMAX5 (MISCELLANEOUS) IMPLANT
CLOTH BEACON ORANGE TIMEOUT ST (SAFETY) ×4 IMPLANT
CONT PATH 16OZ SNAP LID 3702 (MISCELLANEOUS) ×4 IMPLANT
COVER BACK TABLE 60X90IN (DRAPES) ×8 IMPLANT
COVER TIP SHEARS 8 DVNC (MISCELLANEOUS) ×3 IMPLANT
COVER TIP SHEARS 8MM DA VINCI (MISCELLANEOUS) ×1
DECANTER SPIKE VIAL GLASS SM (MISCELLANEOUS) IMPLANT
DEFOGGER SCOPE WARMER CLEARIFY (MISCELLANEOUS) ×4 IMPLANT
DEVICE TROCAR PUNCTURE CLOSURE (ENDOMECHANICALS) IMPLANT
DRAPE ROBOTICS STRL (DRAPES) ×4 IMPLANT
DURAPREP 26ML APPLICATOR (WOUND CARE) ×4 IMPLANT
ELECT REM PT RETURN 9FT ADLT (ELECTROSURGICAL) ×4
ELECTRODE REM PT RTRN 9FT ADLT (ELECTROSURGICAL) ×3 IMPLANT
GAUZE VASELINE 3X9 (GAUZE/BANDAGES/DRESSINGS) IMPLANT
GLOVE BIO SURGEON STRL SZ7 (GLOVE) ×16 IMPLANT
GLOVE BIOGEL PI IND STRL 7.0 (GLOVE) ×21 IMPLANT
GLOVE BIOGEL PI INDICATOR 7.0 (GLOVE) ×7
GOWN STRL REUS W/TWL XL LVL3 (GOWN DISPOSABLE) ×4 IMPLANT
HEMOSTAT ARISTA ABSORB 3G PWDR (MISCELLANEOUS) ×4 IMPLANT
KIT ACCESSORY DA VINCI DISP (KITS) ×1
KIT ACCESSORY DVNC DISP (KITS) ×3 IMPLANT
LEGGING LITHOTOMY PAIR STRL (DRAPES) ×4 IMPLANT
LIQUID BAND (GAUZE/BANDAGES/DRESSINGS) ×4 IMPLANT
NEEDLE HYPO 22GX1.5 SAFETY (NEEDLE) ×4 IMPLANT
NEEDLE INSUFFLATION 120MM (ENDOMECHANICALS) ×4 IMPLANT
OCCLUDER COLPOPNEUMO (BALLOONS) ×4 IMPLANT
PACK ROBOT WH (CUSTOM PROCEDURE TRAY) ×4 IMPLANT
PACK ROBOTIC GOWN (GOWN DISPOSABLE) ×4 IMPLANT
PAD PREP 24X48 CUFFED NSTRL (MISCELLANEOUS) ×4 IMPLANT
PAD TRENDELENBURG POSITION (MISCELLANEOUS) ×4 IMPLANT
SEALER ENDOWRIST ONE VESSEL (MISCELLANEOUS) ×4 IMPLANT
SET CYSTO W/LG BORE CLAMP LF (SET/KITS/TRAYS/PACK) IMPLANT
SET IRRIG TUBING LAPAROSCOPIC (IRRIGATION / IRRIGATOR) ×4 IMPLANT
SET TRI-LUMEN FLTR TB AIRSEAL (TUBING) ×4 IMPLANT
SUT VIC AB 0 CT1 27 (SUTURE) ×2
SUT VIC AB 0 CT1 27XBRD ANBCTR (SUTURE) ×6 IMPLANT
SUT VICRYL 0 UR6 27IN ABS (SUTURE) ×8 IMPLANT
SUT VICRYL 4-0 PS2 18IN ABS (SUTURE) ×8 IMPLANT
SUT VLOC 180 0 9IN  GS21 (SUTURE) ×1
SUT VLOC 180 0 9IN GS21 (SUTURE) ×3 IMPLANT
SYSTEM CONVERTIBLE TROCAR (TROCAR) IMPLANT
TIP RUMI ORANGE 6.7MMX12CM (TIP) ×8 IMPLANT
TIP UTERINE 5.1X6CM LAV DISP (MISCELLANEOUS) IMPLANT
TIP UTERINE 6.7X10CM GRN DISP (MISCELLANEOUS) IMPLANT
TIP UTERINE 6.7X6CM WHT DISP (MISCELLANEOUS) IMPLANT
TIP UTERINE 6.7X8CM BLUE DISP (MISCELLANEOUS) IMPLANT
TOWEL OR 17X24 6PK STRL BLUE (TOWEL DISPOSABLE) ×12 IMPLANT
TROCAR DILATING TIP 12MM 150MM (ENDOMECHANICALS) ×4 IMPLANT
TROCAR DISP BLADELESS 8 DVNC (TROCAR) ×3 IMPLANT
TROCAR DISP BLADELESS 8MM (TROCAR) ×1
TROCAR PORT AIRSEAL 5X120 (TROCAR) ×4 IMPLANT
WATER STERILE IRR 1000ML POUR (IV SOLUTION) ×4 IMPLANT

## 2016-02-18 NOTE — Anesthesia Postprocedure Evaluation (Signed)
Anesthesia Post Note  Patient: Barbara Reese  Procedure(s) Performed: Procedure(s) (LRB): ROBOTIC ASSISTED TOTAL HYSTERECTOMY WITH SALPINGECTOMY (N/A) CYSTOSCOPY  Patient location during evaluation: PACU Anesthesia Type: General Level of consciousness: awake and alert Pain management: pain level controlled Vital Signs Assessment: post-procedure vital signs reviewed and stable Respiratory status: spontaneous breathing, nonlabored ventilation, respiratory function stable and patient connected to nasal cannula oxygen Cardiovascular status: blood pressure returned to baseline and stable Postop Assessment: no signs of nausea or vomiting Anesthetic complications: no     Last Vitals:  Filed Vitals:   02/18/16 1130 02/18/16 1145  BP: 127/86 129/85  Pulse: 63 67  Temp:    Resp: 19 18    Last Pain:  Filed Vitals:   02/18/16 1204  PainSc: Asleep   Pain Goal: Patients Stated Pain Goal: 4 (02/18/16 1115)               [name] L

## 2016-02-18 NOTE — Transfer of Care (Signed)
Immediate Anesthesia Transfer of Care Note  Patient: Barbara Reese  Procedure(s) Performed: Procedure(s): ROBOTIC ASSISTED TOTAL HYSTERECTOMY WITH SALPINGECTOMY (N/A) CYSTOSCOPY  Patient Location: PACU  Anesthesia Type:General  Level of Consciousness: awake, alert , oriented and patient cooperative  Airway & Oxygen Therapy: Patient Spontanous Breathing and Patient connected to nasal cannula oxygen  Post-op Assessment: Report given to RN and Post -op Vital signs reviewed and stable  Post vital signs: Reviewed and stable  Last Vitals:  Filed Vitals:   02/18/16 0605  BP: 134/88  Pulse: 84  Temp: 37 C  Resp: 18    Last Pain: There were no vitals filed for this visit.    Patients Stated Pain Goal: 4 ([id number])  Complications: No apparent anesthesia complications

## 2016-02-18 NOTE — Op Note (Signed)
02/18/2016  10:03 AM  PATIENT:  Barbara Reese  50 y.o. female  PRE-OPERATIVE DIAGNOSIS:  AUB; endometrial hyperplasia with atypia  POST-OPERATIVE DIAGNOSIS:  AUB; endometrial hyperplasia with atypia  PROCEDURE:  Procedure(s): ROBOTIC ASSISTED TOTAL HYSTERECTOMY WITH SALPINGECTOMY (N/A) CYSTOSCOPY  SURGEON:  Surgeon(s) and Role:    * Lavonia Drafts, MD - Primary    * Mora Bellman, MD - Assisting  ANESTHESIA:   local and general  EBL:  Total I/O In: 1000 [I.V.:1000] Out: 950 [Urine:800; Blood:150]  BLOOD ADMINISTERED:none  DRAINS: none   LOCAL MEDICATIONS USED:  MARCAINE     SPECIMEN:  Source of Specimen:  uterus, cervix and bilateral fallopian tubes   DISPOSITION OF SPECIMEN:  PATHOLOGY  COUNTS:  YES  TOURNIQUET:  * No tourniquets in log *  DICTATION: .Note written in EPIC  PLAN OF CARE: Admit for overnight observation  PATIENT DISPOSITION:  PACU - hemodynamically stable.   Delay start of Pharmacological VTE agent (>24hrs) due to surgical blood loss or risk of bleeding: yes  Complications: none  The risks, benefits, and alternatives of surgery were explained, understood, and accepted. Consents were signed. All questions were answered. She was taken to the operating room and general anesthesia was applied without complication. She was placed in the dorsal lithotomy position and her abdomen and vagina were prepped and draped after she had been carefully positioned on the table. A bimanual exam revealed a 14 week size uterus that was mobile. Her adnexa were not enlarged. The cervix was measured and the uterus was sounded to 14 cm. A Rumi uterine manipulator was placed without difficulty. A Foley catheter was placed and it drained clear throughout the case. Gloves were changed and attention was turned to the abdomen. A 53mm incision was made in the umbilicus and a Veress needle was placed intraperitoneally. CO2 was used to insufflate the abdomen to approximately  4 L. After good pneumoperitoneum was established, a 12 mm trocar was placed 8cm above the umbilicus.  Laparoscopy confirmed correct placement. She was placed in Trendelenburg position and ports were placed in appropriate positions on her abdomen to allow maximum exposure during the robotic case. Specifically there was an 54mm assistant port placed in the left upper quadrant under direct laproscopic visualization. Two 8 mm ports were placed 8cm lateral and inferior to the midline port.  These were all placed under direct laparoscopic visualization. The robot was docked and I proceeded with a robotic portion of the case.  The pelvis was inspected and the uterus was found to have fibroids and be enlarged.  The fallopian tubes and ovaries were found to be normal. The remainder of her pelvis appeared normal with the exception of adhesions of omentum to the umbilicus.  These were released with monopolar shears. The infundibular ligaments were identified bilaterally. I excised the fallopian tubes bilaterally. The round ligament on each side was cauterized and cut. The Vessel sealer was used for this portion. The round ligaments were identified, cauterized and ligated, a bladder flap was created anteriorly. The uterine vessels were identified and cauterized and then cut.The bladder was pushed out of the operative site and an anterior colpotomy was made. The colpotomy incision was extended circumferentially, following the blue outline of the Rumi manipulator. All pedicles were hemostatic.  The uterus was removed from the vagina with the fallopian tube segments. The vaginal cuff was closed with ) vicryl v-lock suture.  Excellent hemostasis was noted throughout. The pelvis was irrigated.  At this point I performed  cystoscopy. The cystoscopy revealed ejection of urine from both ureters. After determining excellent hemostasis, the robot was undocked. The midline fascial incision was closed with 0 vicryl.  The skin from all of  the other ports was closed with 3-0 vicryl. 30cc of 0.5% Marcaine was injected into the port sites.  The patient was then extubated and taken to recovery in stable condition.   Sponge, lap and needle counts were correct x 2.

## 2016-02-18 NOTE — Anesthesia Procedure Notes (Signed)
Procedure Name: Intubation Date/Time: 02/18/2016 7:40 AM Performed by: Tobin Chad Pre-anesthesia Checklist: Patient identified, Timeout performed, Emergency Drugs available, Suction available and Patient being monitored Patient Re-evaluated:Patient Re-evaluated prior to inductionOxygen Delivery Method: Circle system utilized and Simple face mask Preoxygenation: Pre-oxygenation with 100% oxygen Intubation Type: IV induction and Inhalational induction Ventilation: Mask ventilation without difficulty Laryngoscope Size: Mac and 3 Grade View: Grade I Tube type: Oral Tube size: 7.0 mm Number of attempts: 1 Placement Confirmation: ETT inserted through vocal cords under direct vision,  positive ETCO2 and breath sounds checked- equal and bilateral Secured at: 22 cm Tube secured with: Tape Dental Injury: Teeth and Oropharynx as per pre-operative assessment

## 2016-02-18 NOTE — H&P (Signed)
Preoperative History and Physical  Barbara Reese is a 50 y.o. No obstetric history on file. here for surgical management of AUB and hypdrplasia.   Proposed surgery: Robot assisted total laparoscopic hysterectomy with bilateral salpingectomy  Past Medical History  Diagnosis Date  . Anemia   . Allergic rhinitis   . Insomnia   . GERD (gastroesophageal reflux disease)   . 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 Hypertension   . SVD (spontaneous vaginal delivery)     x 1  . OSA (obstructive sleep apnea)     Does not use CPAP.  Marland Kitchen Anxiety   . Depressive disorder     Hx  . Osteoarthritis     knees  . IBS (irritable bowel syndrome)     diet controlled, no meds  . PONV (postoperative nausea and vomiting)   . 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    Followed by GYN, planning for likely hysterectomy, currently treated with Megace   . Hepatitis     age 18- dx'd with chronic hepatitis- no problems as an adult  . Shortness of breath dyspnea     on exertion   Past Surgical History  Procedure Laterality Date  . Cholecystectomy  1987  . Knee surgery  2004    left knee- Dr. Percell Miller  . Upper gi endoscopy      gerd  . Hysteroscopy w/d&c N/A 12/30/2015    Procedure: DILATATION AND CURETTAGE /HYSTEROSCOPY;  Surgeon: Mora Bellman, MD;  Location: [facility];  Service: Gynecology;  Laterality: N/A;   OB History    No data available     Patient denies any cervical dysplasia or STIs. Prescriptions prior to admission  Medication Sig Dispense Refill Last Dose  . cetirizine (ZYRTEC) 10 MG tablet Take 10 mg by mouth daily as needed for allergies.   Past Month at Unknown time  . clobetasol ointment (TEMOVATE) 0.05 % Apply topically daily as needed. (Patient taking differently: Apply 1 application topically daily as needed (skin irritation). ) 45 g 2 Past Week at Unknown time  . docusate sodium (COLACE) 100 MG capsule Take 100 mg by mouth daily as needed for mild constipation.   Past Week  at Unknown time  . fluticasone (FLONASE) 50 MCG/ACT nasal spray Place 1 spray into both nostrils daily. (Patient taking differently: Place 2 sprays into both nostrils daily as needed for allergies. ) 16 g 1 Past Week at Unknown time  . hydrochlorothiazide (HYDRODIURIL) 25 MG tablet Take 1 tablet (25 mg total) by mouth daily. 90 tablet 0 02/18/2016 at 0545  . ibuprofen (ADVIL,MOTRIN) 600 MG tablet Take 1 tablet (600 mg total) by mouth every 6 (six) hours as needed. (Patient taking differently: Take 600 mg by mouth every 6 (six) hours as needed for mild pain or cramping. ) 30 tablet 1 Past Week at Unknown time  . oxyCODONE-acetaminophen (PERCOCET/ROXICET) 5-325 MG tablet Take 1 tablet by mouth every 4 (four) hours as needed. (Patient taking differently: Take 1 tablet by mouth every 4 (four) hours as needed for moderate pain. ) 20 tablet 0 Past Month at Unknown time  . pantoprazole (PROTONIX) 40 MG tablet Take 1 tablet (40 mg total) by mouth daily. 90 tablet 3 02/18/2016 at 0545  . Prenatal Vit-Fe Fumarate-FA (PRENATAL MULTIVITAMIN) TABS tablet Take 1 tablet by mouth daily at 12 noon.   Past Week at Unknown time  . traMADol (ULTRAM) 50 MG tablet Take 1 tablet (50 mg total) by mouth every 6 (six) hours as needed for  moderate pain. 30 tablet 1 Past Month at Unknown time  . megestrol (MEGACE) 40 MG tablet Take 2 tablets (80 mg total) by mouth 2 (two) times daily. (Patient not taking: Reported on 01/27/2016) 120 tablet 3 Not Taking    No Known Allergies Social History:   reports that she has never smoked. She has never used smokeless tobacco. She reports that she drinks about 1.2 oz of alcohol per week. She reports that she does not use illicit drugs. Family History  Problem Relation Age of Onset  . Osteoarthritis Mother   . Hypertension Mother   . Diabetes Father   . Hypertension Sister   . Heart attack Brother   . Heart attack Sister   . Allergies Mother   . Allergies Son   . Allergies Sister   .  Asthma Son   . Clotting disorder Sister   . Rheum arthritis Mother     Review of Systems: Noncontributory  PHYSICAL EXAM: Blood pressure 134/88, pulse 84, temperature 98.6 F (37 C), temperature source Oral, resp. rate 18, last menstrual period 02/09/2016, SpO2 100 %. General appearance - alert, well appearing, and in no distress Chest - clear to auscultation, no wheezes, rales or rhonchi, symmetric air entry Heart - normal rate and regular rhythm Abdomen - soft, nontender, nondistended, no masses or organomegaly Pelvic - examination deferred Extremities - peripheral pulses normal, no pedal edema, no clubbing or cyanosis  Labs: Results for orders placed or performed during the hospital encounter of 02/18/16 (from the past 336 hour(s))  Pregnancy, urine   Collection Time: 02/18/16  6:00 AM  Result Value Ref Range   Preg Test, Ur NEGATIVE NEGATIVE  Results for orders placed or performed during the hospital encounter of 02/10/16 (from the past 336 hour(s))  Basic metabolic panel   Collection Time: 02/10/16  4:20 PM  Result Value Ref Range   Sodium 137 135 - 145 mmol/L   Potassium 3.6 3.5 - 5.1 mmol/L   Chloride 101 101 - 111 mmol/L   CO2 29 22 - 32 mmol/L   Glucose, Bld 119 (H) 65 - 99 mg/dL   BUN 15 6 - 20 mg/dL   Creatinine, Ser 0.92 0.44 - 1.00 mg/dL   Calcium 9.8 8.9 - 10.3 mg/dL   GFR calc non Af Amer >60 >60 mL/min   GFR calc Af Amer >60 >60 mL/min   Anion gap 7 5 - 15  CBC   Collection Time: 02/10/16  4:20 PM  Result Value Ref Range   WBC 6.3 4.0 - 10.5 K/uL   RBC 4.66 3.87 - 5.11 MIL/uL   Hemoglobin 11.4 (L) 12.0 - 15.0 g/dL   HCT 36.2 36.0 - 46.0 %   MCV 77.7 (L) 78.0 - 100.0 fL   MCH 24.5 (L) 26.0 - 34.0 pg   MCHC 31.5 30.0 - 36.0 g/dL   RDW 18.1 (H) 11.5 - 15.5 %   Platelets 343 150 - 400 K/uL    Imaging Studies: No results found.  Assessment: Patient Active Problem List   Diagnosis Date Noted  . 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  Trigger thumb of right hand 01/02/2016  . DUB (dysfunctional uterine bleeding)   . Intermenstrual bleeding 08/08/2015  . Situational anxiety 10/08/2014  . Rectal bleeding 09/18/2014  . Gout of big toe 05/23/2014  . Derang of medial meniscus due to old tear/inj, unsp knee 05/17/2014  . Arthritis of knee, degenerative 10/26/2013  . Hypertension 12/24/2012  . Physical examination of employee  12/01/2012  . Arthritis 12/01/2012  . Elevated LDL cholesterol level 12/16/2011  . Prediabetes 12/16/2011  . Health maintenance examination 11/14/2011  . Depression 11/14/2011  . 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 ANXIETY DISORDER 11/06/2009  . OBESITY, MODERATE 01/04/2009  . OBSTRUCTIVE SLEEP APNEA 12/28/2008  . 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  . 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 GERD 02/01/2008    Plan: Patient will undergo surgical management with Robot assisted total laparoscopic hysterectomy with bilateral salpingectomy.   The risks of surgery were discussed in detail with the patient including but not limited to: bleeding which may require transfusion or reoperation; infection which may require antibiotics; injury to surrounding organs which may involve bowel, bladder, ureters ; need for additional procedures including laparoscopy or laparotomy; thromboembolic phenomenon, surgical site problems and other postoperative/anesthesia complications. Likelihood of success in alleviating the patient's condition was discussed. Routine postoperative instructions will be reviewed with the patient and her family in detail after surgery.  The patient concurred with the proposed plan, giving informed written consent for the surgery.  Patient has been NPO since last night she will remain NPO for procedure.  Anesthesia and OR aware.  Preoperative prophylactic antibiotics and SCDs ordered on call to the OR.  To OR when ready.  Oberon Hehir L. Ihor Dow, M.D., [facility] 02/18/2016 7:13 AM

## 2016-02-18 NOTE — Brief Op Note (Signed)
02/18/2016  10:03 AM  PATIENT:  Barbara Reese  50 y.o. female  PRE-OPERATIVE DIAGNOSIS:  AUB; endometrial hyperplasia with atypia  POST-OPERATIVE DIAGNOSIS:  AUB; endometrial hyperplasia with atypia  PROCEDURE:  Procedure(s): ROBOTIC ASSISTED TOTAL HYSTERECTOMY WITH SALPINGECTOMY (N/A) CYSTOSCOPY  SURGEON:  Surgeon(s) and Role:    * Lavonia Drafts, MD - Primary    * Mora Bellman, MD - Assisting  ANESTHESIA:   local and general  EBL:  Total I/O In: 1000 [I.V.:1000] Out: 950 [Urine:800; Blood:150]  BLOOD ADMINISTERED:none  DRAINS: none   LOCAL MEDICATIONS USED:  MARCAINE     SPECIMEN:  Source of Specimen:  uterus, cervix and bilateral fallopian tubes   DISPOSITION OF SPECIMEN:  PATHOLOGY  COUNTS:  YES  TOURNIQUET:  * No tourniquets in log *  DICTATION: .Note written in EPIC  PLAN OF CARE: Admit for overnight observation  PATIENT DISPOSITION:  PACU - hemodynamically stable.   Delay start of Pharmacological VTE agent (>24hrs) due to surgical blood loss or risk of bleeding: yes  Complications: none

## 2016-02-19 ENCOUNTER — Encounter (HOSPITAL_COMMUNITY): Payer: Self-pay | Admitting: Obstetrics & Gynecology

## 2016-02-19 DIAGNOSIS — D251 Intramural leiomyoma of uterus: Secondary | ICD-10-CM | POA: Diagnosis not present

## 2016-02-19 LAB — CBC
HCT: 31.2 % — ABNORMAL LOW (ref 36.0–46.0)
HEMOGLOBIN: 10 g/dL — AB (ref 12.0–15.0)
MCH: 24.8 pg — AB (ref 26.0–34.0)
MCHC: 32.1 g/dL (ref 30.0–36.0)
MCV: 77.2 fL — ABNORMAL LOW (ref 78.0–100.0)
Platelets: 333 10*3/uL (ref 150–400)
RBC: 4.04 MIL/uL (ref 3.87–5.11)
RDW: 18.2 % — AB (ref 11.5–15.5)
WBC: 11.3 10*3/uL — ABNORMAL HIGH (ref 4.0–10.5)

## 2016-02-19 LAB — BASIC METABOLIC PANEL
Anion gap: 7 (ref 5–15)
BUN: 11 mg/dL (ref 6–20)
CALCIUM: 8.7 mg/dL — AB (ref 8.9–10.3)
CHLORIDE: 98 mmol/L — AB (ref 101–111)
CO2: 27 mmol/L (ref 22–32)
CREATININE: 0.88 mg/dL (ref 0.44–1.00)
GFR calc non Af Amer: >60 mL/min (ref >60)
Glucose, Bld: 110 mg/dL — ABNORMAL HIGH (ref 65–99)
Potassium: 3.8 mmol/L (ref 3.5–5.1)
SODIUM: 132 mmol/L — AB (ref 135–145)

## 2016-02-19 MED ORDER — DOCUSATE SODIUM 100 MG PO CAPS: 100.0000 mg | ORAL_CAPSULE | Freq: Two times a day (BID) | ORAL | Status: DC

## 2016-02-19 MED ORDER — OXYCODONE-ACETAMINOPHEN 5-325 MG PO TABS: 1.0000 | ORAL_TABLET | ORAL | Status: DC | PRN

## 2016-02-19 MED ORDER — IBUPROFEN 600 MG PO TABS: 600.0000 mg | ORAL_TABLET | Freq: Four times a day (QID) | ORAL | Status: DC | PRN

## 2016-02-19 NOTE — Addendum Note (Signed)
Addendum  created 02/19/16 [id number] by Riki Sheer, CRNA   Modules edited: Clinical Notes   Clinical Notes:  File: [id number]

## 2016-02-19 NOTE — Discharge Summary (Signed)
Physician Discharge Summary  Patient ID: Barbara Reese MRN: [id number] DOB/AGE: 50-Oct-1967 50 y.o.  Admit date: 02/18/2016 Discharge date: 02/19/2016  Admission Diagnoses:  Discharge Diagnoses:  Principal Problem:   Endometrial hyperplasia with atypia Active Problems:   OBESITY, MODERATE   ANEMIA   GERD   DUB (dysfunctional uterine bleeding)   Fibroids   Postoperative state   Discharged Condition: good  Hospital Course: Patient had an uncomplicated surgery; for further details of this surgery, please refer to the operative note. Furthermore, the patient had an uncomplicated postoperative course.  By time of discharge, her pain was controlled on oral pain medications; she was ambulating, voiding without difficulty, tolerating regular diet and passing flatus.  She was deemed stable for discharge to home.    Consults: None  Significant Diagnostic Studies: labs: CBC and BMP  Treatments: surgery: Robot assisted total laparoscopic hysterectomy with bilateral salpingectomy  Discharge Exam: Blood pressure 136/75, pulse 67, temperature 98.1 F (36.7 C), temperature source Oral, resp. rate 20, height 5\' 6"  (1.676 m), weight 252 lb (114.306 kg), last menstrual period 02/09/2016, SpO2 100 %.  I/O last 3 completed shifts: In: -  Out: 800 [Urine:800] Total I/O In: 1700 [P.O.:950; I.V.:750] Out: 1050 [Urine:1050]   General appearance: alert and no distress Resp: clear to auscultation bilaterally Cardio: regular rate and rhythm, S1, S2 normal, no murmur, click, rub or gallop GI: soft, non-tender; bowel sounds normal; no masses,  no organomegaly Extremities: extremities normal, atraumatic, no cyanosis or edema Incision/Wound: port sites clean, dry and intact  CBC Latest Ref Rng 02/19/2016 02/10/2016 12/20/2015  WBC 4.0 - 10.5 K/uL 11.3(H) 6.3 5.8  Hemoglobin 12.0 - 15.0 g/dL 10.0(L) 11.4(L) 10.3(L)  Hematocrit 36.0 - 46.0 % 31.2(L) 36.2 32.0(L)  Platelets 150 - 400 K/uL 333 343 369      Disposition: 01-Home or Self Care  Discharge Instructions    Call MD for:  difficulty breathing, headache or visual disturbances    Complete by:  As directed      Call MD for:  extreme fatigue    Complete by:  As directed      Call MD for:  hives    Complete by:  As directed      Call MD for:  persistant dizziness or light-headedness    Complete by:  As directed      Call MD for:  persistant nausea and vomiting    Complete by:  As directed      Call MD for:  redness, tenderness, or signs of infection (pain, swelling, redness, odor or green/yellow discharge around incision site)    Complete by:  As directed      Call MD for:  severe uncontrolled pain    Complete by:  As directed      Call MD for:  temperature >100.4    Complete by:  As directed      Diet - low sodium heart healthy    Complete by:  As directed      Discharge wound care:    Complete by:  As directed   Remove outer midline dressing in 6 days     Driving Restrictions    Complete by:  As directed   No driving for 2 weeks or while on pain meds by mouth     Increase activity slowly    Complete by:  As directed      Lifting restrictions    Complete by:  As directed   No heavy lifting for  4 weeks     Sexual Activity Restrictions    Complete by:  As directed   No sexual intercourse for 8 weeks            Medication List    TAKE these medications        cetirizine 10 MG tablet  Commonly known as:  ZYRTEC  Take 10 mg by mouth daily as needed for allergies.     clobetasol ointment 0.05 %  Commonly known as:  TEMOVATE  Apply topically daily as needed.     docusate sodium 100 MG capsule  Commonly known as:  COLACE  Take 1 capsule (100 mg total) by mouth 2 (two) times daily.     fluticasone 50 MCG/ACT nasal spray  Commonly known as:  FLONASE  Place 1 spray into both nostrils daily.     hydrochlorothiazide 25 MG tablet  Commonly known as:  HYDRODIURIL  Take 1 tablet (25 mg total) by mouth daily.      ibuprofen 600 MG tablet  Commonly known as:  ADVIL,MOTRIN  Take 1 tablet (600 mg total) by mouth every 6 (six) hours as needed (mild pain).     oxyCODONE-acetaminophen 5-325 MG tablet  Commonly known as:  PERCOCET/ROXICET  Take 1-2 tablets by mouth every 4 (four) hours as needed (moderate to severe pain (when tolerating fluids)).     pantoprazole 40 MG tablet  Commonly known as:  PROTONIX  Take 1 tablet (40 mg total) by mouth daily.     prenatal multivitamin Tabs tablet  Take 1 tablet by mouth daily at 12 noon.     traMADol 50 MG tablet  Commonly known as:  ULTRAM  Take 1 tablet (50 mg total) by mouth every 6 (six) hours as needed for moderate pain.           Follow-up Information    Follow up with Lavonia Drafts, MD In 2 weeks.   Specialty:  Obstetrics and Gynecology   Contact information:   [place] [address] [phone]       Signed: Lavonia Drafts 02/19/2016, 12:37 PM

## 2016-02-19 NOTE — Discharge Instructions (Signed)
Total Laparoscopic Hysterectomy, Care After °Refer to this sheet in the next few weeks. These instructions provide you with information on caring for yourself after your procedure. Your health care provider may also give you more specific instructions. Your treatment has been planned according to current medical practices, but problems sometimes occur. Call your health care provider if you have any problems or questions after your procedure. °WHAT TO EXPECT AFTER THE PROCEDURE °· Pain and bruising at the incision sites. You will be given pain medicine to control it. °· Menopausal symptoms such as hot flashes, night sweats, and insomnia if your ovaries were removed. °· Sore throat from the breathing tube that was inserted during surgery. °HOME CARE INSTRUCTIONS °· Only take over-the-counter or prescription medicines for pain, discomfort, or fever as directed by your health care provider.   °· Do not take aspirin. It can cause bleeding.   °· Do not drive when taking pain medicine.   °· Follow your health care provider's advice regarding diet, exercise, lifting, driving, and general activities.   °· Resume your usual diet as directed and allowed.   °· Get plenty of rest and sleep.   °· Do not douche, use tampons, or have sexual intercourse for at least 6 weeks, or until your health care provider gives you permission.   °· Change your bandages (dressings) as directed by your health care provider.   °· Monitor your temperature and notify your health care provider of a fever.   °· Take showers instead of baths for 2-3 weeks.   °· Do not drink alcohol until your health care provider gives you permission.   °· If you develop constipation, you may take a mild laxative with your health care provider's permission. Bran foods may help with constipation problems. Drinking enough fluids to keep your urine clear or pale yellow may help as well.   °· Try to have someone home with you for 1-2 weeks to help around the house.    °· Keep all of your follow-up appointments as directed by your health care provider.   °SEEK MEDICAL CARE IF: °· You have swelling, redness, or increasing pain around your incision sites.   °· You have pus coming from your incision.   °· You notice a bad smell coming from your incision.   °· Your incision breaks open.   °· You feel dizzy or lightheaded.   °· You have pain or bleeding when you urinate.   °· You have persistent diarrhea.   °· You have persistent nausea and vomiting.   °· You have abnormal vaginal discharge.   °· You have a rash.   °· You have any type of abnormal reaction or develop an allergy to your medicine.   °· You have poor pain control with your prescribed medicine.   °SEEK IMMEDIATE MEDICAL CARE IF: °· You have chest pain or shortness of breath. °· You have severe abdominal pain that is not relieved with pain medicine. °· You have pain or swelling in your legs. °MAKE SURE YOU: °· Understand these instructions. °· Will watch your condition. °· Will get help right away if you are not doing well or get worse. °  °This information is not intended to replace advice given to you by your health care provider. Make sure you discuss any questions you have with your health care provider. °  °Document Released: 05/31/2013 Document Revised: 08/15/2013 Document Reviewed: 05/31/2013 °Elsevier Interactive Patient Education ©2016 [employer] ° °

## 2016-02-19 NOTE — Anesthesia Postprocedure Evaluation (Signed)
Anesthesia Post Note  Patient: Barbara Reese  Procedure(s) Performed: Procedure(s) (LRB): ROBOTIC ASSISTED TOTAL HYSTERECTOMY WITH SALPINGECTOMY (N/A) CYSTOSCOPY  Patient location during evaluation: [facility] Anesthesia Type: General Level of consciousness: awake and alert Pain management: pain level controlled Vital Signs Assessment: post-procedure vital signs reviewed and stable Respiratory status: spontaneous breathing, nonlabored ventilation and respiratory function stable Cardiovascular status: blood pressure returned to baseline and stable Postop Assessment: no signs of nausea or vomiting Anesthetic complications: no     Last Vitals:  Filed Vitals:   02/19/16 0122 02/19/16 0515  BP: 126/79 136/75  Pulse: 85 67  Temp: 37.1 C 36.7 C  Resp: 20 20    Last Pain:  Filed Vitals:   02/19/16 0631  PainSc: Asleep   Pain Goal: Patients Stated Pain Goal: 4 (02/19/16 0515)               Riki Sheer

## 2016-02-24 ENCOUNTER — Telehealth: Payer: Self-pay | Admitting: General Practice

## 2016-02-24 NOTE — Telephone Encounter (Signed)
Patient called into front office stating when she passes gas or has a bowel movement she gets a very painful sensation almost like a spasm in her lower rectum. Patient reports taking once percocet a day & is on stool softeners. Patient denies constipation or hard stools but did have a significant amount before surgery. Discussed with patient she possibly could have internal hemorrhoids causing the pain. Recommended anusol hcl or witch hazel pads to see if pain improves. Patient verbalized understanding & had no questions

## 2016-02-24 NOTE — Telephone Encounter (Signed)
Opened in error

## 2016-02-28 ENCOUNTER — Ambulatory Visit (INDEPENDENT_AMBULATORY_CARE_PROVIDER_SITE_OTHER): Payer: Managed Care, Other (non HMO) | Admitting: Obstetrics & Gynecology

## 2016-02-28 VITALS — BP 127/81 | HR 76 | Wt 244.0 lb

## 2016-02-28 DIAGNOSIS — K649 Unspecified hemorrhoids: Secondary | ICD-10-CM

## 2016-02-28 DIAGNOSIS — Z9889 Other specified postprocedural states: Secondary | ICD-10-CM

## 2016-02-28 NOTE — Progress Notes (Signed)
Patient ID: Barbara Reese, female   DOB: Apr 24, 1966, 50 y.o.   MRN: [id number] History:  50 y.o. Pt is here today for post op  Check. Pt called with c/o rectal pain with passage of gas or stool only.  She reports that it is better now.  She has a h/o hemorrhoids that have never given her problems but, she used the hydrocortisone that I recommended and she reports that she feels better but that the sx are not completely gone. She denies pelvic pain and is completely off all pain meds.  She has no bleeding or abnormal discharge.  She denies fever or chills.  She is voiding without difficulty. She is eating well.  She has been walking for exercise with no problems.  She denies rectal bleeding.  The following portions of the patient's history were reviewed and updated as appropriate: allergies, current medications, past family history, past medical history, past social history, past surgical history and problem list.  Review of Systems:  Pertinent items are noted in HPI.  Objective:  Physical Exam Blood pressure 127/81, pulse 76, weight 244 lb (110.678 kg), last menstrual period 02/09/2016. Gen: NAD Abd: Soft, nontender and nondistended. Port sites healing well.  Glue still noted on port sites. Pelvic: Normal appearing external genitalia.  Internal exam not done. Anus: there is a large prolapsed hemorrhoid that is non thrombosed.     Labs and Imaging 02/18/2016 Diagnosis Uterus, cervix and bilateral fallopian tubes ENDOMETRIUM: PROLIFERATIVE ENDOMETRIUM HYPERPLASIA WITHOUT ATYPIA ENDOMETRIAL POLYP MYOMETRIUM: ADENOMYOSIS AND LEIOMYOMAS CERVIX: CHRONIC CERVICITIS AND SQUAMOUS METAPLASIA BILATERAL FALLOPIAN TUBES: HISTOLOGICAL UNREMARKABLE   Assessment & Plan:  2 week post op check- doing well Symptomatic hemorrhoids  Hydrocortisone 1% apply after warm compress F/u in 4 weeks for post op check Reviewed surg path with pt   Barbara Reese, M.D., Barbara Reese

## 2016-02-28 NOTE — Patient Instructions (Signed)

## 2016-03-05 ENCOUNTER — Ambulatory Visit: Payer: Managed Care, Other (non HMO) | Admitting: Obstetrics & Gynecology

## 2016-03-13 ENCOUNTER — Other Ambulatory Visit: Payer: Self-pay

## 2016-03-13 ENCOUNTER — Encounter: Payer: Self-pay | Admitting: Family Medicine

## 2016-03-13 ENCOUNTER — Ambulatory Visit (INDEPENDENT_AMBULATORY_CARE_PROVIDER_SITE_OTHER): Payer: Managed Care, Other (non HMO) | Admitting: Family Medicine

## 2016-03-13 ENCOUNTER — Ambulatory Visit (INDEPENDENT_AMBULATORY_CARE_PROVIDER_SITE_OTHER)
Admission: RE | Admit: 2016-03-13 | Discharge: 2016-03-13 | Disposition: A | Discharge status: Home / Self Care | Payer: Managed Care, Other (non HMO) | Source: Ambulatory Visit | Attending: Family Medicine | Admitting: Family Medicine

## 2016-03-13 VITALS — BP 118/80 | HR 78 | Wt 248.0 lb

## 2016-03-13 DIAGNOSIS — M5416 Radiculopathy, lumbar region: Secondary | ICD-10-CM | POA: Diagnosis not present

## 2016-03-13 DIAGNOSIS — E669 Obesity, unspecified: Secondary | ICD-10-CM

## 2016-03-13 DIAGNOSIS — M25562 Pain in left knee: Secondary | ICD-10-CM | POA: Diagnosis not present

## 2016-03-13 DIAGNOSIS — M1712 Unilateral primary osteoarthritis, left knee: Secondary | ICD-10-CM

## 2016-03-13 MED ORDER — GABAPENTIN 100 MG PO CAPS: 200.0000 mg | ORAL_CAPSULE | Freq: Every day | ORAL | Status: DC

## 2016-03-13 MED ORDER — PREDNISONE 50 MG PO TABS: 50.0000 mg | ORAL_TABLET | Freq: Every day | ORAL | Status: DC

## 2016-03-13 NOTE — Assessment & Plan Note (Signed)
Encourage weight loss. 

## 2016-03-13 NOTE — Progress Notes (Signed)
[facility] [place],  [address] Phone: [phone] Subjective:     CC left knee pain  [id number] Barbara Reese is a 50 y.o. female coming in with complaint of left knee pain. Patient does cause pain as a dull, throbbing aching pain very similar to what she had back in August. 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 Respond very well to an injection previously. Since patient did have a hysterectomy she's been having more discomfort in the knee. States that no significant instability but is walking with the aid of a cane. Does feel like she is having some weakness.  Patient also states that now she is having pain on the lateral aspect the leg that seems to come from the back. Patient states that she moves a certain way she has a severe pain radiates all way to her foot. Starting have a constant numbness. Does not know if this is related to her knee. Been going on for about 10 days and worsening. Patient states that daily activities are becoming more difficult and sometimes can wake her out of the sleep. Does not remember any true injury. Back was hurting somewhat right after even the surgery.     Past medical history, social, surgical and family history all reviewed in electronic medical record.   Review of Systems: No headache, visual changes, nausea, vomiting, diarrhea, constipation, dizziness, abdominal pain, skin rash, fevers, chills, night sweats, weight loss, swollen lymph nodes, body aches, joint swelling, muscle aches, chest pain, shortness of breath, mood changes.   Objective Blood pressure 118/80, pulse 78, weight 248 lb (112.492 kg), last menstrual period 02/09/2016.  General: No apparent distress alert and oriented x3 mood and affect normal, dressed appropriately. obese HEENT: Pupils equal, extraocular movements intact  Respiratory: Patient's speak in full sentences and does not appear short of breath    Cardiovascular: No lower extremity edema, non tender, no erythema  Skin: Warm dry intact with no signs of infection or rash on extremities or on axial skeleton.  Abdomen: Soft nontender  Neuro: Cranial nerves II through XII are intact, neurovascularly intact in all extremities with 2+ DTRs and 2+ pulses.  Lymph: No lymphadenopathy of posterior or anterior cervical chain or axillae bilaterally.  Gait antalgic walking with a cane MSK:  Non tender with full range of motion and good stability and symmetric strength and tone of shoulders, elbows, wrist, hip, and ankles bilaterally.   Back Exam:  Inspection: Unremarkable  Motion: Flexion 25 deg with radiation going down the left leg, Extension 25 deg, Side Bending to 35 deg bilaterally,  Rotation to 35 deg bilaterally  SLR laying: severely positive left-sided 15 XSLR laying: Negative  Palpable tenderness: severe tenderness over the paraspinal musculature of L4-L5 and L5-S1 on the left side. FABER: unable to do secondary to pain Sensory change: Gross sensation intact to all lumbar and sacral dermatomes.  Reflexes: 2+ at both patellar tendons, 2+ at achilles tendons, Babinski's downgoing.  Strength at foot  Plantar-flexion: 5/5 Dorsi-flexion: 5/5 Eversion: 5/5 Inversion: 5/5   Leg strength  Quad: 5/5  But with pain Hamstring: 5/5 Hip flexor: 5/5 Hip abductors: 5/5      Left knee exam shows severe medial joint line tenderness but full range of motion. Neurovascular intact distally with mild crepitus of the knee. Negative McMurray's.    Procedure: Real-time Ultrasound Guided Injection of left knee Device: GE Logiq E  Ultrasound guided injection is preferred based  studies that show increased duration, increased effect, greater accuracy, decreased procedural pain, increased response rate, and decreased cost with ultrasound guided versus blind injection.  Verbal informed consent obtained.  Time-out conducted.  Noted no overlying erythema,  induration, or other signs of local infection.  Skin prepped in a sterile fashion.  Local anesthesia: Topical Ethyl chloride.  With sterile technique and under real time ultrasound guidance: With a 22-gauge 2 inch needle patient was injected with 4 cc of 0.5% Marcaine and 1 cc of Kenalog 40 mg/dL. This was from a superior lateral approach.  Completed without difficulty  Pain immediately resolved suggesting accurate placement of the medication.  Advised to call if fevers/chills, erythema, induration, drainage, or persistent bleeding.  Images permanently stored and available for review in the ultrasound unit.  Impression: Technically successful ultrasound guided injection.    Impression and Recommendations:     This case required medical decision making of moderate complexity.

## 2016-03-13 NOTE — Patient Instructions (Signed)
I am sorry for the bad news I think it is your back  Prednisone daily for 5 days Gabapentin 200mg  at night  Ice 20 minutes 2 times daily. Usually after activity and before bed. Exercises 3 times a week.  See me again in 2 weeks to make sure you are doing better. Send me a message in a week as well.

## 2016-03-13 NOTE — Assessment & Plan Note (Signed)
Patient given injection and tolerated the procedure well. Discussed with patient about possible bracing. Does have tramadol for break pain. We discussed icing regimen. Patient did feel significantly better after the injection again. We'll start the home exercises regularly. Return to clinic in 4 weeks. Patient could be a candidate for viscous supplementation.

## 2016-03-13 NOTE — Assessment & Plan Note (Signed)
Patient does have more of a lumbar radiculopathy. We discussed icing regimen. We discussed prednisone as well as gabapentin. We discussed if worsening symptoms and seek medical attention immediately. Neck shows are pending. Follow-up again in 2 weeks

## 2016-03-25 ENCOUNTER — Encounter: Payer: Self-pay | Admitting: Obstetrics & Gynecology

## 2016-03-25 ENCOUNTER — Ambulatory Visit (INDEPENDENT_AMBULATORY_CARE_PROVIDER_SITE_OTHER): Payer: Managed Care, Other (non HMO) | Admitting: Obstetrics & Gynecology

## 2016-03-25 VITALS — BP 136/85 | HR 92 | Wt 248.4 lb

## 2016-03-25 DIAGNOSIS — Z9889 Other specified postprocedural states: Secondary | ICD-10-CM

## 2016-03-25 NOTE — Progress Notes (Signed)
History:  50 y.o. Pt here for 6 week post op check.  She denies bleeding or pain. She has been walking for exercise and doing housework.  She  Denies being sexually active since surgery.   The following portions of the patient's history were reviewed and updated as appropriate: allergies, current medications, past family history, past medical history, past social history, past surgical history and problem list.  Review of Systems:  Pertinent items are noted in HPI.  Objective:  Physical Exam Blood pressure 136/85, pulse 92, weight 248 lb 6.4 oz (112.7 kg), last menstrual period 02/09/2016. Gen: NAD Abd: Soft, nontender and nondistended; port sites healing well Pelvic: Normal appearing external genitalia; normal appearing vaginal mucosa and cervix.  Normal discharge.  Small uterus, no other palpable masses, no uterine or adnexal tenderness   Assessment & Plan:  6 weeks post op check- doing well  F/u in 3 months or sooner prn No intercourse for 2 more weeks Gradual return to full activity May RTW.  Anisten Tomassi L. Harraway-Smith, M.D., [name]

## 2016-03-25 NOTE — Patient Instructions (Signed)
Total Laparoscopic Hysterectomy, Care After °Refer to this sheet in the next few weeks. These instructions provide you with information on caring for yourself after your procedure. Your health care provider may also give you more specific instructions. Your treatment has been planned according to current medical practices, but problems sometimes occur. Call your health care provider if you have any problems or questions after your procedure. °WHAT TO EXPECT AFTER THE PROCEDURE °· Pain and bruising at the incision sites. You will be given pain medicine to control it. °· Menopausal symptoms such as hot flashes, night sweats, and insomnia if your ovaries were removed. °· Sore throat from the breathing tube that was inserted during surgery. °HOME CARE INSTRUCTIONS °· Only take over-the-counter or prescription medicines for pain, discomfort, or fever as directed by your health care provider.   °· Do not take aspirin. It can cause bleeding.   °· Do not drive when taking pain medicine.   °· Follow your health care provider's advice regarding diet, exercise, lifting, driving, and general activities.   °· Resume your usual diet as directed and allowed.   °· Get plenty of rest and sleep.   °· Do not douche, use tampons, or have sexual intercourse for at least 6 weeks, or until your health care provider gives you permission.   °· Change your bandages (dressings) as directed by your health care provider.   °· Monitor your temperature and notify your health care provider of a fever.   °· Take showers instead of baths for 2-3 weeks.   °· Do not drink alcohol until your health care provider gives you permission.   °· If you develop constipation, you may take a mild laxative with your health care provider's permission. Bran foods may help with constipation problems. Drinking enough fluids to keep your urine clear or pale yellow may help as well.   °· Try to have someone home with you for 1-2 weeks to help around the house.    °· Keep all of your follow-up appointments as directed by your health care provider.   °SEEK MEDICAL CARE IF: °· You have swelling, redness, or increasing pain around your incision sites.   °· You have pus coming from your incision.   °· You notice a bad smell coming from your incision.   °· Your incision breaks open.   °· You feel dizzy or lightheaded.   °· You have pain or bleeding when you urinate.   °· You have persistent diarrhea.   °· You have persistent nausea and vomiting.   °· You have abnormal vaginal discharge.   °· You have a rash.   °· You have any type of abnormal reaction or develop an allergy to your medicine.   °· You have poor pain control with your prescribed medicine.   °SEEK IMMEDIATE MEDICAL CARE IF: °· You have chest pain or shortness of breath. °· You have severe abdominal pain that is not relieved with pain medicine. °· You have pain or swelling in your legs. °MAKE SURE YOU: °· Understand these instructions. °· Will watch your condition. °· Will get help right away if you are not doing well or get worse. °  °This information is not intended to replace advice given to you by your health care provider. Make sure you discuss any questions you have with your health care provider. °  °Document Released: 05/31/2013 Document Revised: 08/15/2013 Document Reviewed: 05/31/2013 °Elsevier Interactive Patient Education ©2016 [employer] ° °

## 2016-03-25 NOTE — Progress Notes (Signed)
[facility] [place], [place] [address] Phone: [phone] Subjective:    CC left knee pain f/u Lumbar radiculopathy follow up  [id number]  Barbara Reese is a 50 y.o. female coming in with complaint of left knee pain. Patient does cause pain as a dull, throbbing aching pain very similar to what she had back in August. 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 Respond very well to an injection previously. Patient was given an injection again in last exam and is feeling 95% better.  Patient was also found to have what appeared to be more of a lumbar radiculopathy. Patient did have x-rays at last exam that were independently visualized by me showing degenerative disc disease as well as some facet arthropathy moderate in severity. Patient was given prednisone as well as gabapentin. Patient was to do an icing pedicle. Patient was also given some exercise. Patient states unfortunately the back pain has helped out only minimally. Patient states that the pain continues to give her significant difficulty during the day as well as at night. Patient has to return to work on Monday and is unable to walk without the aid of a cane and cannot pick up anything greater than 10 pounds without severe pain with some radiation going down the leg. Patient denies numbness. Sometimes feel like she is unstable. States that it is even waking her up at night. Patient is concerned because she is not making any significant improvement.  Past medical history, social, surgical and family history all reviewed in electronic medical record.   Review of Systems: No headache, visual changes, nausea, vomiting, diarrhea, constipation, dizziness, abdominal pain, skin rash, fevers, chills, night sweats, weight loss, swollen lymph nodes, body aches, joint swelling, muscle aches, chest pain, shortness of breath, mood changes.   Objective  Blood pressure 120/82, pulse 87,  weight 251 lb (113.9 kg), last menstrual period 02/09/2016, SpO2 99 %.  General: No apparent distress alert and oriented x3 mood and affect normal, dressed appropriately. obese HEENT: Pupils equal, extraocular movements intact  Respiratory: Patient's speak in full sentences and does not appear short of breath  Cardiovascular: No lower extremity edema, non tender, no erythema  Skin: Warm dry intact with no signs of infection or rash on extremities or on axial skeleton.  Abdomen: Soft nontender  Neuro: Cranial nerves II through XII are intact, neurovascularly intact in all extremities with 2+ DTRs and 2+ pulses.  Lymph: No lymphadenopathy of posterior or anterior cervical chain or axillae bilaterally.  Gait antalgic walking with a cane MSK:  Non tender with full range of motion and good stability and symmetric strength and tone of shoulders, elbows, wrist, hip, and ankles bilaterally.   Back Exam:  Inspection: Unremarkable  Motion: Flexion 25 deg with radiation going down the left leg, Extension 25 deg, Side Bending to 35 deg bilaterally,  Rotation to 35 deg bilaterally  SLR laying: severely positive left-sided 15 worsen previous exam XSLR laying: Negative  Palpable tenderness: severe tenderness over the paraspinal musculature of L4-L5 and L5-S1 on the left side. Brought tears to patient's eyes FABER: unable to do secondary to pain Sensory change: Gross sensation intact to all lumbar and sacral dermatomes.  Reflexes: 2+ at both patellar tendons, 2+ at achilles tendons, Babinski's downgoing.  Strength at foot  New onset weakness of the left plantarflexion and dorsiflexion of the foot with 4 out of 5 strength compared to the contralateral side  Left knee exam minimal pain at this moment. Patient does have better range of motion. Significant improvement from previous exam        Impression and Recommendations:     This case required medical decision making of moderate  complexity.

## 2016-03-26 ENCOUNTER — Ambulatory Visit (INDEPENDENT_AMBULATORY_CARE_PROVIDER_SITE_OTHER): Payer: Managed Care, Other (non HMO) | Admitting: Family Medicine

## 2016-03-26 ENCOUNTER — Encounter: Payer: Self-pay | Admitting: Family Medicine

## 2016-03-26 ENCOUNTER — Encounter: Payer: Self-pay | Admitting: *Deleted

## 2016-03-26 VITALS — BP 120/82 | HR 87 | Wt 251.0 lb

## 2016-03-26 DIAGNOSIS — M170 Bilateral primary osteoarthritis of knee: Secondary | ICD-10-CM | POA: Diagnosis not present

## 2016-03-26 DIAGNOSIS — M5416 Radiculopathy, lumbar region: Secondary | ICD-10-CM | POA: Diagnosis not present

## 2016-03-26 MED ORDER — TRAMADOL HCL 50 MG PO TABS: 50.0000 mg | ORAL_TABLET | Freq: Two times a day (BID) | ORAL | 0 refills | Status: DC | PRN

## 2016-03-26 MED ORDER — GABAPENTIN 100 MG PO CAPS: 200.0000 mg | ORAL_CAPSULE | Freq: Three times a day (TID) | ORAL | 3 refills | Status: DC

## 2016-03-26 NOTE — Assessment & Plan Note (Signed)
Patient is worsening. Did not respond to the prednisone. Patient is having worsening symptoms at this point and seems to be even having some mild weakness of the lower extremity. Concerned the patient does have a herniated disc. Patient did also have a recent hysterectomy we went to make sure that there is no other type of hematoma that could be contribute in. MRI ordered today for further evaluation. Do not feel contrast is necessary at this time. We discussed icing regimen and patient given a prescription for tramadol. Increase patient's gabapentin to 200 mg 3 times a day. Anti-inflammatory scheduled for the next 6 days. Depending on imaging we will discuss the patient is a candidate for epidural injections or possible surgical intervention.

## 2016-03-26 NOTE — Patient Instructions (Addendum)
Good to see you  Gabapentin 200mg  3 times a day  Duexis 3 times a day for 6 days.  Tramadol up to 2 times a day  We got you a note for work.  We will get the MRI and depending on the images we will discussed next step

## 2016-03-26 NOTE — Assessment & Plan Note (Signed)
Responded well to the injection previously. We'll continue to monitor otherwise.

## 2016-04-01 ENCOUNTER — Telehealth: Payer: Self-pay | Admitting: Family Medicine

## 2016-04-01 MED ORDER — IBUPROFEN-FAMOTIDINE 800-26.6 MG PO TABS: 1.0000 | ORAL_TABLET | Freq: Three times a day (TID) | ORAL | 3 refills | Status: DC | PRN

## 2016-04-01 NOTE — Telephone Encounter (Signed)
Called patient back. Patient was to hold on the MRI with her improving. Responding well to high-dose ibuprofen and was given an prescription for this. We discussed icing regimen and continuing conservative therapy. Patient will come back and see me as scheduled.

## 2016-04-01 NOTE — Telephone Encounter (Signed)
Patient is request a new knee brace.  States the one she has is worn out.  Please follow up.

## 2016-04-04 ENCOUNTER — Inpatient Hospital Stay: Admission: RE | Admit: 2016-04-04 | Payer: Managed Care, Other (non HMO) | Source: Ambulatory Visit

## 2016-04-17 ENCOUNTER — Ambulatory Visit (INDEPENDENT_AMBULATORY_CARE_PROVIDER_SITE_OTHER): Payer: Managed Care, Other (non HMO) | Admitting: Family Medicine

## 2016-04-17 ENCOUNTER — Encounter: Payer: Self-pay | Admitting: Family Medicine

## 2016-04-17 VITALS — BP 145/91 | Ht 66.0 in | Wt 248.0 lb

## 2016-04-17 DIAGNOSIS — R29898 Other symptoms and signs involving the musculoskeletal system: Secondary | ICD-10-CM | POA: Diagnosis not present

## 2016-04-17 DIAGNOSIS — M5416 Radiculopathy, lumbar region: Secondary | ICD-10-CM

## 2016-04-17 MED ORDER — OXYCODONE-ACETAMINOPHEN 5-325 MG PO TABS: 1.0000 | ORAL_TABLET | Freq: Four times a day (QID) | ORAL | 0 refills | Status: DC | PRN

## 2016-04-17 MED ORDER — PREDNISONE 50 MG PO TABS: ORAL_TABLET | ORAL | 0 refills | Status: DC

## 2016-04-17 NOTE — Patient Instructions (Signed)
I am tying the steroid medicine again at a higher dose I have given you some pain medicine for breakthrough pain--be careful as it may make you drwosy--do not drive while using Let's get the MRI done and the go from there

## 2016-04-20 ENCOUNTER — Telehealth: Payer: Self-pay | Admitting: *Deleted

## 2016-04-20 ENCOUNTER — Encounter: Payer: Self-pay | Admitting: Family Medicine

## 2016-04-20 ENCOUNTER — Other Ambulatory Visit: Payer: Self-pay | Admitting: Family Medicine

## 2016-04-20 MED ORDER — GABAPENTIN 100 MG PO CAPS: 200.0000 mg | ORAL_CAPSULE | Freq: Three times a day (TID) | ORAL | 3 refills | Status: DC

## 2016-04-20 NOTE — Telephone Encounter (Signed)
Per Dr Nori Riis, new directions are to take one 50mg  tab daily for 8 days and 1/2 50mg  tab daily for 4 days.... Pt will have some 1/2 tabs left.  Pt agreed to the new dose

## 2016-04-20 NOTE — Telephone Encounter (Signed)
Rec'd fax stating pt insurance plan covers 90 days on medications. Requesting 90 day supply on Gabapentin 100 mg. Is this ok...Johny Chess

## 2016-04-20 NOTE — Progress Notes (Signed)
Barbara Reese - 50 y.o. female MRN [id number]  Date of birth: 1965/11/28    SUBJECTIVE:     Chief Complaint: LEFT  sided buttock and leg pain. Associated with numbness and sensation of weakness.  HPI: She complains of pain in the left  low back, buttock and radiating down the left leg posteriorly to the level of the sole of the foot for the last 2-3 weeks. She's had this problem for some time but in the last week he discussions significantly worse. She was seen by Dr. Tamala Julian in July for similar but not quite as severe pain. They had ordered MR I that for some reason it was not done. She has also taken a 5 day steroid burst and was prescribed gabapentin but found the gabapentin causes some itching. She had some tramadol and some oxycodone on hand from her recent hysterectomy and said the oxycodone helps a little bit the tramadol did not do anything at all. Her pain is 8 out of 10 this morning.  She is concerned because she's having difficulty sitting for any length of time and has just been scheduled to return to work after her hysterectomy so she's used that much of her PAL time.  From Dr. Thompson Caul note 03/13/2016: Patient also states that now she is having pain on the lateral aspect the leg that seems to come from the back. Patient states that she moves a certain way she has a severe pain radiates all way to her foot. Starting have a constant numbness. Does not know if this is related to her knee. Been going on for about 10 days and worsening. Patient states that daily activities are becoming more difficult and sometimes can wake her out of the sleep. Does not remember any true injury. Back was hurting somewhat right after even the surgery   ROS:     No unusual weight change, fever, sweats, chills. She's had no incontinence of bowel or bladder. The right leg occasionally feels somewhat weak and she feels that her gait is soft. She's had no other unusual arthralgias or myalgias, no rash.  PERTINENT   PMH / PSH FH / / SH:  Past Medical, Surgical, Social, and Family History Reviewed & Updated in the EMR.  Pertinent findings include:  Recent hysterectomy Hypertension Arthritis left knee, history of knee surgery. 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 GEN.: Well-developed female, pacing around the rim and some discomfort. She is able to sit and cooperate with the exam but feels more comfortable standing. Hips: Bilaterally full range of motion is painless internal and external rotation. Strength 5 out of 5 in flexion and extension. The bilateral  greater trochanteric area is nontender to palpation. Neuro: Straight leg raise positive on the left at 90 in seated and supine position. 1-2+ DTRs at the ankle and 2+ bilaterally at the knee Plantarflexion dorsiflexion 5/5on the right, 4-5  out of 5 on the left. GAIT: Antalgic. BACK: Nontender to palpation or percussion. Is no appreciable muscle spasm. There is no spinal defect noted on palpation. SKIN: There is no increased erythema, no rash, no unusual lesions in the low back buttock area.  ASSESSMENT & PLAN:  See problem based charting & AVS for pt  instructions.

## 2016-04-20 NOTE — Telephone Encounter (Signed)
Sent in refill

## 2016-04-20 NOTE — Assessment & Plan Note (Signed)
She is describing sciatic-type radiculopathy. I will re-try the steroid dose at a higher dose and for about few days longer. She can use her oxycodone that she has remaining from the surgery for breakthrough pain. We discussed how to use that safely and not drive while under the influence. I will call her once a get the MRI is results back.

## 2016-04-21 NOTE — Telephone Encounter (Signed)
Needs refill on her tramadol. Will call back around 3 today to see if it has been sent in

## 2016-04-22 NOTE — Telephone Encounter (Signed)
2nd request.  Marcin Holte L, RN  

## 2016-04-26 ENCOUNTER — Ambulatory Visit
Admission: RE | Admit: 2016-04-26 | Discharge: 2016-04-26 | Disposition: A | Discharge status: Home / Self Care | Payer: Managed Care, Other (non HMO) | Source: Ambulatory Visit | Attending: Family Medicine | Admitting: Family Medicine

## 2016-04-26 DIAGNOSIS — R29898 Other symptoms and signs involving the musculoskeletal system: Secondary | ICD-10-CM

## 2016-04-28 ENCOUNTER — Telehealth: Payer: Self-pay | Admitting: *Deleted

## 2016-04-28 DIAGNOSIS — M5126 Other intervertebral disc displacement, lumbar region: Secondary | ICD-10-CM

## 2016-04-28 DIAGNOSIS — M5136 Other intervertebral disc degeneration, lumbar region: Secondary | ICD-10-CM

## 2016-04-28 NOTE — Telephone Encounter (Signed)
Pt opted to not choose Neurosurgeon as a treatment but wants to try ESI's.  Will place the order to [facility] and have them call patient to set up the first session

## 2016-04-28 NOTE — Telephone Encounter (Signed)
Barbara Reese The MRi shows a small bulging disc. She has a couple of options: 1> she is on steroids--if taht is improving her sufficiently we can just wait until she completes steroids. If thatpain relief is not enough, then qwe can 2. Send her to neurosurgery 3. Set her up for epidural steroid injections. Please let er know THANKS! Barbara Reese

## 2016-04-29 ENCOUNTER — Other Ambulatory Visit: Payer: Self-pay | Admitting: Family Medicine

## 2016-04-29 DIAGNOSIS — M5126 Other intervertebral disc displacement, lumbar region: Secondary | ICD-10-CM

## 2016-04-29 DIAGNOSIS — M5136 Other intervertebral disc degeneration, lumbar region: Secondary | ICD-10-CM

## 2016-05-15 ENCOUNTER — Other Ambulatory Visit: Payer: Managed Care, Other (non HMO)

## 2016-05-25 ENCOUNTER — Other Ambulatory Visit: Payer: Self-pay | Admitting: Family Medicine

## 2016-05-28 ENCOUNTER — Other Ambulatory Visit: Payer: Self-pay | Admitting: *Deleted

## 2016-05-28 MED ORDER — HYDROCHLOROTHIAZIDE 25 MG PO TABS: 25.0000 mg | ORAL_TABLET | Freq: Every day | ORAL | 0 refills | Status: DC

## 2016-05-28 NOTE — Telephone Encounter (Signed)
Second request

## 2016-06-19 ENCOUNTER — Encounter: Payer: Self-pay | Admitting: Family Medicine

## 2016-06-19 ENCOUNTER — Ambulatory Visit (INDEPENDENT_AMBULATORY_CARE_PROVIDER_SITE_OTHER): Payer: Managed Care, Other (non HMO) | Admitting: Family Medicine

## 2016-06-19 VITALS — BP 150/88 | HR 77 | Temp 98.3°F | Wt 258.0 lb

## 2016-06-19 DIAGNOSIS — J209 Acute bronchitis, unspecified: Secondary | ICD-10-CM | POA: Diagnosis not present

## 2016-06-19 MED ORDER — DEXTROMETHORPHAN-GUAIFENESIN 10-100 MG/5ML PO LIQD: 10.0000 mL | ORAL | 0 refills | Status: DC | PRN

## 2016-06-19 MED ORDER — AMOXICILLIN-POT CLAVULANATE 875-125 MG PO TABS: 1.0000 | ORAL_TABLET | Freq: Two times a day (BID) | ORAL | 0 refills | Status: DC

## 2016-06-19 NOTE — Patient Instructions (Signed)
It was nice to see you, I hope you feel better soon. I have prescribed you an antibiotic to take twice daily. I have also prescribed dextromethorphan-guaifenesin to take every 4 hours as needed for cough/congestion.   Upper Respiratory Infection, Adult Most upper respiratory infections (URIs) are a viral infection of the air passages leading to the lungs. A URI affects the nose, throat, and upper air passages. The most common type of URI is nasopharyngitis and is typically referred to as "the common cold." URIs run their course and usually go away on their own. Most of the time, a URI does not require medical attention, but sometimes a bacterial infection in the upper airways can follow a viral infection. This is called a secondary infection. Sinus and middle ear infections are common types of secondary upper respiratory infections. Bacterial pneumonia can also complicate a URI. A URI can worsen asthma and chronic obstructive pulmonary disease (COPD). Sometimes, these complications can require emergency medical care and may be life threatening.  CAUSES Almost all URIs are caused by viruses. A virus is a type of germ and can spread from one person to another.  RISKS FACTORS You may be at risk for a URI if:   You smoke.   You have chronic heart or lung disease.  You have a weakened defense (immune) system.   You are very young or very old.   You have nasal allergies or asthma.  You work in crowded or poorly ventilated areas.  You work in health care facilities or schools. SIGNS AND SYMPTOMS  Symptoms typically develop 2-3 days after you come in contact with a cold virus. Most viral URIs last 7-10 days. However, viral URIs from the influenza virus (flu virus) can last 14-18 days and are typically more severe. Symptoms may include:   Runny or stuffy (congested) nose.   Sneezing.   Cough.   Sore throat.   Headache.   Fatigue.   Fever.   Loss of appetite.   Pain in  your forehead, behind your eyes, and over your cheekbones (sinus pain).  Muscle aches.  DIAGNOSIS  Your health care provider may diagnose a URI by:  Physical exam.  Tests to check that your symptoms are not due to another condition such as:  Strep throat.  Sinusitis.  Pneumonia.  Asthma. TREATMENT  A URI goes away on its own with time. It cannot be cured with medicines, but medicines may be prescribed or recommended to relieve symptoms. Medicines may help:  Reduce your fever.  Reduce your cough.  Relieve nasal congestion. HOME CARE INSTRUCTIONS   Take medicines only as directed by your health care provider.   Gargle warm saltwater or take cough drops to comfort your throat as directed by your health care provider.  Use a warm mist humidifier or inhale steam from a shower to increase air moisture. This may make it easier to breathe.  Drink enough fluid to keep your urine clear or pale yellow.   Eat soups and other clear broths and maintain good nutrition.   Rest as needed.   Return to work when your temperature has returned to normal or as your health care provider advises. You may need to stay home longer to avoid infecting others. You can also use a face mask and careful hand washing to prevent spread of the virus.  Increase the usage of your inhaler if you have asthma.   Do not use any tobacco products, including cigarettes, chewing tobacco, or electronic cigarettes.  If you need help quitting, ask your health care provider. PREVENTION  The best way to protect yourself from getting a cold is to practice good hygiene.   Avoid oral or hand contact with people with cold symptoms.   Wash your hands often if contact occurs.  There is no clear evidence that vitamin C, vitamin E, echinacea, or exercise reduces the chance of developing a cold. However, it is always recommended to get plenty of rest, exercise, and practice good nutrition.  SEEK MEDICAL CARE IF:    You are getting worse rather than better.   Your symptoms are not controlled by medicine.   You have chills.  You have worsening shortness of breath.  You have brown or red mucus.  You have yellow or brown nasal discharge.  You have pain in your face, especially when you bend forward.  You have a fever.  You have swollen neck glands.  You have pain while swallowing.  You have white areas in the back of your throat. SEEK IMMEDIATE MEDICAL CARE IF:   You have severe or persistent:  Headache.  Ear pain.  Sinus pain.  Chest pain.  You have chronic lung disease and any of the following:  Wheezing.  Prolonged cough.  Coughing up blood.  A change in your usual mucus.  You have a stiff neck.  You have changes in your:  Vision.  Hearing.  Thinking.  Mood. MAKE SURE YOU:   Understand these instructions.  Will watch your condition.  Will get help right away if you are not doing well or get worse.   This information is not intended to replace advice given to you by your health care provider. Make sure you discuss any questions you have with your health care provider.   Document Released: 02/03/2001 Document Revised: 12/25/2014 Document Reviewed: 11/15/2013 Elsevier Interactive Patient Education [employer].

## 2016-06-19 NOTE — Progress Notes (Signed)
Subjective: CC: cough HPI: Patient is a 50 y.o. female  presenting to clinic today for a SDA.  Patient had the flu vaccine on 10/10. The next day she had a sore throat. The cough started 3 days later. Cough is productive of clear thick sputum. She also had nasal congestion. In the morning she has significant nasal congestion that needs to be cleared. She endorses significant post nasal drip, worse at night.  Symptoms have slightly improved, but still pretty significant. The only thing that has improved is the sore throat.  No fevers, facial pain, myalgias  Endorses chills, intermittent R otalgia  She's been taking Mucinex, Nyquil, Alka Seltzer, Ibuprofen, Flonase, sinus medication. Tea with honey and lemon have not improved her symptoms  Social History: works at [employer]. No smoke exposure  Health Maintenance: states she had the flu vaccine at work as well as [place], will bring in records  ROS: All other systems reviewed and are negative.  Past Medical History Patient Active Problem List   Diagnosis Date Noted  . Degenerative arthritis of left knee 03/13/2016  . Lumbar radiculopathy 03/13/2016  . Fibroids 02/18/2016  . Postoperative state 02/18/2016  . 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 Trigger thumb of right hand 01/02/2016  . DUB (dysfunctional uterine bleeding)   . Intermenstrual bleeding 08/08/2015  . Situational anxiety 10/08/2014  . Rectal bleeding 09/18/2014  . Gout of big toe 05/23/2014  . Derang of medial meniscus due to old tear/inj, unsp knee 05/17/2014  . Arthritis of knee, degenerative 10/26/2013  . Hypertension 12/24/2012  . Physical examination of employee 12/01/2012  . Arthritis 12/01/2012  . Elevated LDL cholesterol level 12/16/2011  . Prediabetes 12/16/2011  . Health maintenance examination 11/14/2011  . Depression 11/14/2011  . 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 ANXIETY DISORDER 11/06/2009  . OBESITY, MODERATE 01/04/2009  .  OBSTRUCTIVE SLEEP APNEA 12/28/2008  . 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  . 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 GERD 02/01/2008    Medications- reviewed and updated  Objective: Office vital signs reviewed. BP (!) 150/88   Pulse 77   Temp 98.3 F (36.8 C) (Oral)   Wt 258 lb (117 kg)   LMP 02/09/2016 (Exact Date)   BMI 41.64 kg/m    Physical Examination:  General: Awake, alert, well- nourished, mild distress. ENMT:  TMs intact, normal light reflex, no erythema, no bulging. Nasal turbinates boggy, clear discharge. MMM, Oropharynx with erythema.  Eyes: Conjunctiva non-injected, watery. PERRL.  Cardio: RRR, no m/r/g noted.  Pulm: No increased WOB.  CTAB, without wheezes, rhonchi or crackles noted.  Skin: dry, intact, no rashes or lesions  Assessment/Plan: No problem-specific Assessment & Plan notes found for this encounter. URI: Patient's history is consistent with an upper respiratory infection/bronchitis. Given the duration of her symptoms, I am slightly concerned about a bacterial infection. Overall, she is well appearing on exam. We discussed continuation of ibuprofen or Tylenol as needed. We also discussed continued use of Flonase. I prescribed Augmentin twice a day for 7 day course. I also prescribed Robitussin-DM to help with the cough and phlegm. Return precautions were discussed.  No orders of the defined types were placed in this encounter.   Meds ordered this encounter  Medications  . amoxicillin-clavulanate (AUGMENTIN) 875-125 MG tablet    Sig: Take 1 tablet by mouth 2 (two) times daily.    Dispense:  14 tablet    Refill:  0  . dextromethorphan-guaiFENesin (ROBITUSSIN-DM) 10-100 MG/5ML liquid    Sig: Take 10  mLs by mouth every 4 (four) hours as needed for cough.    Dispense:  118 mL    Refill:  [place] PGY-3, [place]

## 2016-06-25 ENCOUNTER — Telehealth: Payer: Self-pay | Admitting: Family Medicine

## 2016-06-25 MED ORDER — GUAIFENESIN-CODEINE 100-10 MG/5ML PO SOLN: 10.0000 mL | ORAL | 0 refills | Status: DC | PRN

## 2016-06-25 NOTE — Telephone Encounter (Signed)
Will forward to MD last seen for cough.

## 2016-06-25 NOTE — Telephone Encounter (Signed)
Pt called because she is still coughing a lot. This happening at night which is keeping her wake. Can we call in something that would stop the cough and possibly help her sleep. Please call her at work which is the [employer]. She is using [facility] on [address].

## 2016-06-25 NOTE — Telephone Encounter (Signed)
Called patient back. Discussed her continued cough that is keeping her up at night. She notes worsening post-nasal drip as well. Just wants something to help with cough interfering with sleep.   Called in guaifenisesin=codeine, 64mL q4hrs PRN cough. Noted that it may make her drowsy, instructed her to take this at a time when she is not going to operate heavy machinery.  Archie Patten, MD [facility] Resident  06/25/2016, 2:21 PM

## 2016-09-01 ENCOUNTER — Ambulatory Visit (INDEPENDENT_AMBULATORY_CARE_PROVIDER_SITE_OTHER): Payer: Managed Care, Other (non HMO) | Admitting: Family Medicine

## 2016-09-01 VITALS — BP 138/76 | HR 78 | Temp 98.4°F | Ht 66.0 in | Wt 259.0 lb

## 2016-09-01 DIAGNOSIS — I10 Essential (primary) hypertension: Secondary | ICD-10-CM | POA: Diagnosis not present

## 2016-09-01 DIAGNOSIS — Z114 Encounter for screening for human immunodeficiency virus [HIV]: Secondary | ICD-10-CM | POA: Diagnosis not present

## 2016-09-01 DIAGNOSIS — M5416 Radiculopathy, lumbar region: Secondary | ICD-10-CM

## 2016-09-01 DIAGNOSIS — M170 Bilateral primary osteoarthritis of knee: Secondary | ICD-10-CM

## 2016-09-01 NOTE — Patient Instructions (Addendum)
On your way out, schedule an appointment one morning to come back for fasting labs. Do not eat or drink anything other than water the morning of your lab appointment until after your labs are drawn.  Referring to back surgeon  Filled out FMLA papers for knees today  Add tylenol 500mg  twice daily to your knee medication regimen Schedule follow up with sports medicine for knee injections  See the handout on how to schedule your colonoscopy. This is an important screening test for colon cancer.   Continue blood pressure medication. See me again in 3 months   Be well, Dr. [name]

## 2016-09-01 NOTE — Progress Notes (Signed)
Date of Visit: 09/01/2016   HPI:  Patient presents for follow up of knee arthritis and to have FMLA paperwork completed.  Knee arthritis - taking duexis (ibuprofen-famotidine) once daily. Having lots of pain. Not taking tramadol anymore as it did not help. Missing about 3 days of work per month due to arthritis flareups. Has followed intermittently with sports medicine. No knee injections in a while - willing to follow up with them to get these done.  Back issues - saw Dr. Tamala Julian & Nori Riis at sports medicine for back pain. Had MRI which showed disc herniation on the left, with severe facet arthropathy. Having some radicular symptoms. Also has cyst in back which patient believes may be contributing. Wants to know if she should have it removed. Dr. Nori Riis had advised referral to neurosurgery to contemplate a surgical procedure versus epidural steroid injection. Patient has not followed through with this, she is nervous about injections or surgery but is willing to see neurosurgery. Denies having fever, saddle anesthesia, lower extremity weakness, or problems with stooling or urination.   Hypertension - taking HCTZ 25mg  daily. Tolerating this well. Denies chest pain, shortness of breath, swelling.  GERD - takes protonix and famotodine. Well controlled on this regimen.  ROS: See HPI.  [place]: history of obesity, GERD, depression, hypertension, knee arthritis  PHYSICAL EXAM: BP 138/76   Pulse 78   Temp 98.4 F (36.9 C) (Oral)   Ht 5\' 6"  (1.676 m)   Wt 259 lb (117.5 kg)   LMP 02/09/2016 (Exact Date)   BMI 41.80 kg/m  Gen: NAD, pleasant, cooperative HEENT: normocephalic, atraumatic, moist mucous membranes  Heart: regular rate and rhythm, no murmur Lungs: clear to auscultation bilaterally normal work of breathing  Neuro: alert grossly nonfocal, speech normal Ext: No appreciable lower extremity edema bilaterally. Full strength bilateral lower extremities Back: 3cm mobile subcutaneous nodule  nontender to palpation (though back diffusely tender to palpation in musculature)  ASSESSMENT/PLAN:  Health maintenance:  -will check HIV antibody with labs when she returns -got flu shot at work -given colonoscopy handout (due now that she is 50)  Lumbar radiculopathy Patient wondering whether cyst on back is contributing. Explained to her that I doubt this to be the case. Reviewed MRI which demonstrates like sebaceous cyst, consistent with clinical findings. After discussion with patient will refer to back surgeon to discuss options.  Arthritis of knee, degenerative Overall stable. Encouraged to follow up with sports medicine for steroid injections. Completed FMLA paperwork today, will scan copy into chart.  Hypertension Well controlled. Continue current regimen. To return for fasting labs - CMET, lipids.   FOLLOW UP: Follow up in 3 months with me for above issues Keep follow up with sports medicine Referring to back surgery  [place] J. [name], [place]

## 2016-09-02 NOTE — Assessment & Plan Note (Signed)
Patient wondering whether cyst on back is contributing. Explained to her that I doubt this to be the case. Reviewed MRI which demonstrates like sebaceous cyst, consistent with clinical findings. After discussion with patient will refer to back surgeon to discuss options.

## 2016-09-02 NOTE — Assessment & Plan Note (Signed)
Overall stable. Encouraged to follow up with sports medicine for steroid injections. Completed FMLA paperwork today, will scan copy into chart.

## 2016-09-02 NOTE — Assessment & Plan Note (Signed)
Well controlled. Continue current regimen. To return for fasting labs - CMET, lipids.

## 2016-09-04 ENCOUNTER — Other Ambulatory Visit: Payer: Managed Care, Other (non HMO)

## 2016-09-04 DIAGNOSIS — Z114 Encounter for screening for human immunodeficiency virus [HIV]: Secondary | ICD-10-CM

## 2016-09-04 DIAGNOSIS — I10 Essential (primary) hypertension: Secondary | ICD-10-CM

## 2016-09-04 LAB — COMPLETE METABOLIC PANEL WITH GFR
ALT: 11 U/L (ref 6–29)
AST: 16 U/L (ref 10–35)
Albumin: 4.1 g/dL (ref 3.6–5.1)
Alkaline Phosphatase: 43 U/L (ref 33–130)
BILIRUBIN TOTAL: 0.8 mg/dL (ref 0.2–1.2)
BUN: 10 mg/dL (ref 7–25)
CHLORIDE: 101 mmol/L (ref 98–110)
CO2: 29 mmol/L (ref 20–31)
Calcium: 9.8 mg/dL (ref 8.6–10.4)
Creat: 0.97 mg/dL (ref 0.50–1.05)
GFR, EST AFRICAN AMERICAN: 79 mL/min (ref >=60)
GFR, EST NON AFRICAN AMERICAN: 68 mL/min (ref >=60)
GLUCOSE: 113 mg/dL — AB (ref 65–99)
POTASSIUM: 4.1 mmol/L (ref 3.5–5.3)
SODIUM: 139 mmol/L (ref 135–146)
Total Protein: 7.8 g/dL (ref 6.1–8.1)

## 2016-09-04 LAB — LIPID PANEL
CHOL/HDL RATIO: 2.9 ratio (ref <5.0)
Cholesterol: 211 mg/dL — ABNORMAL HIGH (ref <200)
HDL: 72 mg/dL (ref >50)
LDL CALC: 121 mg/dL — AB (ref <100)
TRIGLYCERIDES: 88 mg/dL (ref <150)
VLDL: 18 mg/dL (ref <30)

## 2016-09-05 LAB — HIV ANTIBODY (ROUTINE TESTING W REFLEX): HIV 1&2 Ab, 4th Generation: NONREACTIVE

## 2016-09-19 ENCOUNTER — Other Ambulatory Visit: Payer: Self-pay | Admitting: Family Medicine

## 2016-09-19 ENCOUNTER — Encounter (HOSPITAL_COMMUNITY): Payer: Self-pay

## 2016-09-19 ENCOUNTER — Emergency Department (HOSPITAL_COMMUNITY)
Admission: EM | Admit: 2016-09-19 | Discharge: 2016-09-19 | Disposition: A | Discharge status: Home / Self Care | Payer: Managed Care, Other (non HMO) | Attending: Emergency Medicine | Admitting: Emergency Medicine

## 2016-09-19 DIAGNOSIS — Y999 Unspecified external cause status: Secondary | ICD-10-CM | POA: Diagnosis not present

## 2016-09-19 DIAGNOSIS — W19XXXA Unspecified fall, initial encounter: Secondary | ICD-10-CM | POA: Insufficient documentation

## 2016-09-19 DIAGNOSIS — Z79899 Other long term (current) drug therapy: Secondary | ICD-10-CM | POA: Diagnosis not present

## 2016-09-19 DIAGNOSIS — I10 Essential (primary) hypertension: Secondary | ICD-10-CM | POA: Insufficient documentation

## 2016-09-19 DIAGNOSIS — M5432 Sciatica, left side: Secondary | ICD-10-CM | POA: Insufficient documentation

## 2016-09-19 DIAGNOSIS — Y939 Activity, unspecified: Secondary | ICD-10-CM | POA: Insufficient documentation

## 2016-09-19 DIAGNOSIS — Y929 Unspecified place or not applicable: Secondary | ICD-10-CM | POA: Insufficient documentation

## 2016-09-19 DIAGNOSIS — M461 Sacroiliitis, not elsewhere classified: Secondary | ICD-10-CM

## 2016-09-19 DIAGNOSIS — M545 Low back pain: Secondary | ICD-10-CM | POA: Diagnosis present

## 2016-09-19 DIAGNOSIS — R739 Hyperglycemia, unspecified: Secondary | ICD-10-CM

## 2016-09-19 LAB — URINALYSIS, ROUTINE W REFLEX MICROSCOPIC
Bilirubin Urine: NEGATIVE
GLUCOSE, UA: NEGATIVE mg/dL
Hgb urine dipstick: NEGATIVE
Ketones, ur: NEGATIVE mg/dL
LEUKOCYTES UA: NEGATIVE
Nitrite: NEGATIVE
PROTEIN: NEGATIVE mg/dL
Specific Gravity, Urine: 1.025 (ref 1.005–1.030)
pH: 5 (ref 5.0–8.0)

## 2016-09-19 MED ORDER — CYCLOBENZAPRINE HCL 10 MG PO TABS: 10.0000 mg | ORAL_TABLET | Freq: Two times a day (BID) | ORAL | 0 refills | Status: DC | PRN

## 2016-09-19 MED ORDER — HYDROCODONE-ACETAMINOPHEN 5-325 MG PO TABS
1.0000 | ORAL_TABLET | Freq: Once | ORAL | Status: AC
Start: 2016-09-19 — End: 2016-09-19
  Administered 2016-09-19: 1 via ORAL
  Filled 2016-09-19: qty 1

## 2016-09-19 MED ORDER — IBUPROFEN-FAMOTIDINE 800-26.6 MG PO TABS: 1.0000 | ORAL_TABLET | Freq: Three times a day (TID) | ORAL | 3 refills | Status: DC | PRN

## 2016-09-19 NOTE — Discharge Instructions (Signed)
You were seen today for back pain. This is likely a combination of sciatica and left SI joint inflammation. Increase your ibuprofen to 3 times daily for 3-5 days. You will also be started on muscle relaxant. Follow-up with her primary physician and neurosurgery as scheduled.

## 2016-09-19 NOTE — ED Provider Notes (Signed)
[facility] DEPT Provider Note   CSN: [id number] Arrival date & time: 09/19/16  1019  By signing my name below, I, Jaquelyn Bitter., attest that this documentation has been prepared under the direction and in the presence of Merryl Hacker, MD. Electronically signed: Jaquelyn Bitter., ED Scribe. 09/19/16. 1:45 PM.    History   Chief Complaint No chief complaint on file.   HPI  Barbara Reese is a 51 y.o. female with hx of chronic back pain, GERD and HTN who presents to the Emergency Department complaining of mild to moderate lower back pain with sudden onset x2 weeks ago s/p mechanical fall. Pt reportedly fell x2 weeks ago onto her back but reports that she has had constant back past for the past x2 months. Pt rates the pain 10/10. Pt was seen by her PCP prior to the fall and had MRI which showed bulging disc. She has been referred to neurosurgery. Pt reports lower back pain and tingling in the L leg. Pt's pain is reproducible with leg movement. Pt has taken 800mg  Ibuprofen and 500mg  Tylenol with mild relief. She has taken Tramadol with no relief. Pt denies dysuria, hematuria, weakness, numbness, difficulty urinating. Of note, pt reports family hx of diabetes mellitus. She denies hx of diabetes, heart disease or lung disease. She denies steroid use, physical therapy for the back pain.    The history is provided by the patient. No language interpreter was used.    Past Medical History:  Diagnosis Date  . Allergic rhinitis   . Anemia   . Anxiety   . Depressive disorder    Hx  . 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 GERD (gastroesophageal reflux disease)   . Hepatitis    age 62- dx'd with chronic hepatitis- no problems as an adult  . Hypertension   . IBS (irritable bowel syndrome)    diet controlled, no meds  . Insomnia   . 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 OSA (obstructive sleep apnea)    Does not use CPAP.  Marland Kitchen Osteoarthritis    knees  . PONV (postoperative nausea and vomiting)   . Shortness of breath dyspnea    on exertion  . SVD (spontaneous vaginal delivery)    x 1    Patient Active Problem List   Diagnosis Date Noted  . Degenerative arthritis of left knee 03/13/2016  . Lumbar radiculopathy 03/13/2016  . Fibroids 02/18/2016  . 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 Trigger thumb of right hand 01/02/2016  . DUB (dysfunctional uterine bleeding)   . Intermenstrual bleeding 08/08/2015  . Situational anxiety 10/08/2014  . Rectal bleeding 09/18/2014  . Gout of big toe 05/23/2014  . Derang of medial meniscus due to old tear/inj, unsp knee 05/17/2014  . Arthritis of knee, degenerative 10/26/2013  . Hypertension 12/24/2012  . Physical examination of employee 12/01/2012  . Arthritis 12/01/2012  . Elevated LDL cholesterol level 12/16/2011  . Prediabetes 12/16/2011  . Depression 11/14/2011  . 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 ANXIETY DISORDER 11/06/2009  . OBESITY, MODERATE 01/04/2009  . OBSTRUCTIVE SLEEP APNEA 12/28/2008  . 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  . 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 GERD 02/01/2008    Past Surgical History:  Procedure Laterality Date  . CHOLECYSTECTOMY  1987  . CYSTOSCOPY  02/18/2016   Procedure: CYSTOSCOPY;  Surgeon: Lavonia Drafts, MD;  Location: [facility];  Service: Gynecology;;  .  HYSTEROSCOPY W/D&C N/A 12/30/2015   Procedure: DILATATION AND CURETTAGE /HYSTEROSCOPY;  Surgeon: Mora Bellman, MD;  Location: [facility];  Service: Gynecology;  Laterality: N/A;  . KNEE SURGERY  2004   left knee- Dr. Percell Miller  . ROBOTIC ASSISTED TOTAL HYSTERECTOMY WITH SALPINGECTOMY N/A 02/18/2016   Procedure: ROBOTIC ASSISTED TOTAL HYSTERECTOMY WITH SALPINGECTOMY;  Surgeon: Lavonia Drafts, MD;  Location: [facility];  Service: Gynecology;  Laterality: N/A;  . UPPER GI ENDOSCOPY     gerd    OB  History    No data available       Home Medications    Prior to Admission medications   Medication Sig Start Date End Date Taking? Authorizing Provider  cetirizine (ZYRTEC) 10 MG tablet Take 10 mg by mouth daily as needed for allergies.    Historical Provider, MD  clobetasol ointment (TEMOVATE) 0.05 % Apply topically daily as needed. Patient taking differently: Apply 1 application topically daily as needed (skin irritation).  02/03/16   [name], MD  cyclobenzaprine (FLEXERIL) 10 MG tablet Take 1 tablet (10 mg total) by mouth 2 (two) times daily as needed for muscle spasms. 09/19/16   Merryl Hacker, MD  fluticasone (FLONASE) 50 MCG/ACT nasal spray Place 1 spray into both nostrils daily. Patient taking differently: Place 2 sprays into both nostrils daily as needed for allergies.  02/15/14   [name], MD  hydrochlorothiazide (HYDRODIURIL) 25 MG tablet Take 1 tablet (25 mg total) by mouth daily. 05/28/16   [name], MD  Ibuprofen-Famotidine 800-26.6 MG TABS Take 1 tablet by mouth 3 (three) times daily as needed. 09/19/16   Merryl Hacker, MD  pantoprazole (PROTONIX) 40 MG tablet TAKE 1 TABLET (40 MG TOTAL) BY MOUTH DAILY. 04/30/16   [name], MD  Prenatal Vit-Fe Fumarate-FA (PRENATAL MULTIVITAMIN) TABS tablet Take 1 tablet by mouth daily at 12 noon.    Historical Provider, MD    Family History Family History  Problem Relation Age of Onset  . Osteoarthritis Mother   . Hypertension Mother   . Allergies Mother   . Rheum arthritis Mother   . Diabetes Father   . Hypertension Sister   . Heart attack Brother   . Heart attack Sister   . Allergies Son   . Allergies Sister   . Asthma Son   . Clotting disorder Sister     Social History Social History  Substance Use Topics  . Smoking status: Never Smoker  . Smokeless tobacco: Never Used  . Alcohol use 1.2 oz/week    2 Glasses of wine per week     Comment: wine      Allergies    Gabapentin   Review of Systems Review of Systems  Constitutional: Negative for fever.  Gastrointestinal: Negative for nausea and vomiting.  Genitourinary: Negative for difficulty urinating, dysuria and hematuria.  Musculoskeletal: Positive for back pain.  Neurological: Negative for weakness and numbness.  All other systems reviewed and are negative.    Physical Exam Updated Vital Signs BP 145/94 (BP Location: Left Arm)   Pulse 67   Temp 98.5 F (36.9 C) (Oral)   Resp 24   Ht 5\' 6"  (1.676 m)   Wt 248 lb (112.5 kg)   LMP 02/09/2016 (Exact Date)   SpO2 100%   BMI 40.03 kg/m   Physical Exam  Constitutional: She is oriented to person, place, and time. She appears well-developed and well-nourished.  obese  HENT:  Head: Normocephalic and atraumatic.  Cardiovascular: Normal  rate and regular rhythm.   Pulmonary/Chest: Effort normal. No respiratory distress.  Abdominal: Soft. There is no tenderness.  Musculoskeletal:  No tenderness to palpation, step-off or deformity along the midline thoracic or lower lumbar spine, tenderness palpation left SI joint, positive left straight leg raise  Neurological: She is alert and oriented to person, place, and time.  5 out of 5 strength bilateral lower extremities, no clonus, normal reflexes bilaterally  Skin: Skin is warm and dry.  Psychiatric: She has a normal mood and affect.  Nursing note and vitals reviewed.    ED Treatments / Results   DIAGNOSTIC STUDIES: Oxygen Saturation is 100% on RA, normal by my interpretation.   COORDINATION OF CARE: 1:45 PM-Discussed next steps with pt. Pt verbalized understanding and is agreeable with the plan.    Labs (all labs ordered are listed, but only abnormal results are displayed) Labs Reviewed  URINALYSIS, ROUTINE W REFLEX MICROSCOPIC    EKG  EKG Interpretation None       Radiology No results found.  Procedures Procedures (including critical care time)  Medications Ordered  in ED Medications  HYDROcodone-acetaminophen (NORCO/VICODIN) 5-325 MG per tablet 1 tablet (1 tablet Oral Given 09/19/16 1231)     Initial Impression / Assessment and Plan / ED Course  I have reviewed the triage vital signs and the nursing notes.  Pertinent labs & imaging results that were available during my care of the patient were reviewed by me and considered in my medical decision making (see chart for details).     Patient presents with acute on chronic back pain. History of sciatic-like pain with MRI showing bulging disc. She also reports a mechanical fall 2 weeks ago. On physical exam she has left SI joint pain. She is neurologically intact. No red flags. Patient given Flexeril and ibuprofen. She request urinalysis as "my urine has a funny smell." This is negative.She has only been taking her ibuprofen 1 time daily. Increase to 3 times daily for the next 3-5 days. She will also be given Flexeril for muscle relaxant. Feel patient's symptoms are likely secondary to SI joint inflammation and sciatica. No indication at this time for imaging Follow-up with primary physician and neurosurgery as scheduled.  After history, exam, and medical workup I feel the patient has been appropriately medically screened and is safe for discharge home. Pertinent diagnoses were discussed with the patient. Patient was given return precautions.   Final Clinical Impressions(s) / ED Diagnoses   Final diagnoses:  Sciatica of left side  SI (sacroiliac) joint inflammation (HCC)    New Prescriptions New Prescriptions   CYCLOBENZAPRINE (FLEXERIL) 10 MG TABLET    Take 1 tablet (10 mg total) by mouth 2 (two) times daily as needed for muscle spasms.   I personally performed the services described in this documentation, which was scribed in my presence. The recorded information has been reviewed and is accurate.     Merryl Hacker, MD 09/19/16 [phone]

## 2016-09-19 NOTE — ED Notes (Signed)
Declined W/C at D/C and was escorted to lobby by RN. 

## 2016-09-19 NOTE — ED Triage Notes (Signed)
Patient complains of chronic lower back pain for months and then fell in snow and having increased pain with movement.

## 2016-09-19 NOTE — ED Triage Notes (Signed)
PT reports pain in her lower back after she fell in the recent snow fall. Pt has been taking advil with out relief . Pt reports she had a MRI in past that showed a bulging disc in back.

## 2016-09-24 NOTE — Telephone Encounter (Signed)
Red team, can you clarify with patient what dose of this medication she is on? This request is for 12.5mg  but I have her on a 25mg  tablet Want to be sure I send in the right rx Thanks [name], MD

## 2016-09-25 NOTE — Telephone Encounter (Signed)
LMOVM for pt to call us back. See message below. Deseree Blount, CMA  

## 2016-09-25 NOTE — Telephone Encounter (Signed)
Pt states she needs the 25mg  tablet. Pt would also like a call back about her labs, she can see the results on mychart, but she has some questions. Please call pt at work [phone]. ep

## 2016-09-28 MED ORDER — HYDROCHLOROTHIAZIDE 25 MG PO TABS: 25.0000 mg | ORAL_TABLET | Freq: Every day | ORAL | 3 refills | Status: DC

## 2016-09-28 NOTE — Telephone Encounter (Signed)
rx sent in Called patient & discussed labs 10 year ASCVD risk calculated <5% - no need for statin Recommend she get A1c checked for mildly elevated fasting glucose. Order entered Patient appreciative [name], MD

## 2016-10-09 ENCOUNTER — Ambulatory Visit
Admission: RE | Admit: 2016-10-09 | Discharge: 2016-10-09 | Disposition: A | Discharge status: Home / Self Care | Payer: Managed Care, Other (non HMO) | Source: Ambulatory Visit | Attending: Family Medicine | Admitting: Family Medicine

## 2016-10-09 DIAGNOSIS — M5136 Other intervertebral disc degeneration, lumbar region: Secondary | ICD-10-CM

## 2016-10-09 DIAGNOSIS — M5126 Other intervertebral disc displacement, lumbar region: Secondary | ICD-10-CM

## 2016-10-09 MED ORDER — IOPAMIDOL (ISOVUE-M 200) INJECTION 41%
1.0000 mL | Freq: Once | INTRAMUSCULAR | Status: AC
  Administered 2016-10-09: 1 mL via EPIDURAL

## 2016-10-09 MED ORDER — METHYLPREDNISOLONE ACETATE 40 MG/ML INJ SUSP (RADIOLOG
120.0000 mg | Freq: Once | INTRAMUSCULAR | Status: AC
  Administered 2016-10-09: 120 mg via EPIDURAL

## 2016-10-09 NOTE — Discharge Instructions (Signed)

## 2016-11-05 ENCOUNTER — Other Ambulatory Visit: Payer: Self-pay | Admitting: Family Medicine

## 2016-11-16 ENCOUNTER — Telehealth: Payer: Self-pay | Admitting: *Deleted

## 2016-11-16 NOTE — Telephone Encounter (Signed)
Did you want to prescribe something for pain? You haven't seen her since last August and she had the [facility] on 10/09/16

## 2016-11-16 NOTE — Telephone Encounter (Signed)
-----   Message from Barbara Reese sent at 11/16/2016  9:35 AM EDT ----- Regarding: med request Contact: [phone] Pt states she is having severe back  pain x several days, she is asking for pain meds. The epidural worked for about 1 month. [facility] on [address]

## 2016-11-17 NOTE — Telephone Encounter (Signed)
She needs to see her PCP for for discussion of pain management which I do not do. THANKS! Dorcas Mcmurray

## 2016-11-17 NOTE — Telephone Encounter (Signed)
Patient called back again today asking for other pain meds, saying she is taking 2 tramadol and 2 flexeril and 1 ibuprofen 800mg , a day without any relief. The injection helped some, but doesn't like injections and wouldn't want another one for at least a couple more months.  Not sure what other medicine or route you want to take.

## 2016-11-18 ENCOUNTER — Telehealth: Payer: Self-pay | Admitting: Family Medicine

## 2016-11-18 NOTE — Telephone Encounter (Signed)
Please let patient know she'd need to come in to be seen for this - can't just prescribe pain medication over the phone.  Thanks [name], MD

## 2016-11-18 NOTE — Telephone Encounter (Signed)
Pt would like something for back pain. Pt states she had a back injection on Feb. 15th, it helped, but it has worn off. Pt says tramadol and flexeril don't help with her pain. ep

## 2016-11-19 NOTE — Telephone Encounter (Signed)
Spoke with patient and she made an appointment this morning to be seen next week for her back pain.  Jazmin Hartsell,CMA

## 2016-11-25 ENCOUNTER — Encounter: Payer: Self-pay | Admitting: Family Medicine

## 2016-11-25 ENCOUNTER — Ambulatory Visit (INDEPENDENT_AMBULATORY_CARE_PROVIDER_SITE_OTHER): Payer: 59 | Admitting: Family Medicine

## 2016-11-25 VITALS — BP 152/90 | HR 109 | Temp 97.7°F | Ht 66.0 in | Wt 263.0 lb

## 2016-11-25 DIAGNOSIS — M5416 Radiculopathy, lumbar region: Secondary | ICD-10-CM | POA: Diagnosis not present

## 2016-11-25 NOTE — Progress Notes (Signed)
Subjective:   Patient ID: Barbara Reese    DOB: [birth year], 51 y.o. female   MRN: [id number]  CC: back pain   HPI: Barbara Reese is a 51 y.o. female who presents to clinic today for back pain.   Reports back pain since August 2017.  Had hysterectomy in June 2017.  Scheduled to go back to work August 1st and had excruciating back pain upon returning to work.  Saw Dr. [name], who referred to neurosurgeon. MRI on 9/3 with herniated disc.  Referred to neurosurgery and had injection last month 2/16 by IR.  Shot helped relieve the pain but 1.5 week ago pain came back .  Has been trying to deal with it but getting worse.    Reports neurosurgery wanted to do operate on it because the disc herniation is large but she is afraid to go through with it.  PT was another option presented to her but work schedule is 7.15-4.15 so unable to attend during regular work hours. She is adamant against surgery.  Has been taking Tramadol and Flexeril prescribed by neurosurgery and not helping.  Also taking 800 mg Ibuprofen/Famotidine which she has for knee pain, ordered by Sports Medicine doctor.    Has only had 1 injection so far. Told her she may have to have 2.   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  Nothing she is currently taking is hurting.  LBP shoots down L leg to the foot causing  L foot numbness and tingling.  Has tried Gabapentin which made her break out in a rash.  Has also tried Mobic (makes her nauseated).  Tramadol and flexeril have not worked and she is wondering what her options are besides surgery.   ROS: Denies fevers, chills, nausea, vomiting, abdominal pain, shortness of breath.   Omega: Pertinent past medical, surgical, family, and social history were reviewed and updated as appropriate. Smoking status reviewed.  Medications reviewed.  Objective:   BP (!) 152/90   Pulse (!) 109   Temp 97.7 F (36.5 C) (Oral)   Ht 5\' 6"  (1.676 m)   Wt 263 lb (119.3 kg)   LMP  02/09/2016 (Exact Date)   BMI 42.45 kg/m  Vitals and nursing note reviewed.  General: obese 51 yo F, in no acute distress, appears anxious HEENT: NCAT, MMM, o/p clear  Neck: supple, non-tender without lymphadenopathy CV: RRR no MRG  Lungs: CTA B/L with normal work of breathing  Abdomen: soft, non-tender,+bs Skin: warm, dry Neuro: AAOx3, 5/5 strength bilateral LE, normal reflexes bilaterally, straight leg test is negative Extremities: warm and well perfused, normal tone  Assessment & Plan:   Left Sciatic nerve pain With no red flags on physical exam.  Strength and sensation intact bilaterally.  Patient has failed conservative therapy and surgery is being considered. However patient does not wish to have surgery.  Precepted with Dr. Andria Frames and agree that long term pain control without narcotics would be a good goal for her.  -Physical therapy options discussed with patient however unable to accommodate with her work schedule. -Discussed benefit of starting Cymbalta, Lyrica or Amitriptyline for neuropathic pain but patient adamant she does not want to be on an antidepressant.  When explained that these medications can be used for pain, she states she does not want to take a medication she would need to take daily and would like a PRN type medicine.  Patient grew increasingly frustrated throughout this appointment and left the visit early.  Lovenia Kim, MD [place], PGY-1 12/01/2016 11:33 PM

## 2016-12-22 ENCOUNTER — Other Ambulatory Visit: Payer: Self-pay | Admitting: Family Medicine

## 2016-12-22 DIAGNOSIS — Z1231 Encounter for screening mammogram for malignant neoplasm of breast: Secondary | ICD-10-CM

## 2016-12-29 ENCOUNTER — Encounter (HOSPITAL_COMMUNITY): Payer: Self-pay | Admitting: Emergency Medicine

## 2016-12-29 ENCOUNTER — Ambulatory Visit (HOSPITAL_COMMUNITY)
Admission: EM | Admit: 2016-12-29 | Discharge: 2016-12-29 | Disposition: A | Discharge status: Home / Self Care | Payer: Managed Care, Other (non HMO) | Attending: Emergency Medicine | Admitting: Emergency Medicine

## 2016-12-29 DIAGNOSIS — I10 Essential (primary) hypertension: Secondary | ICD-10-CM

## 2016-12-29 DIAGNOSIS — J029 Acute pharyngitis, unspecified: Secondary | ICD-10-CM | POA: Diagnosis present

## 2016-12-29 DIAGNOSIS — J069 Acute upper respiratory infection, unspecified: Secondary | ICD-10-CM | POA: Diagnosis not present

## 2016-12-29 LAB — POCT RAPID STREP A: STREPTOCOCCUS, GROUP A SCREEN (DIRECT): NEGATIVE

## 2016-12-29 NOTE — ED Provider Notes (Signed)
CSN: [id number]     Arrival date & time 12/29/16  1607 History   None    Chief Complaint  Patient presents with  . Headache  . Sore Throat   (Consider location/radiation/quality/duration/timing/severity/associated sxs/prior Treatment) The history is provided by the patient. No language interpreter was used.  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 Allergic rhinitis   . Anemia   . Anxiety   . Depressive disorder    Hx  . 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 GERD (gastroesophageal reflux disease)   . Hepatitis    age 20- dx'd with chronic hepatitis- no problems as an adult  . Hypertension   . IBS (irritable bowel syndrome)    diet controlled, no meds  . Insomnia   . 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 OSA (obstructive sleep apnea)    Does not use CPAP.  Marland Kitchen Osteoarthritis    knees  . PONV (postoperative nausea and vomiting)   . Shortness of breath dyspnea    on exertion  . SVD (spontaneous vaginal delivery)    x 1   Past Surgical History:  Procedure Laterality Date  . CHOLECYSTECTOMY  1987  . CYSTOSCOPY  02/18/2016   Procedure: CYSTOSCOPY;  Surgeon: Lavonia Drafts, MD;  Location: [facility];  Service: Gynecology;;  . HYSTEROSCOPY W/D&C N/A 12/30/2015   Procedure: DILATATION AND CURETTAGE [place];  Surgeon: Mora Bellman, MD;  Location: [facility];  Service: Gynecology;  Laterality: N/A;  . KNEE SURGERY  2004   left knee- Dr. Percell Miller  .  ROBOTIC ASSISTED TOTAL HYSTERECTOMY WITH SALPINGECTOMY N/A 02/18/2016   Procedure: ROBOTIC ASSISTED TOTAL HYSTERECTOMY WITH SALPINGECTOMY;  Surgeon: Lavonia Drafts, MD;  Location: [facility];  Service: Gynecology;  Laterality: N/A;  . UPPER GI ENDOSCOPY     gerd   Family History  Problem Relation Age of Onset  . Osteoarthritis Mother   . Hypertension Mother   . Allergies Mother   . Rheum arthritis Mother   . Diabetes Father   . Hypertension Sister   . Heart attack Brother   . Heart attack Sister   . Allergies Son   . Allergies Sister   . Asthma Son   . Clotting disorder Sister    Social History  Substance Use Topics  . Smoking status: Never Smoker  . Smokeless tobacco: Never Used  . Alcohol use 1.2 oz/week    2 Glasses of wine per week     Comment: wine    OB History    No data available     Review of Systems  Constitutional: Negative for fever.  HENT: Positive for congestion, sinus pressure, sore throat and voice change.   Respiratory: Positive for cough.   Neurological: Positive for headaches.  All other systems reviewed and are negative.   Allergies  Gabapentin  Home Medications   Prior to Admission medications   Medication Sig Start Date End Date Taking? Authorizing Provider  cetirizine (ZYRTEC) 10 MG tablet Take 10 mg by  mouth daily as needed for allergies.   Yes [provider]  fluticasone (FLONASE) 50 MCG/ACT nasal spray Place 1 spray into both nostrils daily. Patient taking differently: Place 2 sprays into both nostrils daily as needed for allergies.  02/15/14  Yes [name], MD  hydrochlorothiazide (HYDRODIURIL) 25 MG tablet Take 1 tablet (25 mg total) by mouth daily. 09/28/16  Yes [name], MD  Ibuprofen-Famotidine 800-26.6 MG TABS Take 1 tablet by mouth 3 (three) times daily as needed. 09/19/16  Yes Horton, Barbette Hair, MD  pantoprazole (PROTONIX) 40 MG tablet TAKE 1 TABLET (40 MG TOTAL) BY MOUTH DAILY. 11/05/16  Yes  [name], MD   Meds Ordered and Administered this Visit  Medications - No data to display  BP (!) 145/90 (BP Location: Left Arm)   Pulse 91   Temp 98.3 F (36.8 C) (Oral)   Resp 18   LMP 02/09/2016 (Exact Date)   SpO2 100%  No data found.   Physical Exam  Constitutional: She is oriented to person, place, and time. She appears well-developed and well-nourished. She is active and cooperative. No distress.  HENT:  Head: Normocephalic.  Right Ear: Tympanic membrane is retracted.  Left Ear: Tympanic membrane is retracted.  Nose: Mucosal edema present. No sinus tenderness.  Mouth/Throat: Uvula is midline, oropharynx is clear and moist and mucous membranes are normal.  Eyes: Conjunctivae, EOM and lids are normal. Pupils are equal, round, and reactive to light.  Neck: Trachea normal and normal range of motion. No tracheal deviation present.  Cardiovascular: Regular rhythm, normal heart sounds and normal pulses.   No murmur heard. Pulmonary/Chest: Effort normal and breath sounds normal.  Abdominal: Soft. Bowel sounds are normal. There is no tenderness.  Musculoskeletal: Normal range of motion.  Lymphadenopathy:    She has no cervical adenopathy.  Neurological: She is alert and oriented to person, place, and time. GCS eye subscore is 4. GCS verbal subscore is 5. GCS motor subscore is 6.  Skin: Skin is warm and dry. No rash noted.  Psychiatric: She has a normal mood and affect. Her speech is normal and behavior is normal.  Nursing note and vitals reviewed.   Urgent Care Course     Procedures (including critical care time)  Labs Review Labs Reviewed  POCT RAPID STREP A    Imaging Review No results found.      MDM   1. Sore throat   2. URI, acute   3. Essential hypertension     Rapid strep negative, cx sent. Discussed plan of care with pt, most likely this is viral in nature, no abx indicated. You may take your cough syrup at home to help with nighttime  coughing. Rest, push fluids, follow up with PCP in 3 days fi no improvement, sooner if worse. BP is elevated(hx of HTN), follow up with PCP for recheck. Return to UC as needed. Pt verbalized understanding to this provider.    Tori Milks, NP [id number]

## 2016-12-29 NOTE — ED Triage Notes (Signed)
The patient presented to the [facility] with a complaint of a sore throat x 3 days and a headache that started today.

## 2016-12-29 NOTE — Discharge Instructions (Signed)
Rapid strep negative, cx sent. Discussed plan of care with pt, most likely this is viral in nature, no abx indicated. You may take your cough syrup at home to help with nighttime coughing. Rest, push fluids, follow up with PCP in 3 days fi no improvement, sooner if worse. BP is elevated(hx of HTN), follow up with PCP for recheck. Return to UC as needed.

## 2017-01-01 LAB — CULTURE, GROUP A STREP (THRC)

## 2017-01-12 ENCOUNTER — Ambulatory Visit
Admission: RE | Admit: 2017-01-12 | Discharge: 2017-01-12 | Disposition: A | Discharge status: Home / Self Care | Payer: 59 | Source: Ambulatory Visit | Attending: Family Medicine | Admitting: Family Medicine

## 2017-01-12 ENCOUNTER — Encounter: Payer: Self-pay | Admitting: Family Medicine

## 2017-01-12 ENCOUNTER — Ambulatory Visit (INDEPENDENT_AMBULATORY_CARE_PROVIDER_SITE_OTHER): Payer: 59 | Admitting: Family Medicine

## 2017-01-12 DIAGNOSIS — M5416 Radiculopathy, lumbar region: Secondary | ICD-10-CM | POA: Diagnosis not present

## 2017-01-12 DIAGNOSIS — Z1231 Encounter for screening mammogram for malignant neoplasm of breast: Secondary | ICD-10-CM

## 2017-01-12 MED ORDER — DULOXETINE HCL 30 MG PO CPEP: ORAL_CAPSULE | ORAL | 0 refills | Status: DC

## 2017-01-12 MED ORDER — BACLOFEN 10 MG PO TABS: 5.0000 mg | ORAL_TABLET | Freq: Three times a day (TID) | ORAL | 0 refills | Status: DC | PRN

## 2017-01-12 NOTE — Progress Notes (Signed)
Date of Visit: 01/12/2017   HPI:  Barbara Reese presents for follow up of her chronic low back pain and left-sided sciatica.  Treatments tried: - actively taking ibuprofen-famotidine 800-26.6 once per day, up to 3 times a week - also takes tylenol - tramadol has not helped - flexeril has not helped - has allergy to gabapentin - has had one epidural injection through neurosurgery office, which gave some relief for about a month. Reports she isn't ready for another injection due to how it made her feel afterwards (felt some numbness/weakness of her left leg after) - she is hoping to avoid needing back surgery, as she cannot afford it right now - she is averse to the idea of taking any medications labeled as antidepressants, as she took antidepressants years back and didn't like how they made her feel  Imaging: L4-L5 disc bulge on MRI from September 2017  ROS: denies fevers, lower extremity weakness recently, bowel/bladder incontinence, or saddle anesthesia. Does endorse urinary hesitancy going on for several months. No dysuria or frequency.  ROS: See HPI.  [place]: history of obesity, OSA, GERD, depression, hypertension, knee arthritis  PHYSICAL EXAM: BP 140/76   Pulse 79   Temp 98.2 F (36.8 C) (Oral)   Ht 5\' 6"  (1.676 m)   Wt 206 lb 12.8 oz (93.8 kg)   LMP 02/09/2016 (Exact Date)   SpO2 98%   BMI 33.38 kg/m  Gen: no acute distress, pleasant, cooperative HEENT: normocephalic, atraumatic Neuro: full strength bilateral lower extremities. Normal sensation to bilateral lower extremities.  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  Patient was hoping for a short-acting rx that she can take when back pain flares up. Reviewed with her that outside of tylenol, NSAIDs, and tramadol (all of which she's tried), the next short-acting "rescue" medication is an opioid. Discussed with her the limited role of opioids in chronic musculoskeletal pain, and how even a small acute rx for these can progress to long-term use and even dependency, and explained risks of opioid medications and why these are not recommended. She was very understanding and appreciative of the explanation.  Instead, we talked about other pain management modalities for chronic pain, specifically cymbalta. I encouraged her to think about cymbalta not as an "antidepressant" but more of a "pain management medication". She has both knee osteoarthritis and chronic low back pain, and could greatly benefit from cymbalta. After discussion, patient was agreeable to trying cymbalta. She will start 30mg  daily for 1 week, then increase to 60mg  daily if tolerated.   Also try baclofen as alternative muscle relaxer, since flexeril has not worked. Patient appreciative and agreeable. She'll follow up with me in 6 weeks.   Note - intended to have patient give urine sample given her reported hesitancy for several months, but given the long discussion around cymbalta and chronic pain management, we both forgot to do this prior to her leaving. I will call her and offer that she can return for a lab visit to leave a urine sample.  FOLLOW UP: Follow up in 6 weeks for low back pain.  [place]. [name], [place]

## 2017-01-12 NOTE — Patient Instructions (Signed)
Start cymbalta 30mg  daily. Take it for 1 week. If you tolerate it you can go up to 2 pills (60mg  total) daily.  Sent in baclofen - muscle relaxer. Half a pill three times daily as needed for spasms.  Come back and see me in early July.  Be well, Dr. [name]    Chronic Back Pain When back pain lasts longer than 3 months, it is called chronic back pain.The cause of your back pain may not be known. Some common causes include:  Wear and tear (degenerative disease) of the bones, ligaments, or disks in your back.  Inflammation and stiffness in your back (arthritis). People who have chronic back pain often go through certain periods in which the pain is more intense (flare-ups). Many people can learn to manage the pain with home care. Follow these instructions at home: Pay attention to any changes in your symptoms. Take these actions to help with your pain: Activity   Avoid bending and activities that make the problem worse.  Do not sit or stand in one place for long periods of time.  Take brief periods of rest throughout the day. This will reduce your pain. Resting in a lying or standing position is usually better than sitting to rest.  When you are resting for longer periods, mix in some mild activity or stretching between periods of rest. This will help to prevent stiffness and pain.  Get regular exercise. Ask your health care provider what activities are safe for you.  Do not lift anything that is heavier than 10 lb (4.5 kg). Always use proper lifting technique, which includes:  Bending your knees.  Keeping the load close to your body.  Avoiding twisting. Managing pain   If directed, apply ice to the painful area. Your health care provider may recommend applying ice during the first 24-48 hours after a flare-up begins.  Put ice in a plastic bag.  Place a towel between your skin and the bag.  Leave the ice on for 20 minutes, 2-3 times per day.  After icing, apply heat to  the affected area as often as told by your health care provider. Use the heat source that your health care provider recommends, such as a moist heat pack or a heating pad.  Place a towel between your skin and the heat source.  Leave the heat on for 20-30 minutes.  Remove the heat if your skin turns bright red. This is especially important if you are unable to feel pain, heat, or cold. You may have a greater risk of getting burned.  Try soaking in a warm tub.  Take over-the-counter and prescription medicines only as told by your health care provider.  Keep all follow-up visits as told by your health care provider. This is important. Contact a health care provider if:  You have pain that is not relieved with rest or medicine. Get help right away if:  You have weakness or numbness in one or both of your legs or feet.  You have trouble controlling your bladder or your bowels.  You have nausea or vomiting.  You have pain in your abdomen.  You have shortness of breath or you faint. This information is not intended to replace advice given to you by your health care provider. Make sure you discuss any questions you have with your health care provider. Document Released: 09/17/2004 Document Revised: 12/19/2015 Document Reviewed: 01/28/2015 Elsevier Interactive Patient Education  2017 [employer].

## 2017-01-13 ENCOUNTER — Other Ambulatory Visit (INDEPENDENT_AMBULATORY_CARE_PROVIDER_SITE_OTHER): Payer: 59

## 2017-01-13 ENCOUNTER — Encounter: Payer: Self-pay | Admitting: Family Medicine

## 2017-01-13 ENCOUNTER — Other Ambulatory Visit: Payer: Self-pay | Admitting: Family Medicine

## 2017-01-13 ENCOUNTER — Telehealth: Payer: Self-pay | Admitting: Family Medicine

## 2017-01-13 DIAGNOSIS — R3911 Hesitancy of micturition: Secondary | ICD-10-CM

## 2017-01-13 LAB — POCT URINALYSIS DIP (MANUAL ENTRY)
BILIRUBIN UA: NEGATIVE mg/dL
Bilirubin, UA: NEGATIVE
GLUCOSE UA: NEGATIVE mg/dL
Leukocytes, UA: NEGATIVE
Nitrite, UA: NEGATIVE
Protein Ur, POC: NEGATIVE mg/dL
RBC UA: NEGATIVE
SPEC GRAV UA: 1.01 (ref 1.010–1.025)
UROBILINOGEN UA: 0.2 U/dL
pH, UA: 6 (ref 5.0–8.0)

## 2017-01-13 NOTE — Assessment & Plan Note (Signed)
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  Patient was hoping for a short-acting rx that she can take when back pain flares up. Reviewed with her that outside of tylenol, NSAIDs, and tramadol (all of which she's tried), the next short-acting "rescue" medication is an opioid. Discussed with her the limited role of opioids in chronic musculoskeletal pain, and how even a small acute rx for these can progress to long-term use and even dependency, and explained risks of opioid medications and why these are not recommended. She was very understanding and appreciative of the explanation.  Instead, we talked about other pain management modalities for chronic pain, specifically cymbalta. I encouraged her to think about cymbalta not as an "antidepressant" but more of a "pain management medication". She has both knee osteoarthritis and chronic low back pain, and could greatly benefit from cymbalta. After discussion, patient was agreeable to trying cymbalta. She will start 30mg  daily for 1 week, then increase to 60mg  daily if tolerated.   Also try baclofen as alternative muscle relaxer, since flexeril has not worked. Patient appreciative and agreeable. She'll follow up with me in 6 weeks.

## 2017-01-13 NOTE — Telephone Encounter (Signed)
Called patient to mention that we forgot to get her to give a urine sample yesterday, she said she remembered this when she got home. Lab visit scheduled for this afternoon, she'll come here after she gets off work.  [name], MD

## 2017-02-08 ENCOUNTER — Other Ambulatory Visit: Payer: Self-pay | Admitting: Family Medicine

## 2017-02-12 ENCOUNTER — Telehealth: Payer: Self-pay | Admitting: Family Medicine

## 2017-02-12 NOTE — Telephone Encounter (Signed)
Pt's last day with [facility] is July 6th, PCP has no appointments until July 20th. Pt wants to know if PCP is willing to squeeze pt in on July 6th. Pt is in need of refills and wants to see PCP before insurance runs out. ep

## 2017-02-15 ENCOUNTER — Other Ambulatory Visit: Payer: Self-pay | Admitting: Family Medicine

## 2017-02-15 NOTE — Telephone Encounter (Signed)
I will do these refills when I see patient next week (I scheduled her a visit on 7/3) Barbara Rio, MD

## 2017-02-15 NOTE — Telephone Encounter (Signed)
Called patient. She has a new job starting 7/9 but her benefits won't start for 90 days. Wants to be seen before then, and get refills before her insurance through [employer] expires. I have scheduled her with me for 7/3 at 9am, when I have admin time. Patient appreciative.  [name], MD

## 2017-02-15 NOTE — Telephone Encounter (Signed)
Pt's last day with [facility] is July 6th. Pt needs all of her medications refilled including clobetasol, 90 day supply, and [facility] [address] ep

## 2017-02-23 ENCOUNTER — Encounter: Payer: Self-pay | Admitting: Family Medicine

## 2017-02-23 ENCOUNTER — Ambulatory Visit (INDEPENDENT_AMBULATORY_CARE_PROVIDER_SITE_OTHER): Payer: 59 | Admitting: Family Medicine

## 2017-02-23 VITALS — BP 122/84 | HR 88 | Temp 97.5°F | Ht 66.0 in | Wt 266.0 lb

## 2017-02-23 DIAGNOSIS — R7303 Prediabetes: Secondary | ICD-10-CM | POA: Diagnosis not present

## 2017-02-23 DIAGNOSIS — I10 Essential (primary) hypertension: Secondary | ICD-10-CM

## 2017-02-23 DIAGNOSIS — E669 Obesity, unspecified: Secondary | ICD-10-CM | POA: Diagnosis not present

## 2017-02-23 DIAGNOSIS — Z6841 Body Mass Index (BMI) 40.0 and over, adult: Secondary | ICD-10-CM

## 2017-02-23 DIAGNOSIS — K219 Gastro-esophageal reflux disease without esophagitis: Secondary | ICD-10-CM

## 2017-02-23 DIAGNOSIS — IMO0001 Reserved for inherently not codable concepts without codable children: Secondary | ICD-10-CM

## 2017-02-23 DIAGNOSIS — R490 Dysphonia: Secondary | ICD-10-CM | POA: Diagnosis not present

## 2017-02-23 DIAGNOSIS — L430 Hypertrophic lichen planus: Secondary | ICD-10-CM | POA: Diagnosis not present

## 2017-02-23 LAB — POCT GLYCOSYLATED HEMOGLOBIN (HGB A1C): Hemoglobin A1C: 6

## 2017-02-23 MED ORDER — HYDROCHLOROTHIAZIDE 25 MG PO TABS: 25.0000 mg | ORAL_TABLET | Freq: Every day | ORAL | 3 refills | Status: DC

## 2017-02-23 MED ORDER — PANTOPRAZOLE SODIUM 40 MG PO TBEC: 40.0000 mg | DELAYED_RELEASE_TABLET | Freq: Every day | ORAL | 3 refills | Status: DC

## 2017-02-23 MED ORDER — CLOBETASOL PROPIONATE 0.05 % EX OINT: 1.0000 "application " | TOPICAL_OINTMENT | Freq: Every day | CUTANEOUS | 1 refills | Status: DC | PRN

## 2017-02-23 NOTE — Patient Instructions (Addendum)
It was great to see you again today!  Refilled HCTZ, protonix, and [facility]obetasol[facility]  Printed a new HCTZ prescription in case you need to take it to [facility].  If you run out of protonix, can try over the counter prilosec (same class of medication)  Come see me again after your new insurance kicks in. Best of luck with your new job!  Be well, Dr. [name]

## 2017-02-23 NOTE — Progress Notes (Signed)
Date of Visit: 02/23/2017   HPI:  Barbara Reese presents today for medication refill. She is starting a new job next week and her insurance will not begin until 90 days, so she wants to get medications filled now. She hopes to continue coming here as a patient.  Hoarse voice - I noted her voice seemed hoarse today, and she reports it's intermittently done this since her hysterectomy 1 year ago. People comment on her voice when it is hoarse. Has not seen ENT, but agreeable for referral.  Back & knee pain - taking duexis about 3 times per week (ibuprofen ranitidine combo pill). Well controlled on this medication. She previously tried cymbalta but did not tolerate it and discontinued it. 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 She hopes this will be much better for her body.  Prediabetes - due for updated A1c today.  GERD - taking protonix 40mg  daily. Works well for her GERD. She can tell when she does not take it.  Eczematous spots/lichen planus - needs refill of clobetasol. Uses sparingly, about one time for each spot that flares. Has never caused her problems like thinning/lightening of skin.  Hypertension - taking HCTZ 25mg  daily. Tolerating well. No chest pain, shortness of breath, or swelling.  ROS: See HPI.  [place]: history of obesity, OSA, GERD, depression, hypertension, gout  PHYSICAL EXAM: BP 122/84   Pulse 88   Temp 97.5 F (36.4 C) (Oral)   Ht 5\' 6"  (1.676 m)   Wt 266 lb (120.7 kg)   LMP 02/09/2016 (Exact Date)   SpO2 99%   BMI 42.93 kg/m  Gen: no acute distress, pleasant, cooperative HEENT: normocephalic, atraumatic, moist mucous membranes. Oropharynx not fully visible due to habitus. No anterior cervical or supraclavicular lymphadenopathy.  Heart: regular rate and rhythm, no murmur Lungs: clear to auscultation bilaterally, normal work of breathing  Neuro: alert, grossly nonfocal,  speech normal Ext: No appreciable lower extremity edema bilaterally   ASSESSMENT/PLAN:  Health maintenance:  -updated HM info with date of last tetanus shot (she brought records) -still due for colonoscopy, has info on how to schedule  OBESITY, MODERATE Discussed need for weight loss, healthy eating, and exercise. Recommended water aerobics as low-impact exercise. Checked A1c today, which was 6.0. Counseled on prediabetes. Specifically recommended attention to weight now that she will be less mobile with her new job.  GERD Well controlled, refilled protonix. Recommended switching to over the counter omeprazole if cannot afford protonix during her period of no insurance.  Hypertension Well controlled. Refilled HCTZ to her pharmacy, also gave printed rx in case she needs to use the [facility] 4 dollar list while she is out of insurance.  Prediabetes A1c today 6.0. Counseled on weight loss, healthy eating, exercise to prevent progression to diabetes. Patient is motivated to make changes, has family history of diabetes.   Lichen planus hypertrophicus Refill clobetasol, uses sparingly.  Hoarseness Warrants ENT eval given ongoing symptoms. Will refer to ENT. Patient agreeable.  FOLLOW UP: Follow up in 3 mos for above issues once insurance has resumed.  [place]. [name], [place]

## 2017-02-25 NOTE — Assessment & Plan Note (Signed)
Refill clobetasol, uses sparingly.

## 2017-02-25 NOTE — Assessment & Plan Note (Signed)
Discussed need for weight loss, healthy eating, and exercise. Recommended water aerobics as low-impact exercise. Checked A1c today, which was 6.0. Counseled on prediabetes. Specifically recommended attention to weight now that she will be less mobile with her new job.

## 2017-02-25 NOTE — Assessment & Plan Note (Signed)
Well controlled. Refilled HCTZ to her pharmacy, also gave printed rx in case she needs to use the [facility] 4 dollar list while she is out of insurance.

## 2017-02-25 NOTE — Assessment & Plan Note (Addendum)
Well controlled, refilled protonix. Recommended switching to over the counter omeprazole if cannot afford protonix during her period of no insurance.

## 2017-02-25 NOTE — Assessment & Plan Note (Signed)
A1c today 6.0. Counseled on weight loss, healthy eating, exercise to prevent progression to diabetes. Patient is motivated to make changes, has family history of diabetes.

## 2017-04-06 ENCOUNTER — Telehealth: Payer: Self-pay | Admitting: Family Medicine

## 2017-04-06 NOTE — Telephone Encounter (Signed)
Received faxed forms from patient's job at [employer] to support intermittent leave for medical issues. I need to speak with patient before completing these forms. Called patient & left VM on her phone asking her to call back so we can discuss forms.  [name], MD

## 2017-04-06 NOTE — Telephone Encounter (Signed)
Form copied for scanning in patient's record.  Forms faxed to [facility]; AttnLucien Mons.  Original copy placed up front for pickup.  Derl Barrow, RN

## 2017-04-06 NOTE — Telephone Encounter (Signed)
Patient called back and I spoke with her. Forms completed. Will need to be scanned in and faxed back.  Will give to Tamika RN  [name], MD

## 2017-05-13 ENCOUNTER — Other Ambulatory Visit: Payer: Self-pay | Admitting: Family Medicine

## 2017-05-31 ENCOUNTER — Other Ambulatory Visit: Payer: Self-pay | Admitting: Family Medicine

## 2017-05-31 NOTE — Telephone Encounter (Signed)
Refill denied. Pt has not been seen in over a year.  

## 2017-06-11 ENCOUNTER — Ambulatory Visit (INDEPENDENT_AMBULATORY_CARE_PROVIDER_SITE_OTHER): Payer: BLUE CROSS/BLUE SHIELD | Admitting: Family Medicine

## 2017-06-11 ENCOUNTER — Encounter: Payer: Self-pay | Admitting: Family Medicine

## 2017-06-11 DIAGNOSIS — F418 Other specified anxiety disorders: Secondary | ICD-10-CM | POA: Diagnosis not present

## 2017-06-11 DIAGNOSIS — I10 Essential (primary) hypertension: Secondary | ICD-10-CM

## 2017-06-11 DIAGNOSIS — L430 Hypertrophic lichen planus: Secondary | ICD-10-CM | POA: Diagnosis not present

## 2017-06-11 DIAGNOSIS — K219 Gastro-esophageal reflux disease without esophagitis: Secondary | ICD-10-CM | POA: Diagnosis not present

## 2017-06-11 MED ORDER — LORAZEPAM 0.5 MG PO TABS: ORAL_TABLET | ORAL | 0 refills | Status: DC

## 2017-06-11 NOTE — Patient Instructions (Addendum)
It was great to see you again today!  Stay on current medications  Gave prescription for ativan for the plane ride  I'll get in touch with the gynecologist about whether you need pap smears moving forward  Follow up with me in 3 months  Be well, Dr. [name]

## 2017-06-13 MED ORDER — PANTOPRAZOLE SODIUM 40 MG PO TBEC: 40.0000 mg | DELAYED_RELEASE_TABLET | Freq: Every day | ORAL | 3 refills | Status: DC

## 2017-06-13 MED ORDER — HYDROCHLOROTHIAZIDE 25 MG PO TABS: 25.0000 mg | ORAL_TABLET | Freq: Every day | ORAL | 3 refills | Status: DC

## 2017-06-13 NOTE — Assessment & Plan Note (Signed)
Well-controlled.  Continue current regimen. 

## 2017-06-13 NOTE — Assessment & Plan Note (Signed)
Will rx low dose ativan #5 tablets for her to use on plane ride. Patient appreciative.

## 2017-06-13 NOTE — Progress Notes (Signed)
Date of Visit: 06/11/2017   HPI:  Patient presents for routine follow up. Has a new job that is going well.  GERD - patient reports symptoms are well controlled on protonix daily.   Hypertension - taking HCTZ 25mg  daily. Tolerating well. No chest pain, shortness of breath, or swelling.   Plane ride - flying to [place] in a few days for vacation. Gets anxious with plane rides and in the past has done well with a low dose benzo rx to help her on the plane. Requests this today.  Lichen planus - using clobetasol only sparingly  Health maintenance - wants to wait on flu shot until after she gets back from vacation Wonders if she will need any further pap smears. Has history of total hysterectomy (still has ovaries) due to history of endometrial hyperplasia with atypia and AUB.   ROS: See HPI.  [place]: history of hypertension, obesity, knee arthritis, GERD  PHYSICAL EXAM: BP 132/82   Pulse 89   Temp 98.2 F (36.8 C) (Oral)   Ht 5\' 6"  (1.676 m)   Wt 271 lb 3.2 oz (123 kg)   LMP 02/09/2016 (Exact Date)   SpO2 99%   BMI 43.77 kg/m  Gen: no acute distress, pleasant, cooperative HEENT: normocephalic, atraumatic, moist mucous membranes  Heart: regular rate and rhythm, no murmur Lungs: clear to auscultation bilaterally normal work of breathing  Neuro: alert, grossly nonfocal, speech normal Ext: No appreciable lower extremity edema bilaterally   ASSESSMENT/PLAN:  Health maintenance:  -patient to return for flu shot after her vacation -will message GYN surgeon Dr. Ihor Dow regarding whether patient needs further paps since hysterectomy was not for entirely benign reasons -again reminded patient of need for colonoscopy  GERD Well controlled. Continue current regimen.   Hypertension Well controlled. Continue current regimen.   Situational anxiety Will rx low dose ativan #5 tablets for her to use on plane ride. Patient appreciative.  Lichen planus hypertrophicus Continues to  use clobetasol only sparingly  FOLLOW UP: Follow up in 3 months for routine medical issues  [place] J. [name], [place]

## 2017-06-13 NOTE — Assessment & Plan Note (Signed)
Continues to use clobetasol only sparingly

## 2017-06-30 ENCOUNTER — Other Ambulatory Visit: Payer: Self-pay | Admitting: Family Medicine

## 2017-07-01 ENCOUNTER — Ambulatory Visit (INDEPENDENT_AMBULATORY_CARE_PROVIDER_SITE_OTHER): Payer: BLUE CROSS/BLUE SHIELD | Admitting: Family Medicine

## 2017-07-01 ENCOUNTER — Telehealth: Payer: Self-pay | Admitting: Family Medicine

## 2017-07-01 ENCOUNTER — Ambulatory Visit: Payer: Self-pay

## 2017-07-01 ENCOUNTER — Encounter: Payer: Self-pay | Admitting: Family Medicine

## 2017-07-01 VITALS — BP 138/80 | HR 100 | Ht 66.0 in | Wt 270.0 lb

## 2017-07-01 DIAGNOSIS — M79671 Pain in right foot: Secondary | ICD-10-CM | POA: Diagnosis not present

## 2017-07-01 DIAGNOSIS — M7752 Other enthesopathy of left foot: Secondary | ICD-10-CM

## 2017-07-01 DIAGNOSIS — M79672 Pain in left foot: Secondary | ICD-10-CM | POA: Diagnosis not present

## 2017-07-01 NOTE — Patient Instructions (Signed)
Good to see you  Ice 20 minutes 2 times daily. Usually after activity and before bed. Exercises 3 times a week.  pennsaid pinkie amount topically 2 times daily as needed.  Wear brace daily  Can start walking again in 10 days.  Heel lift could be good  See me again in 3-4 weeks

## 2017-07-01 NOTE — Progress Notes (Signed)
[name] [address], [place] [address] Phone: [phone] Subjective:     CC: Left greater than right ankle pain  JOA:[id number]  Barbara Reese is a 51 y.o. female coming in with complaint of bilateral foot pain. The left is the worse. She noticed her pain after walking a lot during her vacation. She was wearing water shoes and stepped on rocks throughout her trip. Has a history of left ankle sprain. She's tried stretching but it did not take the pain away.   Onset- 2 weeks ago Location- Achilles  Duration- All day Character- Sharp Aggravating factors- Keeps her up at night, standing, walking  Reliving factors-  Therapies tried- Ace bandage, Ibuprofen, Ice Severity-6 out of 10     Past Medical History:  Diagnosis Date  . Allergic rhinitis   . Anemia   . Anxiety   . Depressive disorder    Hx  . 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 GERD (gastroesophageal reflux disease)   . Hepatitis    age 30- dx'd with chronic hepatitis- no problems as an adult  . Hypertension   . IBS (irritable bowel syndrome)    diet controlled, no meds  . Insomnia   . 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 OSA (obstructive sleep apnea)    Does not use CPAP.  Marland Kitchen Osteoarthritis    knees  . PONV (postoperative nausea and vomiting)   . Shortness of breath dyspnea    on exertion  . SVD (spontaneous vaginal delivery)    x 1   Past Surgical History:  Procedure Laterality Date  . CHOLECYSTECTOMY  1987  . KNEE SURGERY  2004   left knee- Dr. Percell Miller  . UPPER GI ENDOSCOPY     gerd   Social History   Socioeconomic History  . Marital status: Single    Spouse name: Not on file  . Number of children: Not on file  . Years of education: Not on file  . Highest education level: Not on file  Social Needs  . Financial resource strain: Not on file  . Food  insecurity - worry: Not on file  . Food insecurity - inability: Not on file  . Transportation needs - medical: Not on file  . Transportation needs - non-medical: Not on file  Occupational History  . 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 Smoking status: Never Smoker  . Smokeless tobacco: Never Used  Substance and Sexual Activity  . Alcohol use: Yes    Alcohol/week: 1.2 oz    Types: 2 Glasses of wine per week    Comment: wine   . Drug use: No  . Sexual activity: No    Birth control/protection: None  Other Topics Concern  . Not on file  Social History Narrative   Pt is single and lives with 32 year old son.   Allergies  Allergen Reactions  . Gabapentin Rash    Itchy rash   Family History  Problem Relation Age of Onset  . Osteoarthritis Mother   . Hypertension Mother   . Allergies Mother   . Rheum arthritis Mother   . Diabetes Father   . Hypertension Sister   . Heart attack Brother   . Heart attack Sister   . Allergies Son   . Allergies Sister   . Asthma  Son   . Clotting disorder Sister   . Breast cancer Maternal Grandmother 26     Past medical history, social, surgical and family history all reviewed in electronic medical record.  No pertanent information unless stated regarding to the chief complaint.   Review of Systems:Review of systems updated and as accurate as of 07/01/17  No headache, visual changes, nausea, vomiting, diarrhea, constipation, dizziness, abdominal pain, skin rash, fevers, chills, night sweats, weight loss, swollen lymph nodes, body aches, joint swelling, muscle aches, chest pain, shortness of breath, mood changes.   Objective  Blood pressure (!) 123/45, last menstrual period 02/09/2016. Systems examined below as of 07/01/17   General: No apparent distress alert and oriented x3 mood and affect normal, dressed appropriately.  HEENT: Pupils equal, extraocular movements intact  Respiratory:  Patient's speak in full sentences and does not appear short of breath  Cardiovascular: No lower extremity edema, non tender, no erythema  Skin: Warm dry intact with no signs of infection or rash on extremities or on axial skeleton.  Abdomen: Soft nontender  Neuro: Cranial nerves II through XII are intact, neurovascularly intact in all extremities with 2+ DTRs and 2+ pulses.  Lymph: No lymphadenopathy of posterior or anterior cervical chain or axillae bilaterally.  Gait mild antalgic MSK:  Non tender with full range of motion and good stability and symmetric strength and tone of shoulders, elbows, wrist, hip, bilaterally.  Mild arthritic changes of the knees bilaterally Ankle: Left Mild Hickling nodule noted Range of motion is full in all directions. Strength is 5/5 in all directions. Stable lateral and medial ligaments; squeeze test and kleiger test unremarkable; Talar dome nontender; No pain at base of 5th MT; No tenderness over cuboid; No tenderness over N spot or navicular prominence No tenderness on posterior aspects of lateral and medial malleolus No sign of peroneal tendon subluxations or tenderness to palpation Negative tarsal tunnel tinel's Moderate to severe tenderness in the retrocalcaneal area and the insertion of the Achilles. Able to walk 4 steps.  Ultra lateral ankle mild pain over the Achilles as well.  MSK US performed of: Right ankle This study was ordered, performed, and interpreted by Charlann Boxer D.O.  Foot/Ankle:    Talar dome unremarkable  Ankle mortise without effusion. Peroneus longus and brevis tendons unremarkable on long and transverse views without sheath effusions. Posterior tibialis, flexor hallucis longus, and flexor digitorum longus tendons unremarkable on long and transverse views without sheath effusions. Achilles tendon mild hypoechoic changes.  Patient does have a large retrocalcaneal bursa sac noted. Anterior Talofibular Ligament and Calcaneofibular  Ligaments unremarkable and intact. Deltoid Ligament unremarkable and intact. Plantar fascia intact and without effusion, normal thickness. No increased doppler signal, cap sign, or thickening of tibial cortex. Power doppler signal normal.  IMPRESSION: Retrocalcaneal bursitis  Procedure: Real-time Ultrasound Guided Injection of left retrocalcaneal bursa Device: GE Logiq Q7 Ultrasound guided injection is preferred based studies that show increased duration, increased effect, greater accuracy, decreased procedural pain, increased response rate, and decreased cost with ultrasound guided versus blind injection.  Verbal informed consent obtained.  Time-out conducted.  Noted no overlying erythema, induration, or other signs of local infection.  Skin prepped in a sterile fashion.  Local anesthesia: Topical Ethyl chloride.  With sterile technique and under real time ultrasound guidance: The 25-gauge half inch needle was injected with a total of 0.5 cc of 0.5% Marcaine and 0.5 cc of Kenalog 40 mg/mL into the retrocalcaneal bursa Completed without difficulty  Pain immediately resolved suggesting  accurate placement of the medication.  Advised to call if fevers/chills, erythema, induration, drainage, or persistent bleeding.  Images permanently stored and available for review in the ultrasound unit.  Impression: Technically successful ultrasound guided injection.  [id number]; 15 additional minutes spent for Therapeutic exercises as stated in above notes.  This included exercises focusing on stretching, strengthening, with significant focus on eccentric aspects.   Long term goals include an improvement in range of motion, strength, endurance as well as avoiding reinjury. Patient's frequency would include in 1-2 times a day, 3-5 times a week for a duration of 6-12 weeks.Ankle strengthening that included:  Basic range of motion exercises to allow proper full motion at ankle Stretching of the lower leg and  hamstrings  Theraband exercises for the lower leg - inversion, eversion, dorsiflexion and plantarflexion each to be completed with a theraband which was given  Balance exercises to increase proprioception Weight bearing exercises to increase strength and balance   Proper technique shown and discussed handout in great detail with ATC.  All questions were discussed and answered.    Impression and Recommendations:     This case required medical decision making of moderate complexity.      Note: This dictation was prepared with Dragon dictation along with smaller phrase technology. Any transcriptional errors that result from this process are unintentional.

## 2017-07-01 NOTE — Assessment & Plan Note (Signed)
Given injection.  Tolerated the procedure well.  We discussed no walking more than daily activities over the course the next 10 days.  Icing regimen, topical anti-inflammatories.  We discussed avoiding any high impact activities for at least 2-3 weeks.  Patient will get a heel lift.  Brace given.  Follow-up again in 3-4 weeks

## 2017-07-06 ENCOUNTER — Ambulatory Visit: Payer: BLUE CROSS/BLUE SHIELD | Admitting: Family Medicine

## 2017-07-08 NOTE — Telephone Encounter (Signed)
Attempted to reach patient to ask about this refill request, since I haven't prescribed the tramadol in over a year. No answer. LVM asking her to call back.  [name], MD

## 2017-07-09 ENCOUNTER — Telehealth: Payer: Self-pay | Admitting: Family Medicine

## 2017-07-09 MED ORDER — TRAMADOL HCL 50 MG PO TABS: 50.0000 mg | ORAL_TABLET | Freq: Three times a day (TID) | ORAL | 0 refills | Status: DC | PRN

## 2017-07-09 NOTE — Telephone Encounter (Signed)
Pt is returning a call from Dr. [name]. She said that she wanted a refill on her Tramadol because of the increased pain in her lower back. jw

## 2017-07-09 NOTE — Telephone Encounter (Signed)
Returned call to patient to discuss.  She reports she is having a flareup of her back pain recently.  Recently seen by Dr. Tamala Julian and diagnosed with a tear of her Achilles, and had an injection into her foot.  Due to walking funny due to foot pain, she now is having flareup of back pain.  Denies any weakness/numbness in her lower extremities, saddle anesthesia, fevers, or bowel or bladder dysfunction.  Would like a refill of tramadol to help with the back pain.  Also reports that she was seen earlier this week by the ENT physician for hoarseness, and was diagnosed with a vocal cord polyp.  Is on an increased dose of PPI for a month, and then will be seen in follow-up.  She may have to have surgery.  This is very overwhelming for her.  She has not been sleeping well, and has felt very worried and sad since that appointment on Tuesday.  Denies thoughts of hurting herself or others.  Discussed options for mood management with her over the phone.  The plan we came up with was to send in the tramadol for her, which should help with pain, which will therefore also hopefully help her sleep.  I suspect that if she can get some better sleep she will feel better during the day as well.  I asked her to call me next week to let me know how she is doing.  If need be, we can schedule her with one of our behavioral health counselors next week.  I offered to go ahead and schedule an appointment for her but she wanted to wait, as she has limited availability to get off of work with her new job.  Patient was appreciative. rx for tramadol called in.  [name], MD

## 2017-07-27 NOTE — Telephone Encounter (Signed)
error 

## 2017-09-01 MED FILL — traMADol HCL 50 MG TABS: 50 | 3 days supply | Qty: 9 | Fill #0

## 2017-09-01 MED FILL — HYDROCHLOROTHIAZIDE 25 MG T: 25 | 90 days supply | Qty: 90 | Fill #0

## 2017-09-01 MED FILL — PANTOPRAZOLE SOD DR 40 MG T: 40 | 90 days supply | Qty: 90 | Fill #0

## 2017-09-02 ENCOUNTER — Telehealth: Payer: Self-pay | Admitting: Family Medicine

## 2017-09-02 NOTE — Telephone Encounter (Signed)
Pt is calling for a refill on her Tramadol. She said that this is helping a lot. jw

## 2017-09-03 MED ORDER — TRAMADOL HCL 50 MG PO TABS: 50.0000 mg | ORAL_TABLET | Freq: Three times a day (TID) | ORAL | 0 refills | Status: DC | PRN

## 2017-09-15 ENCOUNTER — Telehealth: Payer: Self-pay | Admitting: Family Medicine

## 2017-09-15 ENCOUNTER — Encounter: Payer: Self-pay | Admitting: Family Medicine

## 2017-09-15 ENCOUNTER — Ambulatory Visit: Payer: Self-pay

## 2017-09-15 ENCOUNTER — Ambulatory Visit: Payer: 59 | Admitting: Family Medicine

## 2017-09-15 ENCOUNTER — Ambulatory Visit (INDEPENDENT_AMBULATORY_CARE_PROVIDER_SITE_OTHER)
Admission: RE | Admit: 2017-09-15 | Discharge: 2017-09-15 | Disposition: A | Discharge status: Home / Self Care | Payer: 59 | Source: Ambulatory Visit | Attending: Family Medicine | Admitting: Family Medicine

## 2017-09-15 VITALS — BP 122/88 | HR 91 | Ht 66.0 in | Wt 280.0 lb

## 2017-09-15 DIAGNOSIS — M79672 Pain in left foot: Secondary | ICD-10-CM

## 2017-09-15 DIAGNOSIS — M7752 Other enthesopathy of left foot: Secondary | ICD-10-CM | POA: Diagnosis not present

## 2017-09-15 DIAGNOSIS — M7989 Other specified soft tissue disorders: Secondary | ICD-10-CM | POA: Diagnosis not present

## 2017-09-15 MED ORDER — ALLOPURINOL 100 MG PO TABS: 200.0000 mg | ORAL_TABLET | Freq: Every day | ORAL | 6 refills | Status: DC

## 2017-09-15 MED ORDER — TRAMADOL HCL 50 MG PO TABS: 50.0000 mg | ORAL_TABLET | Freq: Three times a day (TID) | ORAL | 0 refills | Status: DC | PRN

## 2017-09-15 MED FILL — ALLOPURINOL 100 MG TABLET: 100 | 30 days supply | Qty: 60 | Fill #0

## 2017-09-15 MED FILL — traMADol HCL 50 MG TABS: 50 | 10 days supply | Qty: 30 | Fill #0

## 2017-09-15 NOTE — Telephone Encounter (Signed)
Pt called about her tramadol Rx. She said it was supposed to be sent to [facility], not [facility]. She only uses the outpatient pharmacy now. Please advise

## 2017-09-15 NOTE — Telephone Encounter (Signed)
Copied from [place]. Topic: Appointment Scheduling - Scheduling Inquiry for Clinic >> Sep 15, 2017  9:25 AM Synthia Innocent wrote: Reason for CRM: Left ankle is still swelling requesting to have xray, please advise  >> Sep 15, 2017  9:27 AM Synthia Innocent wrote: Or have a appt with Dr Tamala Julian today

## 2017-09-15 NOTE — Addendum Note (Signed)
Addended by: [name] on: 09/15/2017 02:23 PM   Modules accepted: Orders

## 2017-09-15 NOTE — Telephone Encounter (Signed)
Confirmed [place] CSRS which shows patient never had rx filled at [facility]. Resent to [place].  [name], MD

## 2017-09-15 NOTE — Progress Notes (Signed)
[name] [address] [place], [place] [address] Phone: [phone] Subjective:     CC: Foot pain follow-up  TFT:[id number]  Barbara Reese is a 52 y.o. female coming in with complaint of foot pain.  We have seen patient previously and was found to have a retrocalcaneal bursitis that was injected July 01, 2017.  Patient states that she did not have any relief from the injection. She is using her brace. She said that she experiences swelling daily. She has also been working on her exercises.       Past Medical History:  Diagnosis Date  . Allergic rhinitis   . Anemia   . Anxiety   . Depressive disorder    Hx  . 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 GERD (gastroesophageal reflux disease)   . Hepatitis    age 54- dx'd with chronic hepatitis- no problems as an adult  . Hypertension   . IBS (irritable bowel syndrome)    diet controlled, no meds  . Insomnia   . 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 OSA (obstructive sleep apnea)    Does not use CPAP.  Marland Kitchen Osteoarthritis    knees  . PONV (postoperative nausea and vomiting)   . Shortness of breath dyspnea    on exertion  . SVD (spontaneous vaginal delivery)    x 1   Past Surgical History:  Procedure Laterality Date  . CHOLECYSTECTOMY  1987  . CYSTOSCOPY  02/18/2016   Procedure: CYSTOSCOPY;  Surgeon: Lavonia Drafts, MD;  Location: [facility];  Service: Gynecology;;  . HYSTEROSCOPY W/D&C N/A 12/30/2015   Procedure: DILATATION AND CURETTAGE Pol[place]ann Glen;  Surgeon: Mora Bellman, MD;  Location: [facility];  Service: Gynecology;  Laterality: N/A;  . KNEE SURGERY  2004   left knee- Dr. Percell Miller  . ROBOTIC ASSISTED TOTAL HYSTERECTOMY WITH SALPINGECTOMY N/A 02/18/2016   Procedure: ROBOTIC ASSISTED TOTAL HYSTERECTOMY WITH SALPINGECTOMY;  Surgeon: Lavonia Drafts, MD;  Location: [facility];  Service:  Gynecology;  Laterality: N/A;  . UPPER GI ENDOSCOPY     gerd   Social History   Socioeconomic History  . Marital status: Single    Spouse name: None  . Number of children: None  . Years of education: None  . Highest education level: None  Social Needs  . Financial resource strain: None  . Food insecurity - worry: None  . Food insecurity - inability: None  . Transportation needs - medical: None  . Transportation needs - non-medical: None  Occupational History  . 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 Smoking status: Never Smoker  . Smokeless tobacco: Never Used  Substance and Sexual Activity  . Alcohol use: Yes    Alcohol/week: 1.2 oz    Types: 2 Glasses of wine per week    Comment: wine   . Drug use: No  . Sexual activity: No    Birth control/protection: None  Other Topics Concern  . None  Social History Narrative   Pt is single and lives with 92 year old son.   Allergies  Allergen Reactions  . Gabapentin Rash    Itchy rash   Family History  Problem Relation Age of Onset  . Osteoarthritis Mother   . Hypertension Mother   . Allergies Mother   . Rheum arthritis Mother   . Diabetes Father   .  Hypertension Sister   . Heart attack Brother   . Heart attack Sister   . Allergies Son   . Allergies Sister   . Asthma Son   . Clotting disorder Sister   . Breast cancer Maternal Grandmother 68     Past medical history, social, surgical and family history all reviewed in electronic medical record.  No pertanent information unless stated regarding to the chief complaint.   Review of Systems:Review of systems updated and as accurate as of 09/15/17  No headache, visual changes, nausea, vomiting, diarrhea, constipation, dizziness, abdominal pain, skin rash, fevers, chills, night sweats, weight loss, swollen lymph nodes, body aches, joint swelling, muscle aches, chest pain, shortness of breath, mood changes.    Objective  Blood pressure 122/88, pulse 91, height 5\' 6"  (1.676 m), weight 280 lb (127 kg), last menstrual period 02/09/2016, SpO2 98 %. Systems examined below as of 09/15/17   General: No apparent distress alert and oriented x3 mood and affect normal, dressed appropriately.  HEENT: Pupils equal, extraocular movements intact  Respiratory: Patient's speak in full sentences and does not appear short of breath  Cardiovascular: No lower extremity edema, non tender, no erythema  Skin: Warm dry intact with no signs of infection or rash on extremities or on axial skeleton.  Abdomen: Soft nontender  Neuro: Cranial nerves II through XII are intact, neurovascularly intact in all extremities with 2+ DTRs and 2+ pulses.  Lymph: No lymphadenopathy of posterior or anterior cervical chain or axillae bilaterally.  Gait antalgic gait MSK:  Non tender with full range of motion and good stability and symmetric strength and tone of shoulders, elbows, wrist, hip, knee bilaterally.  Ankle: Left Mild swelling over the lateral aspect of the ankle pes planus Strength is 4/5 in all directions. Stable lateral and medial ligaments; squeeze test and kleiger test unremarkable; Talar dome nontender; tender to palpation over the Achilles insertion system. No pain at base of 5th MT; No tenderness over cuboid; No tenderness over N spot or navicular prominence No tenderness on posterior aspects of lateral and medial malleolus No sign of peroneal tendon subluxations or tenderness to palpation Negative tarsal tunnel tinel's Able to walk 4 steps. Contralateral ankle unremarkable  MSK US performed of: Left This study was ordered, performed, and interpreted by Charlann Boxer D.O.  Foot/Ankle:   All structures visualized.   Talar dome unremarkable  Patient does have a lipoma over the lateral aspect of the ankle.  Patient does have the retrocalcaneal bursa noted as well.  IMPRESSION: Retrocalcaneal bursitis and possible  gout    Impression and Recommendations:     This case required medical decision making of moderate complexity.      Note: This dictation was prepared with Dragon dictation along with smaller phrase technology. Any transcriptional errors that result from this process are unintentional.

## 2017-09-15 NOTE — Assessment & Plan Note (Signed)
Patient does have hypoechoic changes.  Still there.  States that she did not make improvement even though it has been nearly 3 months since we have seen patient.  We discussed with patient about changes there is a possibility of allopurinol helping with this being possible gout.  Patient wants to try this.  We discussed diet, exercise, hydration as well.  We discussed the possible laboratory workup which patient declined.  We will see how patient responds.  X-rays ordered today as well.

## 2017-09-15 NOTE — Telephone Encounter (Signed)
Spoke with patient. Scheduled for this afternoon.

## 2017-09-15 NOTE — Patient Instructions (Addendum)
Good to see you  Possible gout  Try the colchicine 2 times a day for 3 days.  At the same time start the allopurionl 200mg  daily.  If gout will feel better very quickly  Send a message on Monday and we will talk  Xray of foot downstairs as well today to make sure we are not missing anything else ee me again in 2-3 weeks

## 2017-10-20 ENCOUNTER — Telehealth: Payer: Self-pay | Admitting: Family Medicine

## 2017-10-20 DIAGNOSIS — M7752 Other enthesopathy of left foot: Secondary | ICD-10-CM

## 2017-10-20 DIAGNOSIS — M25572 Pain in left ankle and joints of left foot: Secondary | ICD-10-CM

## 2017-10-20 NOTE — Telephone Encounter (Signed)
Discussed with pt. She is requesting a night splint to wear. She states she is in a lot of pain. She also states she is currently taking tramadol & ibuprofen & would like to know if there is anything else you recommend.   MRI ordered.

## 2017-10-20 NOTE — Telephone Encounter (Signed)
Lets MRI ankle please.  Increase allopurinol to 300mg   IF still on HCTZ she needs to at least half it

## 2017-10-20 NOTE — Telephone Encounter (Signed)
Copied from [place] [id number]. Topic: Quick Communication - See Telephone Encounter >> Oct 20, 2017 12:57 PM Barbara Reese, NT wrote: CRM for notification. See Telephone encounter for:   10/20/17. Pt. Calling to ask Barbara Reese if he can order a mri. Pt. Is still having pain with her ankle. Pt. Is also asking can she get a boost of the med. That she is currently on. Pt. Would like for Dr. Tamala Reese or nurse to give her a call to let her know what is going to be the next step.

## 2017-10-22 NOTE — Telephone Encounter (Signed)
Patient is calling to check the status of her receiving a boot for her foot. If someone could give her a call back about this [phone]

## 2017-10-27 NOTE — Telephone Encounter (Signed)
Spoke to pt, she is coming by the office tomorrow to get fitted for a boot.

## 2017-10-27 NOTE — Telephone Encounter (Signed)
Patient is calling to follow up on this. Are you all waiting on her to have a MRI first? Can you follow up with patient, she upset with how long this is taking. I informed her it can take 7 to 14 days to get a call for the MRI. Thank you.

## 2017-10-29 DIAGNOSIS — M7752 Other enthesopathy of left foot: Secondary | ICD-10-CM | POA: Diagnosis not present

## 2017-10-29 DIAGNOSIS — M79672 Pain in left foot: Secondary | ICD-10-CM | POA: Diagnosis not present

## 2017-11-09 ENCOUNTER — Ambulatory Visit
Admission: RE | Admit: 2017-11-09 | Discharge: 2017-11-09 | Disposition: A | Discharge status: Home / Self Care | Payer: 59 | Source: Ambulatory Visit | Attending: Family Medicine | Admitting: Family Medicine

## 2017-11-09 DIAGNOSIS — M25572 Pain in left ankle and joints of left foot: Secondary | ICD-10-CM

## 2017-11-09 DIAGNOSIS — M7989 Other specified soft tissue disorders: Secondary | ICD-10-CM | POA: Diagnosis not present

## 2017-11-16 DIAGNOSIS — R490 Dysphonia: Secondary | ICD-10-CM | POA: Diagnosis not present

## 2017-11-16 DIAGNOSIS — J381 Polyp of vocal cord and larynx: Secondary | ICD-10-CM | POA: Diagnosis not present

## 2017-11-16 DIAGNOSIS — G4733 Obstructive sleep apnea (adult) (pediatric): Secondary | ICD-10-CM | POA: Diagnosis not present

## 2017-11-17 ENCOUNTER — Other Ambulatory Visit: Payer: Self-pay

## 2017-11-17 ENCOUNTER — Encounter
Admission: RE | Admit: 2017-11-17 | Discharge: 2017-11-17 | Disposition: A | Discharge status: Home / Self Care | Payer: 59 | Source: Ambulatory Visit | Attending: Otolaryngology | Admitting: Otolaryngology

## 2017-11-17 HISTORY — DX: Unspecified disorder of synovium and tendon, left ankle and foot: M67.972

## 2017-11-17 NOTE — Patient Instructions (Signed)
Your procedure is scheduled on: 11-29-17 MONDAY Report to Same Day Surgery 2nd floor medical mall [facility] Entrance-take elevator on left to 2nd floor.  Check in with surgery information desk.) To find out your arrival time please call [phone] between 1PM - 3PM on 11-26-17 FRIDAY  Remember: Instructions that are not followed completely may result in serious medical risk, up to and including death, or upon the discretion of your surgeon and anesthesiologist your surgery may need to be rescheduled.    _x___ 1. Do not eat food after midnight the night before your procedure. NO GUM OR CANDY AFTER MIDNIGHT.  You may drink clear liquids up to 2 hours before you are scheduled to arrive at the hospital for your procedure.  Do not drink clear liquids within 2 hours of your scheduled arrival to the hospital.  Clear liquids include  --Water or Apple juice without pulp  --Clear carbohydrate beverage such as ClearFast or Gatorade  --Black Coffee or Clear Tea (No milk, no creamers, do not add anything to the coffee or Tea    __x__ 2. No Alcohol for 24 hours before or after surgery.   __x__3. No Smoking or e-cigarettes for 24 prior to surgery.  Do not use any chewable tobacco products for at least 6 hour prior to surgery   ____  4. Bring all medications with you on the day of surgery if instructed.    __x__ 5. Notify your doctor if there is any change in your medical condition     (cold, fever, infections).    x___6. On the morning of surgery brush your teeth with toothpaste and water.  You may rinse your mouth with mouth wash if you wish.  Do not swallow any toothpaste or mouthwash.   Do not wear jewelry, make-up, hairpins, clips or nail polish.  Do not wear lotions, powders, or perfumes. You may wear deodorant.  Do not shave 48 hours prior to surgery. Men may shave face and neck.  Do not bring valuables to the hospital.    [facility] is not responsible for any belongings or valuables.               Contacts, dentures or bridgework may not be worn into surgery.  Leave your suitcase in the car. After surgery it may be brought to your room.  For patients admitted to the hospital, discharge time is determined by your  treatment team.  _  Patients discharged the day of surgery will not be allowed to drive home.  You will need someone to drive you home and stay with you the night of your procedure.    Please read over the following fact sheets that you were given:   [facility] Preparing for Surgery and or MRSA Information   _x___ TAKE THE FOLLOWING MEDICATION THE MORNING OF SURGERY WITH A SMALL SIP OF WATER. These include:  1. PROTONIX  2. TAKE A PROTONIX AT BEDTIME THE NIGHT BEFORE SURGERY  3.  4.  5.  6.  ____Fleets enema or Magnesium Citrate as directed.   ____ Use CHG Soap or sage wipes as directed on instruction sheet   ____ Use inhalers on the day of surgery and bring to hospital day of surgery  ____ Stop Metformin and Janumet 2 days prior to surgery.    ____ Take 1/2 of usual insulin dose the night before surgery and none on the morning surgery.   ____ Follow recommendations from Cardiologist, Pulmonologist or PCP regarding  stopping Aspirin, Coumadin, Plavix ,Eliquis, Effient, or Pradaxa, and Pletal.  X____Stop Anti-inflammatories such as Advil, Aleve, IBUPROFEN, Motrin, Naproxen, Naprosyn, Goodies powders or aspirin products NOW-OK to take Tylenol OR TRAMADOL IF NEEDED   ____ Stop supplements until after surgery.     ____ Bring C-Pap to the hospital.

## 2017-11-18 MED FILL — PANTOPRAZOLE SOD DR 40 MG T: 40 | 90 days supply | Qty: 90 | Fill #1

## 2017-11-18 MED FILL — HYDROCHLOROTHIAZIDE 25 MG T: 25 | 90 days supply | Qty: 90 | Fill #1

## 2017-11-19 ENCOUNTER — Telehealth: Payer: Self-pay | Admitting: Family Medicine

## 2017-11-19 NOTE — Telephone Encounter (Unsigned)
Copied from [place] [id number]. Topic: Quick Communication - See Telephone Encounter >> Nov 19, 2017  4:04 PM Percell Belt A wrote: CRM for notification. See Telephone encounter for: 11/19/17. Pt called and would like someone to give her a call with her mri results?    Best number - [phone]

## 2017-11-22 ENCOUNTER — Telehealth: Payer: Self-pay

## 2017-11-22 NOTE — Telephone Encounter (Signed)
Left message for patient to call back for MRI results.  ?

## 2017-11-22 NOTE — Telephone Encounter (Signed)
Spoke with patient about her MRI results. She would like to hold on scheduling and appointment with Dr. Tamala Julian at this time as she is having surgery on Monday. She inquires about wearing the boot as this alleviates her pain. I told her to wear the boot as needed if it is helping and to call us after her surgery to schedule an appointment. Patient voices understanding.

## 2017-11-23 ENCOUNTER — Encounter
Admission: RE | Admit: 2017-11-23 | Discharge: 2017-11-23 | Disposition: A | Discharge status: Home / Self Care | Payer: 59 | Source: Ambulatory Visit | Attending: Otolaryngology | Admitting: Otolaryngology

## 2017-11-23 DIAGNOSIS — Z01818 Encounter for other preprocedural examination: Secondary | ICD-10-CM | POA: Diagnosis not present

## 2017-11-23 DIAGNOSIS — J381 Polyp of vocal cord and larynx: Secondary | ICD-10-CM | POA: Insufficient documentation

## 2017-11-23 DIAGNOSIS — I4581 Long QT syndrome: Secondary | ICD-10-CM | POA: Diagnosis not present

## 2017-11-23 DIAGNOSIS — I10 Essential (primary) hypertension: Secondary | ICD-10-CM | POA: Insufficient documentation

## 2017-11-23 LAB — BASIC METABOLIC PANEL
Anion gap: 9 (ref 5–15)
BUN: 10 mg/dL (ref 6–20)
CO2: 28 mmol/L (ref 22–32)
Calcium: 9.5 mg/dL (ref 8.9–10.3)
Chloride: 100 mmol/L — ABNORMAL LOW (ref 101–111)
Creatinine, Ser: 0.77 mg/dL (ref 0.44–1.00)
GFR calc Af Amer: >60 mL/min (ref >60)
GFR calc non Af Amer: >60 mL/min (ref >60)
GLUCOSE: 121 mg/dL — AB (ref 65–99)
POTASSIUM: 3.3 mmol/L — AB (ref 3.5–5.1)
Sodium: 137 mmol/L (ref 135–145)

## 2017-11-23 LAB — HEMOGLOBIN: Hemoglobin: 13.2 g/dL (ref 12.0–16.0)

## 2017-11-26 MED ORDER — PHENYLEPHRINE HCL 10 % OP SOLN
Freq: Once | OPHTHALMIC | Status: DC
  Filled 2017-11-26: qty 10

## 2017-11-29 ENCOUNTER — Encounter
Admission: RE | Disposition: A | Discharge status: Home / Self Care | Payer: Self-pay | Source: Ambulatory Visit | Attending: Otolaryngology

## 2017-11-29 ENCOUNTER — Ambulatory Visit: Payer: 59 | Admitting: Anesthesiology

## 2017-11-29 ENCOUNTER — Other Ambulatory Visit: Payer: Self-pay

## 2017-11-29 ENCOUNTER — Encounter: Payer: Self-pay | Admitting: *Deleted

## 2017-11-29 ENCOUNTER — Ambulatory Visit
Admission: RE | Admit: 2017-11-29 | Discharge: 2017-11-29 | Disposition: A | Discharge status: Home / Self Care | Payer: 59 | Source: Ambulatory Visit | Attending: Otolaryngology | Admitting: Otolaryngology

## 2017-11-29 DIAGNOSIS — R490 Dysphonia: Secondary | ICD-10-CM | POA: Diagnosis present

## 2017-11-29 DIAGNOSIS — I10 Essential (primary) hypertension: Secondary | ICD-10-CM | POA: Insufficient documentation

## 2017-11-29 DIAGNOSIS — Z79899 Other long term (current) drug therapy: Secondary | ICD-10-CM | POA: Insufficient documentation

## 2017-11-29 DIAGNOSIS — J382 Nodules of vocal cords: Secondary | ICD-10-CM | POA: Diagnosis not present

## 2017-11-29 DIAGNOSIS — J381 Polyp of vocal cord and larynx: Secondary | ICD-10-CM | POA: Insufficient documentation

## 2017-11-29 DIAGNOSIS — Z8249 Family history of ischemic heart disease and other diseases of the circulatory system: Secondary | ICD-10-CM | POA: Insufficient documentation

## 2017-11-29 DIAGNOSIS — G4733 Obstructive sleep apnea (adult) (pediatric): Secondary | ICD-10-CM | POA: Diagnosis not present

## 2017-11-29 HISTORY — PX: MICROLARYNGOSCOPY: SHX5208

## 2017-11-29 LAB — POCT I-STAT 4, (NA,K, GLUC, HGB,HCT)
Glucose, Bld: 148 mg/dL — ABNORMAL HIGH (ref 65–99)
HCT: 41 % (ref 36.0–46.0)
HEMOGLOBIN: 13.9 g/dL (ref 12.0–15.0)
POTASSIUM: 3.5 mmol/L (ref 3.5–5.1)
Sodium: 140 mmol/L (ref 135–145)

## 2017-11-29 SURGERY — MICROLARYNGOSCOPY
Anesthesia: General | Wound class: Clean Contaminated

## 2017-11-29 MED ORDER — FENTANYL CITRATE (PF) 100 MCG/2ML IJ SOLN
INTRAMUSCULAR | Status: DC | PRN
  Administered 2017-11-29: 50 ug via INTRAVENOUS

## 2017-11-29 MED ORDER — LIDOCAINE HCL (CARDIAC) 20 MG/ML IV SOLN
INTRAVENOUS | Status: DC | PRN
  Administered 2017-11-29: 100 mg via INTRAVENOUS

## 2017-11-29 MED ORDER — MIDAZOLAM HCL 2 MG/2ML IJ SOLN
INTRAMUSCULAR | Status: DC | PRN
  Administered 2017-11-29: 2 mg via INTRAVENOUS

## 2017-11-29 MED ORDER — PROPOFOL 10 MG/ML IV BOLUS
INTRAVENOUS | Status: AC
  Filled 2017-11-29: qty 40

## 2017-11-29 MED ORDER — ROCURONIUM BROMIDE 50 MG/5ML IV SOLN
INTRAVENOUS | Status: AC
  Filled 2017-11-29: qty 1

## 2017-11-29 MED ORDER — EPHEDRINE SULFATE 50 MG/ML IJ SOLN
INTRAMUSCULAR | Status: AC
  Filled 2017-11-29: qty 1

## 2017-11-29 MED ORDER — DEXAMETHASONE SODIUM PHOSPHATE 10 MG/ML IJ SOLN
INTRAMUSCULAR | Status: AC
  Filled 2017-11-29: qty 1

## 2017-11-29 MED ORDER — SUGAMMADEX SODIUM 500 MG/5ML IV SOLN
INTRAVENOUS | Status: DC | PRN
  Administered 2017-11-29: 500 mg via INTRAVENOUS

## 2017-11-29 MED ORDER — GLYCOPYRROLATE 0.2 MG/ML IJ SOLN
INTRAMUSCULAR | Status: DC | PRN
  Administered 2017-11-29: 0.2 mg via INTRAVENOUS

## 2017-11-29 MED ORDER — PHENYLEPHRINE HCL 10 % OP SOLN
OPHTHALMIC | Status: DC | PRN
  Administered 2017-11-29: 10.5 mL via TOPICAL

## 2017-11-29 MED ORDER — ONDANSETRON HCL 4 MG/2ML IJ SOLN
INTRAMUSCULAR | Status: AC
Start: 2017-11-29 — End: 2017-11-29
  Filled 2017-11-29: qty 2

## 2017-11-29 MED ORDER — SUGAMMADEX SODIUM 500 MG/5ML IV SOLN
INTRAVENOUS | Status: AC
  Filled 2017-11-29: qty 5

## 2017-11-29 MED ORDER — PROMETHAZINE HCL 25 MG/ML IJ SOLN: 6.2500 mg | INTRAMUSCULAR | Status: DC | PRN

## 2017-11-29 MED ORDER — LIDOCAINE HCL (PF) 4 % IJ SOLN
INTRAMUSCULAR | Status: AC
Start: 2017-11-29 — End: 2017-11-29
  Filled 2017-11-29: qty 5

## 2017-11-29 MED ORDER — OXYCODONE HCL 5 MG/5ML PO SOLN: 5.0000 mg | Freq: Once | ORAL | Status: DC | PRN

## 2017-11-29 MED ORDER — GLYCOPYRROLATE 0.2 MG/ML IJ SOLN
INTRAMUSCULAR | Status: AC
  Filled 2017-11-29: qty 1

## 2017-11-29 MED ORDER — PHENYLEPHRINE HCL 10 MG/ML IJ SOLN
INTRAMUSCULAR | Status: AC
  Filled 2017-11-29: qty 1

## 2017-11-29 MED ORDER — PHENYLEPHRINE HCL 10 MG/ML IJ SOLN
INTRAMUSCULAR | Status: AC
Start: 2017-11-29 — End: 2017-11-29
  Filled 2017-11-29: qty 1

## 2017-11-29 MED ORDER — FENTANYL CITRATE (PF) 100 MCG/2ML IJ SOLN
INTRAMUSCULAR | Status: AC
  Filled 2017-11-29: qty 2

## 2017-11-29 MED ORDER — MIDAZOLAM HCL 2 MG/2ML IJ SOLN
INTRAMUSCULAR | Status: AC
  Filled 2017-11-29: qty 2

## 2017-11-29 MED ORDER — PROPOFOL 10 MG/ML IV BOLUS
INTRAVENOUS | Status: DC | PRN
  Administered 2017-11-29: 200 mg via INTRAVENOUS

## 2017-11-29 MED ORDER — LIDOCAINE HCL (PF) 4 % IJ SOLN
INTRAMUSCULAR | Status: AC
  Filled 2017-11-29: qty 5

## 2017-11-29 MED ORDER — FENTANYL CITRATE (PF) 100 MCG/2ML IJ SOLN
INTRAMUSCULAR | Status: AC
  Administered 2017-11-29: 25 ug via INTRAVENOUS
  Filled 2017-11-29: qty 2

## 2017-11-29 MED ORDER — LACTATED RINGERS IV SOLN
INTRAVENOUS | Status: DC
  Administered 2017-11-29: 07:00:00 via INTRAVENOUS

## 2017-11-29 MED ORDER — OXYCODONE HCL 5 MG PO TABS: 5.0000 mg | ORAL_TABLET | Freq: Once | ORAL | Status: DC | PRN

## 2017-11-29 MED ORDER — ONDANSETRON HCL 4 MG/2ML IJ SOLN
INTRAMUSCULAR | Status: DC | PRN
  Administered 2017-11-29: 4 mg via INTRAVENOUS

## 2017-11-29 MED ORDER — LIDOCAINE HCL (PF) 2 % IJ SOLN
INTRAMUSCULAR | Status: AC
  Filled 2017-11-29: qty 10

## 2017-11-29 MED ORDER — FENTANYL CITRATE (PF) 100 MCG/2ML IJ SOLN
25.0000 ug | INTRAMUSCULAR | Status: DC | PRN
  Administered 2017-11-29: 25 ug via INTRAVENOUS
  Administered 2017-11-29: 50 ug via INTRAVENOUS
  Administered 2017-11-29: 25 ug via INTRAVENOUS

## 2017-11-29 MED ORDER — ROCURONIUM BROMIDE 100 MG/10ML IV SOLN
INTRAVENOUS | Status: DC | PRN
  Administered 2017-11-29: 50 mg via INTRAVENOUS
  Administered 2017-11-29: 20 mg via INTRAVENOUS

## 2017-11-29 MED ORDER — MEPERIDINE HCL 50 MG/ML IJ SOLN: 6.2500 mg | INTRAMUSCULAR | Status: DC | PRN

## 2017-11-29 MED ORDER — DEXAMETHASONE SODIUM PHOSPHATE 10 MG/ML IJ SOLN
INTRAMUSCULAR | Status: DC | PRN
  Administered 2017-11-29: 10 mg via INTRAVENOUS

## 2017-11-29 MED ORDER — SUCCINYLCHOLINE CHLORIDE 20 MG/ML IJ SOLN
INTRAMUSCULAR | Status: AC
  Filled 2017-11-29: qty 1

## 2017-11-29 SURGICAL SUPPLY — 19 items
BASIN GRAD PLASTIC 32OZ STRL (MISCELLANEOUS) ×2 IMPLANT
CNTNR SPEC 2.5X3XGRAD LEK (MISCELLANEOUS) ×1
CONT SPEC 4OZ STER OR WHT (MISCELLANEOUS) ×1
CONTAINER SPEC 2.5X3XGRAD LEK (MISCELLANEOUS) ×1 IMPLANT
DRAPE TABLE BACK 80X90 (DRAPES) ×2 IMPLANT
DRSG TELFA 4X3 1S NADH ST (GAUZE/BANDAGES/DRESSINGS) ×2 IMPLANT
GAUZE SPONGE 4X4 12PLY STRL (GAUZE/BANDAGES/DRESSINGS) ×2 IMPLANT
GLOVE PROTEXIS LATEX SZ 7.5 (GLOVE) ×6 IMPLANT
GOWN STRL REUS W/ TWL LRG LVL3 (GOWN DISPOSABLE) ×3 IMPLANT
GOWN STRL REUS W/TWL LRG LVL3 (GOWN DISPOSABLE) ×3
KIT TURNOVER KIT A (KITS) ×2 IMPLANT
NDL SAFETY ECLIPSE 18X1.5 (NEEDLE) ×1 IMPLANT
NEEDLE FILTER BLUNT 18X 1/2SAF (NEEDLE) ×1
NEEDLE FILTER BLUNT 18X1 1/2 (NEEDLE) ×1 IMPLANT
NEEDLE HYPO 18GX1.5 SHARP (NEEDLE) ×1
PATTIES SURGICAL .5 X.5 (GAUZE/BANDAGES/DRESSINGS) ×2 IMPLANT
SOL ANTI-FOG 6CC FOG-OUT (MISCELLANEOUS) ×1 IMPLANT
SOL FOG-OUT ANTI-FOG 6CC (MISCELLANEOUS) ×1
TUBING CONNECTING 10 (TUBING) ×2 IMPLANT

## 2017-11-29 NOTE — Discharge Instructions (Signed)

## 2017-11-29 NOTE — Anesthesia Procedure Notes (Signed)
Procedure Name: Intubation Date/Time: 11/29/2017 7:32 AM Performed by: Doreen Salvage, CRNA Pre-anesthesia Checklist: Patient identified, Patient being monitored, Timeout performed, Emergency Drugs available and Suction available Patient Re-evaluated:Patient Re-evaluated prior to induction Oxygen Delivery Method: Circle system utilized Preoxygenation: Pre-oxygenation with 100% oxygen Induction Type: IV induction Ventilation: Mask ventilation without difficulty Laryngoscope Size: Mac and 3 Grade View: Grade I Tube type: Oral Tube size: 7.0 mm Number of attempts: 1 Airway Equipment and Method: Stylet Placement Confirmation: ETT inserted through vocal cords under direct vision,  positive ETCO2 and breath sounds checked- equal and bilateral Secured at: 21 cm Tube secured with: Tape Dental Injury: Teeth and Oropharynx as per pre-operative assessment

## 2017-11-29 NOTE — Anesthesia Postprocedure Evaluation (Signed)
Anesthesia Post Note  Patient: Barbara Reese  Procedure(s) Performed: MICROLARYNGOSCOPY (N/A )  Patient location during evaluation: PACU Anesthesia Type: General Level of consciousness: awake and alert and oriented Pain management: pain level controlled Vital Signs Assessment: post-procedure vital signs reviewed and stable Respiratory status: spontaneous breathing, nonlabored ventilation and respiratory function stable Cardiovascular status: blood pressure returned to baseline and stable Postop Assessment: no signs of nausea or vomiting Anesthetic complications: no     Last Vitals:  Vitals:   11/29/17 0849 11/29/17 0855  BP: 125/87 131/74  Pulse: 95 96  Resp:    Temp: 36.5 C (!) 36.3 C  SpO2: 96% 99%    Last Pain:  Vitals:   11/29/17 0855  TempSrc: Temporal  PainSc: 0-No pain                 Nitin Mckowen

## 2017-11-29 NOTE — Anesthesia Post-op Follow-up Note (Signed)
Anesthesia QCDR form completed.        

## 2017-11-29 NOTE — Transfer of Care (Signed)
Immediate Anesthesia Transfer of Care Note  Patient: Barbara Reese  Procedure(s) Performed: Procedure(s): MICROLARYNGOSCOPY (N/A)  Patient Location: PACU  Anesthesia Type:General  Level of Consciousness: sedated  Airway & Oxygen Therapy: Patient Spontanous Breathing and Patient connected to face mask oxygen  Post-op Assessment: Report given to RN and Post -op Vital signs reviewed and stable  Post vital signs: Reviewed and stable  Last Vitals:  Vitals:   11/29/17 0819 11/29/17 0822  BP:  124/87  Pulse:  99  Resp:    Temp: (!) 36.2 C   SpO2:  794%    Complications: No apparent anesthesia complications

## 2017-11-29 NOTE — Op Note (Signed)
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  She was given general anesthesia by oral endotracheal intubation.  A very small oral endotracheal tube was used with a large soft cuff.  A tooth guard was placed and a Hollinger anterior commissure scope was used for visualizing the anterior larynx.  The anterior commissure was clear.  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  The right cord was completely clear.  The subglottic area was clear.  A small grasping forceps was used to grab the granuloma and pull it medially.  A micro up-biting scissors was used to trim the mucosa underneath it to separated from the cord.  This created a small horizontal incision along the surface of the cord.  Pictures were taken before and after.  The area had minimal bleeding and a cottonoid pledget soaked in phenylephrine and lidocaine was placed over the cord temporarily for vasoconstriction.  Total blood loss was very minimal.  Vocal cords now looked very healthy on both sides.  Patient was awakened and taken to the recovery room in satisfactory condition.  There were no operative complications.  Dispo:   To PACU to be discharged home  Plan: To follow-up in the office in 1 week.  She is to have strict voice rest for this next week.  She can eat and drink normally.  Barbara Reese  11/29/2017 8:08 AM

## 2017-11-29 NOTE — H&P (Signed)
H&P has been reviewedand patient reevaluated,  and no changes necessary. To be downloaded later.  

## 2017-11-29 NOTE — Anesthesia Preprocedure Evaluation (Signed)
Anesthesia Evaluation  Patient identified by MRN, date of birth, ID band Patient awake    Reviewed: Allergy & Precautions, NPO status , Patient's Chart, lab work & pertinent test results  History of Anesthesia Complications (+) PONV and history of anesthetic complications  Airway Mallampati: IV  TM Distance: >3 FB Neck ROM: Full    Dental no notable dental hx.    Pulmonary sleep apnea (does not tolerate CPAP) , neg COPD,    breath sounds clear to auscultation- rhonchi (-) wheezing      Cardiovascular hypertension, Pt. on medications (-) CAD, (-) Past MI, (-) Cardiac Stents and (-) CABG  Rhythm:Regular Rate:Normal - Systolic murmurs and - Diastolic murmurs    Neuro/Psych PSYCHIATRIC DISORDERS Anxiety Depression negative neurological ROS     GI/Hepatic Neg liver ROS, GERD  ,  Endo/Other  negative endocrine ROSneg diabetes  Renal/GU negative Renal ROS     Musculoskeletal  (+) Arthritis ,   Abdominal (+) + obese,   Peds  Hematology  (+) anemia ,   Anesthesia Other Findings Past Medical History: No date: Achilles tendon disorder, left     Comment:  PT HAS ON BOOT No date: Allergic rhinitis No date: Anemia     Comment:  H/O No date: Anxiety No date: Depressive disorder     Comment:  Hx 01/02/2016: Endometrial hyperplasia with atypia     Comment:  Followed by GYN, planning for likely hysterectomy,               currently treated with Megace  No date: GERD (gastroesophageal reflux disease) No date: Hepatitis     Comment:  age 52- dx'd with chronic hepatitis- no problems as an               adult No date: Hypertension No date: IBS (irritable bowel syndrome)     Comment:  diet controlled, no meds No date: Insomnia 10/01/4130: Lichen planus hypertrophicus No date: OSA (obstructive sleep apnea)     Comment:  Does not use CPAP. No date: Osteoarthritis     Comment:  knees No date: PONV (postoperative nausea and  vomiting) No date: SVD (spontaneous vaginal delivery)     Comment:  x 1   Reproductive/Obstetrics                             Anesthesia Physical Anesthesia Plan  ASA: II  Anesthesia Plan: General   Post-op Pain Management:    Induction: Intravenous  PONV Risk Score and Plan: 3 and Ondansetron, Dexamethasone and Midazolam  Airway Management Planned: Oral ETT  Additional Equipment:   Intra-op Plan:   Post-operative Plan: Extubation in OR  Informed Consent: I have reviewed the patients History and Physical, chart, labs and discussed the procedure including the risks, benefits and alternatives for the proposed anesthesia with the patient or authorized representative who has indicated his/her understanding and acceptance.   Dental advisory given  Plan Discussed with: CRNA and Anesthesiologist  Anesthesia Plan Comments:         Anesthesia Quick Evaluation

## 2017-11-30 DIAGNOSIS — G4733 Obstructive sleep apnea (adult) (pediatric): Secondary | ICD-10-CM | POA: Diagnosis not present

## 2017-12-06 DIAGNOSIS — Z48813 Encounter for surgical aftercare following surgery on the respiratory system: Secondary | ICD-10-CM | POA: Diagnosis not present

## 2017-12-06 LAB — SURGICAL PATHOLOGY

## 2017-12-07 ENCOUNTER — Telehealth: Payer: Self-pay

## 2017-12-07 NOTE — Telephone Encounter (Signed)
Patient left message wanting PCP to prescribe sleep aid due to insomnia. Stated she had made appt for 5/21 but did not want to wait that long.   Called patient back to offer sooner appt with another provider to discuss insomnia. Patient stated she had a sleep study last week and was just notified results are in. She will f/u on those results first to see if she has sleep apnea and will f/u with [place] if necessary for a sooner appt for insomnia.  Danley Danker, RN [facility] RN)

## 2017-12-09 ENCOUNTER — Other Ambulatory Visit: Payer: Self-pay | Admitting: Family Medicine

## 2017-12-09 MED FILL — traMADol HCL 50 MG TABS: 50 | 10 days supply | Qty: 30 | Fill #0

## 2018-01-11 ENCOUNTER — Encounter: Payer: 59 | Admitting: Family Medicine

## 2018-01-12 ENCOUNTER — Other Ambulatory Visit: Payer: Self-pay | Admitting: Family Medicine

## 2018-01-12 DIAGNOSIS — Z1231 Encounter for screening mammogram for malignant neoplasm of breast: Secondary | ICD-10-CM

## 2018-02-01 ENCOUNTER — Ambulatory Visit (INDEPENDENT_AMBULATORY_CARE_PROVIDER_SITE_OTHER): Payer: 59 | Admitting: Family Medicine

## 2018-02-01 ENCOUNTER — Encounter: Payer: Self-pay | Admitting: Family Medicine

## 2018-02-01 VITALS — BP 125/85 | HR 92 | Temp 98.8°F | Ht 66.0 in | Wt 280.2 lb

## 2018-02-01 DIAGNOSIS — Z0000 Encounter for general adult medical examination without abnormal findings: Secondary | ICD-10-CM | POA: Diagnosis not present

## 2018-02-01 DIAGNOSIS — I10 Essential (primary) hypertension: Secondary | ICD-10-CM | POA: Diagnosis not present

## 2018-02-01 DIAGNOSIS — K625 Hemorrhage of anus and rectum: Secondary | ICD-10-CM

## 2018-02-01 DIAGNOSIS — M7752 Other enthesopathy of left foot: Secondary | ICD-10-CM | POA: Diagnosis not present

## 2018-02-01 DIAGNOSIS — F418 Other specified anxiety disorders: Secondary | ICD-10-CM

## 2018-02-01 DIAGNOSIS — K921 Melena: Secondary | ICD-10-CM | POA: Diagnosis not present

## 2018-02-01 DIAGNOSIS — R7303 Prediabetes: Secondary | ICD-10-CM

## 2018-02-01 DIAGNOSIS — K219 Gastro-esophageal reflux disease without esophagitis: Secondary | ICD-10-CM | POA: Diagnosis not present

## 2018-02-01 DIAGNOSIS — G4733 Obstructive sleep apnea (adult) (pediatric): Secondary | ICD-10-CM | POA: Diagnosis not present

## 2018-02-01 MED ORDER — LORAZEPAM 0.5 MG PO TABS: ORAL_TABLET | ORAL | 0 refills | Status: DC

## 2018-02-01 NOTE — Assessment & Plan Note (Signed)
Well controlled on current regimen. Using shared decision-making, agreed to continue medication despite potential risks given significant symptom relief provided and ineffectiveness of H2 antagonists in controlling her sx.

## 2018-02-01 NOTE — Assessment & Plan Note (Signed)
Encouraged patient to schedule follow up appointment with [facility] who is following her for ongoing foot pain. They had suggested a repeat steroid injection a few months ago, but this was postponed due to microlaryngoscopy on 11/29/17. - Will complete paperwork for handicap tag renewal since current sticker will expire at end of month

## 2018-02-01 NOTE — Assessment & Plan Note (Signed)
Given history of OSA and risk associated with medications that may depress respiratory drive, will not prescribe sleep aid medication. Explained this to patient, who voiced understanding. Provided handout on sleep hygiene to help her improve sleep.

## 2018-02-01 NOTE — Patient Instructions (Signed)
Will do handicap sticker On your way out, schedule an appointment one morning to come back for fasting labs. Do not eat or drink anything other than water the morning of your lab appointment until after your labs are drawn.  Referring to GI for colonoscopy  See handout on sleep hygiene  Sent in ativan for your trip  Be well, Dr. [name]   Health Maintenance, Female Adopting a healthy lifestyle and getting preventive care can go a long way to promote health and wellness. Talk with your health care provider about what schedule of regular examinations is right for you. This is a good chance for you to check in with your provider about disease prevention and staying healthy. In between checkups, there are plenty of things you can do on your own. Experts have done a lot of research about which lifestyle changes and preventive measures are most likely to keep you healthy. Ask your health care provider for more information. Weight and diet Eat a healthy diet  Be sure to include plenty of vegetables, fruits, low-fat dairy products, and lean protein.  Do not eat a lot of foods high in solid fats, added sugars, or salt.  Get regular exercise. This is one of the most important things you can do for your health. ? Most adults should exercise for at least 150 minutes each week. The exercise should increase your heart rate and make you sweat (moderate-intensity exercise). ? Most adults should also do strengthening exercises at least twice a week. This is in addition to the moderate-intensity exercise.  Maintain a healthy weight  Body mass index (BMI) is a measurement that can be used to identify possible weight problems. It estimates body fat based on height and weight. Your health care provider can help determine your BMI and help you achieve or maintain a healthy weight.  For females 93 years of age and older: ? A BMI below 18.5 is considered underweight. ? A BMI of 18.5 to 24.9 is normal. ? A  BMI of 25 to 29.9 is considered overweight. ? A BMI of 30 and above is considered obese.  Watch levels of cholesterol and blood lipids  You should start having your blood tested for lipids and cholesterol at 52 years of age, then have this test every 5 years.  You may need to have your cholesterol levels checked more often if: ? Your lipid or cholesterol levels are high. ? You are older than 52 years of age. ? You are at high risk for heart disease.  Cancer screening Lung Cancer  Lung cancer screening is recommended for adults 37-57 years old who are at high risk for lung cancer because of a history of smoking.  A yearly low-dose CT scan of the lungs is recommended for people who: ? Currently smoke. ? Have quit within the past 15 years. ? Have at least a 30-pack-year history of smoking. A pack year is smoking an average of one pack of cigarettes a day for 1 year.  Yearly screening should continue until it has been 15 years since you quit.  Yearly screening should stop if you develop a health problem that would prevent you from having lung cancer treatment.  Breast Cancer  Practice breast self-awareness. This means understanding how your breasts normally appear and feel.  It also means doing regular breast self-exams. Let your health care provider know about any changes, no matter how small.  If you are in your 20s or 30s, you should have a  clinical breast exam (CBE) by a health care provider every 1-3 years as part of a regular health exam.  If you are 26 or older, have a CBE every year. Also consider having a breast X-ray (mammogram) every year.  If you have a family history of breast cancer, talk to your health care provider about genetic screening.  If you are at high risk for breast cancer, talk to your health care provider about having an MRI and a mammogram every year.  Breast cancer gene (BRCA) assessment is recommended for women who have family members with  BRCA-related cancers. BRCA-related cancers include: ? Breast. ? Ovarian. ? Tubal. ? Peritoneal cancers.  Results of the assessment will determine the need for genetic counseling and BRCA1 and BRCA2 testing.  Cervical Cancer Your health care provider may recommend that you be screened regularly for cancer of the pelvic organs (ovaries, uterus, and vagina). This screening involves a pelvic examination, including checking for microscopic changes to the surface of your cervix (Pap test). You may be encouraged to have this screening done every 3 years, beginning at age 30.  For women ages 20-65, health care providers may recommend pelvic exams and Pap testing every 3 years, or they may recommend the Pap and pelvic exam, combined with testing for human papilloma virus (HPV), every 5 years. Some types of HPV increase your risk of cervical cancer. Testing for HPV may also be done on women of any age with unclear Pap test results.  Other health care providers may not recommend any screening for nonpregnant women who are considered low risk for pelvic cancer and who do not have symptoms. Ask your health care provider if a screening pelvic exam is right for you.  If you have had past treatment for cervical cancer or a condition that could lead to cancer, you need Pap tests and screening for cancer for at least 20 years after your treatment. If Pap tests have been discontinued, your risk factors (such as having a new sexual partner) need to be reassessed to determine if screening should resume. Some women have medical problems that increase the chance of getting cervical cancer. In these cases, your health care provider may recommend more frequent screening and Pap tests.  Colorectal Cancer  This type of cancer can be detected and often prevented.  Routine colorectal cancer screening usually begins at 52 years of age and continues through 52 years of age.  Your health care provider may recommend screening  at an earlier age if you have risk factors for colon cancer.  Your health care provider may also recommend using home test kits to check for hidden blood in the stool.  A small camera at the end of a tube can be used to examine your colon directly (sigmoidoscopy or colonoscopy). This is done to check for the earliest forms of colorectal cancer.  Routine screening usually begins at age 3.  Direct examination of the colon should be repeated every 5-10 years through 52 years of age. However, you may need to be screened more often if early forms of precancerous polyps or small growths are found.  Skin Cancer  Check your skin from head to toe regularly.  Tell your health care provider about any new moles or changes in moles, especially if there is a change in a mole's shape or color.  Also tell your health care provider if you have a mole that is larger than the size of a pencil eraser.  Always use sunscreen.  Apply sunscreen liberally and repeatedly throughout the day.  Protect yourself by wearing long sleeves, pants, a wide-brimmed hat, and sunglasses whenever you are outside.  Heart disease, diabetes, and high blood pressure  High blood pressure causes heart disease and increases the risk of stroke. High blood pressure is more likely to develop in: ? People who have blood pressure in the high end of the normal range (130-139/85-89 mm Hg). ? People who are overweight or obese. ? People who are African American.  If you are 36-61 years of age, have your blood pressure checked every 3-5 years. If you are 33 years of age or older, have your blood pressure checked every year. You should have your blood pressure measured twice-once when you are at a hospital or clinic, and once when you are not at a hospital or clinic. Record the average of the two measurements. To check your blood pressure when you are not at a hospital or clinic, you can use: ? An automated blood pressure machine at a  pharmacy. ? A home blood pressure monitor.  If you are between 50 years and 65 years old, ask your health care provider if you should take aspirin to prevent strokes.  Have regular diabetes screenings. This involves taking a blood sample to check your fasting blood sugar level. ? If you are at a normal weight and have a low risk for diabetes, have this test once every three years after 52 years of age. ? If you are overweight and have a high risk for diabetes, consider being tested at a younger age or more often. Preventing infection Hepatitis B  If you have a higher risk for hepatitis B, you should be screened for this virus. You are considered at high risk for hepatitis B if: ? You were born in a country where hepatitis B is common. Ask your health care provider which countries are considered high risk. ? Your parents were born in a high-risk country, and you have not been immunized against hepatitis B (hepatitis B vaccine). ? You have HIV or AIDS. ? You use needles to inject street drugs. ? You live with someone who has hepatitis B. ? You have had sex with someone who has hepatitis B. ? You get hemodialysis treatment. ? You take certain medicines for conditions, including cancer, organ transplantation, and autoimmune conditions.  Hepatitis C  Blood testing is recommended for: ? Everyone born from [birth year] through 1965. ? Anyone with known risk factors for hepatitis C.  Sexually transmitted infections (STIs)  You should be screened for sexually transmitted infections (STIs) including gonorrhea and chlamydia if: ? You are sexually active and are younger than 52 years of age. ? You are older than 52 years of age and your health care provider tells you that you are at risk for this type of infection. ? Your sexual activity has changed since you were last screened and you are at an increased risk for chlamydia or gonorrhea. Ask your health care provider if you are at risk.  If you do not  have HIV, but are at risk, it may be recommended that you take a prescription medicine daily to prevent HIV infection. This is called pre-exposure prophylaxis (PrEP). You are considered at risk if: ? You are sexually active and do not regularly use condoms or know the HIV status of your partner(s). ? You take drugs by injection. ? You are sexually active with a partner who has HIV.  Talk with your health care  provider about whether you are at high risk of being infected with HIV. If you choose to begin PrEP, you should first be tested for HIV. You should then be tested every 3 months for as long as you are taking PrEP. Pregnancy  If you are premenopausal and you may become pregnant, ask your health care provider about preconception counseling.  If you may become pregnant, take 400 to 800 micrograms (mcg) of folic acid every day.  If you want to prevent pregnancy, talk to your health care provider about birth control (contraception). Osteoporosis and menopause  Osteoporosis is a disease in which the bones lose minerals and strength with aging. This can result in serious bone fractures. Your risk for osteoporosis can be identified using a bone density scan.  If you are 33 years of age or older, or if you are at risk for osteoporosis and fractures, ask your health care provider if you should be screened.  Ask your health care provider whether you should take a calcium or vitamin D supplement to lower your risk for osteoporosis.  Menopause may have certain physical symptoms and risks.  Hormone replacement therapy may reduce some of these symptoms and risks. Talk to your health care provider about whether hormone replacement therapy is right for you. Follow these instructions at home:  Schedule regular health, dental, and eye exams.  Stay current with your immunizations.  Do not use any tobacco products including cigarettes, chewing tobacco, or electronic cigarettes.  If you are pregnant,  do not drink alcohol.  If you are breastfeeding, limit how much and how often you drink alcohol.  Limit alcohol intake to no more than 1 drink per day for nonpregnant women. One drink equals 12 ounces of beer, 5 ounces of wine, or 1 ounces of hard liquor.  Do not use street drugs.  Do not share needles.  Ask your health care provider for help if you need support or information about quitting drugs.  Tell your health care provider if you often feel depressed.  Tell your health care provider if you have ever been abused or do not feel safe at home. This information is not intended to replace advice given to you by your health care provider. Make sure you discuss any questions you have with your health care provider. Document Released: 02/23/2011 Document Revised: 01/16/2016 Document Reviewed: 05/14/2015 Elsevier Interactive Patient Education  [employer].

## 2018-02-01 NOTE — Assessment & Plan Note (Signed)
Will prescribe 5 tablets of low-dose lorazepam for patient's upcoming flight to [place].

## 2018-02-01 NOTE — Progress Notes (Signed)
Date of Visit: 02/01/2018   HPI:  Patient presents today for a well woman exam.   Concerns today: -Difficulty with sleeping -Request for Ativan -Handicap parking tag renewal -Concern about safety of PPIs  Difficulty with sleeping - Has difficulty falling asleep after working 2nd shift; she gets home around 12am, showers, and goes straight to bed but often lies awake until 4am despite feeling tired. She often wakes up with headache and feeling fatigued after these nights. She would like medication to help her fall asleep on these evenings. - Has no issues with falling asleep on other nights of week; lays down around 10pm and is asleep by 11pm. She wakes up feeling refreshed and without a headache. - Had sleep study done previously which showed OSA; does not currently use CPAP, but plans to get one around August when she can afford $400 co-pay  Request for Ativan - History of situational anxiety related to airplanes/air travel - Has an upcoming trip to [place] which she would like Ativan for to help control her anxiety  Handicap parking tag renewal - Has had handicap parking tag for past 6 months due to ongoing pain and swelling in foot/ankle - Would like to renew this for another 6 months since sometimes still wears walking boot on foot due to swelling - Has plans to follow up with Dr. Tamala Julian (sports medicine) regarding her ankle  Concerns about PPI safety - Has recently read articles that suggested that PPIs may be harmful to kidneys; she would like to know if she should be worried or stop PPI  Blood in stool - noticed blood in stool one month ago, has not recurred since then - has never had colonoscopy, willing to have this done - has history of hemorrhoids previously diagnosed  Periods: Hysterectomy Contraception: Hysterectomy Pelvic symptoms: none STD Screening: declined Pap smear status: not indicated Exercise: Limited walking due to pain in ankle Diet: Healthy diet;  eating 1-2 meals a day and less food than she used to but notices that she is gaining weight Smoking: non-smoker (never smoked) Alcohol: 1-2 glasses of wine/week Drugs: none Mood: Had been feeling depressed/down before throat surgery but has been feeling much better since Dentist: Has appointment scheduled for Sept 2019  ROS: Positive for blood in stool  [place]:  Cancers in family: none  PHYSICAL EXAM: BP 125/85 (BP Location: Left Arm, Patient Position: Sitting, Cuff Size: Large)   Pulse 92   Temp 98.8 F (37.1 C) (Oral)   Ht 5\' 6"  (1.676 m)   Wt 280 lb 3.2 oz (127.1 kg)   LMP 02/09/2016 (Exact Date)   SpO2 96%   BMI 45.23 kg/m  Gen: NAD, pleasant, cooperative HEENT: NCAT, PERRL, no palpable thyromegaly or anterior cervical lymphadenopathy Heart: RRR, no murmurs Lungs: CTAB, NWOB Abdomen: soft, nontender to palpation Neuro: grossly nonfocal, speech normal Psych: normal range of affect, well groomed, speech normal in rate and volume, normal eye contact   ASSESSMENT/PLAN:  Rectal bleeding Known history of hemorrhoid, but need colonoscopy to rule out malignancy as cause of bleeding. - Placed GI referral for colonoscopy and counseled patient on importance of this screening test; patient voiced understanding. - Will check CBC  OBSTRUCTIVE SLEEP APNEA Given history of OSA and risk associated with medications that may depress respiratory drive, will not prescribe sleep aid medication. Explained this to patient, who voiced understanding. Provided handout on sleep hygiene to help her improve sleep.  Situational anxiety Will prescribe 5 tablets of low-dose lorazepam for  patient's upcoming flight to [place].  Retrocalcaneal bursitis (back of heel), left Encouraged patient to schedule follow up appointment with [facility] who is following her for ongoing foot pain. They had suggested a repeat steroid injection a few months ago, but this was postponed due to  microlaryngoscopy on 11/29/17. - Will complete paperwork for handicap tag renewal since current sticker will expire at end of month  GERD Well controlled on current regimen. Using shared decision-making, agreed to continue medication despite potential risks given significant symptom relief provided and ineffectiveness of H2 antagonists in controlling her sx.  Health maintenance  -STD screening: declines -pap smear: not indicated -mammogram: scheduled for 02/08/18 -colonoscopy: GI referral placed; counseled on importance of having this done soon given recent episode of blood in stool -lipid screening: patient to schedule appointment for fasting labs -immunizations: UTD -handout given on health maintenance topics  Michael Litter, [place]  Patient seen along with MS[employer] student Michael Litter. I personally evaluated this patient along with the student, and verified all aspects of the history, physical exam, and medical decision making as documented by the student. I agree with the student's documentation and have made all necessary edits.   Chrisandra Netters, MD [place]

## 2018-02-01 NOTE — Assessment & Plan Note (Signed)
Known history of hemorrhoid, but need colonoscopy to rule out malignancy as cause of bleeding. - Placed GI referral for colonoscopy and counseled patient on importance of this screening test; patient voiced understanding. - Will check CBC

## 2018-02-04 ENCOUNTER — Other Ambulatory Visit (INDEPENDENT_AMBULATORY_CARE_PROVIDER_SITE_OTHER): Payer: 59

## 2018-02-04 DIAGNOSIS — R7303 Prediabetes: Secondary | ICD-10-CM | POA: Diagnosis not present

## 2018-02-04 DIAGNOSIS — I10 Essential (primary) hypertension: Secondary | ICD-10-CM | POA: Diagnosis not present

## 2018-02-04 DIAGNOSIS — K625 Hemorrhage of anus and rectum: Secondary | ICD-10-CM

## 2018-02-04 LAB — POCT GLYCOSYLATED HEMOGLOBIN (HGB A1C): HBA1C, POC (CONTROLLED DIABETIC RANGE): 6.7 % (ref 0.0–7.0)

## 2018-02-05 LAB — CBC
HEMOGLOBIN: 12.8 g/dL (ref 11.1–15.9)
Hematocrit: 38.5 % (ref 34.0–46.6)
MCH: 27.8 pg (ref 26.6–33.0)
MCHC: 33.2 g/dL (ref 31.5–35.7)
MCV: 84 fL (ref 79–97)
Platelets: 356 10*3/uL (ref 150–450)
RBC: 4.61 x10E6/uL (ref 3.77–5.28)
RDW: 14.3 % (ref 12.3–15.4)
WBC: 5.8 10*3/uL (ref 3.4–10.8)

## 2018-02-05 LAB — LIPID PANEL
CHOL/HDL RATIO: 3 ratio (ref 0.0–4.4)
CHOLESTEROL TOTAL: 181 mg/dL (ref 100–199)
HDL: 60 mg/dL (ref >39)
LDL CALC: 97 mg/dL (ref 0–99)
Triglycerides: 121 mg/dL (ref 0–149)
VLDL CHOLESTEROL CAL: 24 mg/dL (ref 5–40)

## 2018-02-05 LAB — CMP14+EGFR
ALK PHOS: 52 IU/L (ref 39–117)
ALT: 15 IU/L (ref 0–32)
AST: 17 IU/L (ref 0–40)
Albumin/Globulin Ratio: 1.4 (ref 1.2–2.2)
Albumin: 4.2 g/dL (ref 3.5–5.5)
BUN/Creatinine Ratio: 9 (ref 9–23)
BUN: 8 mg/dL (ref 6–24)
Bilirubin Total: 0.4 mg/dL (ref 0.0–1.2)
CALCIUM: 9.4 mg/dL (ref 8.7–10.2)
CO2: 26 mmol/L (ref 20–29)
Chloride: 98 mmol/L (ref 96–106)
Creatinine, Ser: 0.88 mg/dL (ref 0.57–1.00)
GFR calc Af Amer: 87 mL/min/{1.73_m2} (ref >59)
GFR, EST NON AFRICAN AMERICAN: 76 mL/min/{1.73_m2} (ref >59)
GLOBULIN, TOTAL: 3 g/dL (ref 1.5–4.5)
GLUCOSE: 146 mg/dL — AB (ref 65–99)
Potassium: 3.9 mmol/L (ref 3.5–5.2)
SODIUM: 138 mmol/L (ref 134–144)
Total Protein: 7.2 g/dL (ref 6.0–8.5)

## 2018-02-08 ENCOUNTER — Ambulatory Visit
Admission: RE | Admit: 2018-02-08 | Discharge: 2018-02-08 | Disposition: A | Discharge status: Home / Self Care | Payer: 59 | Source: Ambulatory Visit | Attending: Family Medicine | Admitting: Family Medicine

## 2018-02-08 DIAGNOSIS — Z1231 Encounter for screening mammogram for malignant neoplasm of breast: Secondary | ICD-10-CM | POA: Diagnosis not present

## 2018-02-09 ENCOUNTER — Encounter: Payer: Self-pay | Admitting: Gastroenterology

## 2018-02-14 MED FILL — LORazepam 0.5 MG TABS: 0.5 | 5 days supply | Qty: 5 | Fill #0

## 2018-02-14 MED FILL — PANTOPRAZOLE SOD DR 40 MG T: 40 | 90 days supply | Qty: 90 | Fill #2

## 2018-02-14 MED FILL — HYDROCHLOROTHIAZIDE 25 MG T: 25 | 90 days supply | Qty: 90 | Fill #2

## 2018-02-23 ENCOUNTER — Telehealth: Payer: Self-pay | Admitting: Family Medicine

## 2018-02-23 NOTE — Telephone Encounter (Signed)
Called patient to discuss lab results. No answer. LVM asking her to call back so I can speak with her.  [name], MD

## 2018-02-25 NOTE — Telephone Encounter (Signed)
Pt returning call from Dr. [name]. Ottis Stain, CMA

## 2018-02-25 NOTE — Telephone Encounter (Signed)
Pt called back returning Dr Lennie Odor phone call.

## 2018-02-25 NOTE — Telephone Encounter (Signed)
Returned call to patient and discussed new dx of diabetes with her. She had already seen A1c on mychart. Appointment scheduled on 7/16 to discuss in more detail and do foot exam, check urine microalbumin, etc.  Patient appreciative. [name], MD

## 2018-03-08 ENCOUNTER — Encounter: Payer: Self-pay | Admitting: Family Medicine

## 2018-03-08 ENCOUNTER — Ambulatory Visit (INDEPENDENT_AMBULATORY_CARE_PROVIDER_SITE_OTHER): Payer: 59 | Admitting: Family Medicine

## 2018-03-08 VITALS — BP 122/80 | HR 74 | Temp 98.6°F | Wt 276.0 lb

## 2018-03-08 DIAGNOSIS — E119 Type 2 diabetes mellitus without complications: Secondary | ICD-10-CM

## 2018-03-08 NOTE — Progress Notes (Signed)
Date of Visit: 03/08/2018   HPI:  Maitri presents for follow up to discuss her new dx of diabetes after recent A1c was found to be 6.7.  She is coping well with this dx. Has begun working on low carb diet and has lost some weight. Denies chest pain or shortness of breath.  ROS: See HPI.  [place]: history of OSA, GERD, hypertension, arthritis, depression, gout  PHYSICAL EXAM: BP 122/80 (BP Location: Left Arm, Patient Position: Sitting, Cuff Size: Normal)   Pulse 74   Temp 98.6 F (37 C) (Oral)   Wt 276 lb (125.2 kg)   LMP 02/09/2016 (Exact Date)   SpO2 97%   BMI 44.55 kg/m  Gen: no acute distress, pleasant, cooperative HEENT: normocephalic, atraumatic, moist mucous membranes  Heart: regular rate and rhythm Lungs: clear to auscultation bilaterally  Neuro: alert, grossly nonfocal Ext: No appreciable lower extremity edema bilaterally Diabetic foot exam: 2+ DP pulses bilat, normal monofilament testing bilaterally. No lesions or significant calluses.   ASSESSMENT/PLAN:  Health maintenance:  -urine microalbumin ordered today -foot exam done today, normal -advised to schedule eye exam in light of new diabetes dx -has upcoming appointment for colonoscopy -offered pneumovax23 to patient today but patient wants to contemplate this first  Type 2 diabetes mellitus ([place]) New diagnosis recently with A1c 6.7. Patient is motivated to get A1c back down to prediabetes range. Handout given on diabetic diet. Follow up with me late Nov or early Dec to recheck A1c and follow up on progress.  Cardiac: discussed recommendation for statin with patient, she would prefer to hold off at this time.  Renal: check urine microalbumin today Eye: counseled on need to schedule eye exam Foot: normal foot exam today Immunizations: offered pneumovax to patient today but she wants to think about it first.   FOLLOW UP: Follow up in 4 months for diabetes   [place] J. [name], [place]

## 2018-03-08 NOTE — Patient Instructions (Signed)
Diabetes Mellitus and Nutrition When you have diabetes (diabetes mellitus), it is very important to have healthy eating habits because your blood sugar (glucose) levels are greatly affected by what you eat and drink. Eating healthy foods in the appropriate amounts, at about the same times every day, can help you:  Control your blood glucose.  Lower your risk of heart disease.  Improve your blood pressure.  Reach or maintain a healthy weight.  Every person with diabetes is different, and each person has different needs for a meal plan. Your health care provider may recommend that you work with a diet and nutrition specialist (dietitian) to make a meal plan that is best for you. Your meal plan may vary depending on factors such as:  The calories you need.  The medicines you take.  Your weight.  Your blood glucose, blood pressure, and cholesterol levels.  Your activity level.  Other health conditions you have, such as heart or kidney disease.  How do carbohydrates affect me? Carbohydrates affect your blood glucose level more than any other type of food. Eating carbohydrates naturally increases the amount of glucose in your blood. Carbohydrate counting is a method for keeping track of how many carbohydrates you eat. Counting carbohydrates is important to keep your blood glucose at a healthy level, especially if you use insulin or take certain oral diabetes medicines. It is important to know how many carbohydrates you can safely have in each meal. This is different for every person. Your dietitian can help you calculate how many carbohydrates you should have at each meal and for snack. Foods that contain carbohydrates include:  Bread, cereal, rice, pasta, and crackers.  Potatoes and corn.  Peas, beans, and lentils.  Milk and yogurt.  Fruit and juice.  Desserts, such as cakes, cookies, ice cream, and candy.  How does alcohol affect me? Alcohol can cause a sudden decrease in blood  glucose (hypoglycemia), especially if you use insulin or take certain oral diabetes medicines. Hypoglycemia can be a life-threatening condition. Symptoms of hypoglycemia (sleepiness, dizziness, and confusion) are similar to symptoms of having too much alcohol. If your health care provider says that alcohol is safe for you, follow these guidelines:  Limit alcohol intake to no more than 1 drink per day for nonpregnant women and 2 drinks per day for men. One drink equals 12 oz of beer, 5 oz of wine, or 1 oz of hard liquor.  Do not drink on an empty stomach.  Keep yourself hydrated with water, diet soda, or unsweetened iced tea.  Keep in mind that regular soda, juice, and other mixers may contain a lot of sugar and must be counted as carbohydrates.  What are tips for following this plan? Reading food labels  Start by checking the serving size on the label. The amount of calories, carbohydrates, fats, and other nutrients listed on the label are based on one serving of the food. Many foods contain more than one serving per package.  Check the total grams (g) of carbohydrates in one serving. You can calculate the number of servings of carbohydrates in one serving by dividing the total carbohydrates by 15. For example, if a food has 30 g of total carbohydrates, it would be equal to 2 servings of carbohydrates.  Check the number of grams (g) of saturated and trans fats in one serving. Choose foods that have low or no amount of these fats.  Check the number of milligrams (mg) of sodium in one serving. Most people   should limit total sodium intake to less than 2,300 mg per day.  Always check the nutrition information of foods labeled as "low-fat" or "nonfat". These foods may be higher in added sugar or refined carbohydrates and should be avoided.  Talk to your dietitian to identify your daily goals for nutrients listed on the label. Shopping  Avoid buying canned, premade, or processed foods. These  foods tend to be high in fat, sodium, and added sugar.  Shop around the outside edge of the grocery store. This includes fresh fruits and vegetables, bulk grains, fresh meats, and fresh dairy. Cooking  Use low-heat cooking methods, such as baking, instead of high-heat cooking methods like deep frying.  Cook using healthy oils, such as olive, canola, or sunflower oil.  Avoid cooking with butter, cream, or high-fat meats. Meal planning  Eat meals and snacks regularly, preferably at the same times every day. Avoid going long periods of time without eating.  Eat foods high in fiber, such as fresh fruits, vegetables, beans, and whole grains. Talk to your dietitian about how many servings of carbohydrates you can eat at each meal.  Eat 4-6 ounces of lean protein each day, such as lean meat, chicken, fish, eggs, or tofu. 1 ounce is equal to 1 ounce of meat, chicken, or fish, 1 egg, or 1/4 cup of tofu.  Eat some foods each day that contain healthy fats, such as avocado, nuts, seeds, and fish. Lifestyle   Check your blood glucose regularly.  Exercise at least 30 minutes 5 or more days each week, or as told by your health care provider.  Take medicines as told by your health care provider.  Do not use any products that contain nicotine or tobacco, such as cigarettes and e-cigarettes. If you need help quitting, ask your health care provider.  Work with a counselor or diabetes educator to identify strategies to manage stress and any emotional and social challenges. What are some questions to ask my health care provider?  Do I need to meet with a diabetes educator?  Do I need to meet with a dietitian?  What number can I call if I have questions?  When are the best times to check my blood glucose? Where to find more information:  American Diabetes Association: diabetes.org/food-and-fitness/food  Academy of Nutrition and Dietetics:  www.eatright.org/resources/health/diseases-and-conditions/diabetes  National Institute of Diabetes and Digestive and Kidney Diseases (NIH): www.niddk.nih.gov/health-information/diabetes/overview/diet-eating-physical-activity Summary  A healthy meal plan will help you control your blood glucose and maintain a healthy lifestyle.  Working with a diet and nutrition specialist (dietitian) can help you make a meal plan that is best for you.  Keep in mind that carbohydrates and alcohol have immediate effects on your blood glucose levels. It is important to count carbohydrates and to use alcohol carefully. This information is not intended to replace advice given to you by your health care provider. Make sure you discuss any questions you have with your health care provider. Document Released: 05/07/2005 Document Revised: 09/14/2016 Document Reviewed: 09/14/2016 Elsevier Interactive Patient Education  2018 Elsevier Inc.  

## 2018-03-09 ENCOUNTER — Encounter: Payer: Self-pay | Admitting: Family Medicine

## 2018-03-09 DIAGNOSIS — E119 Type 2 diabetes mellitus without complications: Secondary | ICD-10-CM | POA: Insufficient documentation

## 2018-03-09 LAB — MICROALBUMIN / CREATININE URINE RATIO
Creatinine, Urine: 284.2 mg/dL
MICROALB/CREAT RATIO: 6.6 mg/g{creat} (ref 0.0–30.0)
MICROALBUM., U, RANDOM: 18.7 ug/mL

## 2018-03-09 NOTE — Assessment & Plan Note (Signed)
New diagnosis recently with A1c 6.7. Patient is motivated to get A1c back down to prediabetes range. Handout given on diabetic diet. Follow up with me late Nov or early Dec to recheck A1c and follow up on progress.  Cardiac: discussed recommendation for statin with patient, she would prefer to hold off at this time.  Renal: check urine microalbumin today Eye: counseled on need to schedule eye exam Foot: normal foot exam today Immunizations: offered pneumovax to patient today but she wants to think about it first.

## 2018-03-29 ENCOUNTER — Encounter: Payer: 59 | Admitting: Gastroenterology

## 2018-04-26 ENCOUNTER — Ambulatory Visit: Payer: 59

## 2018-04-26 MED FILL — CHLORHEXIDINE 0.12% RINSE: 0.12 | 15 days supply | Qty: 473 | Fill #0

## 2018-04-27 ENCOUNTER — Ambulatory Visit (INDEPENDENT_AMBULATORY_CARE_PROVIDER_SITE_OTHER): Payer: 59 | Admitting: Family Medicine

## 2018-04-27 ENCOUNTER — Encounter: Payer: Self-pay | Admitting: Family Medicine

## 2018-04-27 VITALS — BP 122/85 | HR 90 | Temp 98.3°F | Wt 274.6 lb

## 2018-04-27 DIAGNOSIS — H6990 Unspecified Eustachian tube disorder, unspecified ear: Secondary | ICD-10-CM | POA: Insufficient documentation

## 2018-04-27 DIAGNOSIS — E119 Type 2 diabetes mellitus without complications: Secondary | ICD-10-CM | POA: Diagnosis not present

## 2018-04-27 DIAGNOSIS — H6991 Unspecified Eustachian tube disorder, right ear: Secondary | ICD-10-CM | POA: Diagnosis not present

## 2018-04-27 LAB — POCT GLYCOSYLATED HEMOGLOBIN (HGB A1C): HBA1C, POC (CONTROLLED DIABETIC RANGE): 6.4 % (ref 0.0–7.0)

## 2018-04-27 NOTE — Assessment & Plan Note (Signed)
Most likely due to allergies vs viral infection.  If does not clear may need imaging to rule out obstruction.   No signs of Ramsay Hunt or otitis media

## 2018-04-27 NOTE — Patient Instructions (Signed)
Good to see you today!  Thanks for coming in.  Afrin nasal decongestant twice a day - two squirts each nostril for 3-4 days only  Flonase use as usual   If any fever or not better in 1-2 weeks or worsening pain then call us

## 2018-04-27 NOTE — Progress Notes (Signed)
Subjective  Barbara Reese is a 52 y.o. female is presenting with the following  EAR PAIN  Location: R ear Ear pain started: 2 weeks ago after had hair done Pain is: mild mostly with a full feeling only in R ear Medications tried: none Recent ear trauma: no Prior ear surgeries: no Antibiotics in the last 30 days: no History of diabetes: prediabetes  Symptoms Ear discharge: no Fever: no Pain with chewing: no Ringing in ears: no Dizziness: no Hearing loss: yes in R ear - muffled  Rashes or blisters around ear: no Weight loss: no   Review of Symptoms - see HPI PMH - Smoking status noted.     Chief Complaint noted Review of Symptoms - see HPI PMH - Smoking status noted.    Objective Vital Signs reviewed BP 122/85   Pulse 90   Temp 98.3 F (36.8 C)   Wt 274 lb 9.6 oz (124.6 kg)   LMP 02/09/2016 (Exact Date)   SpO2 100%   BMI 44.32 kg/m   Ears:  External ear exam shows no significant lesions or deformities.  Otoscopic examination reveals clear canals, R tympanic membrane is slightly red retracted and dull Left TM is normal  Hearing is grossly normal bilaterall Lungs:  Normal respiratory effort, chest expands symmetrically. Lungs are clear to auscultation, no crackles or wheezes. Skin:  Intact without suspicious lesions or rashes Throat: normal mucosa, no exudate, uvula midline, no redness   Assessments/Plans  See after visit summary for details of patient instuctions  Eustachian tube disorder Most likely due to allergies vs viral infection.  If does not clear may need imaging to rule out obstruction.   No signs of Ramsay Hunt or otitis media

## 2018-05-02 ENCOUNTER — Telehealth: Payer: Self-pay | Admitting: Family Medicine

## 2018-05-02 MED ORDER — AMOXICILLIN 500 MG PO CAPS: 500.0000 mg | ORAL_CAPSULE | Freq: Three times a day (TID) | ORAL | 0 refills | Status: AC

## 2018-05-02 MED FILL — AMOXICILLIN 500 MG CAPSULE: 500 | 5 days supply | Qty: 15 | Fill #0

## 2018-05-02 NOTE — Telephone Encounter (Signed)
Pt informed. Deseree Blount, CMA  

## 2018-05-02 NOTE — Telephone Encounter (Signed)
Please let her know:  I sent in antibiotics Amoxicillin to take for 5 days.  If not better by two weeks she needs to come in for an appointment to make sure is not something other than infection  Thanks  LC

## 2018-05-02 NOTE — Telephone Encounter (Signed)
Pt is calling because she was seen on 04/29/18 for ear pain. She is still having the ear pain, fullness, pressure and it is not any better. Can we call in something for this to [place] since she at work. jw

## 2018-05-05 ENCOUNTER — Telehealth: Payer: Self-pay

## 2018-05-05 NOTE — Telephone Encounter (Signed)
Please let her know there is not anything that would be safe to prescribe  She could try a small amount of hydrocortisone OTC cream and rub it inside the ear canal with her pinky.  If not better or worsening she should be seen  Thanks  LC

## 2018-05-05 NOTE — Telephone Encounter (Signed)
Pt was seen by Dr. Erin Hearing on 04/27/18 and txd for ear infection. Pt states the pain in her ear is some better but having extreme itching and feeling of "fullness". Question if anything can be prescribed to help. [place] Call back [phone] Wallace Cullens, RN

## 2018-05-06 NOTE — Telephone Encounter (Signed)
Pt calls to check status. Daisja Kessinger, Salome Spotted, CMA

## 2018-05-06 NOTE — Telephone Encounter (Signed)
Spoke with her  Still full in ear - no pain or fever or discharge  Ear is itchy  Recommend Afrin for 3 more days Flonase regularly Eustachian tube opening maneuvers Hydrocortiosone cream on Q tip gently inside ear  If not better in 4-5 days needs to be seen again   She agrees

## 2018-05-06 NOTE — Telephone Encounter (Signed)
Pt states that she tried hydrocortisone cream and sweet oil.  She call the pharmacy and they  recommended that she ask for a steroid ear drops to help with itching.  They also told her that normally pts gets a 10 day course of abx.  Will forward back to MD.  Eilan Mcinerny, Salome Spotted, [place]

## 2018-05-06 NOTE — Telephone Encounter (Signed)
Pt calls back.  Spoke with Dr. Mingo Amber he states the following:  What pt was dx with will not have any effect from steroid drops.  She should try lamisil in ear if hydrocortisone cream did not help.  Relayed message to pt. She is upset and declines appt on Monday or urgent care this weekend. Barbara Reese, Salome Spotted, CMA

## 2018-05-10 NOTE — Telephone Encounter (Signed)
Pt called nurse line stating she is no better despite [place] recommendations. Pt scheduled for 9/20.

## 2018-05-11 MED FILL — PANTOPRAZOLE SOD DR 40 MG T: 40 | 90 days supply | Qty: 90 | Fill #3

## 2018-05-11 MED FILL — HYDROCHLOROTHIAZIDE 25 MG T: 25 | 90 days supply | Qty: 90 | Fill #3

## 2018-05-12 ENCOUNTER — Other Ambulatory Visit: Payer: Self-pay

## 2018-05-12 ENCOUNTER — Ambulatory Visit (INDEPENDENT_AMBULATORY_CARE_PROVIDER_SITE_OTHER): Payer: 59 | Admitting: Family Medicine

## 2018-05-12 VITALS — BP 122/84 | HR 79 | Temp 98.5°F | Wt 276.0 lb

## 2018-05-12 DIAGNOSIS — H6691 Otitis media, unspecified, right ear: Secondary | ICD-10-CM

## 2018-05-12 DIAGNOSIS — H60391 Other infective otitis externa, right ear: Secondary | ICD-10-CM

## 2018-05-12 MED ORDER — AMOXICILLIN-POT CLAVULANATE 875-125 MG PO TABS: 1.0000 | ORAL_TABLET | Freq: Two times a day (BID) | ORAL | 0 refills | Status: DC

## 2018-05-12 MED ORDER — FLUTICASONE PROPIONATE 50 MCG/ACT NA SUSP: 2.0000 | Freq: Every day | NASAL | 6 refills | Status: DC

## 2018-05-12 MED ORDER — NEOMYCIN-POLYMYXIN-HC 3.5-10000-1 OT SOLN: 4.0000 [drp] | Freq: Four times a day (QID) | OTIC | 0 refills | Status: AC

## 2018-05-12 MED FILL — AMOX-CLAV 875-125 MG TABLET: 875-125 | 10 days supply | Qty: 20 | Fill #0

## 2018-05-12 MED FILL — FLUTICASONE PROP 50 MCG SPR: 50 | 30 days supply | Qty: 16 | Fill #0

## 2018-05-12 MED FILL — NEO/POLYMYXIN/HC EAR SOLN: 3.5-10000-1 | 13 days supply | Qty: 10 | Fill #0

## 2018-05-12 NOTE — Assessment & Plan Note (Signed)
Will prescribe Cortisporin drops, 4 drops 4 times daily for about 10 days.  Patient advised to return in a week and a half if symptoms have not improved or resolved.  Also encouraged to use Flonase and given new prescription for this medicine, since that should help with her coexisting allergies.

## 2018-05-12 NOTE — Progress Notes (Signed)
   Subjective:    Barbara Reese - 52 y.o. female MRN [id number]  Date of birth: September 14, 1965  HPI  Barbara Reese is here for R ear pain and fullness. -Has been using Afrin, Flonase, and hydrocortisone per Dr. Margaretha Sheffield recommendations without any improvement -sister, who lives with her, also has developed fullness, ear drainage, and pain - does have seasonal allergies which occur during this time of year - symptoms include sneezing, runny nose, and sinus drainage - also has itchy throat on right side - ear symptoms have had a duration of about three weeks, started a few days after getting hair shampooed - more itching, less pain now - tried her sister's ear drops (unsure what kind), which did seem to help  Health Maintenance:  Health Maintenance Due  Topic Date Due  . PNEUMOCOCCAL POLYSACCHARIDE VACCINE AGE 81-64 HIGH RISK  11/18/1967  . OPHTHALMOLOGY EXAM  11/18/1975  . COLONOSCOPY  11/18/2015  . INFLUENZA VACCINE  03/24/2018    -  reports that she has never smoked. She has never used smokeless tobacco. - Review of Systems: Per HPI. - Past Medical History: Patient Active Problem List   Diagnosis Date Noted  . Acute bacterial middle ear infection, right 05/12/2018  . Infective otitis externa of right ear 05/12/2018  . Eustachian tube disorder 04/27/2018  . Type 2 diabetes mellitus ([place]) 03/09/2018  . Retrocalcaneal bursitis (back of heel), left 07/01/2017  . Degenerative arthritis of left knee 03/13/2016  . Lumbar radiculopathy 03/13/2016  . 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 Trigger thumb of right hand 01/02/2016  . Situational anxiety 10/08/2014  . Rectal bleeding 09/18/2014  . Gout of big toe 05/23/2014  . Derang of medial meniscus due to old tear/inj, unsp knee 05/17/2014  . Arthritis of knee, degenerative 10/26/2013  . Hypertension 12/24/2012  . Physical examination of employee 12/01/2012  . Arthritis 12/01/2012  . Elevated LDL cholesterol level  12/16/2011  . Prediabetes 12/16/2011  . Depression 11/14/2011  . 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 ANXIETY DISORDER 11/06/2009  . OBESITY, MODERATE 01/04/2009  . OBSTRUCTIVE SLEEP APNEA 12/28/2008  . 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  . 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 GERD 02/01/2008   - Medications: reviewed and updated   Objective:   Physical Exam BP 122/84 (BP Location: Left Arm)   Pulse 79   Temp 98.5 F (36.9 C) (Oral)   Wt 276 lb (125.2 kg)   LMP 02/09/2016 (Exact Date)   BMI 44.55 kg/m  Gen: NAD, alert, cooperative with exam, appears uncomfortable HEENT: L tympanic membrane clear with normal cone of light; R auditory canal erythematous, R tympanic membrane dull with pus visualized  CV: RRR, good S1/S2, no murmur Resp: CTABL, no wheezes, non-labored  Assessment & Plan:   Acute bacterial middle ear infection, right Since this has failed amoxicillin therapy, we will treat with Augmentin 875-125 mg twice daily x10 days.  Infective otitis externa of right ear Will prescribe Cortisporin drops, 4 drops 4 times daily for about 10 days.  Patient advised to return in a week and a half if symptoms have not improved or resolved.  Also encouraged to use Flonase and given new prescription for this medicine, since that should help with her coexisting allergies.    Maia Breslow, M.D. 05/12/2018, 10:03 AM PGY-2, [place]

## 2018-05-12 NOTE — Assessment & Plan Note (Signed)
Since this has failed amoxicillin therapy, we will treat with Augmentin 875-125 mg twice daily x10 days.

## 2018-05-12 NOTE — Patient Instructions (Signed)
It was nice meeting you today Barbara Reese!  You have a middle and outer ear infection.  For your middle ear infection, please take Augmentin twice daily for 10 days.  For your outer ear infection, use the ear drops I've prescribed four times daily.  You will put four drops in your ear at a time and lie down with your right ear up for a minute afterwards.  You can continue this for 10-12 days.  If you have any questions or concerns, please feel free to call the clinic.   Be well,  Dr. Shan Levans

## 2018-06-10 ENCOUNTER — Ambulatory Visit: Payer: 59 | Admitting: Family Medicine

## 2018-06-16 ENCOUNTER — Other Ambulatory Visit: Payer: Self-pay | Admitting: Family Medicine

## 2018-06-17 ENCOUNTER — Ambulatory Visit (INDEPENDENT_AMBULATORY_CARE_PROVIDER_SITE_OTHER): Payer: 59 | Admitting: Family Medicine

## 2018-06-17 ENCOUNTER — Encounter: Payer: Self-pay | Admitting: Family Medicine

## 2018-06-17 ENCOUNTER — Other Ambulatory Visit: Payer: Self-pay

## 2018-06-17 VITALS — BP 118/82 | HR 91 | Temp 98.1°F | Wt 276.0 lb

## 2018-06-17 DIAGNOSIS — H9201 Otalgia, right ear: Secondary | ICD-10-CM | POA: Diagnosis not present

## 2018-06-17 DIAGNOSIS — H663X1 Other chronic suppurative otitis media, right ear: Secondary | ICD-10-CM | POA: Diagnosis not present

## 2018-06-17 MED ORDER — CLOBETASOL PROPIONATE 0.05 % EX OINT: TOPICAL_OINTMENT | Freq: Every day | CUTANEOUS | 0 refills | Status: DC | PRN

## 2018-06-17 MED ORDER — OFLOXACIN 0.3 % OT SOLN: 5.0000 [drp] | Freq: Two times a day (BID) | OTIC | 0 refills | Status: AC

## 2018-06-17 MED FILL — OFLOXACIN 0.3% EAR DROPS: 0.3 | 10 days supply | Qty: 5 | Fill #0

## 2018-06-17 NOTE — Progress Notes (Signed)
Subjective:     Patient ID: Barbara Reese, female   DOB: Jan 04, 1966, 52 y.o.   MRN: [id number]  Otalgia   There is pain in the right ear. This is a chronic problem. The current episode started more than 1 month ago. The problem occurs constantly. The problem has been waxing and waning. There has been no fever. The pain is at a severity of 3/10 (Max of 10/10 when it was bad). Pertinent negatives include no coughing, ear discharge, headaches, hearing loss, rhinorrhea, sore throat or vomiting. Associated symptoms comments: Ear fuzziness but no loss of hearing. Treatments tried: Ear drops. Alcohol, Olive oil. Prescribed ear drops made it worse. The treatment provided no relief. There is no history of hearing loss.   Current Outpatient Medications on File Prior to Visit  Medication Sig Dispense Refill  . cetirizine (ZYRTEC) 10 MG tablet Take 10 mg by mouth daily as needed for allergies.    . fluticasone (FLONASE) 50 MCG/ACT nasal spray Place 1 spray into both nostrils daily. (Patient taking differently: Place 2 sprays into both nostrils daily as needed for allergies. ) 16 g 1  . hydrochlorothiazide (HYDRODIURIL) 25 MG tablet Take 1 tablet (25 mg total) by mouth daily. 90 tablet 3  . Multiple Vitamin (MULTIVITAMIN WITH MINERALS) TABS tablet Take 1 tablet by mouth daily.    . pantoprazole (PROTONIX) 40 MG tablet Take 1 tablet (40 mg total) by mouth daily. (Patient taking differently: Take 40 mg by mouth every morning. ) 90 tablet 3  . clobetasol ointment (TEMOVATE) 0.05 % APPLY TOPICALLY DAILY AS NEEDED. (Patient not taking: Reported on 06/17/2018) 45 g 0  . cyclobenzaprine (FLEXERIL) 10 MG tablet Take 10 mg by mouth 3 (three) times daily as needed for muscle spasms.    Marland Kitchen ibuprofen (ADVIL,MOTRIN) 200 MG tablet Take 600 mg by mouth 2 (two) times daily as needed for headache or moderate pain.    Marland Kitchen LORazepam (ATIVAN) 0.5 MG tablet Take 1 tablet 30 mins prior to plane ride.  May take additional tablet if  needed. (Patient not taking: Reported on 06/17/2018) 5 tablet 0  . traMADol (ULTRAM) 50 MG tablet TAKE 1 TABLET BY MOUTH EVERY 8 HOURS AS NEEDED (PAIN). (Patient not taking: Reported on 06/17/2018) 30 tablet 0   No current facility-administered medications on file prior to visit.    Past Medical History:  Diagnosis Date  . Achilles tendon disorder, left    PT HAS ON BOOT  . Allergic rhinitis   . Anemia    H/O  . Anxiety   . Depressive disorder    Hx  . 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 GERD (gastroesophageal reflux disease)   . Hepatitis    age 31- dx'd with chronic hepatitis- no problems as an adult  . Hypertension   . IBS (irritable bowel syndrome)    diet controlled, no meds  . Insomnia   . 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 OSA (obstructive sleep apnea)    Does not use CPAP.  Marland Kitchen Osteoarthritis    knees  . PONV (postoperative nausea and vomiting)   . SVD (spontaneous vaginal delivery)    x 1     Review of Systems  HENT: Positive for ear pain. Negative for ear discharge, hearing loss, rhinorrhea and sore throat.   Respiratory: Negative for cough.   Gastrointestinal: Negative for vomiting.  Neurological: Negative for headaches.  All other  systems reviewed and are negative.      Objective:   Physical Exam  Constitutional: She appears well-developed. No distress.  HENT:  Right Ear: Hearing normal. There is drainage. No swelling or tenderness. Tympanic membrane is not injected, not perforated, not erythematous and not bulging. A middle ear effusion is present. No hemotympanum. No decreased hearing is noted.  Left Ear: Hearing, tympanic membrane, external ear and ear canal normal. No drainage, swelling or tenderness. Tympanic membrane is not injected, not perforated, not erythematous and not bulging.  No middle ear effusion. No hemotympanum. No decreased hearing is  noted.  Ears:  Cardiovascular: Normal rate, regular rhythm and normal heart sounds.  No murmur heard. Pulmonary/Chest: Effort normal and breath sounds normal. No stridor. No respiratory distress. She has no wheezes.  Nursing note and vitals reviewed.      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 May use Tylenol as needed for pain. ENT referral done. Aural toilet discussed. Return precaution discussed.  She requested refill of her topical steroid which I gave.

## 2018-06-17 NOTE — Patient Instructions (Signed)
It was nice seeing you today. I am sorry about your ear pain. You may have what is called chronic suppurative otitis media. We will try topical A/B and ear toilet. I will also refer you to ENT for further evaluation. Please call soon if worsening.  Aural toilet is performed until the ear is consistently dry and free of debris [6]. Techniques include:  Dry mopping (the otorrhea is absorbed by a wisp of cotton wool that is wrapped around a probe used for ear wax removal and inserted into the ear canal under direct visualization with a head lamp, otoscope, or microscope)

## 2018-07-01 ENCOUNTER — Emergency Department (HOSPITAL_COMMUNITY): Payer: 59

## 2018-07-01 ENCOUNTER — Encounter (HOSPITAL_COMMUNITY): Payer: Self-pay | Admitting: Emergency Medicine

## 2018-07-01 ENCOUNTER — Emergency Department (HOSPITAL_COMMUNITY)
Admission: EM | Admit: 2018-07-01 | Discharge: 2018-07-01 | Disposition: A | Discharge status: Home / Self Care | Payer: 59 | Attending: Emergency Medicine | Admitting: Emergency Medicine

## 2018-07-01 ENCOUNTER — Other Ambulatory Visit: Payer: Self-pay

## 2018-07-01 DIAGNOSIS — R110 Nausea: Secondary | ICD-10-CM | POA: Diagnosis not present

## 2018-07-01 DIAGNOSIS — R42 Dizziness and giddiness: Secondary | ICD-10-CM | POA: Diagnosis not present

## 2018-07-01 DIAGNOSIS — I10 Essential (primary) hypertension: Secondary | ICD-10-CM | POA: Diagnosis not present

## 2018-07-01 DIAGNOSIS — R7303 Prediabetes: Secondary | ICD-10-CM | POA: Diagnosis not present

## 2018-07-01 DIAGNOSIS — H9201 Otalgia, right ear: Secondary | ICD-10-CM | POA: Diagnosis not present

## 2018-07-01 DIAGNOSIS — R000 Tachycardia, unspecified: Secondary | ICD-10-CM | POA: Diagnosis not present

## 2018-07-01 LAB — CBC WITH DIFFERENTIAL/PLATELET
Abs Immature Granulocytes: 0.02 10*3/uL (ref 0.00–0.07)
Basophils Absolute: 0 10*3/uL (ref 0.0–0.1)
Basophils Relative: 1 %
EOS ABS: 0.2 10*3/uL (ref 0.0–0.5)
EOS PCT: 3 %
HEMATOCRIT: 42.1 % (ref 36.0–46.0)
Hemoglobin: 13.4 g/dL (ref 12.0–15.0)
Immature Granulocytes: 0 %
LYMPHS ABS: 1.5 10*3/uL (ref 0.7–4.0)
Lymphocytes Relative: 26 %
MCH: 27.2 pg (ref 26.0–34.0)
MCHC: 31.8 g/dL (ref 30.0–36.0)
MCV: 85.6 fL (ref 80.0–100.0)
MONO ABS: 0.5 10*3/uL (ref 0.1–1.0)
Monocytes Relative: 8 %
Neutro Abs: 3.6 10*3/uL (ref 1.7–7.7)
Neutrophils Relative %: 62 %
Platelets: 340 10*3/uL (ref 150–400)
RBC: 4.92 MIL/uL (ref 3.87–5.11)
RDW: 13.9 % (ref 11.5–15.5)
WBC: 5.8 10*3/uL (ref 4.0–10.5)
nRBC: 0 % (ref 0.0–0.2)

## 2018-07-01 LAB — COMPREHENSIVE METABOLIC PANEL
ALK PHOS: 50 U/L (ref 38–126)
ALT: 16 U/L (ref 0–44)
ANION GAP: 8 (ref 5–15)
AST: 19 U/L (ref 15–41)
Albumin: 4.1 g/dL (ref 3.5–5.0)
BILIRUBIN TOTAL: 1 mg/dL (ref 0.3–1.2)
BUN: 7 mg/dL (ref 6–20)
CALCIUM: 9.4 mg/dL (ref 8.9–10.3)
CO2: 28 mmol/L (ref 22–32)
Chloride: 100 mmol/L (ref 98–111)
Creatinine, Ser: 0.86 mg/dL (ref 0.44–1.00)
Glucose, Bld: 166 mg/dL — ABNORMAL HIGH (ref 70–99)
Potassium: 3.3 mmol/L — ABNORMAL LOW (ref 3.5–5.1)
Sodium: 136 mmol/L (ref 135–145)
TOTAL PROTEIN: 8.3 g/dL — AB (ref 6.5–8.1)

## 2018-07-01 LAB — URINALYSIS, ROUTINE W REFLEX MICROSCOPIC
BILIRUBIN URINE: NEGATIVE
GLUCOSE, UA: NEGATIVE mg/dL
HGB URINE DIPSTICK: NEGATIVE
KETONES UR: NEGATIVE mg/dL
Leukocytes, UA: NEGATIVE
NITRITE: NEGATIVE
Protein, ur: NEGATIVE mg/dL
Specific Gravity, Urine: 1.004 — ABNORMAL LOW (ref 1.005–1.030)
pH: 6 (ref 5.0–8.0)

## 2018-07-01 LAB — POC URINE PREG, ED: Preg Test, Ur: NEGATIVE

## 2018-07-01 MED ORDER — METHYLPREDNISOLONE SODIUM SUCC 125 MG IJ SOLR
125.0000 mg | Freq: Once | INTRAMUSCULAR | Status: AC
  Administered 2018-07-01: 125 mg via INTRAVENOUS
  Filled 2018-07-01: qty 2

## 2018-07-01 MED ORDER — LORAZEPAM 2 MG/ML IJ SOLN
1.0000 mg | Freq: Once | INTRAMUSCULAR | Status: AC
  Administered 2018-07-01: 1 mg via INTRAVENOUS
  Filled 2018-07-01: qty 1

## 2018-07-01 MED ORDER — MECLIZINE HCL 25 MG PO TABS
25.0000 mg | ORAL_TABLET | Freq: Once | ORAL | Status: AC
  Administered 2018-07-01: 25 mg via ORAL
  Filled 2018-07-01: qty 1

## 2018-07-01 MED ORDER — PREDNISONE 20 MG PO TABS: ORAL_TABLET | ORAL | 0 refills | Status: DC

## 2018-07-01 MED ORDER — MECLIZINE HCL 25 MG PO TABS: 25.0000 mg | ORAL_TABLET | Freq: Three times a day (TID) | ORAL | 0 refills | Status: DC | PRN

## 2018-07-01 MED ORDER — LORAZEPAM 1 MG PO TABS: 1.0000 mg | ORAL_TABLET | Freq: Three times a day (TID) | ORAL | 0 refills | Status: DC | PRN

## 2018-07-01 MED ORDER — LACTATED RINGERS IV BOLUS
1000.0000 mL | Freq: Once | INTRAVENOUS | Status: AC
  Administered 2018-07-01: 1000 mL via INTRAVENOUS

## 2018-07-01 MED ORDER — ALUM & MAG HYDROXIDE-SIMETH 200-200-20 MG/5ML PO SUSP
30.0000 mL | Freq: Once | ORAL | Status: AC
  Administered 2018-07-01: 30 mL via ORAL
  Filled 2018-07-01: qty 30

## 2018-07-01 MED FILL — LORazepam 1 MG TABS: 1 | 5 days supply | Qty: 15 | Fill #0

## 2018-07-01 MED FILL — MECLIZINE 25 MG TABLET: 25 | 10 days supply | Qty: 30 | Fill #0

## 2018-07-01 MED FILL — CLOBETASOL PROPIONATE 0.05: 0.05 | 30 days supply | Qty: 45 | Fill #0

## 2018-07-01 MED FILL — predniSONE 20 MG TABS: 20 | 15 days supply | Qty: 24 | Fill #0

## 2018-07-01 NOTE — ED Notes (Signed)
Pt ambulated in hall. Pt had strong and steady gait, Pt stated that she felt dizzy but better overall than when she first came in.

## 2018-07-01 NOTE — ED Triage Notes (Signed)
Woke up dizzy this am and nauseated has haad ongoing rt ear issues has been on several antibiotics for middle ear infections since august

## 2018-07-01 NOTE — Discharge Instructions (Addendum)
I am not sure if your symptoms are more related to vestibular neuritis or stone in the wrong spot.  However we are treating you for both.  We will use medications to help with your symptoms as well you still need to continue follow-up with your doctors in your nose and throat doctor as scheduled.  Do not take the two medications within an hour of each other. Try your best to figure out which one works better and use it if possible. Epley maneuvers as many times a day as you can. Continue to follow up with ENT as scheduled.

## 2018-07-01 NOTE — ED Provider Notes (Signed)
Emergency Department Provider Note   I have reviewed the triage vital signs and the nursing notes.   HISTORY  Chief Complaint Dizziness and Nausea   HPI Barbara Reese is a 52 y.o. female who presents with an acute onset of vertigo.  Patient states that she been having some right ear issues since August.  She is been told that she had ear infections twice but did not respond to antibiotics as per sit progressively worsening pressure behind that ear she is scheduled to see her nose and throat on the 21st but this morning she woke up with significant vertigo.  She describes it as significant room spinning with nausea and spitting up.  No headache or vision changes.  No paresthesias, weakness or numbness anywhere.  No rashes.  No urinary or GI symptoms.  She never had vertigo before.  She also states she is never had a ear infection in the past until this recent episode. No other associated or modifying symptoms.    Past Medical History:  Diagnosis Date  . Achilles tendon disorder, left    PT HAS ON BOOT  . Allergic rhinitis   . Anemia    H/O  . Anxiety   . Depressive disorder    Hx  . 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 GERD (gastroesophageal reflux disease)   . Hepatitis    age 32- dx'd with chronic hepatitis- no problems as an adult  . Hypertension   . IBS (irritable bowel syndrome)    diet controlled, no meds  . Insomnia   . 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 OSA (obstructive sleep apnea)    Does not use CPAP.  Marland Kitchen Osteoarthritis    knees  . PONV (postoperative nausea and vomiting)   . SVD (spontaneous vaginal delivery)    x 1    Patient Active Problem List   Diagnosis Date Noted  . Acute bacterial middle ear infection, right 05/12/2018  . Infective otitis externa of right ear 05/12/2018  . Eustachian tube disorder 04/27/2018  . Type 2 diabetes mellitus ([place])  03/09/2018  . Retrocalcaneal bursitis (back of heel), left 07/01/2017  . Degenerative arthritis of left knee 03/13/2016  . Lumbar radiculopathy 03/13/2016  . 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 Trigger thumb of right hand 01/02/2016  . Situational anxiety 10/08/2014  . Rectal bleeding 09/18/2014  . Gout of big toe 05/23/2014  . Derang of medial meniscus due to old tear/inj, unsp knee 05/17/2014  . Arthritis of knee, degenerative 10/26/2013  . Hypertension 12/24/2012  . Physical examination of employee 12/01/2012  . Arthritis 12/01/2012  . Elevated LDL cholesterol level 12/16/2011  . Prediabetes 12/16/2011  . Depression 11/14/2011  . 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 ANXIETY DISORDER 11/06/2009  . OBESITY, MODERATE 01/04/2009  . OBSTRUCTIVE SLEEP APNEA 12/28/2008  . 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  . 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 GERD 02/01/2008    Past Surgical History:  Procedure Laterality Date  . CHOLECYSTECTOMY  1987  . CYSTOSCOPY  02/18/2016   Procedure: CYSTOSCOPY;  Surgeon: Lavonia Drafts, MD;  Location: [facility];  Service: Gynecology;;  . HYSTEROSCOPY W/D&C N/A 12/30/2015   Procedure: DILATATION AND CURETTAGE [place];  Surgeon: Mora Bellman, MD;  Location: [facility];  Service: Gynecology;  Laterality: N/A;  . KNEE SURGERY  2004   left knee- Dr. Percell Miller  . MICROLARYNGOSCOPY N/A 11/29/2017   Procedure: MICROLARYNGOSCOPY;  Surgeon:  Margaretha Sheffield, MD;  Location: [facility];  Service: ENT;  Laterality: N/A;  . ROBOTIC ASSISTED TOTAL HYSTERECTOMY WITH SALPINGECTOMY N/A 02/18/2016   Procedure: ROBOTIC ASSISTED TOTAL HYSTERECTOMY WITH SALPINGECTOMY;  Surgeon: Lavonia Drafts, MD;  Location: [facility];  Service: Gynecology;  Laterality: N/A;  . UPPER GI ENDOSCOPY     gerd    Current Outpatient Rx  . Order #: [id number] Class: Historical Med  . Order #: [id number] Class: Normal  . Order #: [id number] Class: Historical Med  . Order #: [id number] Class:  Normal  . Order #: [id number] Class: Normal  . Order #: [id number] Class: Historical Med  . Order #: [id number] Class: Print  . Order #: [id number] Class: Print  . Order #: [id number] Class: Historical Med  . Order #: [id number] Class: Normal  . Order #: [id number] Class: Print    Allergies Gabapentin  Family History  Problem Relation Age of Onset  . Osteoarthritis Mother   . Hypertension Mother   . Allergies Mother   . Rheum arthritis Mother   . Diabetes Father   . Hypertension Sister   . Heart attack Brother   . Heart attack Sister   . Allergies Son   . Allergies Sister   . Asthma Son   . Clotting disorder Sister   . Breast cancer Paternal Grandmother 36    Social History Social History   Tobacco Use  . Smoking status: Never Smoker  . Smokeless tobacco: Never Used  Substance Use Topics  . Alcohol use: Yes    Alcohol/week: 2.0 standard drinks    Types: 2 Glasses of wine per week    Comment: wine OCC  . Drug use: No    Review of Systems  All other systems negative except as documented in the HPI. All pertinent positives and negatives as reviewed in the HPI. ____________________________________________   PHYSICAL EXAM:  VITAL SIGNS: ED Triage Vitals  Enc Vitals Group     BP      Pulse      Resp      Temp      Temp src      SpO2      Weight      Height      Head Circumference      Peak Flow      Pain Score      Pain Loc      Pain Edu?      Excl. in [place]?     Constitutional: Alert and oriented. Well appearing and in no acute distress. Eyes: Conjunctivae are normal. PERRL. EOMI. Ears: R TM with an abnormal appearance of skin colored area covering it or directly behind TM with some appearance of possible vascularity.  Head: Atraumatic. Nose: No congestion/rhinnorhea. Mouth/Throat: Mucous membranes are moist.  Oropharynx non-erythematous. Neck: No stridor.  No meningeal signs.   Cardiovascular: Normal rate, regular rhythm. Good peripheral circulation. Grossly  normal heart sounds.   Respiratory: Normal respiratory effort.  No retractions. Lungs CTAB. Gastrointestinal: Soft and nontender. No distention.  Musculoskeletal: No lower extremity tenderness nor edema. No gross deformities of extremities. Neurologic:  Normal speech and language. No gross focal neurologic deficits are appreciated. CN's intact, slightly diminished hearing in right. No nystagmus but worsened symptoms when looking straight and to the left.  Skin:  Skin is warm, dry and intact. No rash noted.   ____________________________________________   LABS (all labs ordered are listed, but only abnormal results are displayed)  Labs Reviewed  COMPREHENSIVE METABOLIC PANEL - Abnormal; Notable for the following components:  Result Value   Potassium 3.3 (*)    Glucose, Bld 166 (*)    Total Protein 8.3 (*)    All other components within normal limits  URINALYSIS, ROUTINE W REFLEX MICROSCOPIC - Abnormal; Notable for the following components:   Color, Urine STRAW (*)    Specific Gravity, Urine 1.004 (*)    All other components within normal limits  CBC WITH DIFFERENTIAL/PLATELET  POC URINE PREG, ED   ____________________________________________  EKG   EKG Interpretation  Date/Time:  Friday July 01 2018 08:45:32 EST Ventricular Rate:  85 PR Interval:    QRS Duration: 106 QT Interval:  405 QTC Calculation: 482 R Axis:   27 Text Interpretation:  Sinus rhythm Borderline T wave abnormalities No significant change since last tracing Confirmed by Merrily Pew [phone]) on 07/02/2018 7:14:24 AM       ____________________________________________  RADIOLOGY  Ct Temporal Bones Wo Contrast  Result Date: 07/01/2018 CLINICAL DATA:  Right ear pain EXAM: CT TEMPORAL BONES WITHOUT CONTRAST TECHNIQUE: Axial and coronal plane CT imaging of the petrous temporal bones was performed with thin-collimation image reconstruction. No intravenous contrast was administered. Multiplanar CT  image reconstructions were also generated. COMPARISON:  Head CT 09/09/2008 FINDINGS: Right temporal bone: Mastoid air cells are entirely clear. No fluid in the middle ear. The attic is clear. Ossicular chain appears normal. External auditory canal is normal. Inner ear structures appear normal. Left temporal bone: Mastoid air cells are entirely clear. No fluid in the middle ear. The attic is clear. Ossicular chain appears normal. External auditory canal is normal. Inner ear structures are normal. Regional soft tissues of the face and upper neck appear normal. Visualized intracranial contents appear normal. No significant paranasal sinus disease. No significant dental finding. Temporomandibular joints appear normal. IMPRESSION: No abnormality seen to explain right ear pain.  Normal exam. Electronically Signed   By: Nelson Chimes M.D.   On: 07/01/2018 10:09    ____________________________________________   PROCEDURES  Procedure(s) performed:   Procedures   ____________________________________________   INITIAL IMPRESSION / ASSESSMENT AND PLAN / ED COURSE  Concern for possible right middle ear abnormality secondary to her symptoms and appearance on otoscopy.  Discussed with radiology and the initial test will be a CT temporal bone without contrast to evaluate for any bony destructive lesions and then MRI possibly after that. If that is absolutely normal will need MRI but she already has follow-up and it can be done at that time. Low suspicion for central cause of vertigo at this time.   At this point workup unremarkable. likely needs MRI for further evaluation but symptoms almost completely resolved with medications. Also considering vestibular neuritis so will dc on prednisone taper along with PRN meds for symptoms and instructions on epley maneuvers to try to cover for most possible causes.      Pertinent labs & imaging results that were available during my care of the patient were reviewed by  me and considered in my medical decision making (see chart for details).  ____________________________________________  FINAL CLINICAL IMPRESSION(S) / ED DIAGNOSES  Final diagnoses:  Vertigo     MEDICATIONS GIVEN DURING THIS VISIT:  Medications  lactated ringers bolus 1,000 mL (0 mLs Intravenous Stopped 07/01/18 1024)  meclizine (ANTIVERT) tablet 25 mg (25 mg Oral Given 07/01/18 0853)  LORazepam (ATIVAN) injection 1 mg (1 mg Intravenous Given 07/01/18 1212)  methylPREDNISolone sodium succinate (SOLU-MEDROL) 125 mg/2 mL injection 125 mg (125 mg Intravenous Given 07/01/18 1212)  alum & mag hydroxide-simeth (  MAALOX/MYLANTA) 200-200-20 MG/5ML suspension 30 mL (30 mLs Oral Given 07/01/18 1246)     NEW OUTPATIENT MEDICATIONS STARTED DURING THIS VISIT:  Discharge Medication List as of 07/01/2018  1:09 PM    START taking these medications   Details  meclizine (ANTIVERT) 25 MG tablet Take 1 tablet (25 mg total) by mouth 3 (three) times daily as needed for dizziness., Starting Fri 07/01/2018, Print    predniSONE (DELTASONE) 20 MG tablet 3 tabs po daily x 3 days, then 2 tabs x 3 days, then 1.5 tabs x 3 days, then 1 tab x 3 days, then 0.5 tabs x 3 days, Print        Note:  This note was prepared with assistance of Dragon voice recognition software. Occasional wrong-word or sound-a-like substitutions may have occurred due to the inherent limitations of voice recognition software.   Merrily Pew, MD 07/02/18 [phone]

## 2018-07-08 ENCOUNTER — Other Ambulatory Visit: Payer: Self-pay | Admitting: Family Medicine

## 2018-07-22 MED FILL — PANTOPRAZOLE SOD DR 40 MG T: 40 | 90 days supply | Qty: 90 | Fill #0

## 2018-07-22 MED FILL — HYDROCHLOROTHIAZIDE 25 MG T: 25 | 90 days supply | Qty: 90 | Fill #0

## 2018-08-12 ENCOUNTER — Ambulatory Visit (INDEPENDENT_AMBULATORY_CARE_PROVIDER_SITE_OTHER): Payer: 59 | Admitting: Family Medicine

## 2018-08-12 ENCOUNTER — Other Ambulatory Visit: Payer: Self-pay

## 2018-08-12 ENCOUNTER — Encounter: Payer: Self-pay | Admitting: Family Medicine

## 2018-08-12 VITALS — BP 126/80 | HR 86 | Temp 98.7°F | Ht 66.0 in | Wt 282.2 lb

## 2018-08-12 DIAGNOSIS — M170 Bilateral primary osteoarthritis of knee: Secondary | ICD-10-CM | POA: Diagnosis not present

## 2018-08-12 DIAGNOSIS — K219 Gastro-esophageal reflux disease without esophagitis: Secondary | ICD-10-CM

## 2018-08-12 DIAGNOSIS — I10 Essential (primary) hypertension: Secondary | ICD-10-CM

## 2018-08-12 DIAGNOSIS — E119 Type 2 diabetes mellitus without complications: Secondary | ICD-10-CM | POA: Diagnosis not present

## 2018-08-12 DIAGNOSIS — Z23 Encounter for immunization: Secondary | ICD-10-CM | POA: Diagnosis not present

## 2018-08-12 DIAGNOSIS — Z6841 Body Mass Index (BMI) 40.0 and over, adult: Secondary | ICD-10-CM | POA: Diagnosis not present

## 2018-08-12 LAB — POCT GLYCOSYLATED HEMOGLOBIN (HGB A1C): HbA1c, POC (controlled diabetic range): 7.1 % — AB (ref 0.0–7.0)

## 2018-08-12 MED ORDER — METFORMIN HCL 500 MG PO TABS: 500.0000 mg | ORAL_TABLET | Freq: Two times a day (BID) | ORAL | 3 refills | Status: DC

## 2018-08-12 NOTE — Patient Instructions (Signed)
Call the [facility] at [phone] to schedule an appointment  Start metformin 500mg  - one a day for a week, then go up to twice a day  Follow up with me in 3 months, sooner if needed  Work on the eye exam & colonoscopy  I'll call you when I get your FMLA paperwork  Be well, Dr. [name]

## 2018-08-12 NOTE — Progress Notes (Signed)
Date of Visit: 08/12/2018   HPI:  Barbara Reese presents for routine follow up.  Diabetes - not on any diabetes medications right now. Agreeable to PCV23 today. Denies chest pain or shortness of breath.  Hypertension - taking HCTZ 25mg  daily, tolerating well.  GERD - taking protonix 40mg  daily, symptoms well controlled with this medication.  FMLA - needs new FMLA forms completed so she can intermittently be absent from work with her knee pain. Also needs new 6 month handicap placard  Obesity - struggling to lose weight. Disheartened by her increased A1c today. Ready to make changes to help with diabetes and musculoskeletal issues  ROS: See HPI.  [place]: history of obesity, diabetes, hypertension, GERD, knee OA  PHYSICAL EXAM: BP 126/80   Pulse 86   Temp 98.7 F (37.1 C) (Oral)   Ht 5\' 6"  (1.676 m)   Wt 282 lb 3.2 oz (128 kg)   LMP 02/09/2016 (Exact Date)   SpO2 99%   BMI 45.55 kg/m  Gen: no acute distress, pleasant, cooperative HEENT: normocephalic, atraumatic, moist mucous membranes  Heart: regular rate and rhythm, no murmur Lungs: clear to auscultation bilaterally, normal work of breathing  Neuro: alert, grossly nonfocal, speech normal Ext: No appreciable lower extremity edema bilaterally  Abdomen: soft nontender to palpation   ASSESSMENT/PLAN:  Health maintenance:  -pneumovax 23 given today -advised to schedule colonoscopy -advised to schedule eye exam  OBESITY, MODERATE Discussed how weight loss will help both diabetes and her knee OA/other musculoskeletal issues. Patient motivated to make changes. Gave phone # for [facility], as patient would benefit from regular follow up with weight loss specialist  GERD Well controlled. Continue current regimen.   Hypertension Well controlled. Continue current regimen.   Arthritis of knee, degenerative Needs new FMLA paperwork completed. She will have it faxed to me and I will call her when I get it.  Encouraged to follow up with Dr. Tamala Julian for knees and ankle pain. Handicap form completed today for 6 months, discussed wanting her to stay active.  Type 2 diabetes mellitus ([facility]) A1c elevated today to 7.1. start metformin 500mg  daily x1 week, then go up to 500mg  twice daily. Pneumovax 23 today. Advised to get her eye exam.  FOLLOW UP: Follow up in 3 months for diabetes   [place] J. [name], [place]

## 2018-08-13 NOTE — Assessment & Plan Note (Signed)
Needs new FMLA paperwork completed. She will have it faxed to me and I will call her when I get it. Encouraged to follow up with Dr. Tamala Julian for knees and ankle pain. Handicap form completed today for 6 months, discussed wanting her to stay active.

## 2018-08-13 NOTE — Assessment & Plan Note (Signed)
A1c elevated today to 7.1. start metformin 500mg  daily x1 week, then go up to 500mg  twice daily. Pneumovax 23 today. Advised to get her eye exam.

## 2018-08-13 NOTE — Assessment & Plan Note (Signed)
Well-controlled.  Continue current regimen. 

## 2018-08-13 NOTE — Assessment & Plan Note (Signed)
Discussed how weight loss will help both diabetes and her knee OA/other musculoskeletal issues. Patient motivated to make changes. Gave phone # for [facility], as patient would benefit from regular follow up with weight loss specialist

## 2018-08-22 ENCOUNTER — Telehealth: Payer: Self-pay | Admitting: Family Medicine

## 2018-08-22 MED FILL — HYDROCHLOROTHIAZIDE 25 MG T: 25 | 90 days supply | Qty: 90 | Fill #0

## 2018-08-22 MED FILL — PANTOPRAZOLE SOD DR 40 MG T: 40 | 90 days supply | Qty: 90 | Fill #0

## 2018-08-22 NOTE — Telephone Encounter (Signed)
Received FMLA forms to complete for patient. Called patient & reviewed her needs - misses work 1-2 times per month for a day at a time when knee OA flares. Forms completed reflecting this.  Will return to [facility] RN team. Please scan copy into chart & fax back to [employer] (# provided on forms) Patient appreciative  [name], MD

## 2018-08-25 NOTE — Telephone Encounter (Signed)
Forms faxed to [employer] and placed in batch scanning.  Danley Danker, RN [facility] RN)

## 2018-08-31 ENCOUNTER — Telehealth: Payer: Self-pay | Admitting: *Deleted

## 2018-08-31 MED FILL — metFORMIN HCL 500 MG TABS: 500 | 90 days supply | Qty: 180 | Fill #0

## 2018-08-31 NOTE — Telephone Encounter (Signed)
Pt called and lm on nurse line.  She is upset that she was recently dx with DM and was not given "any education or a glucometer"  She is requesting that Dr. [name] call her back. [place], Salome Spotted, CMA

## 2018-08-31 NOTE — Telephone Encounter (Signed)
Called patient & advised that with just taking metformin she does not need to check her blood sugar. The question had arisen when a family member asked her what her sugars were running. Offered to rx meter if she wants one, but she is fine with not checking sugars.  She called the [facility] and is on the waitlist to start there hopefully in February Has lost 8 lbs, is watching what she eats. Has not yet started metformin but plans to do this soon.  Patient appreciative of the call.  [name], MD

## 2018-09-05 ENCOUNTER — Telehealth: Payer: Self-pay | Admitting: Family Medicine

## 2018-09-05 MED ORDER — ONDANSETRON HCL 4 MG PO TABS: 4.0000 mg | ORAL_TABLET | Freq: Three times a day (TID) | ORAL | 0 refills | Status: DC | PRN

## 2018-09-05 NOTE — Telephone Encounter (Signed)
Called patient. Cannot tolerate metformin. Will stop. rx zofran for nausea. She will do diet & exercise & follow up in 3 months to see how A1c is doing. Patient appreciative.  [name], MD

## 2018-09-05 NOTE — Telephone Encounter (Signed)
Called patient back to see which medication was giving her problems.  Patient states that it is metformin.  She started taking it on Wednesday through Sunday. Patient is experiencing nausea, belching, gripping of stomach and diarrhea.  Patient called pharmacy and they suggested an extended and stopped taking it on Sunday.  Patient wants to know if she can just exercise and modify her diet? Says reactions started the first time she took it and she has GERD so it was not a pleasant experience at all.  Patient wants to speak to Dr. [name] and can be reached at [phone].  Marland KitchenOzella Almond, CMA

## 2018-09-05 NOTE — Telephone Encounter (Signed)
Pt called about her medication. She started to take the medication last week and she starting having a problem with the medication. She wants to know if Dr. [name] can change her medication. Please call her back. ad

## 2018-09-06 DIAGNOSIS — B369 Superficial mycosis, unspecified: Secondary | ICD-10-CM | POA: Diagnosis not present

## 2018-09-06 DIAGNOSIS — Z7289 Other problems related to lifestyle: Secondary | ICD-10-CM | POA: Diagnosis not present

## 2018-09-06 DIAGNOSIS — H6241 Otitis externa in other diseases classified elsewhere, right ear: Secondary | ICD-10-CM | POA: Diagnosis not present

## 2018-10-31 ENCOUNTER — Telehealth: Payer: 59 | Admitting: Family

## 2018-10-31 DIAGNOSIS — J208 Acute bronchitis due to other specified organisms: Secondary | ICD-10-CM | POA: Diagnosis not present

## 2018-10-31 MED ORDER — BENZONATATE 100 MG PO CAPS: 100.0000 mg | ORAL_CAPSULE | Freq: Three times a day (TID) | ORAL | 0 refills | Status: DC | PRN

## 2018-10-31 MED FILL — FLUTICASONE PROP 50 MCG SPR: 50 | 30 days supply | Qty: 16 | Fill #1 | Status: TO

## 2018-10-31 NOTE — Progress Notes (Signed)
We are sorry that you are not feeling well.  Here is how we plan to help!  Based on your presentation I believe you most likely have A cough due to a virus.  This is called viral bronchitis and is best treated by rest, plenty of fluids and control of the cough.  You may use Ibuprofen or Tylenol as directed to help your symptoms.     In addition you may use A non-prescription cough medication called Robitussin DAC. Take 2 teaspoons every 8 hours or Delsym: take 2 teaspoons every 12 hours., A non-prescription cough medication called Mucinex DM: take 2 tablets every 12 hours. and A prescription cough medication called Tessalon Perles 100mg . You may take 1-2 capsules every 8 hours as needed for your cough.  Approximately 5 minutes spent reviewing and documenting in patient's chart.   From your responses in the eVisit questionnaire you describe inflammation in the upper respiratory tract which is causing a significant cough.  This is commonly called Bronchitis and has four common causes:    Allergies  Viral Infections  Acid Reflux  Bacterial Infection Allergies, viruses and acid reflux are treated by controlling symptoms or eliminating the cause. An example might be a cough caused by taking certain blood pressure medications. You stop the cough by changing the medication. Another example might be a cough caused by acid reflux. Controlling the reflux helps control the cough.  USE OF BRONCHODILATOR ("RESCUE") INHALERS: There is a risk from using your bronchodilator too frequently.  The risk is that over-reliance on a medication which only relaxes the muscles surrounding the breathing tubes can reduce the effectiveness of medications prescribed to reduce swelling and congestion of the tubes themselves.  Although you feel brief relief from the bronchodilator inhaler, your asthma may actually be worsening with the tubes becoming more swollen and filled with mucus.  This can delay other crucial treatments,  such as oral steroid medications. If you need to use a bronchodilator inhaler daily, several times per day, you should discuss this with your provider.  There are probably better treatments that could be used to keep your asthma under control.     HOME CARE . Only take medications as instructed by your medical team. . Complete the entire course of an antibiotic. . Drink plenty of fluids and get plenty of rest. . Avoid close contacts especially the very young and the elderly . Cover your mouth if you cough or cough into your sleeve. . Always remember to wash your hands . A steam or ultrasonic humidifier can help congestion.   GET HELP RIGHT AWAY IF: . You develop worsening fever. . You become short of breath . You cough up blood. . Your symptoms persist after you have completed your treatment plan MAKE SURE YOU   Understand these instructions.  Will watch your condition.  Will get help right away if you are not doing well or get worse.  Your e-visit answers were reviewed by a board certified advanced clinical practitioner to complete your personal care plan.  Depending on the condition, your plan could have included both over the counter or prescription medications. If there is a problem please reply  once you have received a response from your provider. Your safety is important to [place].  If you have drug allergies check your prescription carefully.    You can use MyChart to ask questions about today's visit, request a non-urgent call back, or ask for a work or school excuse for 24 hours  related to this e-Visit. If it has been greater than 24 hours you will need to follow up with your provider, or enter a new e-Visit to address those concerns. You will get an e-mail in the next two days asking about your experience.  I hope that your e-visit has been valuable and will speed your recovery. Thank you for using e-visits.   

## 2018-11-01 ENCOUNTER — Telehealth: Payer: Self-pay

## 2018-11-01 NOTE — Telephone Encounter (Signed)
Patient called to report she did an e-visit yesterday due to cough and was prescribed Tessalon. Note mentioned prednisone but none was called in.  Stated her cough yesterday was dry but is now productive. She is using Flonase, Zyrtec, Ibuprofen and tessalon without relief.   States her main symptoms are cough and HA that is worse when she coughs.  Denies fever today, states temp was 100.2 yesterday. Traveled to [place] for [place] from 2/18 -2/26.  Call back is [phone]  Danley Danker, RN [facility] [place])

## 2018-11-02 ENCOUNTER — Other Ambulatory Visit: Payer: Self-pay

## 2018-11-02 ENCOUNTER — Ambulatory Visit (INDEPENDENT_AMBULATORY_CARE_PROVIDER_SITE_OTHER): Payer: 59 | Admitting: Family Medicine

## 2018-11-02 ENCOUNTER — Encounter: Payer: Self-pay | Admitting: Family Medicine

## 2018-11-02 DIAGNOSIS — B9789 Other viral agents as the cause of diseases classified elsewhere: Secondary | ICD-10-CM

## 2018-11-02 DIAGNOSIS — J988 Other specified respiratory disorders: Secondary | ICD-10-CM | POA: Diagnosis not present

## 2018-11-02 HISTORY — DX: Other viral agents as the cause of diseases classified elsewhere: B97.89

## 2018-11-02 NOTE — Patient Instructions (Addendum)
I confirmed with folks at infection control that you are at extremely low risk for COVID 19 despite your recent travel to [place].   Per [facility] policy, you should contact [facility] Work before you return to work. Try the trick with warm tea and honey for cough. My official diagnosis is ILI=influenza like illness.

## 2018-11-02 NOTE — Progress Notes (Signed)
Established Patient Office Visit  Subjective:  Patient ID: Barbara Reese, female    DOB: Aug 08, 1966  Age: 53 y.o. MRN: [id number]  CC: No chief complaint on file.   HPI Barbara Reese presents for cough and low grade fever.  Much of history taken by phone day of visit.  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  Patient's travel was to [place] for [employer] returning to [place] on Feb 26.   Came down with respiratory illness on 3/8.  Had evisit and was given tessalon pearles.  Symptoms include dry cough, low grade fever, felt cold.  Denies SOB.  T max is 100.2 She is improving.  Called yesterday and today to make sure no pneumonia.  Treating with tessalon, flonase and zyrtec.  Did have flu shot.  Works at [employer].  No underlying cardiovascular disease.  Immuno competent. Confirmed above during face-to-face visit.  Added that she had a mildly scratchy throat.  No recent fever.  Added that she has had some myalgias.  Past Medical History:  Diagnosis Date  . Achilles tendon disorder, left    PT HAS ON BOOT  . Allergic rhinitis   . Anemia    H/O  . Anxiety   . Depressive disorder    Hx  . 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 GERD (gastroesophageal reflux disease)   . Hepatitis    age 58- dx'd with chronic hepatitis- no problems as an adult  . Hypertension   . IBS (irritable bowel syndrome)    diet controlled, no meds  . Insomnia   . 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 OSA (obstructive sleep apnea)    Does not use CPAP.  Marland Kitchen Osteoarthritis    knees  . PONV (postoperative nausea and vomiting)   . SVD (spontaneous vaginal delivery)    x 1    Past Surgical History:  Procedure Laterality Date  . CHOLECYSTECTOMY  1987  . CYSTOSCOPY  02/18/2016   Procedure: CYSTOSCOPY;  Surgeon: Lavonia Drafts, MD;   Location: [facility];  Service: Gynecology;;  . HYSTEROSCOPY W/D&C N/A 12/30/2015   Procedure: DILATATION AND CURETTAGE Pollyann Glen;  Surgeon: Mora Bellman, MD;  Location: [facility];  Service: Gynecology;  Laterality: N/A;  . KNEE SURGERY  2004   left knee- Dr. Percell Miller  . MICROLARYNGOSCOPY N/A 11/29/2017   Procedure: MICROLARYNGOSCOPY;  Surgeon: Margaretha Sheffield, MD;  Location: [facility];  Service: ENT;  Laterality: N/A;  . ROBOTIC ASSISTED TOTAL HYSTERECTOMY WITH SALPINGECTOMY N/A 02/18/2016   Procedure: ROBOTIC ASSISTED TOTAL HYSTERECTOMY WITH SALPINGECTOMY;  Surgeon: Lavonia Drafts, MD;  Location: [facility];  Service: Gynecology;  Laterality: N/A;  . UPPER GI ENDOSCOPY     gerd    Family History  Problem Relation Age of Onset  . Osteoarthritis Mother   . Hypertension Mother   . Allergies Mother   . Rheum arthritis Mother   . Diabetes Father   . Hypertension Sister   . Heart attack Brother   . Heart attack Sister   . Allergies Son   . Allergies Sister   . Asthma Son   . Clotting disorder Sister   . Breast cancer Paternal Grandmother 55    Social History   Socioeconomic History  . Marital status: Single    Spouse name: Not on file  . Number of children: Not on file  . Years  of education: Not on file  . Highest education level: Not on file  Occupational History  . 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 Financial resource strain: Not on file  . Food insecurity:    Worry: Not on file    Inability: Not on file  . Transportation needs:    Medical: Not on file    Non-medical: Not on file  Tobacco Use  . Smoking status: Never Smoker  . Smokeless tobacco: Never Used  Substance and Sexual Activity  . Alcohol use: Yes    Alcohol/week: 2.0 standard drinks    Types: 2 Glasses of wine per week    Comment: wine OCC  . Drug use: No  . Sexual activity: Never    Birth control/protection: None  Lifestyle  . Physical activity:     Days per week: Not on file    Minutes per session: Not on file  . Stress: Not on file  Relationships  . Social connections:    Talks on phone: Not on file    Gets together: Not on file    Attends religious service: Not on file    Active member of club or organization: Not on file    Attends meetings of clubs or organizations: Not on file    Relationship status: Not on file  . Intimate partner violence:    Fear of current or ex partner: Not on file    Emotionally abused: Not on file    Physically abused: Not on file    Forced sexual activity: Not on file  Other Topics Concern  . Not on file  Social History Narrative   Pt is single and lives with 34 year old son.    Outpatient Medications Prior to Visit  Medication Sig Dispense Refill  . benzonatate (TESSALON PERLES) 100 MG capsule Take 1 capsule (100 mg total) by mouth 3 (three) times daily as needed. 20 capsule 0  . cetirizine (ZYRTEC) 10 MG tablet Take 10 mg by mouth daily as needed for allergies.    . clobetasol ointment (TEMOVATE) 0.05 % Apply topically daily as needed. 45 g 0  . cyclobenzaprine (FLEXERIL) 10 MG tablet Take 10 mg by mouth 3 (three) times daily as needed for muscle spasms.    . fluticasone (FLONASE) 50 MCG/ACT nasal spray Place 1 spray into both nostrils daily. (Patient taking differently: Place 2 sprays into both nostrils daily as needed for allergies. ) 16 g 1  . hydrochlorothiazide (HYDRODIURIL) 25 MG tablet TAKE 1 TABLET BY MOUTH ONCE DAILY 90 tablet 3  . ibuprofen (ADVIL,MOTRIN) 200 MG tablet Take 600 mg by mouth 2 (two) times daily as needed for headache or moderate pain.    . Multiple Vitamin (MULTIVITAMIN WITH MINERALS) TABS tablet Take 1 tablet by mouth daily.    . ondansetron (ZOFRAN) 4 MG tablet Take 1 tablet (4 mg total) by mouth every 8 (eight) hours as needed for nausea or vomiting. 10 tablet 0  . pantoprazole (PROTONIX) 40 MG tablet TAKE 1 TABLET BY MOUTH ONCE DAILY 90 tablet 3   No  facility-administered medications prior to visit.     Allergies  Allergen Reactions  . Gabapentin Rash    Itchy rash    ROS Review of Systems    Objective:    Physical Exam  LMP 02/09/2016 (Exact Date)  Wt Readings from Last 3 Encounters:  08/12/18 282 lb 3.2 oz (128 kg)  06/17/18 276 lb (125.2 kg)  05/12/18 276 lb (125.2 kg)   TMs are normal Throat, mild injection Neck, no sig nodes. Lungs clear.  Health Maintenance Due  Topic Date Due  . OPHTHALMOLOGY EXAM  11/18/1975  . COLONOSCOPY  11/18/2015    There are no preventive care reminders to display for this patient.  Lab Results  Component Value Date   TSH 1.465 09/24/2010   TSH 1.465 09/24/2010   Lab Results  Component Value Date   WBC 5.8 07/01/2018   HGB 13.4 07/01/2018   HCT 42.1 07/01/2018   MCV 85.6 07/01/2018   PLT 340 07/01/2018   Lab Results  Component Value Date   NA 136 07/01/2018   K 3.3 (L) 07/01/2018   CO2 28 07/01/2018   GLUCOSE 166 (H) 07/01/2018   BUN 7 07/01/2018   CREATININE 0.86 07/01/2018   BILITOT 1.0 07/01/2018   ALKPHOS 50 07/01/2018   AST 19 07/01/2018   ALT 16 07/01/2018   PROT 8.3 (H) 07/01/2018   ALBUMIN 4.1 07/01/2018   CALCIUM 9.4 07/01/2018   ANIONGAP 8 07/01/2018   Lab Results  Component Value Date   CHOL 181 02/04/2018   Lab Results  Component Value Date   HDL 60 02/04/2018   Lab Results  Component Value Date   LDLCALC 97 02/04/2018   Lab Results  Component Value Date   TRIG 121 02/04/2018   Lab Results  Component Value Date   CHOLHDL 3.0 02/04/2018   Lab Results  Component Value Date   HGBA1C 7.1 (A) 08/12/2018      Assessment & Plan:   Problem List Items Addressed This Visit    None      No orders of the defined types were placed in this encounter.   Follow-up: No follow-ups on file.    Zenia Resides, MD

## 2018-11-02 NOTE — Assessment & Plan Note (Addendum)
Public Health.  By my read and confirmed with Anjie [phone] with [facility] infection control that she is at extremely low risk for COVID 19.  No recommended testing.  Prudent to use mask in office and always good hand washing.  She will need to contact [facility] before returning per [employer].  Clinically, this is more than a URI.  Either consider a lower respiratory infection or an ILI.  Regardless, it is viral and she is improving.  She is outside the treatment window for tamiflu, so we elected to not test for flu.    Not a public health risk because her travel is low risk.  Does not qualify for COVID 19 testing.

## 2018-11-02 NOTE — Telephone Encounter (Signed)
Reports she spoke with Dr Andria Frames.  She is to see patient at 2:30 pm today.

## 2018-11-03 ENCOUNTER — Encounter: Payer: Self-pay | Admitting: Family Medicine

## 2018-11-04 ENCOUNTER — Other Ambulatory Visit: Payer: Self-pay

## 2018-11-04 ENCOUNTER — Telehealth: Payer: Self-pay | Admitting: *Deleted

## 2018-11-04 ENCOUNTER — Ambulatory Visit (HOSPITAL_COMMUNITY)
Admission: EM | Admit: 2018-11-04 | Discharge: 2018-11-04 | Disposition: A | Discharge status: Home / Self Care | Payer: 59 | Attending: Emergency Medicine | Admitting: Emergency Medicine

## 2018-11-04 ENCOUNTER — Ambulatory Visit (INDEPENDENT_AMBULATORY_CARE_PROVIDER_SITE_OTHER): Payer: 59

## 2018-11-04 ENCOUNTER — Encounter (HOSPITAL_COMMUNITY): Payer: Self-pay

## 2018-11-04 DIAGNOSIS — R05 Cough: Secondary | ICD-10-CM

## 2018-11-04 DIAGNOSIS — J0100 Acute maxillary sinusitis, unspecified: Secondary | ICD-10-CM | POA: Diagnosis not present

## 2018-11-04 MED ORDER — AMOXICILLIN-POT CLAVULANATE 875-125 MG PO TABS: 1.0000 | ORAL_TABLET | Freq: Two times a day (BID) | ORAL | 0 refills | Status: AC

## 2018-11-04 MED FILL — AMOX-CLAV 875-125 MG TABLET: 875-125 | 10 days supply | Qty: 20 | Fill #0

## 2018-11-04 NOTE — Telephone Encounter (Signed)
Noted and agree. 

## 2018-11-04 NOTE — ED Triage Notes (Signed)
Pt presents with upper respiratory symptoms; congestion, nasal drainage, head/sinus pressure and chest pain & wheezing for over a week.

## 2018-11-04 NOTE — Telephone Encounter (Signed)
Pt calls because she is not running a fever, but is coughing up mucous and some dried blood.  She is now having chest discomfort with her cough and a "rattle in her chest when she lays on her left side"  Per Dr. Andria Frames she needs rest, mucinex , water and plenty of rest.  Pt is dissatisfied with this answer and is requesting a chest xray or prednisone.  States that she will go to urgent care if that is preferred but states that something is wrong and she has been resting since Monday.   Will forward to MD.  Christen Bame, CMA

## 2018-11-04 NOTE — ED Provider Notes (Signed)
[facility]   [id number] 11/04/18 Arrival Time: 9767   CC: URI symptoms   SUBJECTIVE: History from: patient.  Barbara Reese is a 53 y.o. female who presents with abrupt onset of runny nose, sinus pain/ pressure, productive cough x 1 week.  Worsening symptoms within the past day.  Denies known to sick exposure or precipitating event.  Has tried OTC medication without relief.  Symptoms are made worse at night. Saw PCP 2 days ago and diagnosed with flu-like symptoms. Reports hx of fever with tmax of 101, and fatigue.   Denies chills, sore throat, SOB, wheezing, chest pain, nausea, changes in bowel or bladder habits.    ROS: As per HPI.  Past Medical History:  Diagnosis Date  . Achilles tendon disorder, left    PT HAS ON BOOT  . Allergic rhinitis   . Anemia    H/O  . Anxiety   . Depressive disorder    Hx  . 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 GERD (gastroesophageal reflux disease)   . Hepatitis    age 44- dx'd with chronic hepatitis- no problems as an adult  . Hypertension   . IBS (irritable bowel syndrome)    diet controlled, no meds  . Insomnia   . 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 OSA (obstructive sleep apnea)    Does not use CPAP.  Marland Kitchen Osteoarthritis    knees  . PONV (postoperative nausea and vomiting)   . SVD (spontaneous vaginal delivery)    x 1   Past Surgical History:  Procedure Laterality Date  . CHOLECYSTECTOMY  1987  . CYSTOSCOPY  02/18/2016   Procedure: CYSTOSCOPY;  Surgeon: Lavonia Drafts, MD;  Location: [facility];  Service: Gynecology;;  . HYSTEROSCOPY W/D&C N/A 12/30/2015   Procedure: DILATATION AND CURETTAGE Pollyann Glen;  Surgeon: Mora Bellman, MD;  Location: [facility];  Service: Gynecology;  Laterality: N/A;  . KNEE SURGERY  2004   left knee- Dr. Percell Miller  . MICROLARYNGOSCOPY N/A 11/29/2017   Procedure: MICROLARYNGOSCOPY;  Surgeon:  Margaretha Sheffield, MD;  Location: [facility];  Service: ENT;  Laterality: N/A;  . ROBOTIC ASSISTED TOTAL HYSTERECTOMY WITH SALPINGECTOMY N/A 02/18/2016   Procedure: ROBOTIC ASSISTED TOTAL HYSTERECTOMY WITH SALPINGECTOMY;  Surgeon: Lavonia Drafts, MD;  Location: [facility];  Service: Gynecology;  Laterality: N/A;  . UPPER GI ENDOSCOPY     gerd   Allergies  Allergen Reactions  . Gabapentin Rash    Itchy rash   No current facility-administered medications on file prior to encounter.    Current Outpatient Medications on File Prior to Encounter  Medication Sig Dispense Refill  . benzonatate (TESSALON PERLES) 100 MG capsule Take 1 capsule (100 mg total) by mouth 3 (three) times daily as needed. 20 capsule 0  . cetirizine (ZYRTEC) 10 MG tablet Take 10 mg by mouth daily as needed for allergies.    . clobetasol ointment (TEMOVATE) 0.05 % Apply topically daily as needed. 45 g 0  . cyclobenzaprine (FLEXERIL) 10 MG tablet Take 10 mg by mouth 3 (three) times daily as needed for muscle spasms.    . fluticasone (FLONASE) 50 MCG/ACT nasal spray Place 1 spray into both nostrils daily. (Patient taking differently: Place 2 sprays into both nostrils daily as needed for allergies. ) 16 g 1  . hydrochlorothiazide (HYDRODIURIL) 25 MG tablet TAKE 1 TABLET BY MOUTH ONCE DAILY 90 tablet 3  . ibuprofen (ADVIL,MOTRIN) 200 MG tablet  Take 600 mg by mouth 2 (two) times daily as needed for headache or moderate pain.    . Multiple Vitamin (MULTIVITAMIN WITH MINERALS) TABS tablet Take 1 tablet by mouth daily.    . ondansetron (ZOFRAN) 4 MG tablet Take 1 tablet (4 mg total) by mouth every 8 (eight) hours as needed for nausea or vomiting. 10 tablet 0  . pantoprazole (PROTONIX) 40 MG tablet TAKE 1 TABLET BY MOUTH ONCE DAILY 90 tablet 3   Social History   Socioeconomic History  . Marital status: Single    Spouse name: Not on file  . Number of children: Not on file  . Years of education: Not on file  . Highest education  level: Not on file  Occupational History  . 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 Financial resource strain: Not on file  . Food insecurity:    Worry: Not on file    Inability: Not on file  . Transportation needs:    Medical: Not on file    Non-medical: Not on file  Tobacco Use  . Smoking status: Never Smoker  . Smokeless tobacco: Never Used  Substance and Sexual Activity  . Alcohol use: Yes    Alcohol/week: 2.0 standard drinks    Types: 2 Glasses of wine per week    Comment: wine OCC  . Drug use: No  . Sexual activity: Never    Birth control/protection: None  Lifestyle  . Physical activity:    Days per week: Not on file    Minutes per session: Not on file  . Stress: Not on file  Relationships  . Social connections:    Talks on phone: Not on file    Gets together: Not on file    Attends religious service: Not on file    Active member of club or organization: Not on file    Attends meetings of clubs or organizations: Not on file    Relationship status: Not on file  . Intimate partner violence:    Fear of current or ex partner: Not on file    Emotionally abused: Not on file    Physically abused: Not on file    Forced sexual activity: Not on file  Other Topics Concern  . Not on file  Social History Narrative   Pt is single and lives with 22 year old son.   Family History  Problem Relation Age of Onset  . Osteoarthritis Mother   . Hypertension Mother   . Allergies Mother   . Rheum arthritis Mother   . Diabetes Father   . Hypertension Sister   . Heart attack Brother   . Heart attack Sister   . Allergies Son   . Allergies Sister   . Asthma Son   . Clotting disorder Sister   . Breast cancer Paternal Grandmother 9    OBJECTIVE:  Vitals:   11/04/18 1339  BP: (!) 147/83  Pulse: 69  Resp: 16  Temp: 98.5 F (36.9 C)  TempSrc: Oral  SpO2: 100%     General appearance: alert; appears fatigued, but  nontoxic; speaking in full sentences and tolerating own secretions HEENT: NCAT; Ears: EACs clear, TMs pearly gray; Eyes: PERRL.  EOM grossly intact. Sinuses: TTP over frontal and maxillary sinuses; Nose: nares patent with mild rhinorrhea, Throat: oropharynx clear, tonsils non erythematous or enlarged, uvula midline  Neck: supple without LAD Lungs: unlabored respirations, symmetrical air entry; cough: mild;  no respiratory distress; CTAB Heart: regular rate and rhythm.  Radial pulses 2+ symmetrical bilaterally Skin: warm and dry Psychological: alert and cooperative; normal mood and affect  DIAGNOSTIC STUDIES:  Dg Chest 2 View  Result Date: 11/04/2018 CLINICAL DATA:  Cough EXAM: CHEST - 2 VIEW COMPARISON:  12/17/2007 FINDINGS: The heart size and mediastinal contours are within normal limits. Both lungs are clear. The visualized skeletal structures are unremarkable. IMPRESSION: No active cardiopulmonary disease. Electronically Signed   By: Ulyses Jarred M.D.   On: 11/04/2018 14:33     ASSESSMENT & PLAN:  1. Acute non-recurrent maxillary sinusitis     Meds ordered this encounter  Medications  . amoxicillin-clavulanate (AUGMENTIN) 875-125 MG tablet    Sig: Take 1 tablet by mouth every 12 (twelve) hours for 10 days.    Dispense:  20 tablet    Refill:  0    Order Specific Question:   Supervising Provider    Answer:   Raylene Everts [[id number]]   Chest x-ray did not show cardiopulmonary disease  We will treat you for potential sinus infection Get plenty of rest and push fluids You may use OTC zyrtec and/ of flonase as needed for congestion and runny nose Augmentin prescribed.  Take as directed and to completion.   Use OTC medications like ibuprofen or tylenol as needed fever or pain Follow up with PCP if symptoms persist Return or go to ER if you have any new or worsening symptoms fever, chills, nausea, vomiting, chest pain, cough, shortness of breath, wheezing, abdominal pain, changes  in bowel or bladder habits, etc...  Reviewed expectations re: course of current medical issues. Questions answered. Outlined signs and symptoms indicating need for more acute intervention. Patient verbalized understanding. After Visit Summary given.         Lestine Box, PA-C 11/04/18 [phone]

## 2018-11-04 NOTE — Discharge Instructions (Signed)
Chest x-ray did not show cardiopulmonary disease  We will treat you for potential sinus infection Get plenty of rest and push fluids You may use OTC zyrtec and/ of flonase as needed for congestion and runny nose Augmentin prescribed.  Take as directed and to completion.   Use OTC medications like ibuprofen or tylenol as needed fever or pain Follow up with PCP if symptoms persist Return or go to ER if you have any new or worsening symptoms fever, chills, nausea, vomiting, chest pain, cough, shortness of breath, wheezing, abdominal pain, changes in bowel or bladder habits, etc..Marland Kitchen

## 2018-11-14 ENCOUNTER — Other Ambulatory Visit: Payer: Self-pay | Admitting: Family Medicine

## 2018-11-14 MED FILL — HYDROCHLOROTHIAZIDE 25 MG T: 25 | 90 days supply | Qty: 90 | Fill #0 | Status: TO

## 2018-11-14 MED FILL — PANTOPRAZOLE SOD DR 40 MG T: 40 | 90 days supply | Qty: 90 | Fill #0 | Status: TO

## 2018-11-15 MED FILL — CLOBETASOL PROPIONATE 0.05: 0.05 | 30 days supply | Qty: 45 | Fill #0

## 2018-11-30 MED FILL — FLUTICASONE PROP 50 MCG SPR: 50 | 30 days supply | Qty: 16 | Fill #0

## 2018-12-14 ENCOUNTER — Other Ambulatory Visit: Payer: Self-pay | Admitting: Family Medicine

## 2018-12-14 MED FILL — traMADol HCL 50 MG TABS: 50 | 10 days supply | Qty: 30 | Fill #0

## 2019-01-18 ENCOUNTER — Ambulatory Visit: Payer: 59 | Admitting: Family Medicine

## 2019-01-24 ENCOUNTER — Ambulatory Visit: Payer: 59 | Admitting: Family Medicine

## 2019-01-30 MED FILL — HYDROCHLOROTHIAZIDE 25 MG T: 25 | 90 days supply | Qty: 90 | Fill #0

## 2019-01-30 MED FILL — PANTOPRAZOLE SOD DR 40 MG T: 40 | 90 days supply | Qty: 90 | Fill #0

## 2019-01-31 ENCOUNTER — Other Ambulatory Visit: Payer: Self-pay

## 2019-01-31 ENCOUNTER — Ambulatory Visit (INDEPENDENT_AMBULATORY_CARE_PROVIDER_SITE_OTHER): Payer: 59 | Admitting: Family Medicine

## 2019-01-31 VITALS — BP 126/80 | HR 84 | Wt 268.8 lb

## 2019-01-31 DIAGNOSIS — E119 Type 2 diabetes mellitus without complications: Secondary | ICD-10-CM

## 2019-01-31 DIAGNOSIS — M170 Bilateral primary osteoarthritis of knee: Secondary | ICD-10-CM

## 2019-01-31 DIAGNOSIS — I10 Essential (primary) hypertension: Secondary | ICD-10-CM | POA: Diagnosis not present

## 2019-01-31 LAB — POCT GLYCOSYLATED HEMOGLOBIN (HGB A1C): HbA1c, POC (controlled diabetic range): 6.6 % (ref 0.0–7.0)

## 2019-01-31 NOTE — Progress Notes (Signed)
Date of Visit: 01/31/2019   HPI:   Diabetes: currently taking no medications for diabetes. Has been watching her diet closely and has lost some weight. Past due for eye exam  Knee pain: needs FMLA renewed for her knee pain. Asking for same work allowances as in the past. Has plans to see Dr. Tamala Julian soon for a knee injection. Also asks for renewal of handicap placard. Takes tramadol with some relief of pain. Wants to avoid knee replacement if possible.  Abdominal pain - last week was having burning upper R abdominal pain, burping, and diarrhea. No blood in stool, no fevers. Took benefiber and kaopectate with improvement. Seeing GI in a few weeks for colonoscopy consultation & to follow up on this pain. Pain is much improved now.  Hypertension- currently taking HCTZ 25mg  daily. No chest pain or shortness of breath.  ROS: See HPI.  [place]: history of knee arthritis, obesity, gerd, OSA, depression, hypertension, type 2 diabetes   PHYSICAL EXAM: BP 126/80   Pulse 84   Wt 268 lb 12.8 oz (121.9 kg)   LMP 02/09/2016 (Exact Date)   SpO2 98%   BMI 43.39 kg/m   Gen: no acute distress, pleasant, cooperative HEENT: normocephalic, atraumatic, moist mucous membranes  Lungs: normal work of breathing  Neuro: alert, grossly nonfocal, speech normal Ext: L knee with no effusion or warmth. Mild crepitus with flexion & extension  ASSESSMENT/PLAN:  Health maintenance:  -has upcoming appointment to discuss colonoscpy with GI -reminded patient to get eyes examined  Hypertension Well controlled. Continue current regimen.   Type 2 diabetes mellitus (HCC) Well controlled off medications. Congratulated patient. Reminded her to schedule eye exam Return for fasting lipids & CMET in 1 wk  Arthritis of knee, degenerative Will complete FMLA forms Continue as needed tramadol Completed 5 year handicap placard today Encouraged continued efforts at weight loss She will follow up with Dr. Tamala Julian for  injection   Abdominal pain Benign exam Resolved pain, or much improved Has follow up in place with GI  FOLLOW UP: Follow up in 6 months for above issues  [place] J. [name], [place]

## 2019-01-31 NOTE — Patient Instructions (Signed)
It was great to see you again today!  See Dr. Tamala Julian Will fill out FMLA papers Get your eyes checked On your way out, schedule an appointment one morning to come back for fasting labs. Do not eat or drink anything other than water the morning of your lab appointment until after your labs are drawn.   Follow up with me in 6 months, sooner if needed  Be well, Dr. [name]

## 2019-02-01 NOTE — Assessment & Plan Note (Signed)
Well-controlled.  Continue current regimen. 

## 2019-02-01 NOTE — Assessment & Plan Note (Signed)
Will complete FMLA forms Continue as needed tramadol Completed 5 year handicap placard today Encouraged continued efforts at weight loss She will follow up with Dr. Tamala Julian for injection

## 2019-02-01 NOTE — Assessment & Plan Note (Signed)
Well controlled off medications. Congratulated patient. Reminded her to schedule eye exam Return for fasting lipids & CMET in 1 wk

## 2019-02-02 ENCOUNTER — Telehealth: Payer: Self-pay | Admitting: Family Medicine

## 2019-02-02 NOTE — Telephone Encounter (Signed)
FMLA paperwork completed Will return to [facility] RN team Will need to be scanned into the chart  Thanks [name], MD

## 2019-02-03 NOTE — Telephone Encounter (Signed)
Faxed to appropriate number and copy made for batch scanning.

## 2019-02-07 ENCOUNTER — Other Ambulatory Visit: Payer: 59

## 2019-02-14 ENCOUNTER — Other Ambulatory Visit: Payer: 59

## 2019-02-14 ENCOUNTER — Other Ambulatory Visit: Payer: Self-pay | Admitting: Family Medicine

## 2019-02-14 ENCOUNTER — Other Ambulatory Visit: Payer: Self-pay

## 2019-02-14 ENCOUNTER — Other Ambulatory Visit (INDEPENDENT_AMBULATORY_CARE_PROVIDER_SITE_OTHER): Payer: 59

## 2019-02-14 DIAGNOSIS — E119 Type 2 diabetes mellitus without complications: Secondary | ICD-10-CM | POA: Diagnosis not present

## 2019-02-14 DIAGNOSIS — Z1231 Encounter for screening mammogram for malignant neoplasm of breast: Secondary | ICD-10-CM

## 2019-02-15 LAB — LIPID PANEL
Chol/HDL Ratio: 3.4 ratio (ref 0.0–4.4)
Cholesterol, Total: 196 mg/dL (ref 100–199)
HDL: 58 mg/dL (ref >39)
LDL Calculated: 120 mg/dL — ABNORMAL HIGH (ref 0–99)
Triglycerides: 92 mg/dL (ref 0–149)
VLDL Cholesterol Cal: 18 mg/dL (ref 5–40)

## 2019-02-15 LAB — CMP14+EGFR
ALT: 16 IU/L (ref 0–32)
AST: 12 IU/L (ref 0–40)
Albumin/Globulin Ratio: 1.5 (ref 1.2–2.2)
Albumin: 4.5 g/dL (ref 3.8–4.9)
Alkaline Phosphatase: 53 IU/L (ref 39–117)
BUN/Creatinine Ratio: 14 (ref 9–23)
BUN: 13 mg/dL (ref 6–24)
Bilirubin Total: 0.7 mg/dL (ref 0.0–1.2)
CO2: 23 mmol/L (ref 20–29)
Calcium: 10.1 mg/dL (ref 8.7–10.2)
Chloride: 98 mmol/L (ref 96–106)
Creatinine, Ser: 0.92 mg/dL (ref 0.57–1.00)
GFR calc Af Amer: 82 mL/min/{1.73_m2} (ref >59)
GFR calc non Af Amer: 71 mL/min/{1.73_m2} (ref >59)
Globulin, Total: 3 g/dL (ref 1.5–4.5)
Glucose: 134 mg/dL — ABNORMAL HIGH (ref 65–99)
Potassium: 3.9 mmol/L (ref 3.5–5.2)
Sodium: 138 mmol/L (ref 134–144)
Total Protein: 7.5 g/dL (ref 6.0–8.5)

## 2019-02-15 MED FILL — FLUTICASONE PROP 50 MCG SPR: 50 | 30 days supply | Qty: 16 | Fill #0

## 2019-02-17 ENCOUNTER — Encounter: Payer: Self-pay | Admitting: Family Medicine

## 2019-02-17 ENCOUNTER — Telehealth: Payer: Self-pay | Admitting: Family Medicine

## 2019-02-17 NOTE — Telephone Encounter (Signed)
Called patient to discuss labs. Overall look good. Recommend statin due to diabetes, patient still does not want statin. Working hard on lifestyle management. Will revisit statin in future as appropriate.  [name], MD

## 2019-03-21 ENCOUNTER — Other Ambulatory Visit: Payer: Self-pay | Admitting: Family Medicine

## 2019-03-23 MED FILL — CLOBETASOL PROPIONATE 0.05: 0.05 | 30 days supply | Qty: 45 | Fill #0

## 2019-03-29 ENCOUNTER — Ambulatory Visit
Admission: RE | Admit: 2019-03-29 | Discharge: 2019-03-29 | Disposition: A | Discharge status: Home / Self Care | Payer: 59 | Source: Ambulatory Visit | Attending: Family Medicine | Admitting: Family Medicine

## 2019-03-29 ENCOUNTER — Other Ambulatory Visit: Payer: Self-pay

## 2019-03-29 DIAGNOSIS — Z1231 Encounter for screening mammogram for malignant neoplasm of breast: Secondary | ICD-10-CM

## 2019-04-13 DIAGNOSIS — R140 Abdominal distension (gaseous): Secondary | ICD-10-CM | POA: Diagnosis not present

## 2019-04-13 DIAGNOSIS — Z1211 Encounter for screening for malignant neoplasm of colon: Secondary | ICD-10-CM | POA: Diagnosis not present

## 2019-04-13 DIAGNOSIS — K915 Postcholecystectomy syndrome: Secondary | ICD-10-CM | POA: Diagnosis not present

## 2019-04-13 DIAGNOSIS — R1013 Epigastric pain: Secondary | ICD-10-CM | POA: Diagnosis not present

## 2019-04-13 DIAGNOSIS — K219 Gastro-esophageal reflux disease without esophagitis: Secondary | ICD-10-CM | POA: Diagnosis not present

## 2019-04-18 MED FILL — CLOBETASOL PROPIONATE 0.05: 0.05 | 30 days supply | Qty: 45 | Fill #0

## 2019-04-18 MED FILL — FLUTICASONE PROP 50 MCG SPR: 50 | 30 days supply | Qty: 16 | Fill #1

## 2019-04-18 MED FILL — HYDROCHLOROTHIAZIDE 25 MG T: 25 | 90 days supply | Qty: 90 | Fill #1

## 2019-04-18 MED FILL — PANTOPRAZOLE SOD DR 40 MG T: 40 | 90 days supply | Qty: 90 | Fill #1

## 2019-05-15 ENCOUNTER — Other Ambulatory Visit: Payer: Self-pay | Admitting: Family Medicine

## 2019-05-16 MED FILL — traMADol HCL 50 MG TABS: 50 | 10 days supply | Qty: 30 | Fill #0

## 2019-05-22 ENCOUNTER — Other Ambulatory Visit: Payer: Self-pay | Admitting: Family Medicine

## 2019-05-22 MED FILL — PEG-3350 SOLUTION: 420 | 1 days supply | Qty: 4000 | Fill #0

## 2019-05-23 DIAGNOSIS — H938X3 Other specified disorders of ear, bilateral: Secondary | ICD-10-CM | POA: Diagnosis not present

## 2019-05-23 DIAGNOSIS — H9201 Otalgia, right ear: Secondary | ICD-10-CM | POA: Diagnosis not present

## 2019-05-23 DIAGNOSIS — Z8669 Personal history of other diseases of the nervous system and sense organs: Secondary | ICD-10-CM | POA: Diagnosis not present

## 2019-05-23 DIAGNOSIS — Z7289 Other problems related to lifestyle: Secondary | ICD-10-CM | POA: Diagnosis not present

## 2019-05-25 MED FILL — traMADol HCL 50 MG TABS: 50 | 10 days supply | Qty: 30 | Fill #0

## 2019-06-01 MED FILL — PEG-3350 SOLUTION: 420 | 1 days supply | Qty: 4000 | Fill #0

## 2019-06-03 ENCOUNTER — Encounter (HOSPITAL_COMMUNITY): Payer: Self-pay | Admitting: Emergency Medicine

## 2019-06-03 ENCOUNTER — Emergency Department (HOSPITAL_COMMUNITY): Payer: 59

## 2019-06-03 ENCOUNTER — Emergency Department (HOSPITAL_COMMUNITY)
Admission: EM | Admit: 2019-06-03 | Discharge: 2019-06-03 | Disposition: A | Discharge status: Home / Self Care | Payer: 59 | Attending: Emergency Medicine | Admitting: Emergency Medicine

## 2019-06-03 ENCOUNTER — Other Ambulatory Visit: Payer: Self-pay

## 2019-06-03 DIAGNOSIS — Z79899 Other long term (current) drug therapy: Secondary | ICD-10-CM | POA: Diagnosis not present

## 2019-06-03 DIAGNOSIS — I10 Essential (primary) hypertension: Secondary | ICD-10-CM | POA: Insufficient documentation

## 2019-06-03 DIAGNOSIS — E119 Type 2 diabetes mellitus without complications: Secondary | ICD-10-CM | POA: Diagnosis not present

## 2019-06-03 DIAGNOSIS — M25561 Pain in right knee: Secondary | ICD-10-CM | POA: Diagnosis not present

## 2019-06-03 MED ORDER — KETOROLAC TROMETHAMINE 15 MG/ML IJ SOLN
15.0000 mg | Freq: Once | INTRAMUSCULAR | Status: AC
  Administered 2019-06-03: 15 mg via INTRAMUSCULAR
  Filled 2019-06-03: qty 1

## 2019-06-03 MED ORDER — NAPROXEN 500 MG PO TABS: 500.0000 mg | ORAL_TABLET | Freq: Two times a day (BID) | ORAL | 0 refills | Status: DC

## 2019-06-03 NOTE — ED Provider Notes (Signed)
[facility]ENC[facility] DEPART[facility] Provider Note   CSN: [id number] Arrival date & time: 06/03/19  1920     History   Chief Complaint Chief Complaint  Patient presents with  . Knee Pain    HPI Barbara Reese is a 53 y.o. female presenting for evaluation of right knee pain.  Patient states the past few days, she has been having persistent right knee pain.  Pain began in the back of her knee/lower leg.  Patient states it is hard to move because it feels stiff.  She has not been able to take anti-inflammatories as she is scheduled to have a colonoscopy in several days.  Patient reports a history of osteoarthritis, though states this feels different than her normal.  She has not needed to see an orthopedic doctor in a long time, no longer established with one.  Denies fall, trauma, injury.  She denies numbness or tingling.  She has fevers and chills, chest pain, shortness of breath.  No recent travel, surgeries, immobilization, history of cancer, history of previous DVT/PE, or hormone use.      HPI  Past Medical History:  Diagnosis Date  . Achilles tendon disorder, left    PT HAS ON BOOT  . Allergic rhinitis   . Anemia    H/O  . Anxiety   . Depressive disorder    Hx  . 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 GERD (gastroesophageal reflux disease)   . Hepatitis    age 34- dx'd with chronic hepatitis- no problems as an adult  . Hypertension   . IBS (irritable bowel syndrome)    diet controlled, no meds  . Insomnia   . 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 OSA (obstructive sleep apnea)    Does not use CPAP.  Marland Kitchen Osteoarthritis    knees  . PONV (postoperative nausea and vomiting)   . SVD (spontaneous vaginal delivery)    x 1    Patient Active Problem List   Diagnosis Date Noted  . Viral respiratory illness 11/02/2018  . Acute bacterial middle ear infection,  right 05/12/2018  . Infective otitis externa of right ear 05/12/2018  . Eustachian tube disorder 04/27/2018  . Type 2 diabetes mellitus ([place]) 03/09/2018  . Retrocalcaneal bursitis (back of heel), left 07/01/2017  . Degenerative arthritis of left knee 03/13/2016  . Lumbar radiculopathy 03/13/2016  . 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 Trigger thumb of right hand 01/02/2016  . Situational anxiety 10/08/2014  . Rectal bleeding 09/18/2014  . Gout of big toe 05/23/2014  . Derang of medial meniscus due to old tear/inj, unsp knee 05/17/2014  . Arthritis of knee, degenerative 10/26/2013  . Hypertension 12/24/2012  . Physical examination of employee 12/01/2012  . Arthritis 12/01/2012  . Elevated LDL cholesterol level 12/16/2011  . Prediabetes 12/16/2011  . Depression 11/14/2011  . 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 ANXIETY DISORDER 11/06/2009  . OBESITY, MODERATE 01/04/2009  . OBSTRUCTIVE SLEEP APNEA 12/28/2008  . 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  . 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 GERD 02/01/2008    Past Surgical History:  Procedure Laterality Date  . CHOLECYSTECTOMY  1987  . CYSTOSCOPY  02/18/2016   Procedure: CYSTOSCOPY;  Surgeon: Lavonia Drafts, MD;  Location: [facility];  Service: Gynecology;;  . HYSTEROSCOPY W/D&C N/A 12/30/2015   Procedure: DILATATION AND CURETTAGE Pollyann Glen;  Surgeon: Mora Bellman, MD;  Location: [facility];  Service: Gynecology;  Laterality: N/A;  . KNEE SURGERY  2004   left knee- Dr. Percell Miller  . MICROLARYNGOSCOPY N/A 11/29/2017   Procedure: MICROLARYNGOSCOPY;  Surgeon: Margaretha Sheffield, MD;  Location: [facility];  Service: ENT;  Laterality: N/A;  . ROBOTIC ASSISTED TOTAL HYSTERECTOMY WITH SALPINGECTOMY N/A 02/18/2016   Procedure: ROBOTIC ASSISTED TOTAL HYSTERECTOMY WITH SALPINGECTOMY;  Surgeon: Lavonia Drafts, MD;  Location: [facility];  Service: Gynecology;  Laterality: N/A;  . UPPER GI ENDOSCOPY     gerd     OB History   No obstetric  history on file.      Home Medications    Prior to Admission medications   Medication Sig Start Date End Date Taking? Authorizing Provider  cetirizine (ZYRTEC) 10 MG tablet Take 10 mg by mouth daily as needed for allergies.    [provider]  clobetasol cream (TEMOVATE) 0.05 % APPLY TO THE AFFECTED AREA(S) TOPICALLY DAILY AS NEEDED. 03/23/19   [name], MD  cyclobenzaprine (FLEXERIL) 10 MG tablet Take 10 mg by mouth 3 (three) times daily as needed for muscle spasms.    [provider]  fluticasone (FLONASE) 50 MCG/ACT nasal spray Place 1 spray into both nostrils daily. Patient taking differently: Place 2 sprays into both nostrils daily as needed for allergies.  02/15/14   [name], MD  hydrochlorothiazide (HYDRODIURIL) 25 MG tablet TAKE 1 TABLET BY MOUTH ONCE DAILY 07/08/18   [name], MD  ibuprofen (ADVIL,MOTRIN) 200 MG tablet Take 600 mg by mouth 2 (two) times daily as needed for headache or moderate pain.    [provider]  Multiple Vitamin (MULTIVITAMIN WITH MINERALS) TABS tablet Take 1 tablet by mouth daily.    [provider]  naproxen (NAPROSYN) 500 MG tablet Take 1 tablet (500 mg total) by mouth 2 (two) times daily with a meal. 06/03/19   Christiana Gurevich, PA-C  pantoprazole (PROTONIX) 40 MG tablet TAKE 1 TABLET BY MOUTH ONCE DAILY 07/08/18   [name], MD  traMADol (ULTRAM) 50 MG tablet TAKE 1 TABLET BY MOUTH EVERY 8 HOURS AS NEEDED FOR PAIN 05/25/19   [name], MD    Family History Family History  Problem Relation Age of Onset  . Osteoarthritis Mother   . Hypertension Mother   . Allergies Mother   . Rheum arthritis Mother   . Diabetes Father   . Hypertension Sister   . Heart attack Brother   . Heart attack Sister   . Allergies Son   . Allergies Sister   . Asthma Son   . Clotting disorder Sister   . Breast cancer Paternal Grandmother 31    Social History Social History    Tobacco Use  . Smoking status: Never Smoker  . Smokeless tobacco: Never Used  Substance Use Topics  . Alcohol use: Yes    Alcohol/week: 2.0 standard drinks    Types: 2 Glasses of wine per week    Comment: wine OCC  . Drug use: No     Allergies   Gabapentin   Review of Systems Review of Systems  Respiratory: Negative for shortness of breath.   Cardiovascular: Negative for chest pain.  Musculoskeletal: Positive for arthralgias and myalgias.     Physical Exam Updated Vital Signs BP (!) 145/95 (BP Location: Right Arm)   Pulse 74   Temp 98.1 F (36.7 C) (Oral)   Resp 14   Ht 5\' 6"  (1.676 m)   Wt 120.7 kg   LMP 02/09/2016 (Exact Date)  SpO2 100%   BMI 42.93 kg/m   Physical Exam Vitals signs and nursing note reviewed.  Constitutional:      General: She is not in acute distress.    Appearance: She is well-developed.     Comments: Appears nontoxic  HENT:     Head: Normocephalic and atraumatic.  Neck:     Musculoskeletal: Normal range of motion.  Pulmonary:     Effort: Pulmonary effort is normal.  Abdominal:     General: There is no distension.  Musculoskeletal: Normal range of motion.     Comments: Tenderness palpation of the posterior and lateral right knee.  No obvious swelling, warmth, or erythema.  Mild discomfort with palpation of the proximal calf.  No tenderness palpation of the thigh or upper leg.  No obvious bruising.  Pedal pulses intact.  No distal swelling.  Full active range of motion with pain.  Skin:    General: Skin is warm.     Capillary Refill: Capillary refill takes less than 2 seconds.     Findings: No rash.  Neurological:     Mental Status: She is alert and oriented to person, place, and time.      ED Treatments / Results  Labs (all labs ordered are listed, but only abnormal results are displayed) Labs Reviewed - No data to display  EKG None  Radiology Dg Knee Complete 4 Views Right  Result Date: 06/03/2019 CLINICAL DATA:   Right knee pain and swelling since injury on Thursday. EXAM: RIGHT KNEE - COMPLETE 4+ VIEW COMPARISON:  Right knee x-rays dated October 26, 2013. FINDINGS: No acute fracture or dislocation. No joint effusion. Unchanged mild medial and patellofemoral compartment joint space narrowing and small tricompartmental marginal osteophytes. Bone mineralization is normal. Soft tissues are unremarkable. IMPRESSION: 1.  No acute osseous abnormality. 2. Unchanged mild tricompartmental degenerative changes. Electronically Signed   By: Titus Dubin M.D.   On: 06/03/2019 20:06    Procedures Procedures (including critical care time)  Medications Ordered in ED Medications  ketorolac (TORADOL) 15 MG/ML injection 15 mg (has no administration in time range)     Initial Impression / Assessment and Plan / ED Course  I have reviewed the triage vital signs and the nursing notes.  Pertinent labs & imaging results that were available during my care of the patient were reviewed by me and considered in my medical decision making (see chart for details).        Patient presenting for evaluation of right knee pain.  Physical examination, she is intact.  No erythema, warmth, fevers or chills, doubt septic joint.  X-rays obtained in triage shows OA without fracture dislocation.  Patient without risk factors for DVT, however pain began in posterior knee/lower leg.  As such, will obtain a outpatient ultrasound as there is no DVT ultrasound available at this time in the ED.  Discussed symptomatic treatment.  Patient states she will reschedule her colonoscopy, as she feels she needs NSAIDs and does not want to try Tylenol or Voltaren gel.  Patient states she was told she cannot take tramadol preop by a pharmacist.  As patient states she would rather reschedule her colonoscopy, will give Toradol shot in the ED and have patient take naproxen for pain control.  If ultrasound is negative, encourage follow-up with orthopedics.  At this  time, patient appears safe for discharge.  Return precautions given.  Patient states she understands and agrees to plan.  Final Clinical Impressions(s) / ED Diagnoses  Final diagnoses:  Acute pain of right knee    ED Discharge Orders         Ordered    naproxen (NAPROSYN) 500 MG tablet  2 times daily with meals     06/03/19 2025    LE VENOUS     06/03/19 2025           Franchot Heidelberg, PA-C 06/03/19 2039    Sherwood Gambler, MD 06/04/19 1706

## 2019-06-03 NOTE — ED Triage Notes (Signed)
Pt c/o right knee pain and swelling on Thursday. Able to move ankle and bear weight

## 2019-06-03 NOTE — Discharge Instructions (Addendum)
Take naproxen 2 times a day with meals.  Do not take other anti-inflammatories at the same time (Advil, Motrin, ibuprofen, Aleve). You may supplement with Tylenol if you need further pain control. Use ice packs for pain and swelling.  Call your GI doctor to reschedule your colonoscopy.  Follow the instructions regarding the outpatient ultrasound. If negative, follow up with your orthopedic doctor or the one listed below for further management of your knee.  Return to the ER if you develop fevers, chest pain, difficulty breathing, or any new, worsening, or concerning symptoms.

## 2019-06-03 NOTE — ED Notes (Signed)
Discharge instructions discussed with pt. Pt verbalized understanding with no questions at this time.  

## 2019-06-04 ENCOUNTER — Ambulatory Visit (HOSPITAL_COMMUNITY): Admission: RE | Admit: 2019-06-04 | Payer: 59 | Source: Ambulatory Visit

## 2019-06-06 ENCOUNTER — Other Ambulatory Visit: Payer: Self-pay

## 2019-06-06 ENCOUNTER — Ambulatory Visit (INDEPENDENT_AMBULATORY_CARE_PROVIDER_SITE_OTHER): Payer: 59 | Admitting: Family Medicine

## 2019-06-06 ENCOUNTER — Encounter: Payer: Self-pay | Admitting: Family Medicine

## 2019-06-06 DIAGNOSIS — M170 Bilateral primary osteoarthritis of knee: Secondary | ICD-10-CM | POA: Diagnosis not present

## 2019-06-06 MED ORDER — VITAMIN D (ERGOCALCIFEROL) 1.25 MG (50000 UNIT) PO CAPS: 50000.0000 [IU] | ORAL_CAPSULE | ORAL | 0 refills | Status: DC

## 2019-06-06 MED FILL — VIT D2 1.25 MG (50,000 UNIT: 1.25 MG | 84 days supply | Qty: 12 | Fill #0

## 2019-06-06 NOTE — Assessment & Plan Note (Signed)
Right knee injected today.  Does have some worsening pain.  Discussed with patient that I do believe the patient has severe patellofemoral arthritis.  Due to the instability of the knee and the abnormal thigh to calf ratio I do believe that patient would need a custom OA brace with a Tru pull lite attachment.  Patient will have this pain.  Injection today, encouraged home exercises.  Discussed icing regimen and home exercises.  Follow-up again in 4 to 8 weeks

## 2019-06-06 NOTE — Patient Instructions (Addendum)
Good to see you Exercise 3 times a week See me again 4-6 weeks

## 2019-06-06 NOTE — Progress Notes (Signed)
[facility] [address] [place], [place] [address] Phone: [phone] Subjective:    I'm seeing this patient by the request  of:    CC: Positive or right knee pain  [id number]   09/11/2017 Patient does have hypoechoic changes.  Still there.  States that she did not make improvement even though it has been nearly 3 months since we have seen patient.  We discussed with patient about changes there is a possibility of allopurinol helping with this being possible gout.  Patient wants to try this.  We discussed diet, exercise, hydration as well.  We discussed the possible laboratory workup which patient declined.  We will see how patient responds.  X-rays ordered today as well.  06/06/2019 Barbara Reese is a 53 y.o. female coming in with complaint of right knee pain. Does not remember any injury. Swelling is proximal lateral to where her pain is. Injection this past weekend of toradol in her shoulder. Has about 16 steps in her house. Going up stairs is most painful.   Onset- 1 week  Location - medial Character- dull, stiffness  Aggravating factors- walking, stairs  Reliving factors-  Therapies tried- ice, heat, oral, topical  Severity-  8/10 right now 10/10 at its worse      Past Medical History:  Diagnosis Date  . Achilles tendon disorder, left    PT HAS ON BOOT  . Allergic rhinitis   . Anemia    H/O  . Anxiety   . Depressive disorder    Hx  . 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 GERD (gastroesophageal reflux disease)   . Hepatitis    age 43- dx'd with chronic hepatitis- no problems as an adult  . Hypertension   . IBS (irritable bowel syndrome)    diet controlled, no meds  . Insomnia   . 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 OSA (obstructive sleep apnea)    Does not use CPAP.  Marland Kitchen Osteoarthritis    knees  . PONV (postoperative nausea and  vomiting)   . SVD (spontaneous vaginal delivery)    x 1   Past Surgical History:  Procedure Laterality Date  . CHOLECYSTECTOMY  1987  . CYSTOSCOPY  02/18/2016   Procedure: CYSTOSCOPY;  Surgeon: Lavonia Drafts, MD;  Location: [facility];  Service: Gynecology;;  . HYSTEROSCOPY W/D&C N/A 12/30/2015   Procedure: DILATATION AND CURETTAGE Pollyann Glen;  Surgeon: Mora Bellman, MD;  Location: [facility];  Service: Gynecology;  Laterality: N/A;  . KNEE SURGERY  2004   left knee- Dr. Percell Miller  . MICROLARYNGOSCOPY N/A 11/29/2017   Procedure: MICROLARYNGOSCOPY;  Surgeon: Margaretha Sheffield, MD;  Location: [facility];  Service: ENT;  Laterality: N/A;  . ROBOTIC ASSISTED TOTAL HYSTERECTOMY WITH SALPINGECTOMY N/A 02/18/2016   Procedure: ROBOTIC ASSISTED TOTAL HYSTERECTOMY WITH SALPINGECTOMY;  Surgeon: Lavonia Drafts, MD;  Location: [facility];  Service: Gynecology;  Laterality: N/A;  . UPPER GI ENDOSCOPY     gerd   Social History   Socioeconomic History  . Marital status: Single    Spouse name: Not on file  . Number of children: Not on file  . Years of education: Not on file  . Highest education level: Not on file  Occupational History  . 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 Financial resource strain: Not on file  . Food  insecurity    Worry: Not on file    Inability: Not on file  . Transportation needs    Medical: Not on file    Non-medical: Not on file  Tobacco Use  . Smoking status: Never Smoker  . Smokeless tobacco: Never Used  Substance and Sexual Activity  . Alcohol use: Yes    Alcohol/week: 2.0 standard drinks    Types: 2 Glasses of wine per week    Comment: wine OCC  . Drug use: No  . Sexual activity: Never    Birth control/protection: None  Lifestyle  . Physical activity    Days per week: Not on file    Minutes per session: Not on file  . Stress: Not on file  Relationships  . Social Herbalist on phone: Not  on file    Gets together: Not on file    Attends religious service: Not on file    Active member of club or organization: Not on file    Attends meetings of clubs or organizations: Not on file    Relationship status: Not on file  Other Topics Concern  . Not on file  Social History Narrative   Pt is single and lives with 75 year old son.   Allergies  Allergen Reactions  . Gabapentin Rash    Itchy rash   Family History  Problem Relation Age of Onset  . Osteoarthritis Mother   . Hypertension Mother   . Allergies Mother   . Rheum arthritis Mother   . Diabetes Father   . Hypertension Sister   . Heart attack Brother   . Heart attack Sister   . Allergies Son   . Allergies Sister   . Asthma Son   . Clotting disorder Sister   . Breast cancer Paternal Grandmother 17     Current Outpatient Medications (Cardiovascular):  .  hydrochlorothiazide (HYDRODIURIL) 25 MG tablet, TAKE 1 TABLET BY MOUTH ONCE DAILY  Current Outpatient Medications (Respiratory):  .  cetirizine (ZYRTEC) 10 MG tablet, Take 10 mg by mouth daily as needed for allergies. .  fluticasone (FLONASE) 50 MCG/ACT nasal spray, Place 1 spray into both nostrils daily. (Patient taking differently: Place 2 sprays into both nostrils daily as needed for allergies. )  Current Outpatient Medications (Analgesics):  .  ibuprofen (ADVIL,MOTRIN) 200 MG tablet, Take 600 mg by mouth 2 (two) times daily as needed for headache or moderate pain. .  naproxen (NAPROSYN) 500 MG tablet, Take 1 tablet (500 mg total) by mouth 2 (two) times daily with a meal. .  traMADol (ULTRAM) 50 MG tablet, TAKE 1 TABLET BY MOUTH EVERY 8 HOURS AS NEEDED FOR PAIN   Current Outpatient Medications (Other):  .  clobetasol cream (TEMOVATE) 0.05 %, APPLY TO THE AFFECTED AREA(S) TOPICALLY DAILY AS NEEDED. Marland Kitchen  cyclobenzaprine (FLEXERIL) 10 MG tablet, Take 10 mg by mouth 3 (three) times daily as needed for muscle spasms. .  Multiple Vitamin (MULTIVITAMIN WITH  MINERALS) TABS tablet, Take 1 tablet by mouth daily. .  pantoprazole (PROTONIX) 40 MG tablet, TAKE 1 TABLET BY MOUTH ONCE DAILY .  Vitamin D, Ergocalciferol, (DRISDOL) 1.25 MG (50000 UT) CAPS capsule, Take 1 capsule (50,000 Units total) by mouth every 7 (seven) days.    Past medical history, social, surgical and family history all reviewed in electronic medical record.  No pertanent information unless stated regarding to the chief complaint.   Review of Systems:  No headache, visual changes, nausea, vomiting, diarrhea, constipation, dizziness,  abdominal pain, skin rash, fevers, chills, night sweats, weight loss, swollen lymph nodes, body aches, joint swelling,chest pain, shortness of breath, mood changes.  Positive muscle aches  Objective  Blood pressure 140/90, pulse 73, height 5\' 6"  (1.676 m), weight 266 lb (120.7 kg), last menstrual period 02/09/2016, SpO2 98 %.     General: No apparent distress alert and oriented x3 mood and affect normal, dressed appropriately.  HEENT: Pupils equal, extraocular movements intact  Respiratory: Patient's speak in full sentences and does not appear short of breath  Cardiovascular: No lower extremity edema, non tender, no erythema  Skin: Warm dry intact with no signs of infection or rash on extremities or on axial skeleton.  Abdomen: Soft nontender  Neuro: Cranial nerves II through XII are intact, neurovascularly intact in all extremities with 2+ DTRs and 2+ pulses.  Lymph: No lymphadenopathy of posterior or anterior cervical chain or axillae bilaterally.  Gait antalgic using the aid of a cane MSK:  tender with full range of motion and good stability and symmetric strength and tone of shoulders, elbows, wrist, hip, knee and ankles bilaterally.    Right knee exam shows the patient does have a small effusion noted.  Abnormal thigh to calf ratio noted.  Patient does have lateral tracking of the patella with a positive grind.  Patient is tender to palpation  over the superior lateral aspect.  Instability of the patellofemoral.  Instability of the patellofemoral as well as some instability with valgus force. Contralateral knee positive patella grind as well.  Limited musculoskeletal ultrasound was performed and interpreted by Lyndal Pulley  Limited ultrasound of patient's right knee shows the patient does have narrowing of the patellofemoral joint noted.  Trace effusion noted.  Patient does have calcific changes noted of the lateral and medial meniscus noted.  No true tear appreciated  Impression: Patellofemoral arthritis  After informed written and verbal consent, patient was seated on exam table. Right knee was prepped with alcohol swab and utilizing anterolateral approach, patient's right knee space was injected with 4:1  marcaine 0.5%: Kenalog 40mg /dL. Patient tolerated the procedure well without immediate complications.   Impression and Recommendations:     This case required medical decision making of moderate complexity. The above documentation has been reviewed and is accurate and complete Lyndal Pulley, DO       Note: This dictation was prepared with Dragon dictation along with smaller phrase technology. Any transcriptional errors that result from this process are unintentional.

## 2019-07-10 NOTE — Progress Notes (Deleted)
[name] [place], [place] [address] Phone: [phone] Subjective:    I'm seeing this patient by the request  of:    CC:   [id number]  Barbara Reese is a 53 y.o. female coming in with complaint of ***  Onset-  Location Duration-  Character- Aggravating factors- Reliving factors-  Therapies tried-  Severity-     Past Medical History:  Diagnosis Date  . Achilles tendon disorder, left    PT HAS ON BOOT  . Allergic rhinitis   . Anemia    H/O  . Anxiety   . Depressive disorder    Hx  . 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 GERD (gastroesophageal reflux disease)   . Hepatitis    age 52- dx'd with chronic hepatitis- no problems as an adult  . Hypertension   . IBS (irritable bowel syndrome)    diet controlled, no meds  . Insomnia   . 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 OSA (obstructive sleep apnea)    Does not use CPAP.  Marland Kitchen Osteoarthritis    knees  . PONV (postoperative nausea and vomiting)   . SVD (spontaneous vaginal delivery)    x 1   Past Surgical History:  Procedure Laterality Date  . CHOLECYSTECTOMY  1987  . CYSTOSCOPY  02/18/2016   Procedure: CYSTOSCOPY;  Surgeon: Lavonia Drafts, MD;  Location: [facility];  Service: Gynecology;;  . HYSTEROSCOPY W/D&C N/A 12/30/2015   Procedure: DILATATION AND CURETTAGE [place];  Surgeon: Mora Bellman, MD;  Location: [facility];  Service: Gynecology;  Laterality: N/A;  . KNEE SURGERY  2004   left knee- Dr. Percell Miller  . MICROLARYNGOSCOPY N/A 11/29/2017   Procedure: MICROLARYNGOSCOPY;  Surgeon: Margaretha Sheffield, MD;  Location: [facility];  Service: ENT;  Laterality: N/A;  . ROBOTIC ASSISTED TOTAL HYSTERECTOMY WITH SALPINGECTOMY N/A 02/18/2016   Procedure: ROBOTIC ASSISTED TOTAL HYSTERECTOMY WITH SALPINGECTOMY;  Surgeon: Lavonia Drafts, MD;  Location: [facility];  Service:  Gynecology;  Laterality: N/A;  . UPPER GI ENDOSCOPY     gerd   Social History   Socioeconomic History  . Marital status: Single    Spouse name: Not on file  . Number of children: Not on file  . Years of education: Not on file  . Highest education level: Not on file  Occupational History  . 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 Financial resource strain: Not on file  . Food insecurity    Worry: Not on file    Inability: Not on file  . Transportation needs    Medical: Not on file    Non-medical: Not on file  Tobacco Use  . Smoking status: Never Smoker  . Smokeless tobacco: Never Used  Substance and Sexual Activity  . Alcohol use: Yes    Alcohol/week: 2.0 standard drinks    Types: 2 Glasses of wine per week    Comment: wine OCC  . Drug use: No  . Sexual activity: Never    Birth control/protection: None  Lifestyle  . Physical activity    Days per week: Not on file    Minutes per session: Not on file  . Stress: Not on file  Relationships  . Social Herbalist on phone: Not on file    Gets together: Not on file  Attends religious service: Not on file    Active member of club or organization: Not on file    Attends meetings of clubs or organizations: Not on file    Relationship status: Not on file  Other Topics Concern  . Not on file  Social History Narrative   Pt is single and lives with 38 year old son.   Allergies  Allergen Reactions  . Gabapentin Rash    Itchy rash   Family History  Problem Relation Age of Onset  . Osteoarthritis Mother   . Hypertension Mother   . Allergies Mother   . Rheum arthritis Mother   . Diabetes Father   . Hypertension Sister   . Heart attack Brother   . Heart attack Sister   . Allergies Son   . Allergies Sister   . Asthma Son   . Clotting disorder Sister   . Breast cancer Paternal Grandmother 66     Current Outpatient Medications (Cardiovascular):  .   hydrochlorothiazide (HYDRODIURIL) 25 MG tablet, TAKE 1 TABLET BY MOUTH ONCE DAILY  Current Outpatient Medications (Respiratory):  .  cetirizine (ZYRTEC) 10 MG tablet, Take 10 mg by mouth daily as needed for allergies. .  fluticasone (FLONASE) 50 MCG/ACT nasal spray, Place 1 spray into both nostrils daily. (Patient taking differently: Place 2 sprays into both nostrils daily as needed for allergies. )  Current Outpatient Medications (Analgesics):  .  ibuprofen (ADVIL,MOTRIN) 200 MG tablet, Take 600 mg by mouth 2 (two) times daily as needed for headache or moderate pain. .  naproxen (NAPROSYN) 500 MG tablet, Take 1 tablet (500 mg total) by mouth 2 (two) times daily with a meal. .  traMADol (ULTRAM) 50 MG tablet, TAKE 1 TABLET BY MOUTH EVERY 8 HOURS AS NEEDED FOR PAIN   Current Outpatient Medications (Other):  .  clobetasol cream (TEMOVATE) 0.05 %, APPLY TO THE AFFECTED AREA(S) TOPICALLY DAILY AS NEEDED. Marland Kitchen  cyclobenzaprine (FLEXERIL) 10 MG tablet, Take 10 mg by mouth 3 (three) times daily as needed for muscle spasms. .  Multiple Vitamin (MULTIVITAMIN WITH MINERALS) TABS tablet, Take 1 tablet by mouth daily. .  pantoprazole (PROTONIX) 40 MG tablet, TAKE 1 TABLET BY MOUTH ONCE DAILY .  Vitamin D, Ergocalciferol, (DRISDOL) 1.25 MG (50000 UT) CAPS capsule, Take 1 capsule (50,000 Units total) by mouth every 7 (seven) days.    Past medical history, social, surgical and family history all reviewed in electronic medical record.  No pertanent information unless stated regarding to the chief complaint.   Review of Systems:  No headache, visual changes, nausea, vomiting, diarrhea, constipation, dizziness, abdominal pain, skin rash, fevers, chills, night sweats, weight loss, swollen lymph nodes, body aches, joint swelling, muscle aches, chest pain, shortness of breath, mood changes.   Objective  Last menstrual period 02/09/2016. Systems examined below as of    General: No apparent distress alert and  oriented x3 mood and affect normal, dressed appropriately.  HEENT: Pupils equal, extraocular movements intact  Respiratory: Patient's speak in full sentences and does not appear short of breath  Cardiovascular: No lower extremity edema, non tender, no erythema  Skin: Warm dry intact with no signs of infection or rash on extremities or on axial skeleton.  Abdomen: Soft nontender  Neuro: Cranial nerves II through XII are intact, neurovascularly intact in all extremities with 2+ DTRs and 2+ pulses.  Lymph: No lymphadenopathy of posterior or anterior cervical chain or axillae bilaterally.  Gait normal with good balance and coordination.  MSK:  Non tender with full range of motion and good stability and symmetric strength and tone of shoulders, elbows, wrist, hip, knee and ankles bilaterally.     Impression and Recommendations:     This case required medical decision making of moderate complexity. The above documentation has been reviewed and is accurate and complete Lyndal Pulley, DO       Note: This dictation was prepared with Dragon dictation along with smaller phrase technology. Any transcriptional errors that result from this process are unintentional.

## 2019-07-11 ENCOUNTER — Ambulatory Visit: Payer: 59 | Admitting: Family Medicine

## 2019-07-27 ENCOUNTER — Ambulatory Visit (INDEPENDENT_AMBULATORY_CARE_PROVIDER_SITE_OTHER): Payer: 59 | Admitting: Family Medicine

## 2019-07-27 ENCOUNTER — Other Ambulatory Visit: Payer: Self-pay | Admitting: Family Medicine

## 2019-07-27 ENCOUNTER — Other Ambulatory Visit: Payer: Self-pay

## 2019-07-27 VITALS — BP 125/80 | HR 87 | Wt 266.6 lb

## 2019-07-27 DIAGNOSIS — M26609 Unspecified temporomandibular joint disorder, unspecified side: Secondary | ICD-10-CM | POA: Diagnosis not present

## 2019-07-27 DIAGNOSIS — M170 Bilateral primary osteoarthritis of knee: Secondary | ICD-10-CM

## 2019-07-27 DIAGNOSIS — I10 Essential (primary) hypertension: Secondary | ICD-10-CM | POA: Diagnosis not present

## 2019-07-27 DIAGNOSIS — E119 Type 2 diabetes mellitus without complications: Secondary | ICD-10-CM | POA: Diagnosis not present

## 2019-07-27 LAB — POCT GLYCOSYLATED HEMOGLOBIN (HGB A1C): HbA1c, POC (controlled diabetic range): 6.2 % (ref 0.0–7.0)

## 2019-07-27 MED FILL — VIT D2 1.25 MG (50,000 UNIT: 1.25 MG | 84 days supply | Qty: 12 | Fill #0

## 2019-07-27 NOTE — Progress Notes (Signed)
Date of Visit: 07/27/2019   HPI:  Diabetes - taking no diabetes medications currently. Does not check sugars. Has upcoming appointment for eye exam.  R ear pain - began one day ago. No drainage. No fevers. Saw ENT who thought this might be TMJ.  Hypertension - currently taking HCTZ 25mg  daily. No chest pain or shortness of breath.   Knee pain - saw Dr. Tamala Julian recently. Will need FMLA papers renewed soon. Misses up to 3 days a month, one day at a time. Takes tramadol occasionally for the pain, which helps. Had to go to ED for the pain last month and got toradol shot.  Health maintenance - had to cancel colonoscopy appointment because she got toradol shot at the ED.  ROS: See HPI.  [place]: history of GERD, depression, hypertension, type 2 diabetes   PHYSICAL EXAM: BP 125/80   Pulse 87   Wt 266 lb 9.6 oz (120.9 kg)   LMP 02/09/2016 (Exact Date)   SpO2 98%   BMI 43.03 kg/m  Gen: no acute distress, pleasant, cooeprative HEENT: normocephalic, atraumatic, moist mucous membranes, oropharynx clear, tympanic membranes clear bilaterally. + tenderness to palpation of the R TMJ joint musculature (recreates R ear pain) Heart: regular rate and rhythm, no murmur Lungs: clear to auscultation bilaterally, normal work of breathing  Neuro: speech normal, grossly nonfocal Ext: No appreciable lower extremity edema bilaterally  Diabetic foot exam: 2+ DP pulses bilat, normal monofilament testing bilaterally. No lesions or significant calluses.   ASSESSMENT/PLAN:  Health maintenance:  -patient will reschedule colonoscopy -has upcoming appointment for eye exam [facility]) next Wednesday -asked patient to contact employer about when last Tdap was and let us know -normal foot exam today -urine microalbumin done today  Arthritis of knee, degenerative Stable, followed by Dr. Tamala Julian. Will complete FMLA forms.  Hypertension Well controlled. Continue current regimen.   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 Recommend she discuss with her dentist. Patient agreeable. Gave handout on TMJ.  Type 2 diabetes mellitus (HCC) At goal, with excellent improvement in A1c off medications. Recheck in 6 months.   FOLLOW UP: Follow up in 6 mos for above issues  Barbara Reese, [place]

## 2019-07-27 NOTE — Patient Instructions (Signed)
It was great to see you again today!  Will fill out your FMLA paperwork  Talk to dentist about TMJ  Diabetes looks good Normal foot exam today  Checking urine today for protein Reschedule colonoscopy  Be well, Dr. [name]     Temporomandibular Joint Syndrome  Temporomandibular joint syndrome (TMJ syndrome) is a condition that causes pain in the temporomandibular joints. These joints are located near your ears and allow your jaw to open and close. For people with TMJ syndrome, chewing, biting, or other movements of the jaw can be difficult or painful. TMJ syndrome is often mild and goes away within a few weeks. However, sometimes the condition becomes a long-term (chronic) problem. What are the causes? This condition may be caused by:  Grinding your teeth or clenching your jaw. Some people do this when they are under stress.  Arthritis.  Injury to the jaw.  Head or neck injury.  Teeth or dentures that are not aligned well. In some cases, the cause of TMJ syndrome may not be known. What are the signs or symptoms? The most common symptom of this condition is an aching pain on the side of the head in the area of the TMJ. Other symptoms may include:  Pain when moving your jaw, such as when chewing or biting.  Being unable to open your jaw all the way.  Making a clicking sound when you open your mouth.  Headache.  Earache.  Neck or shoulder pain. How is this diagnosed? This condition may be diagnosed based on:  Your symptoms and medical history.  A physical exam. Your health care provider may check the range of motion of your jaw.  Imaging tests, such as X-rays or an MRI. You may also need to see your dentist, who will determine if your teeth and jaw are lined up correctly. How is this treated? TMJ syndrome often goes away on its own. If treatment is needed, the options may include:  Eating soft foods and applying ice or heat.  Medicines to relieve pain or  inflammation.  Medicines or massage to relax the muscles.  A splint, bite plate, or mouthpiece to prevent teeth grinding or jaw clenching.  Relaxation techniques or counseling to help reduce stress.  A therapy for pain in which an electrical current is applied to the nerves through the skin (transcutaneous electrical nerve stimulation).  Acupuncture. This is sometimes helpful to relieve pain.  Jaw surgery. This is rarely needed. Follow these instructions at home:  Eating and drinking  Eat a soft diet if you are having trouble chewing.  Avoid foods that require a lot of chewing. Do not chew gum. General instructions  Take over-the-counter and prescription medicines only as told by your health care provider.  If directed, put ice on the painful area. ? Put ice in a plastic bag. ? Place a towel between your skin and the bag. ? Leave the ice on for 20 minutes, 2-3 times a day.  Apply a warm, wet cloth (warm compress) to the painful area as directed.  Massage your jaw area and do any jaw stretching exercises as told by your health care provider.  If you were given a splint, bite plate, or mouthpiece, wear it as told by your health care provider.  Keep all follow-up visits as told by your health care provider. This is important. Contact a health care provider if:  You are having trouble eating.  You have new or worsening symptoms. Get help right away if:  Your jaw locks open or closed. Summary  Temporomandibular joint syndrome (TMJ syndrome) is a condition that causes pain in the temporomandibular joints. These joints are located near your ears and allow your jaw to open and close.  TMJ syndrome is often mild and goes away within a few weeks. However, sometimes the condition becomes a long-term (chronic) problem.  Symptoms include an aching pain on the side of the head in the area of the TMJ, pain when chewing or biting, and being unable to open your jaw all the way. You  may also make a clicking sound when you open your mouth.  TMJ syndrome often goes away on its own. If treatment is needed, it may include medicines to relieve pain, reduce inflammation, or relax the muscles. A splint, bite plate, or mouthpiece may also be used to prevent teeth grinding or jaw clenching. This information is not intended to replace advice given to you by your health care provider. Make sure you discuss any questions you have with your health care provider. Document Released: 05/05/2001 Document Revised: 10/22/2017 Document Reviewed: 09/21/2017 Elsevier Patient Education  2020 [employer].

## 2019-07-27 NOTE — Assessment & Plan Note (Signed)
Well-controlled.  Continue current regimen. 

## 2019-07-27 NOTE — Assessment & Plan Note (Signed)
At goal, with excellent improvement in A1c off medications. Recheck in 6 months.

## 2019-07-27 NOTE — Assessment & Plan Note (Signed)
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 Recommend she discuss with her dentist. Patient agreeable. Gave handout on TMJ.

## 2019-07-27 NOTE — Assessment & Plan Note (Signed)
Stable, followed by Dr. Tamala Julian. Will complete FMLA forms.

## 2019-07-28 MED FILL — FLUTICASONE PROP 50 MCG SPR: 50 | 30 days supply | Qty: 16 | Fill #0

## 2019-07-31 LAB — SPECIMEN STATUS REPORT

## 2019-08-01 LAB — MICROALBUMIN / CREATININE URINE RATIO
Creatinine, Urine: 235.2 mg/dL
Creatinine, Urine: 235.2 mg/dL
Microalb/Creat Ratio: 6 mg/g creat (ref 0–29)
Microalb/Creat Ratio: 6 mg/g creat (ref 0–29)
Microalbumin, Urine: 14.2 ug/mL
Microalbumin, Urine: 14.2 ug/mL

## 2019-08-02 ENCOUNTER — Encounter: Payer: Self-pay | Admitting: Family Medicine

## 2019-08-02 DIAGNOSIS — Z0100 Encounter for examination of eyes and vision without abnormal findings: Secondary | ICD-10-CM | POA: Diagnosis not present

## 2019-08-02 LAB — HM DIABETES EYE EXAM

## 2019-08-04 ENCOUNTER — Telehealth: Payer: Self-pay | Admitting: Family Medicine

## 2019-08-04 NOTE — Telephone Encounter (Signed)
Received FMLA papers, completed. Will return to [facility] RN team. Copy will need to be scanned into chart & papers faxed back to [employer].  [name], MD

## 2019-08-07 NOTE — Telephone Encounter (Signed)
Placed in to be faxed pile, copy placed in batch scanning. Barbara Reese, CMA

## 2019-08-08 ENCOUNTER — Other Ambulatory Visit: Payer: Self-pay | Admitting: Family Medicine

## 2019-08-08 ENCOUNTER — Ambulatory Visit: Payer: 59 | Admitting: Family Medicine

## 2019-08-08 MED FILL — traMADol HCL 50 MG TABS: 50 | 10 days supply | Qty: 30 | Fill #0

## 2019-08-08 NOTE — Telephone Encounter (Signed)
Pt is calling for a refill on her flexeril, BP medication  and her Protonix.

## 2019-08-11 MED FILL — HYDROCHLOROTHIAZIDE 25 MG T: 25 | 90 days supply | Qty: 90 | Fill #0

## 2019-08-11 MED FILL — PANTOPRAZOLE SOD DR 40 MG T: 40 | 90 days supply | Qty: 90 | Fill #0

## 2019-08-11 NOTE — Telephone Encounter (Signed)
Red team - please clarify flexeril request since this is not currently on her medication list. What is she taking it for? How often does she take it?  Thanks [name], MD

## 2019-08-14 ENCOUNTER — Telehealth: Payer: Self-pay

## 2019-08-14 NOTE — Telephone Encounter (Signed)
Patient returned call and states that she has not used Flexeril in 3 months plus, however,t recently her lower back spasms have returned.  Patient states that she only uses flexeril PRN.  Ozella Almond, [place]

## 2019-08-14 NOTE — Telephone Encounter (Signed)
Called patient and LVM to call office concerning her request for Flexeril.  Barbara Reese, [place]

## 2019-08-15 MED FILL — FLUTICASONE PROP 50 MCG SPR: 50 | 30 days supply | Qty: 16 | Fill #0

## 2019-08-15 MED FILL — VIT D2 1.25 MG (50,000 UNIT: 1.25 MG | 84 days supply | Qty: 12 | Fill #0

## 2019-08-16 MED ORDER — CYCLOBENZAPRINE HCL 10 MG PO TABS: 10.0000 mg | ORAL_TABLET | Freq: Three times a day (TID) | ORAL | 0 refills | Status: DC | PRN

## 2019-08-16 NOTE — Addendum Note (Signed)
Addended by: [name] on: 08/16/2019 06:12 PM   Modules accepted: Orders

## 2019-08-16 NOTE — Telephone Encounter (Signed)
LMOVM for pt to call back and clarify if she is taking this medicine or not. Mettie Roylance Kennon Holter, CMA

## 2019-08-17 MED FILL — CYCLOBENZAPRINE 10 MG TAB: 10 | 10 days supply | Qty: 30 | Fill #0

## 2019-09-25 IMAGING — MR MR ANKLE*L* W/O CM
4 of 5 series · 15 of 40 positions shown · non-contrast
Comparison: None.

CLINICAL DATA: Pain and swelling since [REDACTED]. Posterior ankle
pain.

EXAM:
MRI OF THE LEFT ANKLE WITHOUT CONTRAST
TECHNIQUE: Multiplanar, multisequence MR imaging of the ankle was performed. No
intravenous contrast was administered.

[Series 3: T2 fat-sat · axial · 3.5mm · 0.28mm/px · z∈[-83,+30]mm · 6 of 34 slices shown (1 of 2)]
[im 1/34]
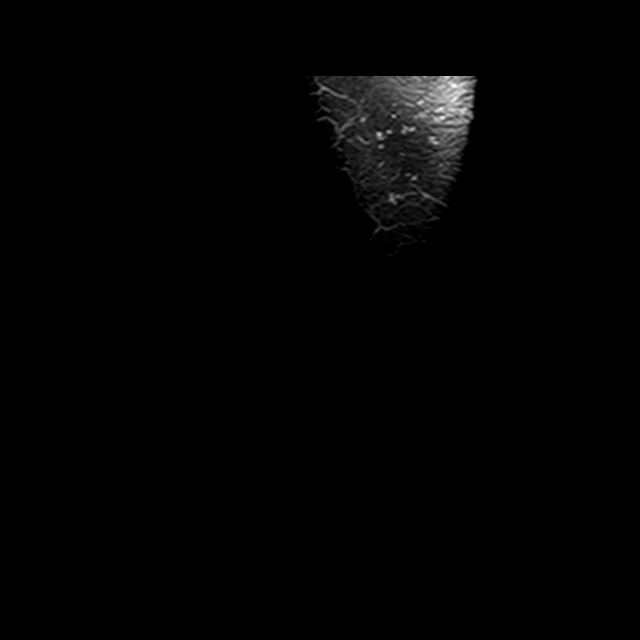
[im 5/34]
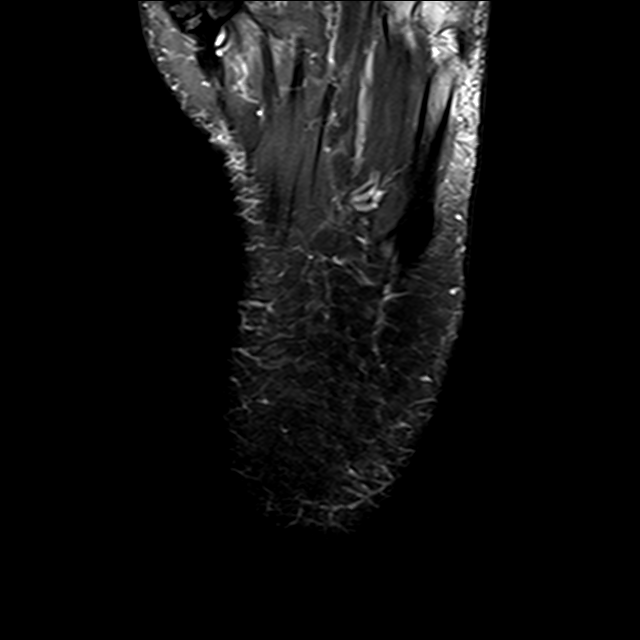
[im 9/34]
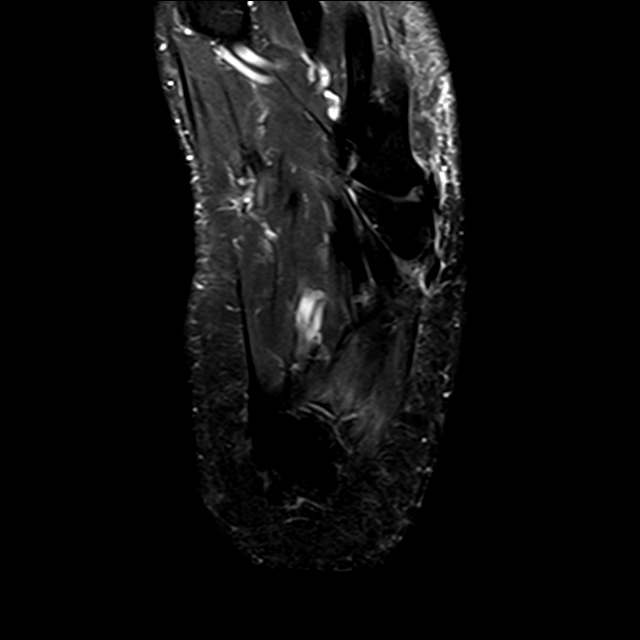
[im 13/34]
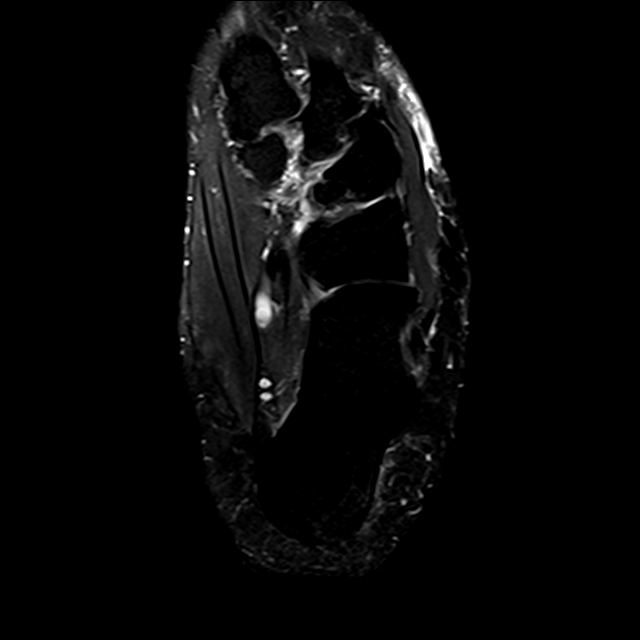
[im 17/34]
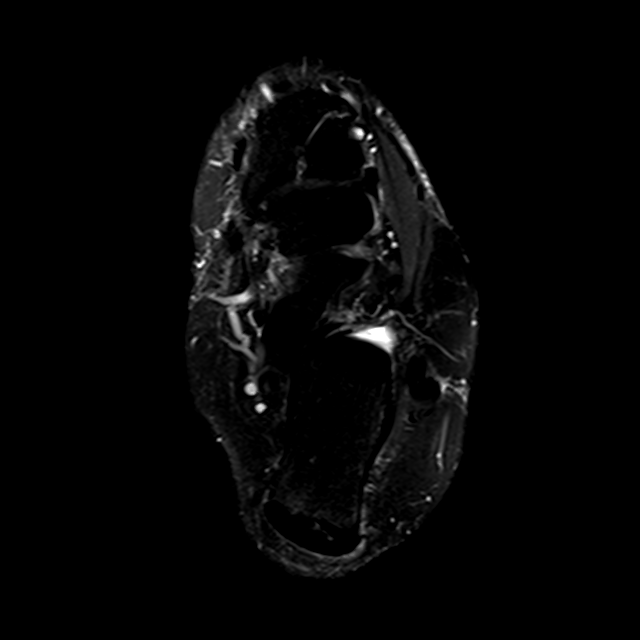
[im 29/34]
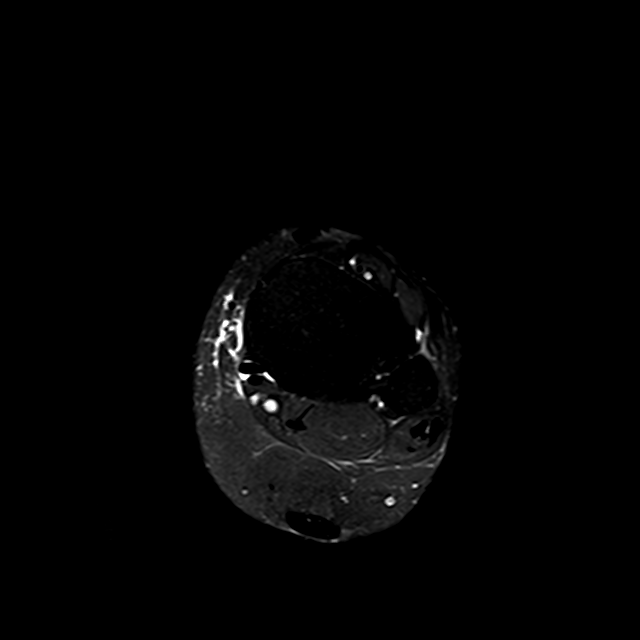

[Series 4: T1 · axial · 3.5mm · 0.28mm/px · z∈[-70,+34]mm · 3 of 34 slices shown (1 of 2)]
[im 4/34]
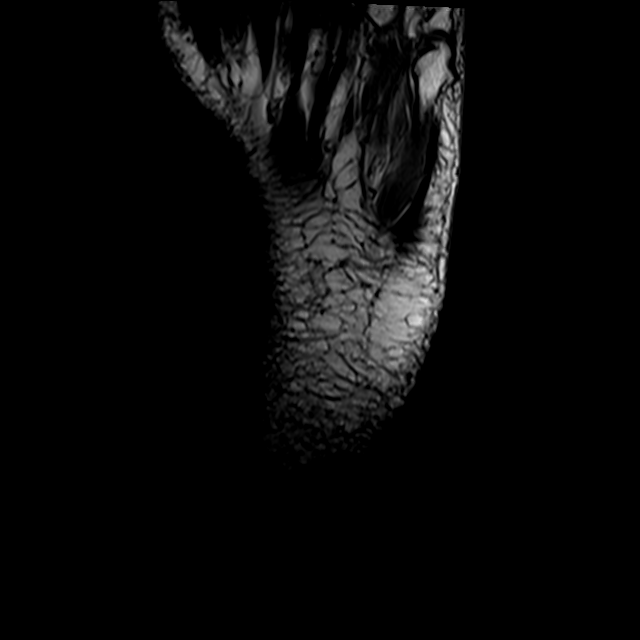
[im 19/34]
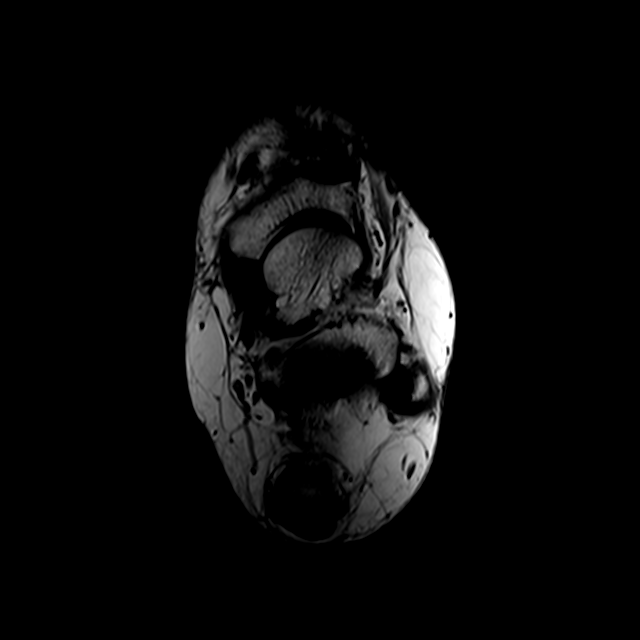
[im 30/34]
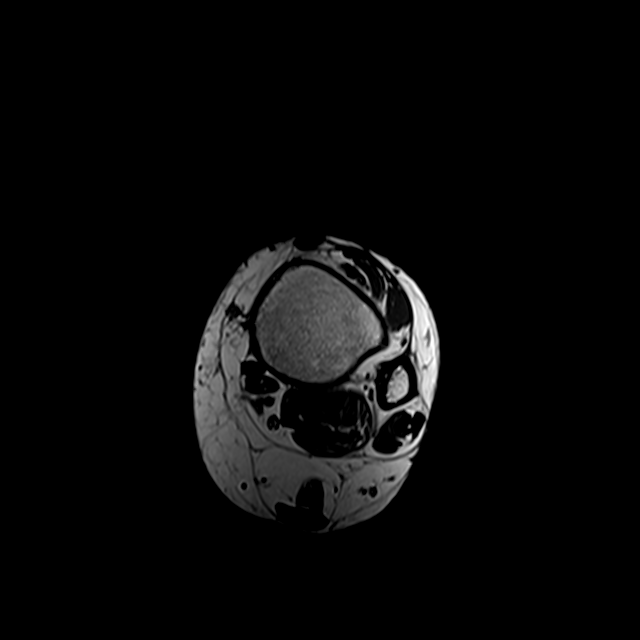

[Series 5: T1 · sagittal · 3.5mm · 0.33mm/px · 3 of 19 slices shown (2 of 2)]
[im 4/19]
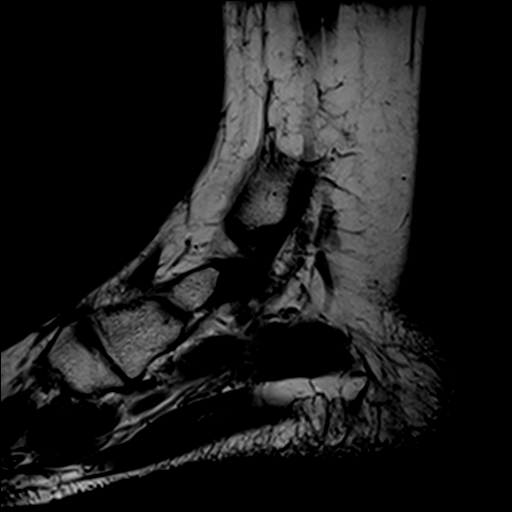
[im 11/19]
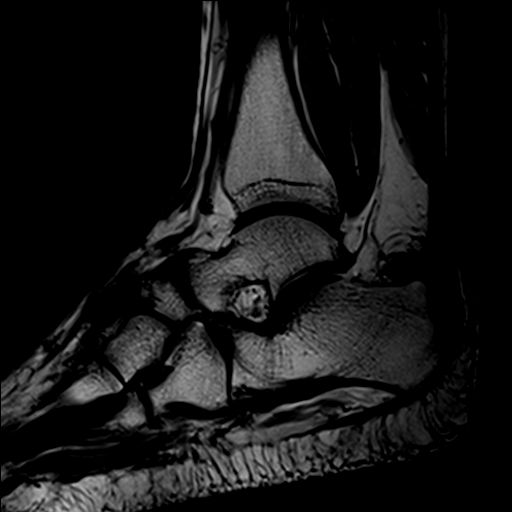
[im 19/19]
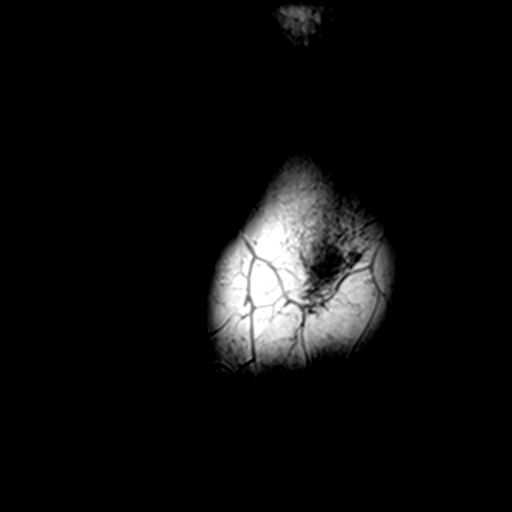

[Series 7: T2 fat-sat · coronal · 3.3mm · 0.35mm/px · 3 of 30 slices shown (2 of 2)]
[im 4/30]
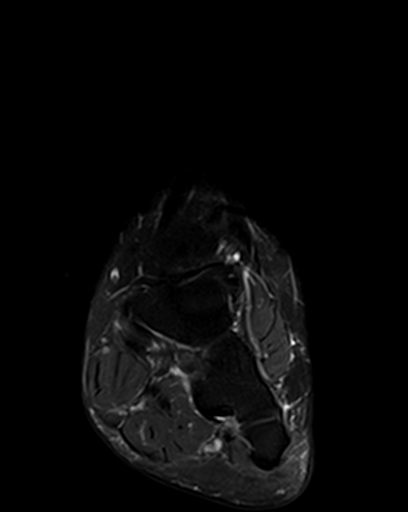
[im 15/30]
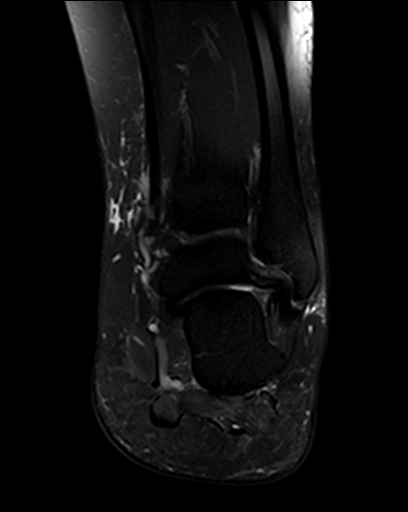
[im 26/30]
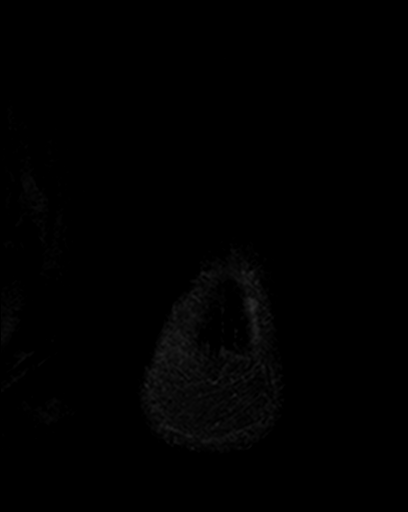

[15 of 40 positions shown; findings below may reference images not displayed]

FINDINGS: TENDONS

Peroneal: Peroneal longus tendon intact. Peroneal brevis intact.

Posteromedial: Posterior tibial tendon intact. Flexor hallucis
longus tendon intact. Flexor digitorum longus tendon intact.

Anterior: Tibialis anterior tendon intact. Extensor hallucis longus
tendon intact Extensor digitorum longus tendon intact.

Achilles: Moderate tendinosis of the Achilles tendon without a tear.
Moderate amount of fluid in the retrocalcaneal bursa with
surrounding soft tissue edema.

Plantar Fascia: Intact.

LIGAMENTS

Lateral: Anterior talofibular ligament intact. Calcaneofibular
ligament intact. Posterior talofibular ligament intact. Anterior and
posterior tibiofibular ligaments intact.

Medial: Deltoid ligament intact. Spring ligament intact.

CARTILAGE

Ankle Joint: No joint effusion. Normal ankle mortise. No chondral
defect. Mild subchondral reactive marrow changes in the medial
corner of the talar dome.

Subtalar Joints/Sinus Tarsi: Normal subtalar joints. No subtalar
joint effusion. Normal sinus tarsi.

Bones: No marrow signal abnormality.  No fracture or dislocation.

Soft Tissue: No fluid collection or hematoma.  Muscles are normal.
IMPRESSION: 1. Moderate tendinosis of the distal Achilles tendon. Moderate
retrocalcaneal bursitis.

## 2019-10-30 MED FILL — FLUTICASONE PROP 50 MCG SPR: 50 | 30 days supply | Qty: 16 | Fill #1

## 2019-12-06 MED FILL — FLUTICASONE PROP 50 MCG SPR: 50 | 30 days supply | Qty: 16 | Fill #2

## 2019-12-06 MED FILL — HYDROCHLOROTHIAZIDE 25 MG T: 25 | 90 days supply | Qty: 90 | Fill #1

## 2019-12-06 MED FILL — PANTOPRAZOLE SOD DR 40 MG T: 40 | 90 days supply | Qty: 90 | Fill #1

## 2019-12-21 ENCOUNTER — Ambulatory Visit: Payer: 59 | Admitting: Family Medicine

## 2019-12-26 ENCOUNTER — Other Ambulatory Visit: Payer: Self-pay | Admitting: Family Medicine

## 2019-12-29 MED FILL — CLOBETASOL PROPIONATE 0.05: 0.05 | 30 days supply | Qty: 45 | Fill #0

## 2020-01-18 MED FILL — CLOBETASOL PROPIONATE 0.05: 0.05 | 30 days supply | Qty: 45 | Fill #0

## 2020-01-31 NOTE — Progress Notes (Signed)
  Date of Visit: 02/01/2020   SUBJECTIVE:   HPI:  [place] presents today for routine follow up.  Knee pain - overall stable. Sees Dr. Tamala Julian with sports medicine. Takes tramadol about once a month for this as well as back spasms. Needs FMLA papers renewed - misses up to 3 days of work per month whenever pain flairs up (1 day per episode).  Lichen planus - uses clobetasol on her skin lesions as needed, about once per month.  Diabetes - not on any medications currently. Does not check her sugars. Had [facility].  GERD - taking protonix 40mg  daily, tolerating well, symptoms well controlled with this medication.  Seasonal allergies - takes zyrtec and flonase with good relief.  OBJECTIVE:   BP 132/80   Pulse 79   Wt 270 lb 9.6 oz (122.7 kg)   LMP 02/09/2016 (Exact Date)   SpO2 97%   BMI 43.68 kg/m  Gen: no acute distress, pleasant, cooperative HEENT: normocephalic, atraumatic  Heart: regular rate and rhythm, no murmur Lungs: clear to auscultation bilaterally, normal work of breathing  Neuro: alert, speech normal, grossly nonfocal Ext: knees without erythema or noteworthy effusion. Full active ROM with flexion & extension.  ASSESSMENT/PLAN:   Health maintenance:  -hep C antibody drawn today -will request eye records from [facility] -discussed options for screening modalities for colon cancer - patient elects FIT testing, ordered -advised to contact employer regarding date of last tetanus shot, and send me info over mychart -has received both doses of COVID vaccination  Type 2 diabetes mellitus ([place]) Remains diet controlled, A1c 6.8. Check lipids today, along with CMET Request eye records Follow up 6 months   Lichen planus hypertrophicus Well controlled. Continue current medication regimen.   Arthritis of knee, degenerative Overall stable. Will renew FMLA paperwork when received.  GERD Well controlled. Continue current medication regimen.    Seasonal allergies Well controlled with flonase and zyrtec, continue.  FOLLOW UP: Follow up in 6 mos for above issues  [place] J. [name], [place]

## 2020-02-01 ENCOUNTER — Ambulatory Visit (INDEPENDENT_AMBULATORY_CARE_PROVIDER_SITE_OTHER): Payer: 59 | Admitting: Family Medicine

## 2020-02-01 ENCOUNTER — Other Ambulatory Visit: Payer: Self-pay

## 2020-02-01 ENCOUNTER — Encounter: Payer: Self-pay | Admitting: Family Medicine

## 2020-02-01 VITALS — BP 132/80 | HR 79 | Wt 270.6 lb

## 2020-02-01 DIAGNOSIS — K219 Gastro-esophageal reflux disease without esophagitis: Secondary | ICD-10-CM

## 2020-02-01 DIAGNOSIS — Z1211 Encounter for screening for malignant neoplasm of colon: Secondary | ICD-10-CM

## 2020-02-01 DIAGNOSIS — E119 Type 2 diabetes mellitus without complications: Secondary | ICD-10-CM

## 2020-02-01 DIAGNOSIS — Z1159 Encounter for screening for other viral diseases: Secondary | ICD-10-CM | POA: Diagnosis not present

## 2020-02-01 DIAGNOSIS — J302 Other seasonal allergic rhinitis: Secondary | ICD-10-CM | POA: Diagnosis not present

## 2020-02-01 DIAGNOSIS — L430 Hypertrophic lichen planus: Secondary | ICD-10-CM

## 2020-02-01 DIAGNOSIS — E559 Vitamin D deficiency, unspecified: Secondary | ICD-10-CM

## 2020-02-01 DIAGNOSIS — M170 Bilateral primary osteoarthritis of knee: Secondary | ICD-10-CM

## 2020-02-01 LAB — POCT GLYCOSYLATED HEMOGLOBIN (HGB A1C): HbA1c, POC (controlled diabetic range): 6.8 % (ref 0.0–7.0)

## 2020-02-01 NOTE — Assessment & Plan Note (Signed)
Remains diet controlled, A1c 6.8. Check lipids today, along with CMET Request eye records Follow up 6 months

## 2020-02-01 NOTE — Assessment & Plan Note (Signed)
Well controlled. Continue current medication regimen.  

## 2020-02-01 NOTE — Assessment & Plan Note (Signed)
Well controlled with flonase and zyrtec, continue.

## 2020-02-01 NOTE — Patient Instructions (Signed)
Labs today - kidneys, liver, cholesterol, hepatitis C, vitamin D  Bring back stool sample to [place]  Follow up with me in 6 months, sooner if needed  Be well, Dr. [name]

## 2020-02-01 NOTE — Assessment & Plan Note (Signed)
Overall stable. Will renew FMLA paperwork when received.

## 2020-02-02 ENCOUNTER — Telehealth: Payer: Self-pay | Admitting: Family Medicine

## 2020-02-02 ENCOUNTER — Other Ambulatory Visit (HOSPITAL_COMMUNITY): Payer: Self-pay | Admitting: Family Medicine

## 2020-02-02 DIAGNOSIS — E559 Vitamin D deficiency, unspecified: Secondary | ICD-10-CM

## 2020-02-02 LAB — CMP14+EGFR
ALT: 13 IU/L (ref 0–32)
AST: 13 IU/L (ref 0–40)
Albumin/Globulin Ratio: 1.3 (ref 1.2–2.2)
Albumin: 4.3 g/dL (ref 3.8–4.9)
Alkaline Phosphatase: 58 IU/L (ref 48–121)
BUN/Creatinine Ratio: 14 (ref 9–23)
BUN: 12 mg/dL (ref 6–24)
Bilirubin Total: 0.5 mg/dL (ref 0.0–1.2)
CO2: 25 mmol/L (ref 20–29)
Calcium: 9.6 mg/dL (ref 8.7–10.2)
Chloride: 99 mmol/L (ref 96–106)
Creatinine, Ser: 0.87 mg/dL (ref 0.57–1.00)
GFR calc Af Amer: 87 mL/min/{1.73_m2} (ref >59)
GFR calc non Af Amer: 76 mL/min/{1.73_m2} (ref >59)
Globulin, Total: 3.3 g/dL (ref 1.5–4.5)
Glucose: 144 mg/dL — ABNORMAL HIGH (ref 65–99)
Potassium: 3.9 mmol/L (ref 3.5–5.2)
Sodium: 138 mmol/L (ref 134–144)
Total Protein: 7.6 g/dL (ref 6.0–8.5)

## 2020-02-02 LAB — LIPID PANEL
Chol/HDL Ratio: 3.1 ratio (ref 0.0–4.4)
Cholesterol, Total: 206 mg/dL — ABNORMAL HIGH (ref 100–199)
HDL: 66 mg/dL (ref >39)
LDL Chol Calc (NIH): 123 mg/dL — ABNORMAL HIGH (ref 0–99)
Triglycerides: 96 mg/dL (ref 0–149)
VLDL Cholesterol Cal: 17 mg/dL (ref 5–40)

## 2020-02-02 LAB — HEPATITIS C ANTIBODY: Hep C Virus Ab: <0.1 s/co ratio (ref 0.0–0.9)

## 2020-02-02 LAB — VITAMIN D 25 HYDROXY (VIT D DEFICIENCY, FRACTURES): Vit D, 25-Hydroxy: 19 ng/mL — ABNORMAL LOW (ref 30.0–100.0)

## 2020-02-02 MED ORDER — HYDROCHLOROTHIAZIDE 25 MG PO TABS: 25.0000 mg | ORAL_TABLET | Freq: Every day | ORAL | 3 refills | Status: DC

## 2020-02-02 MED ORDER — PANTOPRAZOLE SODIUM 40 MG PO TBEC: 40.0000 mg | DELAYED_RELEASE_TABLET | Freq: Every day | ORAL | 3 refills | Status: DC

## 2020-02-02 MED ORDER — CHOLECALCIFEROL 1.25 MG (50000 UT) PO TABS: 1.0000 | ORAL_TABLET | ORAL | 0 refills | Status: DC

## 2020-02-02 MED ORDER — PRAVASTATIN SODIUM 10 MG PO TABS: 10.0000 mg | ORAL_TABLET | Freq: Every day | ORAL | 3 refills | Status: DC

## 2020-02-02 MED ORDER — ATORVASTATIN CALCIUM 10 MG PO TABS
10.0000 mg | ORAL_TABLET | Freq: Every day | ORAL | 3 refills | Status: DC
Start: 2020-02-02 — End: 2021-05-13

## 2020-02-02 NOTE — Addendum Note (Signed)
Addended by: [name] on: 02/02/2020 11:50 AM   Modules accepted: Orders

## 2020-02-02 NOTE — Telephone Encounter (Signed)
Addendum - patient called back and requested lowest dose of pravastatin instead. Some statin is better than no statin. Will rx pravastatin 10mg  daily.  Also I received her FMLA forms and have completed them. Will return to [facility] RN team.  [name], MD

## 2020-02-02 NOTE — Telephone Encounter (Signed)
Called patient to review results Vit d low - will replete with 50k units weekly for 12 weeks, then recheck level LDL elevated, as is 10 year ASCVD risk. After discussion, patient agreeable to starting statin but wants lowest dose possible. Will rx atorvastatin 10mg  daily.  counseled on weight loss, diet control to help lower cholesterol Patient appreciative  [name], MD

## 2020-02-05 NOTE — Telephone Encounter (Signed)
Faxed and copy made for batch scanning.

## 2020-02-27 MED FILL — PANTOPRAZOLE SOD DR 40 MG T: 40 | 90 days supply | Qty: 90 | Fill #0

## 2020-02-27 MED FILL — FLUTICASONE PROP 50 MCG SPR: 50 | 30 days supply | Qty: 16 | Fill #3

## 2020-02-27 MED FILL — HYDROCHLOROTHIAZIDE 25 MG T: 25 | 90 days supply | Qty: 90 | Fill #0

## 2020-03-19 ENCOUNTER — Ambulatory Visit: Payer: 59 | Admitting: Family Medicine

## 2020-03-26 ENCOUNTER — Ambulatory Visit: Payer: 59 | Admitting: Family Medicine

## 2020-04-03 ENCOUNTER — Ambulatory Visit: Payer: 59 | Admitting: Family Medicine

## 2020-04-03 NOTE — Progress Notes (Deleted)
[place] Phone: [phone] Subjective:    I'm seeing this patient by the request  of:  [name], MD  CC:   [id number]  Barbara Reese is a 54 y.o. female coming in with complaint of ***  Onset-  Location Duration-  Character- Aggravating factors- Reliving factors-  Therapies tried-  Severity-   Patient previously had an MRI in 2017 of the lumbar spine.  This was independently visualized by me showing a disc herniation at the L4-L5 with L4 nerve impingement.  Patient did undergo 1 injection in February 2018.  Past Medical History:  Diagnosis Date  . Achilles tendon disorder, left    PT HAS ON BOOT  . Allergic rhinitis   . Anemia    H/O  . Anxiety   . Depressive disorder    Hx  . 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 GERD (gastroesophageal reflux disease)   . Hepatitis    age 46- dx'd with chronic hepatitis- no problems as an adult  . Hypertension   . IBS (irritable bowel syndrome)    diet controlled, no meds  . Insomnia   . 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 OSA (obstructive sleep apnea)    Does not use CPAP.  Marland Kitchen Osteoarthritis    knees  . PONV (postoperative nausea and vomiting)   . SVD (spontaneous vaginal delivery)    x 1   Past Surgical History:  Procedure Laterality Date  . CHOLECYSTECTOMY  1987  . CYSTOSCOPY  02/18/2016   Procedure: CYSTOSCOPY;  Surgeon: Lavonia Drafts, MD;  Location: [facility];  Service: Gynecology;;  . HYSTEROSCOPY WITH D & C N/A 12/30/2015   Procedure: DILATATION AND CURETTAGE [place];  Surgeon: Mora Bellman, MD;  Location: [facility];  Service: Gynecology;  Laterality: N/A;  . KNEE SURGERY  2004   left knee- Dr. Percell Miller  . MICROLARYNGOSCOPY N/A 11/29/2017   Procedure: MICROLARYNGOSCOPY;  Surgeon: Margaretha Sheffield, MD;  Location: [facility];   Service: ENT;  Laterality: N/A;  . ROBOTIC ASSISTED TOTAL HYSTERECTOMY WITH SALPINGECTOMY N/A 02/18/2016   Procedure: ROBOTIC ASSISTED TOTAL HYSTERECTOMY WITH SALPINGECTOMY;  Surgeon: Lavonia Drafts, MD;  Location: [facility];  Service: Gynecology;  Laterality: N/A;  . UPPER GI ENDOSCOPY     gerd   Social History   Socioeconomic History  . Marital status: Single    Spouse name: Not on file  . Number of children: Not on file  . Years of education: Not on file  . Highest education level: Not on file  Occupational History  . Occupation: NICU    Employer: [employer]  Tobacco Use  . Smoking status: Never Smoker  . Smokeless tobacco: Never Used  Vaping Use  . Vaping Use: Never used  Substance and Sexual Activity  . Alcohol use: Yes    Alcohol/week: 2.0 standard drinks    Types: 2 Glasses of wine per week    Comment: wine OCC  . Drug use: No  . Sexual activity: Never    Birth control/protection: None  Other Topics Concern  . Not on file  Social History Narrative   Pt is single and lives with 40 year old son.   Social Determinants of Health   Financial Resource Strain:   . Difficulty of Paying Living Expenses:   Food Insecurity:   . Worried  About Running Out of Food in the Last Year:   . [place] in the Last Year:   Transportation Needs:   . Lack of Transportation (Medical):   Marland Kitchen Lack of Transportation (Non-Medical):   Physical Activity:   . Days of Exercise per Week:   . Minutes of Exercise per Session:   Stress:   . Feeling of Stress :   Social Connections:   . Frequency of Communication with Friends and Family:   . Frequency of Social Gatherings with Friends and Family:   . Attends Religious Services:   . Active Member of Clubs or Organizations:   . Attends Archivist Meetings:   Marland Kitchen Marital Status:    Allergies  Allergen Reactions  . Gabapentin Rash    Itchy rash   Family History  Problem Relation Age of  Onset  . Osteoarthritis Mother   . Hypertension Mother   . Allergies Mother   . Rheum arthritis Mother   . Diabetes Father   . Hypertension Sister   . Heart attack Brother   . Heart attack Sister   . Allergies Son   . Allergies Sister   . Asthma Son   . Clotting disorder Sister   . Breast cancer Paternal Grandmother 49     Current Outpatient Medications (Cardiovascular):  .  atorvastatin (LIPITOR) 10 MG tablet, Take 1 tablet (10 mg total) by mouth daily. .  hydrochlorothiazide (HYDRODIURIL) 25 MG tablet, Take 1 tablet (25 mg total) by mouth daily. .  pravastatin (PRAVACHOL) 10 MG tablet, Take 1 tablet (10 mg total) by mouth daily.  Current Outpatient Medications (Respiratory):  .  cetirizine (ZYRTEC) 10 MG tablet, Take 10 mg by mouth daily as needed for allergies. .  fluticasone (FLONASE) 50 MCG/ACT nasal spray, Place 2 sprays into both nostrils daily as needed for allergies.  Current Outpatient Medications (Analgesics):  .  ibuprofen (ADVIL,MOTRIN) 200 MG tablet, Take 600 mg by mouth 2 (two) times daily as needed for headache or moderate pain. .  traMADol (ULTRAM) 50 MG tablet, TAKE 1 TABLET BY MOUTH EVERY 8 HOURS AS NEEDED FOR PAIN   Current Outpatient Medications (Other):  Marland Kitchen  Cholecalciferol 1.25 MG (50000 UT) TABS, Take 1 tablet by mouth once a week. For 12 weeks .  clobetasol cream (TEMOVATE) 0.05 %, APPLY TO THE AFFECTED AREA(S) TOPICALLY DAILY AS NEEDED. Marland Kitchen  cyclobenzaprine (FLEXERIL) 10 MG tablet, Take 1 tablet (10 mg total) by mouth 3 (three) times daily as needed for muscle spasms. .  Multiple Vitamin (MULTIVITAMIN WITH MINERALS) TABS tablet, Take 1 tablet by mouth daily. .  pantoprazole (PROTONIX) 40 MG tablet, Take 1 tablet (40 mg total) by mouth daily.   Reviewed prior external information including notes and imaging from  primary care provider As well as notes that were available from care everywhere and other healthcare systems.  Past medical history, social,  surgical and family history all reviewed in electronic medical record.  No pertanent information unless stated regarding to the chief complaint.   Review of Systems:  No headache, visual changes, nausea, vomiting, diarrhea, constipation, dizziness, abdominal pain, skin rash, fevers, chills, night sweats, weight loss, swollen lymph nodes, body aches, joint swelling, chest pain, shortness of breath, mood changes. POSITIVE muscle aches  Objective  Last menstrual period 02/09/2016.   General: No apparent distress alert and oriented x3 mood and affect normal, dressed appropriately.  HEENT: Pupils equal, extraocular movements intact  Respiratory: Patient's speak in full sentences and does  not appear short of breath  Cardiovascular: No lower extremity edema, non tender, no erythema  Neuro: Cranial nerves II through XII are intact, neurovascularly intact in all extremities with 2+ DTRs and 2+ pulses.  Gait normal with good balance and coordination.  MSK:  Non tender with full range of motion and good stability and symmetric strength and tone of shoulders, elbows, wrist, hip, knee and ankles bilaterally.     Impression and Recommendations:     The above documentation has been reviewed and is accurate and complete Lyndal Pulley, DO       Note: This dictation was prepared with Dragon dictation along with smaller phrase technology. Any transcriptional errors that result from this process are unintentional.

## 2020-04-11 ENCOUNTER — Ambulatory Visit: Payer: 59 | Admitting: Family Medicine

## 2020-04-15 ENCOUNTER — Ambulatory Visit: Payer: 59 | Admitting: Family Medicine

## 2020-04-26 ENCOUNTER — Telehealth: Payer: 59 | Admitting: Physician Assistant

## 2020-04-26 ENCOUNTER — Other Ambulatory Visit: Payer: Self-pay | Admitting: Family Medicine

## 2020-04-26 DIAGNOSIS — H6690 Otitis media, unspecified, unspecified ear: Secondary | ICD-10-CM

## 2020-04-26 MED ORDER — AMOXICILLIN-POT CLAVULANATE 875-125 MG PO TABS: 1.0000 | ORAL_TABLET | Freq: Two times a day (BID) | ORAL | 0 refills | Status: DC

## 2020-04-26 MED FILL — AMOX-CLAV 875-125 MG TABLET: 875-125 | 7 days supply | Qty: 14 | Fill #0

## 2020-04-26 NOTE — Progress Notes (Signed)
E Visit for Swimmer's Ear  We are sorry that you are not feeling well. Here is how we plan to help!  Given your symptoms, history of eustachian tube disorder and headache I suspect you have an inner ear infection.  I do not believe that you have a sinus infection. I will prescribe antibiotics, but if the symptoms are not improving , it is important that you have a face to face visit for further evaluation.  I will prescribe augmentin.  In certain cases ear infections may progress to a more serious bacterial infection of the inner ear.  If you have a fever 102 and up and significantly worsening symptoms, this could indicate a more serious infection moving to the middle/inner and needs face to face evaluation in an office by a provider.  Your symptoms should improve over the next 3 days and should resolve in about 7 days.  HOME CARE:   Wash your hands frequently.  Do not place the tip of the bottle on your ear or touch it with your fingers.  You can take Acetominophen 650 mg every 4-6 hours as needed for pain.  If pain is severe or moderate, you can apply a heating pad (set on low) or hot water bottle (wrapped in a towel) to outer ear for 20 minutes.  This will also increase drainage.  Avoid ear plugs  Do not use Q-tips  After showers, help the water run out by tilting your head to one side.  GET HELP RIGHT AWAY IF:   Fever is over 102.2 degrees.  You develop progressive ear pain or hearing loss.  Ear symptoms persist longer than 3 days after treatment.  MAKE SURE YOU:   Understand these instructions.  Will watch your condition.  Will get help right away if you are not doing well or get worse.  TO PREVENT SWIMMER'S EAR:  Use a bathing cap or custom fitted swim molds to keep your ears dry.  Towel off after swimming to dry your ears.  Tilt your head or pull your earlobes to allow the water to escape your ear canal.  If there is still water in your ears, consider using  a hairdryer on the lowest setting.  Thank you for choosing an e-visit. Your e-visit answers were reviewed by a board certified advanced clinical practitioner to complete your personal care plan. Depending upon the condition, your plan could have included both over the counter or prescription medications. Please review your pharmacy choice. Be sure that the pharmacy you have chosen is open so that you can pick up your prescription now.  If there is a problem you may message your provider in [place] to have the prescription routed to another pharmacy. Your safety is important to [place]. If you have drug allergies check your prescription carefully.  For the next 24 hours, you can use MyChart to ask questions about today's visit, request a non-urgent call back, or ask for a work or school excuse from your e-visit provider. You will get an email in the next two days asking about your experience. I hope that your e-visit has been valuable and will speed your recovery.      Greater than 5 minutes, yet less than 10 minutes of time have been spent researching, coordinating, and implementing care for this patient today

## 2020-04-30 ENCOUNTER — Other Ambulatory Visit (HOSPITAL_COMMUNITY): Payer: Self-pay | Admitting: Family Medicine

## 2020-04-30 MED FILL — CLOBETASOL PROPIONATE 0.05: 0.05 | 30 days supply | Qty: 45 | Fill #0

## 2020-05-01 MED FILL — FLUTICASONE PROP 50 MCG SPR: 50 | 30 days supply | Qty: 16 | Fill #0

## 2020-05-02 ENCOUNTER — Other Ambulatory Visit: Payer: Self-pay | Admitting: Family Medicine

## 2020-05-02 DIAGNOSIS — Z0000 Encounter for general adult medical examination without abnormal findings: Secondary | ICD-10-CM

## 2020-05-14 ENCOUNTER — Ambulatory Visit: Payer: 59 | Admitting: Family Medicine

## 2020-05-15 ENCOUNTER — Ambulatory Visit: Payer: 59 | Admitting: Family Medicine

## 2020-05-15 ENCOUNTER — Other Ambulatory Visit: Payer: Self-pay

## 2020-05-15 ENCOUNTER — Ambulatory Visit
Admission: RE | Admit: 2020-05-15 | Discharge: 2020-05-15 | Disposition: A | Discharge status: Home / Self Care | Payer: 59 | Source: Ambulatory Visit | Attending: Family Medicine | Admitting: Family Medicine

## 2020-05-15 DIAGNOSIS — Z0000 Encounter for general adult medical examination without abnormal findings: Secondary | ICD-10-CM

## 2020-05-15 DIAGNOSIS — Z1231 Encounter for screening mammogram for malignant neoplasm of breast: Secondary | ICD-10-CM | POA: Diagnosis not present

## 2020-05-21 ENCOUNTER — Encounter: Payer: Self-pay | Admitting: Family Medicine

## 2020-05-21 ENCOUNTER — Other Ambulatory Visit: Payer: Self-pay | Admitting: Family Medicine

## 2020-05-21 ENCOUNTER — Ambulatory Visit (INDEPENDENT_AMBULATORY_CARE_PROVIDER_SITE_OTHER): Payer: 59

## 2020-05-21 ENCOUNTER — Telehealth: Payer: Self-pay | Admitting: Family Medicine

## 2020-05-21 ENCOUNTER — Other Ambulatory Visit: Payer: Self-pay

## 2020-05-21 ENCOUNTER — Ambulatory Visit: Payer: Self-pay

## 2020-05-21 ENCOUNTER — Ambulatory Visit (INDEPENDENT_AMBULATORY_CARE_PROVIDER_SITE_OTHER): Payer: 59 | Admitting: Family Medicine

## 2020-05-21 VITALS — BP 140/88 | HR 87 | Ht 66.0 in | Wt 264.0 lb

## 2020-05-21 DIAGNOSIS — M25511 Pain in right shoulder: Secondary | ICD-10-CM

## 2020-05-21 MED ORDER — KETOROLAC TROMETHAMINE 30 MG/ML IJ SOLN
30.0000 mg | Freq: Once | INTRAMUSCULAR | Status: AC
  Administered 2020-05-21: 30 mg via INTRAMUSCULAR

## 2020-05-21 MED ORDER — TRAMADOL HCL 50 MG PO TABS: 50.0000 mg | ORAL_TABLET | Freq: Three times a day (TID) | ORAL | 0 refills | Status: DC | PRN

## 2020-05-21 MED FILL — PANTOPRAZOLE SOD DR 40 MG T: 40 | 90 days supply | Qty: 90 | Fill #1

## 2020-05-21 MED FILL — traMADol HCL 50 MG TABS: 50 | 5 days supply | Qty: 15 | Fill #0

## 2020-05-21 MED FILL — HYDROCHLOROTHIAZIDE 25 MG T: 25 | 90 days supply | Qty: 90 | Fill #1

## 2020-05-21 NOTE — Telephone Encounter (Signed)
Tramadol sent to [facility] outpatient pharmacy

## 2020-05-21 NOTE — Telephone Encounter (Signed)
Patient called asking if something was to be sent in for her to help with pain? If so, she would like it sent to the [facility].

## 2020-05-21 NOTE — Progress Notes (Signed)
fo        I, Wendy Poet, LAT, ATC, am serving as scribe for Dr. Lynne Leader.  Barbara Reese is a 54 y.o. female who presents to [place] at [facility] today for R shoulder pain.  She was last seen by Dr. Tamala Julian on 06/06/19 for R knee pain.  Since her last visit w/ Dr. Tamala Julian, she reports R shoulder pain x approximately one month.  She locates her pain to her L ant and sup shoulder.  Pain has worsened recently become quite severe.  Radiating pain: no R shoulder mechanical symptoms: yes Aggravating factors: sleeping; R shoulder active flexion above 60 deg;  Treatments tried: IBU, Tramadol, ice, heat   Pertinent review of systems: No fevers or chills  Relevant historical information: Hypertension sleep apnea diabetes   Exam:  BP 140/88 (BP Location: Left Arm, Patient Position: Sitting, Cuff Size: Large)   Pulse 87   Ht 5\' 6"  (1.676 m)   Wt 264 lb (119.7 kg)   LMP 02/09/2016 (Exact Date)   SpO2 97%   BMI 42.61 kg/m  General: Well Developed, well nourished, and in no acute distress.   MSK: Right shoulder normal-appearing not particular tender palpation. Range of motion abduction 90 degrees external rotation 20 degrees by neutral position internal rotation to posterior iliac crest. Strength intact within limits of motion. Negative Yergason's and speeds test. Unable to get the correct positioning for Hawkins or Neer's testing. Pulses cap refill and sensation are intact distally.    Lab and Radiology Results  X-ray images right shoulder obtained today personally and independently reviewed Significant AC DJD.  No severe glenohumeral DJD.  No fractures visible. Await formal radiology review  Procedure: Real-time Ultrasound Guided Injection of right shoulder subacromial bursa Device: Philips Affiniti 50G Images permanently stored and available for review in PACS Ultrasound examination of right shoulder prior to injection reveals calcific tendinopathy at distal  supraspinatus tendon and subacromial bursitis present.  No full-thickness retracted rotator cuff tear visible. Verbal informed consent obtained.  Discussed risks and benefits of procedure. Warned about infection bleeding damage to structures skin hypopigmentation and fat atrophy among others. Patient expresses understanding and agreement Time-out conducted.   Noted no overlying erythema, induration, or other signs of local infection.   Skin prepped in a sterile fashion.   Local anesthesia: Topical Ethyl chloride.   With sterile technique and under real time ultrasound guidance:  40 mg of Kenalog and 2 mL of Marcaine injected into bursa sac. Fluid seen entering the bursa.   Completed without difficulty   Pain partially  resolved suggesting accurate placement of the medication.   Advised to call if fevers/chills, erythema, induration, drainage, or persistent bleeding.   Images permanently stored and available for review in the ultrasound unit.  Impression: Technically successful ultrasound guided injection.      Assessment and Plan: 54 y.o. female with right shoulder pain ongoing for about a month worsening recently.  Patient does have some rotator cuff changes on ultrasound but did not have much benefit to subacromial injection.  That coupled with the lack of motion is concerning for adhesive capsulitis.  Plan for physical therapy and Toradol injection in clinic today.  We will be happy to refill tramadol if needed.  Recheck in a month.   PDMP not reviewed this encounter. Orders Placed This Encounter  Procedures  . [place] LIMITED JOINT SPACE STRUCTURES UP RIGHT(NO LINKED CHARGES)    Order Specific Question:   Reason for Exam (SYMPTOM  OR DIAGNOSIS REQUIRED)    Answer:   R shoulder pain    Order Specific Question:   Preferred imaging location?    Answer:   [place]  . DG Shoulder Right    Standing Status:   Future    Number of Occurrences:   1    Standing  Expiration Date:   06/20/2020    Order Specific Question:   Reason for Exam (SYMPTOM  OR DIAGNOSIS REQUIRED)    Answer:   R shoulder pain    Order Specific Question:   Is patient pregnant?    Answer:   No    Order Specific Question:   Preferred imaging location?    Answer:   [facility]  . Ambulatory referral to Physical Therapy    Referral Priority:   Routine    Referral Type:   Physical Medicine    Referral Reason:   Specialty Services Required    Requested Specialty:   Physical Therapy   Meds ordered this encounter  Medications  . ketorolac (TORADOL) 30 MG/ML injection 30 mg     Discussed warning signs or symptoms. Please see discharge instructions. Patient expresses understanding.   The above documentation has been reviewed and is accurate and complete Lynne Leader, M.D.

## 2020-05-21 NOTE — Patient Instructions (Addendum)
Thank you for coming in today.  Please get an Xray today before you leave  I've referred you to Physical Therapy.  Let us know if you don't hear from them in one week.  Recheck in 4 weeks.    Adhesive Capsulitis  Adhesive capsulitis, also called frozen shoulder, causes the shoulder to become stiff and painful to move. This condition happens when there is inflammation of the tendons and ligaments that surround the shoulder joint (shoulder capsule). What are the causes? This condition may be caused by:  An injury to your shoulder joint.  Straining your shoulder.  Not moving your shoulder for a period of time. This can happen if your arm was injured or in a sling.  Long-standing conditions, such as: ? Diabetes. ? Thyroid problems. ? Heart disease. ? Stroke. ? Rheumatoid arthritis. ? Lung disease. In some cases, the cause is not known. What increases the risk? You are more likely to develop this condition if you are:  A woman.  Older than 54 years of age. What are the signs or symptoms? Symptoms of this condition include:  Pain in your shoulder when you move your arm. There may also be pain when parts of your shoulder are touched. The pain may be worse at night or when you are resting.  A sore or aching shoulder.  The inability to move your shoulder normally.  Muscle spasms. How is this diagnosed? This condition is diagnosed with a physical exam and imaging tests, such as an X-ray or MRI. How is this treated? This condition may be treated with:  Treatment of the underlying cause or condition.  Medicine. Medicine may be given to relieve pain, inflammation, or muscle spasms.  Steroid injections into the shoulder joint.  Physical therapy. This involves performing exercises to get the shoulder moving again.  Acupuncture. This is a type of treatment that involves stimulating specific points on your body by inserting thin needles through your skin.  Shoulder  manipulation. This is a procedure to move the shoulder into another position. It is done after you are given a medicine to make you fall asleep (general anesthetic). The joint may also be injected with salt water at high pressure to break down scarring.  Surgery. This may be done in severe cases when other treatments have failed. Although most people recover completely from adhesive capsulitis, some may not regain full shoulder movement. Follow these instructions at home: Managing pain, stiffness, and swelling      If directed, put ice on the injured area: ? Put ice in a plastic bag. ? Place a towel between your skin and the bag. ? Leave the ice on for 20 minutes, 2-3 times per day.  If directed, apply heat to the affected area before you exercise. Use the heat source that your health care provider recommends, such as a moist heat pack or a heating pad. ? Place a towel between your skin and the heat source. ? Leave the heat on for 20-30 minutes. ? Remove the heat if your skin turns bright red. This is especially important if you are unable to feel pain, heat, or cold. You may have a greater risk of getting burned. General instructions  Take over-the-counter and prescription medicines only as told by your health care provider.  If you are being treated with physical therapy, follow instructions from your physical therapist.  Avoid exercises that put a lot of demand on your shoulder, such as throwing. These exercises can make pain worse.  Keep all follow-up visits as told by your health care provider. This is important. Contact a health care provider if:  You develop new symptoms.  Your symptoms get worse. Summary  Adhesive capsulitis, also called frozen shoulder, causes the shoulder to become stiff and painful to move.  You are more likely to have this condition if you are a woman and over age 86.  It is treated with physical therapy, medicines, and sometimes surgery. This  information is not intended to replace advice given to you by your health care provider. Make sure you discuss any questions you have with your health care provider. Document Revised: 01/14/2018 Document Reviewed: 01/14/2018 Elsevier Patient Education  [place].

## 2020-05-22 NOTE — Progress Notes (Signed)
X-ray shows some arthritis of the small joint of the top of the shoulder otherwise looks pretty normal.

## 2020-06-18 ENCOUNTER — Ambulatory Visit: Payer: 59 | Admitting: Family Medicine

## 2020-07-02 ENCOUNTER — Ambulatory Visit: Payer: 59 | Admitting: Family Medicine

## 2020-07-25 DIAGNOSIS — K5730 Diverticulosis of large intestine without perforation or abscess without bleeding: Secondary | ICD-10-CM | POA: Diagnosis not present

## 2020-07-25 DIAGNOSIS — K219 Gastro-esophageal reflux disease without esophagitis: Secondary | ICD-10-CM | POA: Diagnosis not present

## 2020-07-25 DIAGNOSIS — Z1211 Encounter for screening for malignant neoplasm of colon: Secondary | ICD-10-CM | POA: Diagnosis not present

## 2020-07-25 DIAGNOSIS — R140 Abdominal distension (gaseous): Secondary | ICD-10-CM | POA: Diagnosis not present

## 2020-07-31 ENCOUNTER — Encounter: Payer: Self-pay | Admitting: Family Medicine

## 2020-08-21 MED FILL — PANTOPRAZOLE SOD DR 40 MG T: 40 | 90 days supply | Qty: 90 | Fill #2

## 2020-08-21 MED FILL — HYDROCHLOROTHIAZIDE 25 MG T: 25 | 90 days supply | Qty: 90 | Fill #2

## 2020-08-21 NOTE — Progress Notes (Deleted)
  Date of Visit: 08/22/2020   SUBJECTIVE:   HPI:  Barbara Reese presents today for routine follow up.  Diabetes - currently taking   OBJECTIVE:   LMP 02/09/2016 (Exact Date)  Gen: *** HEENT: *** Heart: *** Lungs: *** Neuro: *** Ext: ***  ASSESSMENT/PLAN:   Health maintenance:  -***  No problem-specific Assessment & Plan notes found for this encounter.  FOLLOW UP: Follow up in *** for ***  [place] J. Pollie Meyer, MD [facility]

## 2020-08-22 ENCOUNTER — Ambulatory Visit: Payer: 59 | Admitting: Family Medicine

## 2020-08-27 MED FILL — FLUTICASONE PROP 50 MCG SPR: 50 | 30 days supply | Qty: 16 | Fill #1

## 2020-09-02 ENCOUNTER — Other Ambulatory Visit (HOSPITAL_COMMUNITY): Payer: Self-pay | Admitting: Gastroenterology

## 2020-09-02 MED FILL — CLENPIQ 10-3.5-12 MG-GM -GM: 10-3.5-12 M | 1 days supply | Qty: 320 | Fill #0

## 2020-09-17 ENCOUNTER — Other Ambulatory Visit (HOSPITAL_COMMUNITY): Payer: Self-pay | Admitting: Family Medicine

## 2020-09-17 ENCOUNTER — Encounter: Payer: Self-pay | Admitting: Family Medicine

## 2020-09-17 ENCOUNTER — Other Ambulatory Visit: Payer: Self-pay

## 2020-09-17 ENCOUNTER — Ambulatory Visit (INDEPENDENT_AMBULATORY_CARE_PROVIDER_SITE_OTHER): Payer: 59 | Admitting: Family Medicine

## 2020-09-17 VITALS — BP 128/84 | HR 75 | Wt 257.8 lb

## 2020-09-17 DIAGNOSIS — M170 Bilateral primary osteoarthritis of knee: Secondary | ICD-10-CM | POA: Diagnosis not present

## 2020-09-17 DIAGNOSIS — Z6841 Body Mass Index (BMI) 40.0 and over, adult: Secondary | ICD-10-CM | POA: Diagnosis not present

## 2020-09-17 DIAGNOSIS — E119 Type 2 diabetes mellitus without complications: Secondary | ICD-10-CM | POA: Diagnosis not present

## 2020-09-17 DIAGNOSIS — E785 Hyperlipidemia, unspecified: Secondary | ICD-10-CM | POA: Insufficient documentation

## 2020-09-17 DIAGNOSIS — Z23 Encounter for immunization: Secondary | ICD-10-CM

## 2020-09-17 DIAGNOSIS — I10 Essential (primary) hypertension: Secondary | ICD-10-CM | POA: Diagnosis not present

## 2020-09-17 DIAGNOSIS — G2581 Restless legs syndrome: Secondary | ICD-10-CM | POA: Diagnosis not present

## 2020-09-17 DIAGNOSIS — E559 Vitamin D deficiency, unspecified: Secondary | ICD-10-CM | POA: Insufficient documentation

## 2020-09-17 LAB — POCT GLYCOSYLATED HEMOGLOBIN (HGB A1C): Hemoglobin A1C: 6.7 % — AB (ref 4.0–5.6)

## 2020-09-17 MED ORDER — TRAMADOL HCL 50 MG PO TABS
50.0000 mg | ORAL_TABLET | Freq: Three times a day (TID) | ORAL | 1 refills | Status: DC | PRN
Start: 2020-09-17 — End: 2021-05-13

## 2020-09-17 MED FILL — traMADol HCL 50 MG TABS: 50 | 10 days supply | Qty: 30 | Fill #0

## 2020-09-17 NOTE — Assessment & Plan Note (Signed)
Encouraged efforts at weight loss 

## 2020-09-17 NOTE — Progress Notes (Signed)
  Date of Visit: 09/17/2020   SUBJECTIVE:   HPI:  Barbara Reese presents today for routine follow up.  Diabetes - not on any diabetes medications right now. A1c today 6.7.  Hyperlipidemia - taking atorvastatin 10mg  daily, tolerating well. No issues with this.  Hypertension - taking HCTZ 25mg  daily, again tolerating well.  Osteoarthritis - needs refill of tramadol. Uses only when needed, not if pain is not bothering her. Has bothered her more lately possibly due to weather. Has appointment to see Dr. Tamala Julian (sports medicine)  Nighttime fidgets - kicks her left leg a lot at night, also just moves around a lot. Doesn't happen during the day, just at night.   OBJECTIVE:   BP 128/84   Pulse 75   Wt 257 lb 12.8 oz (116.9 kg)   LMP 02/09/2016 (Exact Date)   SpO2 97%   BMI 41.61 kg/m  Gen: no acute distress, pleasant cooperative HEENT: normocephalic, atraumatic  Heart: regular rate and rhythm, no murmur Lungs: clear to auscultation bilaterally, normal work of breathing  Neuro: alert, speech normal, grossly nonfocal Abdomen: soft, nontender to palpation, no masses, no hernias appreciated Ext: No appreciable lower extremity edema bilaterally  Diabetic foot exam: 2+ DP pulses bilat, normal monofilament testing bilaterally. No lesions or significant calluses.   ASSESSMENT/PLAN:   Health maintenance:  -normal foot exam today -has eye appointment on Feb 3 with [facility] at [place] -has appointment for colonoscopy with Dr. Collene Mares on Feb 22 (had to reschedule this recently) -advised due for Tdap, patient did not want this today, RN visit scheduled for this on 2/9 -urine microalbumin collected today -COVID booster given today  Arthritis of knee, degenerative Refill tramadol for as needed use. Has follow up in place with sports medicine  Hypertension Well controlled. Continue current medication regimen.   OBESITY, MODERATE Encouraged efforts at weight loss   Type 2 diabetes mellitus  ([place]) Remains well controlled with lifestyle changes alone  Hyperlipidemia Check direct LDL at upcoming lab appointment   Fideting at night - possible restless leg syndrome. Will check iron studies at upcoming lab visit. Consider adding ropinirole trial, but will wait for labs first.  FOLLOW UP: Follow up in 6 mos for above issues  [place] J. [name], [place]

## 2020-09-17 NOTE — Patient Instructions (Signed)
It was great to see you again today!  Testing urine today Come back 2/9 for tetanus shot and labs Booster shot today Refilled tramadol  Follow up with me in 6 months, sooner if needed  Be well, Dr. [name]

## 2020-09-17 NOTE — Assessment & Plan Note (Signed)
Check direct LDL at upcoming lab appointment

## 2020-09-17 NOTE — Assessment & Plan Note (Signed)
Refill tramadol for as needed use. Has follow up in place with sports medicine

## 2020-09-17 NOTE — Assessment & Plan Note (Signed)
Remains well controlled with lifestyle changes alone

## 2020-09-17 NOTE — Progress Notes (Signed)
   Covid-19 Vaccination Clinic  Name:  LIBRADA CASTRONOVO    MRN: [id number] DOB: 09/24/1965  09/17/2020  Ms. Rutan was observed post Covid-19 immunization for 15 minutes without incident. She was provided with Vaccine Information Sheet and instruction to access the V-Safe system.   Ms. Windholz was instructed to call 911 with any severe reactions post vaccine: Marland Kitchen Difficulty breathing  . Swelling of face and throat  . A fast heartbeat  . A bad rash all over body  . Dizziness and weakness

## 2020-09-17 NOTE — Assessment & Plan Note (Signed)
Update vit D level at next lab visit

## 2020-09-17 NOTE — Assessment & Plan Note (Signed)
Well controlled. Continue current medication regimen.  

## 2020-09-18 ENCOUNTER — Ambulatory Visit: Payer: 59 | Admitting: Family Medicine

## 2020-09-18 LAB — MICROALBUMIN / CREATININE URINE RATIO
Creatinine, Urine: 299.7 mg/dL
Microalb/Creat Ratio: 7 mg/g creat (ref 0–29)
Microalbumin, Urine: 21.3 ug/mL

## 2020-09-19 ENCOUNTER — Ambulatory Visit: Payer: 59 | Admitting: Family Medicine

## 2020-09-26 DIAGNOSIS — K644 Residual hemorrhoidal skin tags: Secondary | ICD-10-CM | POA: Diagnosis not present

## 2020-09-26 DIAGNOSIS — K5730 Diverticulosis of large intestine without perforation or abscess without bleeding: Secondary | ICD-10-CM | POA: Diagnosis not present

## 2020-09-26 DIAGNOSIS — K219 Gastro-esophageal reflux disease without esophagitis: Secondary | ICD-10-CM | POA: Diagnosis not present

## 2020-10-02 ENCOUNTER — Other Ambulatory Visit: Payer: 59

## 2020-10-02 ENCOUNTER — Ambulatory Visit (INDEPENDENT_AMBULATORY_CARE_PROVIDER_SITE_OTHER): Payer: 59

## 2020-10-02 ENCOUNTER — Other Ambulatory Visit: Payer: Self-pay

## 2020-10-02 DIAGNOSIS — Z23 Encounter for immunization: Secondary | ICD-10-CM

## 2020-10-02 DIAGNOSIS — E559 Vitamin D deficiency, unspecified: Secondary | ICD-10-CM

## 2020-10-02 DIAGNOSIS — E785 Hyperlipidemia, unspecified: Secondary | ICD-10-CM | POA: Diagnosis not present

## 2020-10-02 DIAGNOSIS — G2581 Restless legs syndrome: Secondary | ICD-10-CM

## 2020-10-02 MED FILL — CLENPIQ 10-3.5-12 MG-GM -GM: 10-3.5-12 M | 1 days supply | Qty: 320 | Fill #0

## 2020-10-02 NOTE — Progress Notes (Signed)
Patient presents to nurse clinic for Tdap vaccination. Administered in RD, site unremarkable, tolerated injection well.   Talbot Grumbling, RN

## 2020-10-03 ENCOUNTER — Encounter: Payer: Self-pay | Admitting: Family Medicine

## 2020-10-03 LAB — FERRITIN: Ferritin: 170 ng/mL — ABNORMAL HIGH (ref 15–150)

## 2020-10-03 LAB — IRON AND TIBC
Iron Saturation: 19 % (ref 15–55)
Iron: 48 ug/dL (ref 27–159)
Total Iron Binding Capacity: 257 ug/dL (ref 250–450)
UIBC: 209 ug/dL (ref 131–425)

## 2020-10-03 LAB — VITAMIN D 25 HYDROXY (VIT D DEFICIENCY, FRACTURES): Vit D, 25-Hydroxy: 23.2 ng/mL — ABNORMAL LOW (ref 30.0–100.0)

## 2020-10-03 LAB — LDL CHOLESTEROL, DIRECT: LDL Direct: 130 mg/dL — ABNORMAL HIGH (ref 0–99)

## 2020-10-07 ENCOUNTER — Telehealth: Payer: Self-pay | Admitting: Family Medicine

## 2020-10-07 NOTE — Telephone Encounter (Signed)
Patient is calling and would like for Dr. [name] to call her to discuss the results sent to her mychart.   The best call back is [phone]

## 2020-10-08 ENCOUNTER — Telehealth: Payer: Self-pay | Admitting: Family Medicine

## 2020-10-08 ENCOUNTER — Other Ambulatory Visit (HOSPITAL_COMMUNITY): Payer: Self-pay | Admitting: Family Medicine

## 2020-10-08 MED ORDER — ROPINIROLE HCL 0.25 MG PO TABS: 0.2500 mg | ORAL_TABLET | Freq: Every day | ORAL | 1 refills | Status: DC

## 2020-10-08 MED ORDER — CHOLECALCIFEROL 1.25 MG (50000 UT) PO TABS: 1.0000 | ORAL_TABLET | ORAL | 0 refills | Status: DC

## 2020-10-08 MED FILL — VIT D3-50 50,000 UNITS CAPS: 1.25 MG | 84 days supply | Qty: 12 | Fill #0

## 2020-10-08 MED FILL — rOPINIRole HCL 0.25 MG TABS: 0.25 | 30 days supply | Qty: 30 | Fill #0

## 2020-10-08 NOTE — Telephone Encounter (Signed)
Patient is calling stating that she has been calling for the past couple of days to get a call from doctor [name] to discuss test results. Patient seems upset about not getting a response I told her I am sorry that the message was sent to doctor yesterday. Please advise. Thanks!

## 2020-10-08 NOTE — Telephone Encounter (Signed)
See separate phone note. Sai Zinn J Marsean Elkhatib, MD  

## 2020-10-08 NOTE — Telephone Encounter (Signed)
Called patient and reviewed labs LDL above goal on atorv 10mg  daily Patient hesitant to increase dose, prefers to do lifestyle modification Will plan to recheck in 3 months  Vit D low - start weekly repletion 50k units x12 weeks, recheck after  Iron studies unremarkable. Start trial of ropinirole for RLS  Patient appreciative [name], MD

## 2020-10-16 DIAGNOSIS — Z1211 Encounter for screening for malignant neoplasm of colon: Secondary | ICD-10-CM | POA: Diagnosis not present

## 2020-10-16 DIAGNOSIS — K5730 Diverticulosis of large intestine without perforation or abscess without bleeding: Secondary | ICD-10-CM | POA: Diagnosis not present

## 2020-10-18 ENCOUNTER — Encounter: Payer: Self-pay | Admitting: Family Medicine

## 2020-10-18 ENCOUNTER — Other Ambulatory Visit: Payer: Self-pay

## 2020-10-18 DIAGNOSIS — Z90710 Acquired absence of both cervix and uterus: Secondary | ICD-10-CM

## 2020-10-18 HISTORY — DX: Acquired absence of both cervix and uterus: Z90.710

## 2020-10-22 MED FILL — VIT D3-50 50,000 UNITS CAPS: 1.25 MG | 84 days supply | Qty: 12 | Fill #0

## 2020-10-22 MED FILL — rOPINIRole HCL 0.25 MG TABS: 0.25 | 30 days supply | Qty: 30 | Fill #0

## 2020-11-15 MED FILL — FLUTICASONE PROP 50 MCG SPR: 50 | 30 days supply | Qty: 16 | Fill #2

## 2020-11-15 MED FILL — PANTOPRAZOLE SOD DR 40 MG T: 40 | 90 days supply | Qty: 90 | Fill #3

## 2020-11-15 MED FILL — HYDROCHLOROTHIAZIDE 25 MG T: 25 | 90 days supply | Qty: 90 | Fill #3

## 2020-12-27 MED FILL — Tramadol HCl Tab 50 MG: ORAL | 10 days supply | Qty: 30 | Fill #0 | Status: AC

## 2020-12-28 ENCOUNTER — Other Ambulatory Visit (HOSPITAL_COMMUNITY): Payer: Self-pay

## 2020-12-30 ENCOUNTER — Other Ambulatory Visit (HOSPITAL_COMMUNITY): Payer: Self-pay

## 2021-01-01 ENCOUNTER — Ambulatory Visit: Payer: 59 | Admitting: Family Medicine

## 2021-01-02 ENCOUNTER — Other Ambulatory Visit (HOSPITAL_COMMUNITY): Payer: Self-pay

## 2021-01-08 ENCOUNTER — Telehealth: Payer: Self-pay | Admitting: Family Medicine

## 2021-01-08 NOTE — Telephone Encounter (Signed)
Received faxed FMLA papers for renewal Forms completed, will return to [facility] RN team [name], MD

## 2021-01-09 NOTE — Telephone Encounter (Signed)
Faxed paperwork to [employer] at [phone]  Copy made and placed in batch scanning.   Talbot Grumbling, RN

## 2021-02-04 ENCOUNTER — Telehealth: Payer: Self-pay

## 2021-02-04 NOTE — Telephone Encounter (Signed)
Patient calls nurse line stating she took an at home test and positive. Patient reports she started having a scratchy throat last night which promoted her to test. Patient denies fever, body aches, cough, congestion, or SOB. Patient instructed to contact health at work. Conservative measures given. Patient would like to know if she is a candidate for any antiviral treatment. Weill forward to PCP.   Please send to [facility] cone outpatient if appropriate.

## 2021-02-05 NOTE — Telephone Encounter (Signed)
Sick for 2 days tested home positive yesterday Fully vaccinated Diet controlled diabetes and well controlled hypertension No immunosuprresive medications or illnesses  No a candidate for Paxlovid - risk outweighs low benefits Dicussed symptomatic are including nasal decongestants for 3 days  To call if feeling worse or shortness of breath

## 2021-02-07 ENCOUNTER — Other Ambulatory Visit (HOSPITAL_COMMUNITY): Payer: Self-pay

## 2021-02-07 ENCOUNTER — Telehealth: Payer: 59 | Admitting: Emergency Medicine

## 2021-02-07 DIAGNOSIS — U071 COVID-19: Secondary | ICD-10-CM

## 2021-02-07 MED ORDER — IPRATROPIUM BROMIDE 0.03 % NA SOLN
2.0000 | Freq: Two times a day (BID) | NASAL | 0 refills | Status: DC
  Filled 2021-02-07: qty 30, 43d supply, fill #0

## 2021-02-07 MED ORDER — SALINE SPRAY 0.65 % NA SOLN
1.0000 | NASAL | 0 refills | Status: DC | PRN
  Filled 2021-02-07: qty 44, 30d supply, fill #0

## 2021-02-07 MED ORDER — MOLNUPIRAVIR EUA 200MG CAPSULE
4.0000 | ORAL_CAPSULE | Freq: Two times a day (BID) | ORAL | 0 refills | Status: AC
  Filled 2021-02-07: qty 40, 5d supply, fill #0

## 2021-02-07 MED ORDER — BENZONATATE 100 MG PO CAPS
100.0000 mg | ORAL_CAPSULE | Freq: Two times a day (BID) | ORAL | 0 refills | Status: DC | PRN
  Filled 2021-02-07: qty 20, 10d supply, fill #0

## 2021-02-07 NOTE — Progress Notes (Signed)
I recommend that you make schedule a video visit with [place] or be seen at the urgent care.  It looks like you'd benefit from an antiviral for COVID treatment, but this can't be prescribed through an e-visit.   NOTE: If you entered your credit card information for this eVisit, you will not be charged. You may see a "hold" on your card for the $35 but that hold will drop off and you will not have a charge processed.   If you are having a true medical emergency please call 911.      For an urgent face to face visit, [place] has six urgent care centers for your convenience:     [facility] [place] [facility] [place] Get Driving Directions [phone] [address] [place], [place] [address] 8 am - 4 pm Monday - Friday    [facility]) Get Driving Directions [phone] [place],  [address] 8 am to 8 pm Monday-Friday 10 am to 6 pm [facility] [place] ([place]) Get Driving Directions [phone]  [address] [place],    [address] 8 am to 8 pm Monday-Friday 8 am to 4 pm [facility] at [facility] Get Driving Directions [phone] [place], [place],  [address] 8 am to 8 pm Monday-Friday 8 am to 4 pm [facility] at [facility] Get Driving Directions  [phone] [address] [place], [place] [address] 8 am to 8 pm Monday-Friday 8 am to 4 pm [facility] Urgent Care at [place] Get Driving Directions [phone] [address] [place], [place] [address] 8 am to 8 pm Monday-Friday 8 am to 4 pm Saturday-Sunday     Your MyChart E-visit questionnaire answers were reviewed by a board certified advanced clinical practitioner to complete your personal care plan based on your specific symptoms.  Thank  you for using e-Visits.   Approximately 5 minutes was used in reviewing the patient's chart, questionnaire, prescribing medications, and documentation.

## 2021-02-07 NOTE — Progress Notes (Signed)
Virtual Visit Consent   Barbara Reese, you are scheduled for a virtual visit with a [facility] provider today.     Just as with appointments in the office, your consent must be obtained to participate.  Your consent will be active for this visit and any virtual visit you may have with one of our providers in the next 365 days.     If you have a MyChart account, a copy of this consent can be sent to you electronically.  All virtual visits are billed to your insurance company just like a traditional visit in the office.    As this is a virtual visit, video technology does not allow for your provider to perform a traditional examination.  This may limit your provider's ability to fully assess your condition.  If your provider identifies any concerns that need to be evaluated in person or the need to arrange testing (such as labs, EKG, etc.), we will make arrangements to do so.     Although advances in technology are sophisticated, we cannot ensure that it will always work on either your end or our end.  If the connection with a video visit is poor, the visit may have to be switched to a telephone visit.  With either a video or telephone visit, we are not always able to ensure that we have a secure connection.     I need to obtain your verbal consent now.   Are you willing to proceed with your visit today?    Barbara Reese has provided verbal consent on 02/07/2021 for a virtual visit video.   Barbara Circle, PA-C   Date: 02/07/2021 2:20 PM   Virtual Visit via Video Note   Barbara Reese, connected [email or web address]@ ([id number], 1966/05/08) on 02/07/21 at  2:30 PM EDT by a video-enabled telemedicine application and verified that I am speaking with the correct person using two identifiers.  Location: Patient: Virtual Visit Location Patient: Home Provider: Virtual Visit Location Provider: Home Office   I discussed the limitations of evaluation and management by telemedicine and the  availability of in person appointments. The patient expressed understanding and agreed to proceed.    History of Present Illness: Barbara Reese is a 55 y.o. who identifies as a female who was assigned female at birth, and is being seen today for with a chief complaint of COVID-19.  She tested positive 4 days ago.  Complains of worsening symptoms, especially congestion and coughing at night.  States OTC meds are not working.  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   Viral respiratory illness 11/02/2018   Acute bacterial middle ear infection, right 05/12/2018   Infective otitis externa of right ear 05/12/2018   Eustachian tube disorder 04/27/2018   Type 2 diabetes mellitus ([place]) 03/09/2018   Retrocalcaneal bursitis (back of heel), left 07/01/2017   Degenerative arthritis of left knee 03/13/2016   Lumbar radiculopathy 03/13/2016   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   Trigger thumb of right hand 01/02/2016   Situational anxiety 10/08/2014   Rectal bleeding 09/18/2014   Gout of big toe 05/23/2014   Derang of medial meniscus due to old tear/inj, unsp knee 05/17/2014   Arthritis of knee, degenerative 10/26/2013   Hypertension 12/24/2012   Physical examination of employee 12/01/2012  Arthritis 12/01/2012   Elevated LDL cholesterol level 12/16/2011   Prediabetes 12/16/2011   Depression 09/98/3382   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   ANXIETY DISORDER 11/06/2009   OBESITY, MODERATE 01/04/2009   OBSTRUCTIVE SLEEP APNEA 12/28/2008   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 For 12 weeks, Disp: 12 tablet, Rfl: 0   clobetasol cream (TEMOVATE) 0.05 %, APPLY TO THE AFFECTED AREA(S) TOPICALLY DAILY AS NEEDED., Disp: 45 g, Rfl: 0   cyclobenzaprine (FLEXERIL) 10 MG tablet, Take 1 tablet (10 mg total) by mouth 3 (three) times daily as needed for muscle spasms., Disp: 30 tablet, Rfl: 0   fluticasone (FLONASE) 50 MCG/ACT nasal spray, PLACE 2 SPRAYS INTO BOTH NOSTRILS DAILY AS NEEDED FOR ALLERGIES., Disp: 16 g, Rfl: 5   fluticasone (FLONASE) 50 MCG/ACT nasal spray, PLACE 2 SPRAYS INTO BOTH NOSTRILS DAILY AS NEEDED FOR ALLERGIES., Disp: 16 g, Rfl: 5   hydrochlorothiazide (HYDRODIURIL) 25 MG tablet, Take 1 tablet (25 mg total) by mouth daily., Disp: 90 tablet, Rfl: 3   hydrochlorothiazide (HYDRODIURIL) 25 MG tablet, TAKE 1 TABLET BY MOUTH ONCE DAILY., Disp: 90 tablet, Rfl: 3   ibuprofen (ADVIL,MOTRIN) 200 MG tablet, Take 600 mg by mouth 2 (two) times daily as needed for headache or moderate pain., Disp: , Rfl:    Multiple Vitamin (MULTIVITAMIN WITH MINERALS) TABS tablet, Take 1 tablet by mouth daily., Disp: , Rfl:    pantoprazole (PROTONIX) 40 MG tablet, Take 1 tablet (40 mg total) by mouth daily., Disp: 90 tablet, Rfl: 3   pantoprazole (PROTONIX) 40 MG tablet, TAKE 1 TABLET BY MOUTH ONCE DAILY., Disp: 90 tablet, Rfl: 3   rOPINIRole (REQUIP) 0.25 MG tablet, Take 1 tablet (0.25 mg total) by mouth at bedtime., Disp: 30 tablet, Rfl: 1   rOPINIRole (REQUIP) 0.25 MG tablet, TAKE 1 TABLET (0.25 MG TOTAL) BY MOUTH AT BEDTIME., Disp: 30 tablet, Rfl: 1   Sod Picosulfate-Mag Ox-Cit Acd 10-3.5-12 MG-GM -GM/160ML SOLN,  TAKE AS DIRECTED, Disp: 320 mL, Rfl: 0   traMADol (ULTRAM) 50 MG tablet, Take 1 tablet (50 mg total) by mouth every 8 (eight) hours as needed for severe pain., Disp: 30 tablet, Rfl: 1   traMADol (ULTRAM) 50 MG tablet, TAKE 1 TABLET (50 MG TOTAL) BY MOUTH EVERY 8 (EIGHT) HOURS AS NEEDED FOR SEVERE PAIN., Disp: 30 tablet, Rfl: 1  Observations/Objective: Patient is well-developed, well-nourished in no acute distress.  Resting comfortably at home.  Head is normocephalic, atraumatic.  No labored breathing. Sounds congested Speech is clear and coherent with logical content.  Patient is alert and oriented at baseline.    Assessment and Plan: 1. COVID-19 -Molnupiravir -Tessalon -Nasal saline and atrovent nasal sprays -PCP follow-up   Follow Up Instructions: I discussed the assessment and treatment plan with the patient. The patient was provided an opportunity to ask questions and  all were answered. The patient agreed with the plan and demonstrated an understanding of the instructions.  A copy of instructions were sent to the patient via MyChart.  The patient was advised to call back or seek an in-person evaluation if the symptoms worsen or if the condition fails to improve as anticipated.  Time:  I spent 18 minutes with the patient via telehealth technology discussing the above problems/concerns.    Barbara Circle, PA-C

## 2021-02-07 NOTE — Telephone Encounter (Signed)
Patient called nurse line this morning regarding continued issues with nasal congestion and stuffiness. Per chart review, patient had video visit with [facility] provider and was prescribed nasal sprays, cough medicine and molnupiravir.   Patient reports improvement with nasal congestion since starting nasal spray.   Return precautions given.   Advised patient that once recovered from [place], she is eligible for 4th COVID vaccine.   Talbot Grumbling, RN

## 2021-02-07 NOTE — Patient Instructions (Signed)
Please take medications as prescribed.  If your symptoms change or worsen, please contact your PCP.

## 2021-02-11 ENCOUNTER — Other Ambulatory Visit (HOSPITAL_COMMUNITY): Payer: Self-pay

## 2021-02-11 ENCOUNTER — Other Ambulatory Visit: Payer: Self-pay | Admitting: Family Medicine

## 2021-02-12 ENCOUNTER — Other Ambulatory Visit (HOSPITAL_COMMUNITY): Payer: Self-pay

## 2021-02-13 ENCOUNTER — Other Ambulatory Visit: Payer: Self-pay | Admitting: Family Medicine

## 2021-02-13 ENCOUNTER — Other Ambulatory Visit (HOSPITAL_COMMUNITY): Payer: Self-pay

## 2021-02-14 ENCOUNTER — Other Ambulatory Visit (HOSPITAL_COMMUNITY): Payer: Self-pay

## 2021-02-14 ENCOUNTER — Other Ambulatory Visit: Payer: Self-pay | Admitting: Family Medicine

## 2021-02-17 ENCOUNTER — Other Ambulatory Visit (HOSPITAL_COMMUNITY): Payer: Self-pay

## 2021-02-17 MED ORDER — HYDROCHLOROTHIAZIDE 25 MG PO TABS
25.0000 mg | ORAL_TABLET | Freq: Every day | ORAL | 3 refills | Status: DC
  Filled 2021-02-17: qty 90, 90d supply, fill #0
  Filled 2021-05-26: qty 90, 90d supply, fill #1
  Filled 2021-08-14: qty 90, 90d supply, fill #2

## 2021-02-17 MED ORDER — PANTOPRAZOLE SODIUM 40 MG PO TBEC
40.0000 mg | DELAYED_RELEASE_TABLET | Freq: Every day | ORAL | 3 refills | Status: DC
  Filled 2021-02-17: qty 90, 90d supply, fill #0
  Filled 2021-05-26: qty 90, 90d supply, fill #1
  Filled 2021-08-14: qty 90, 90d supply, fill #2
  Filled 2021-11-10: qty 90, 90d supply, fill #3

## 2021-02-18 ENCOUNTER — Ambulatory Visit: Payer: 59 | Admitting: Family Medicine

## 2021-02-18 ENCOUNTER — Ambulatory Visit: Payer: 59

## 2021-02-18 NOTE — Progress Notes (Deleted)
    SUBJECTIVE:   CHIEF COMPLAINT / HPI: "post covid, still coughing"    PERTINENT  PMH / PSH: ***  OBJECTIVE:   LMP 02/09/2016 (Exact Date)   ***  ASSESSMENT/PLAN:   No problem-specific Assessment & Plan notes found for this encounter.     Patriciaann Clan, [place]   {    This will disappear when note is signed, click to select method of visit    :1}

## 2021-02-20 ENCOUNTER — Other Ambulatory Visit (HOSPITAL_COMMUNITY): Payer: Self-pay

## 2021-02-20 MED FILL — Fluticasone Propionate Nasal Susp 50 MCG/ACT: NASAL | 30 days supply | Qty: 16 | Fill #0 | Status: AC

## 2021-03-25 ENCOUNTER — Ambulatory Visit: Payer: 59 | Admitting: Family Medicine

## 2021-04-01 ENCOUNTER — Other Ambulatory Visit: Payer: Self-pay | Admitting: Family Medicine

## 2021-04-01 DIAGNOSIS — Z1231 Encounter for screening mammogram for malignant neoplasm of breast: Secondary | ICD-10-CM

## 2021-04-22 ENCOUNTER — Ambulatory Visit: Payer: 59 | Admitting: Family Medicine

## 2021-04-30 ENCOUNTER — Other Ambulatory Visit: Payer: Self-pay | Admitting: Family Medicine

## 2021-04-30 ENCOUNTER — Other Ambulatory Visit (HOSPITAL_COMMUNITY): Payer: Self-pay

## 2021-04-30 MED ORDER — CLOBETASOL PROPIONATE 0.05 % EX CREA
TOPICAL_CREAM | CUTANEOUS | 0 refills | Status: DC
  Filled 2021-04-30: qty 45, 30d supply, fill #0

## 2021-04-30 MED FILL — Fluticasone Propionate Nasal Susp 50 MCG/ACT: NASAL | 30 days supply | Qty: 16 | Fill #1 | Status: AC

## 2021-05-13 ENCOUNTER — Encounter: Payer: Self-pay | Admitting: Family Medicine

## 2021-05-13 ENCOUNTER — Other Ambulatory Visit: Payer: Self-pay

## 2021-05-13 ENCOUNTER — Ambulatory Visit (INDEPENDENT_AMBULATORY_CARE_PROVIDER_SITE_OTHER): Payer: 59 | Admitting: Family Medicine

## 2021-05-13 ENCOUNTER — Other Ambulatory Visit (HOSPITAL_COMMUNITY): Payer: Self-pay

## 2021-05-13 VITALS — BP 146/90 | HR 99 | Wt 263.8 lb

## 2021-05-13 DIAGNOSIS — E785 Hyperlipidemia, unspecified: Secondary | ICD-10-CM

## 2021-05-13 DIAGNOSIS — L430 Hypertrophic lichen planus: Secondary | ICD-10-CM | POA: Diagnosis not present

## 2021-05-13 DIAGNOSIS — K219 Gastro-esophageal reflux disease without esophagitis: Secondary | ICD-10-CM

## 2021-05-13 DIAGNOSIS — J302 Other seasonal allergic rhinitis: Secondary | ICD-10-CM

## 2021-05-13 DIAGNOSIS — I10 Essential (primary) hypertension: Secondary | ICD-10-CM | POA: Diagnosis not present

## 2021-05-13 DIAGNOSIS — G4733 Obstructive sleep apnea (adult) (pediatric): Secondary | ICD-10-CM | POA: Diagnosis not present

## 2021-05-13 DIAGNOSIS — Z72820 Sleep deprivation: Secondary | ICD-10-CM | POA: Diagnosis not present

## 2021-05-13 DIAGNOSIS — M170 Bilateral primary osteoarthritis of knee: Secondary | ICD-10-CM | POA: Diagnosis not present

## 2021-05-13 DIAGNOSIS — E119 Type 2 diabetes mellitus without complications: Secondary | ICD-10-CM

## 2021-05-13 LAB — POCT GLYCOSYLATED HEMOGLOBIN (HGB A1C): HbA1c, POC (controlled diabetic range): 7 % (ref 0.0–7.0)

## 2021-05-13 MED ORDER — SHINGRIX 50 MCG/0.5ML IM SUSR: 0.5000 mL | Freq: Once | INTRAMUSCULAR | 1 refills | Status: AC

## 2021-05-13 MED ORDER — CLOBETASOL PROPIONATE 0.05 % EX CREA
TOPICAL_CREAM | CUTANEOUS | 0 refills | Status: DC
  Filled 2021-05-13: qty 45, 30d supply, fill #0

## 2021-05-13 MED ORDER — ROSUVASTATIN CALCIUM 5 MG PO TABS
5.0000 mg | ORAL_TABLET | Freq: Every day | ORAL | 3 refills | Status: DC
  Filled 2021-05-13 – 2021-05-26 (×2): qty 90, 90d supply, fill #0

## 2021-05-13 MED ORDER — TRAMADOL HCL 50 MG PO TABS
50.0000 mg | ORAL_TABLET | Freq: Three times a day (TID) | ORAL | 1 refills | Status: DC | PRN
  Filled 2021-05-13 – 2021-08-14 (×4): qty 30, 10d supply, fill #0
  Filled 2021-10-15: qty 30, 10d supply, fill #1

## 2021-05-13 NOTE — Progress Notes (Signed)
SUBJECTIVE:   CHIEF COMPLAINT / HPI:   Barbara Reese presents today for a follow-up visit.  Hypertension - Elevated in office today at 155/83; recheck was 146/90. On HCTZ 25 mg daily, well tolerated. She has been really stressed lately due to life events, and is concerned it is affecting her blood pressure. Wants to check it today and make sure it is under control. Has not been checking BP at home. Recently purchased new BP cuff.  Diabetes - Not currently on medications. HbA1C today was 7.0 which is up from 6.7 in January. She has been eating more sugary foods at night because she can't fall asleep, as well at work because there are snacks close to her office. She was aware her HbA1C would likely be increased from past visit. She previously has been able to lower HbA1C with lifestyle modifications. Would like to try that before considering metformin.   Sleep - Longstanding problem for Barbara Reese. Has tried melatonin and Dr. Lorrin Mais lotion daily with no effect. She usually needs benadryl, tramadol or other stronger medication to stay asleep. Does not have challenges going to sleep. She works second shift (ends at 11pm) so is not able to get to bed until 12pm earliest. Has history of OSA but has not been able to purchase a CPAP due to financial barriers.  Osteoarthritis - On tramadol 50 mg q8h as needed, well tolerated. Has been feeling increased pain on right knee which she attributes to weight gain since past visit.  Hyperlipidemia - Stopped taking atorvastatin 10 mg daily about 6 months ago due to muscle aches side effect. Agreeable to trying a different statin.  Lichen planus Requests refill of clobetasol, uses only as needed if skin areas very irritated  OBJECTIVE:   BP (!) 146/90   Pulse 99   Wt 263 lb 12.8 oz (119.7 kg)   LMP 02/09/2016 (Exact Date)   SpO2 99%   BMI 42.58 kg/m   GEN: Well appearing, in no acute distress, alert & oriented x4. CARD: RRR, no murmurs, gallops.  PULM: Clear  to auscultation bilaterally. Normal work of breathing. NEUR: Grossly normal. No concerns for motor strength or sensation. GI: Soft, non-tender PSYCH: Mood is "overwhelmed." Speech rate and volume normal. Thought content appropriate. Denies feelings of sadness or loss of interst in activities. Denies SI/HI. EXTREMITIES: No edema or cyanosis  ASSESSMENT/PLAN:   Hypertension Elevated in office today at 146/90. On hydrochlorothiazide 25 mg daily. Given presence of acute stressors and previously well controlled BP on same medication regimen, discussed monitoring BP at home before adjusting medications. Provided education about home BP checks and resting 5 minutes before measuring. Asked patient to reach out if BP is consistently above 140/90 threshold. Will consider adding amlodipine if BP consistently above 140/90 at home. Pt owns BP cuff.  OBSTRUCTIVE SLEEP APNEA Pt has been unable to find an affordable CPAP machine on her insurance. Has looked into discount stores numerous times.  Will reach out to case management to see if they can offer additional resources. Patient agreeable to CM consult.  Hyperlipidemia Stopped taking atorvastatin 10 mg daily about 6 months ago due to joint pain she experienced as side effect. LDL was elevated at 130 at last visit in January of 2020.  Prescribed rosuvastatin 5 mg daily.  Will recheck lipid levels in 6 months.  Type 2 diabetes mellitus (HCC) Today HbA1C is 7.0. up from 6.7 in January 2022. Not currently taking medications. Patient electing to pursue lifestyle modifications to lower  HbA1C before initiating medications. Has been successful in the past. Will recheck HbA1C at next visit. Will consider medication management if HbA1C is >7.5.  Arthritis of knee, degenerative Well controlled on tramadol 50mg  as needed. Patient verbalized lifestyle medications to lose weight will also reduce load on right knee and provide relief from mild discomfort. Sent  refill for tramadol 50mg . PDMP reviewed.  GERD Well controlled on pantoprazole 50 mg.  Lichen planus hypertrophicus Sent refill for clobetasol 0.05% for atopic dermatitis. Discussed strength of this medication and reviewed with patient that it should only be used for incredibly dry skin that doesn't improve with over the counter lotion/moisturizer   Health Maintenance Pt will schedule appointment for annual eye exam. Last one was in December 2020. Emphasized importance of exam given diabetes diagnosis. Declined flu and COVID booster vaccinations today.  Provided Shingrix prescription to obtain at local pharmacy.    [place]   Patient seen along with [employer] student Cristobal Goldmann. I personally evaluated this patient along with the student, and verified all aspects of the history, physical exam, and medical decision making as documented by the student. I agree with the student's documentation and have made all necessary edits.  Barbara Netters, MD  [place]

## 2021-05-13 NOTE — Assessment & Plan Note (Signed)
Today HbA1C is 7.0. up from 6.7 in January 2022. Not currently taking medications. Patient electing to pursue lifestyle modifications to lower HbA1C before initiating medications. Has been successful in the past. Will recheck HbA1C at next visit. Will consider medication management if HbA1C is >7.5.

## 2021-05-13 NOTE — Assessment & Plan Note (Addendum)
Pt has been unable to find an affordable CPAP machine on her insurance. Has looked into discount stores numerous times.  Will reach out to case management to see if they can offer additional resources. Patient agreeable to CM consult.

## 2021-05-13 NOTE — Assessment & Plan Note (Signed)
Well controlled on pantoprazole 50 mg.

## 2021-05-13 NOTE — Assessment & Plan Note (Addendum)
Well controlled on tramadol 50mg  as needed. Patient verbalized lifestyle medications to lose weight will also reduce load on right knee and provide relief from mild discomfort. Sent refill for tramadol 50mg . PDMP reviewed.

## 2021-05-13 NOTE — Assessment & Plan Note (Addendum)
Stopped taking atorvastatin 10 mg daily about 6 months ago due to joint pain she experienced as side effect. LDL was elevated at 130 at last visit in January of 2020.  Prescribed rosuvastatin 5 mg daily.  Will recheck lipid levels in 6 months.

## 2021-05-13 NOTE — Assessment & Plan Note (Deleted)
Sent refill for clobetasol 0.05% for atopic dermatitis. Discussed strength of this medication and reviewed with patient that it should only be used for incredibly dry skin that doesn't improve with over the counter lotion/moisturizer.

## 2021-05-13 NOTE — Assessment & Plan Note (Addendum)
Elevated in office today at 146/90. On hydrochlorothiazide 25 mg daily. Given presence of acute stressors and previously well controlled BP on same medication regimen, discussed monitoring BP at home before adjusting medications. Provided education about home BP checks and resting 5 minutes before measuring. Asked patient to reach out if BP is consistently above 140/90 threshold. Will consider adding amlodipine if BP consistently above 140/90 at home. Pt owns BP cuff.

## 2021-05-13 NOTE — Patient Instructions (Addendum)
It was great to see you again today!  Sent in crestor 5mg  daily - new cholesterol medication  Refilled tramadol and clobetasol  Monitor blood pressure at home, let me know if consistently over 140/90.  Take shingles vaccine prescription to pharmacy  Will have case manager reach out about affording CPAP  Follow up with me in 6 months, sooner if needed  Be well, Dr. [name]

## 2021-05-18 NOTE — Assessment & Plan Note (Signed)
Sent refill for clobetasol 0.05% for atopic dermatitis. Discussed strength of this medication and reviewed with patient that it should only be used for incredibly dry skin that doesn't improve with over the counter lotion/moisturizer

## 2021-05-19 ENCOUNTER — Telehealth: Payer: Self-pay | Admitting: *Deleted

## 2021-05-19 NOTE — Chronic Care Management (AMB) (Signed)
  Care Management   Note  05/19/2021 Name: DAISEE CENTNER MRN: [id number] DOB: 07/05/1966  [name] is a 55 y.o. year old female who is a primary care patient of [name] [name], MD. I reached out to [employer] by phone today in response to a referral sent by Ms. Ostrander PCP.    Ms. Nath was given information about care management services today including:  Care management services include personalized support from designated clinical staff supervised by her physician, including individualized plan of care and coordination with other care providers 24/7 contact phone numbers for assistance for urgent and routine care needs. The patient may stop care management services at any time by phone call to the office staff.  Patient agreed to services and verbal consent obtained.   Follow up plan: Telephone appointment with care management team member scheduled for:06/03/21  [place] Management  Direct Dial: [phone]

## 2021-05-21 ENCOUNTER — Other Ambulatory Visit (HOSPITAL_COMMUNITY): Payer: Self-pay

## 2021-05-21 ENCOUNTER — Ambulatory Visit: Payer: 59

## 2021-05-26 ENCOUNTER — Other Ambulatory Visit (HOSPITAL_COMMUNITY): Payer: Self-pay

## 2021-05-30 ENCOUNTER — Emergency Department (HOSPITAL_BASED_OUTPATIENT_CLINIC_OR_DEPARTMENT_OTHER)
Admission: EM | Admit: 2021-05-30 | Discharge: 2021-05-31 | Disposition: A | Discharge status: Home / Self Care | Payer: 59 | Attending: Emergency Medicine | Admitting: Emergency Medicine

## 2021-05-30 ENCOUNTER — Other Ambulatory Visit: Payer: Self-pay

## 2021-05-30 DIAGNOSIS — E119 Type 2 diabetes mellitus without complications: Secondary | ICD-10-CM | POA: Insufficient documentation

## 2021-05-30 DIAGNOSIS — I10 Essential (primary) hypertension: Secondary | ICD-10-CM | POA: Diagnosis not present

## 2021-05-30 DIAGNOSIS — Z79899 Other long term (current) drug therapy: Secondary | ICD-10-CM | POA: Insufficient documentation

## 2021-05-30 DIAGNOSIS — K5732 Diverticulitis of large intestine without perforation or abscess without bleeding: Secondary | ICD-10-CM | POA: Diagnosis not present

## 2021-05-30 DIAGNOSIS — K5792 Diverticulitis of intestine, part unspecified, without perforation or abscess without bleeding: Secondary | ICD-10-CM | POA: Insufficient documentation

## 2021-05-30 DIAGNOSIS — R109 Unspecified abdominal pain: Secondary | ICD-10-CM | POA: Diagnosis present

## 2021-05-30 DIAGNOSIS — K219 Gastro-esophageal reflux disease without esophagitis: Secondary | ICD-10-CM | POA: Diagnosis not present

## 2021-05-30 LAB — CBC WITH DIFFERENTIAL/PLATELET
Abs Immature Granulocytes: 0.04 10*3/uL (ref 0.00–0.07)
Basophils Absolute: 0 10*3/uL (ref 0.0–0.1)
Basophils Relative: 0 %
Eosinophils Absolute: 0.1 10*3/uL (ref 0.0–0.5)
Eosinophils Relative: 1 %
HCT: 39.2 % (ref 36.0–46.0)
Hemoglobin: 13 g/dL (ref 12.0–15.0)
Immature Granulocytes: 0 %
Lymphocytes Relative: 14 %
Lymphs Abs: 1.8 10*3/uL (ref 0.7–4.0)
MCH: 27.7 pg (ref 26.0–34.0)
MCHC: 33.2 g/dL (ref 30.0–36.0)
MCV: 83.6 fL (ref 80.0–100.0)
Monocytes Absolute: 0.8 10*3/uL (ref 0.1–1.0)
Monocytes Relative: 6 %
Neutro Abs: 9.7 10*3/uL — ABNORMAL HIGH (ref 1.7–7.7)
Neutrophils Relative %: 79 %
Platelets: 305 10*3/uL (ref 150–400)
RBC: 4.69 MIL/uL (ref 3.87–5.11)
RDW: 13.7 % (ref 11.5–15.5)
WBC: 12.4 10*3/uL — ABNORMAL HIGH (ref 4.0–10.5)
nRBC: 0 % (ref 0.0–0.2)

## 2021-05-30 LAB — URINALYSIS, ROUTINE W REFLEX MICROSCOPIC
Bilirubin Urine: NEGATIVE
Glucose, UA: NEGATIVE mg/dL
Hgb urine dipstick: NEGATIVE
Ketones, ur: NEGATIVE mg/dL
Leukocytes,Ua: NEGATIVE
Nitrite: NEGATIVE
Protein, ur: NEGATIVE mg/dL
Specific Gravity, Urine: 1.013 (ref 1.005–1.030)
pH: 6 (ref 5.0–8.0)

## 2021-05-30 LAB — COMPREHENSIVE METABOLIC PANEL
ALT: 12 U/L (ref 0–44)
AST: 16 U/L (ref 15–41)
Albumin: 4.6 g/dL (ref 3.5–5.0)
Alkaline Phosphatase: 49 U/L (ref 38–126)
Anion gap: 15 (ref 5–15)
BUN: 9 mg/dL (ref 6–20)
CO2: 25 mmol/L (ref 22–32)
Calcium: 9.9 mg/dL (ref 8.9–10.3)
Chloride: 98 mmol/L (ref 98–111)
Creatinine, Ser: 0.86 mg/dL (ref 0.44–1.00)
GFR, Estimated: >60 mL/min (ref >60)
Glucose, Bld: 133 mg/dL — ABNORMAL HIGH (ref 70–99)
Potassium: 3.4 mmol/L — ABNORMAL LOW (ref 3.5–5.1)
Sodium: 138 mmol/L (ref 135–145)
Total Bilirubin: 0.9 mg/dL (ref 0.3–1.2)
Total Protein: 8.5 g/dL — ABNORMAL HIGH (ref 6.5–8.1)

## 2021-05-30 LAB — LIPASE, BLOOD: Lipase: 17 U/L (ref 11–51)

## 2021-05-30 NOTE — ED Triage Notes (Addendum)
Pt to ED from home with c/o ABD pain that started at 1100 this morning. Pt states she is experiencing N/V and is having muscle tightness in her neck. Pt also states she took rosuvastatin for the first time last night at 1900 24 hours ago.

## 2021-05-31 ENCOUNTER — Other Ambulatory Visit (HOSPITAL_COMMUNITY): Payer: Self-pay

## 2021-05-31 ENCOUNTER — Emergency Department (HOSPITAL_BASED_OUTPATIENT_CLINIC_OR_DEPARTMENT_OTHER): Payer: 59

## 2021-05-31 ENCOUNTER — Encounter (HOSPITAL_BASED_OUTPATIENT_CLINIC_OR_DEPARTMENT_OTHER): Payer: Self-pay | Admitting: Radiology

## 2021-05-31 DIAGNOSIS — K5732 Diverticulitis of large intestine without perforation or abscess without bleeding: Secondary | ICD-10-CM | POA: Diagnosis not present

## 2021-05-31 DIAGNOSIS — R1110 Vomiting, unspecified: Secondary | ICD-10-CM | POA: Diagnosis not present

## 2021-05-31 DIAGNOSIS — I10 Essential (primary) hypertension: Secondary | ICD-10-CM | POA: Diagnosis not present

## 2021-05-31 DIAGNOSIS — Z79899 Other long term (current) drug therapy: Secondary | ICD-10-CM | POA: Diagnosis not present

## 2021-05-31 DIAGNOSIS — E119 Type 2 diabetes mellitus without complications: Secondary | ICD-10-CM | POA: Diagnosis not present

## 2021-05-31 DIAGNOSIS — K219 Gastro-esophageal reflux disease without esophagitis: Secondary | ICD-10-CM | POA: Diagnosis not present

## 2021-05-31 DIAGNOSIS — K5792 Diverticulitis of intestine, part unspecified, without perforation or abscess without bleeding: Secondary | ICD-10-CM | POA: Diagnosis not present

## 2021-05-31 LAB — CK: Total CK: 104 U/L (ref 38–234)

## 2021-05-31 MED ORDER — LACTATED RINGERS IV BOLUS
1000.0000 mL | Freq: Once | INTRAVENOUS | Status: AC
  Administered 2021-05-31: 1000 mL via INTRAVENOUS

## 2021-05-31 MED ORDER — ONDANSETRON 4 MG PO TBDP
ORAL_TABLET | ORAL | 0 refills | Status: DC
  Filled 2021-05-31: qty 30, 5d supply, fill #0

## 2021-05-31 MED ORDER — AMOXICILLIN-POT CLAVULANATE 875-125 MG PO TABS
1.0000 | ORAL_TABLET | Freq: Two times a day (BID) | ORAL | 0 refills | Status: DC
  Filled 2021-05-31: qty 14, 7d supply, fill #0

## 2021-05-31 MED ORDER — FENTANYL CITRATE PF 50 MCG/ML IJ SOSY
50.0000 ug | PREFILLED_SYRINGE | Freq: Once | INTRAMUSCULAR | Status: AC
Start: 2021-05-31 — End: 2021-05-31
  Administered 2021-05-31: 50 ug via INTRAVENOUS
  Filled 2021-05-31: qty 1

## 2021-05-31 MED ORDER — ONDANSETRON HCL 4 MG/2ML IJ SOLN
4.0000 mg | Freq: Once | INTRAMUSCULAR | Status: AC
  Administered 2021-05-31: 4 mg via INTRAVENOUS
  Filled 2021-05-31: qty 2

## 2021-05-31 MED ORDER — HYDROMORPHONE HCL 1 MG/ML IJ SOLN: 1.0000 mg | Freq: Once | INTRAMUSCULAR | Status: DC

## 2021-05-31 MED ORDER — PANTOPRAZOLE SODIUM 40 MG IV SOLR
40.0000 mg | Freq: Once | INTRAVENOUS | Status: AC
  Administered 2021-05-31: 40 mg via INTRAVENOUS
  Filled 2021-05-31: qty 40

## 2021-05-31 MED ORDER — ALUM & MAG HYDROXIDE-SIMETH 200-200-20 MG/5ML PO SUSP
30.0000 mL | Freq: Once | ORAL | Status: AC
  Administered 2021-05-31: 30 mL via ORAL
  Filled 2021-05-31: qty 30

## 2021-05-31 MED ORDER — DOCUSATE SODIUM 100 MG PO CAPS: 100.0000 mg | ORAL_CAPSULE | Freq: Two times a day (BID) | ORAL | 0 refills | Status: DC

## 2021-05-31 MED ORDER — IOHEXOL 300 MG/ML  SOLN
100.0000 mL | Freq: Once | INTRAMUSCULAR | Status: AC | PRN
  Administered 2021-05-31: 100 mL via INTRAVENOUS

## 2021-05-31 MED ORDER — LIDOCAINE VISCOUS HCL 2 % MT SOLN
15.0000 mL | Freq: Once | OROMUCOSAL | Status: AC
  Administered 2021-05-31: 15 mL via ORAL
  Filled 2021-05-31: qty 15

## 2021-05-31 MED ORDER — OXYCODONE-ACETAMINOPHEN 5-325 MG PO TABS: 2.0000 | ORAL_TABLET | ORAL | 0 refills | Status: DC | PRN

## 2021-05-31 MED ORDER — AMOXICILLIN-POT CLAVULANATE 875-125 MG PO TABS
1.0000 | ORAL_TABLET | Freq: Once | ORAL | Status: AC
  Administered 2021-05-31: 1 via ORAL
  Filled 2021-05-31: qty 1

## 2021-05-31 MED ORDER — OXYCODONE-ACETAMINOPHEN 5-325 MG PO TABS
2.0000 | ORAL_TABLET | ORAL | 0 refills | Status: DC | PRN
  Filled 2021-05-31: qty 20, 2d supply, fill #0

## 2021-05-31 NOTE — ED Provider Notes (Signed)
[facility] Provider Note   CSN: [id number] Arrival date & time: 05/30/21  1948     History Chief Complaint  Patient presents with   Abdominal Pain    Barbara Reese is a 55 y.o. female.  55 year old female who presents emerged from today with abdominal pain.  She has associated nausea vomiting and left sided periscapular and paracervical/paraspinal pain.  No no constipation in fact had 1 loose stool.  No sick contacts.  No trauma.  No other associated symptoms.   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   Viral respiratory illness 11/02/2018   Acute bacterial middle ear infection, right 05/12/2018   Infective otitis externa of right ear 05/12/2018   Eustachian tube disorder 04/27/2018   Type 2 diabetes mellitus ([place]) 03/09/2018   Retrocalcaneal bursitis (back of heel), left 07/01/2017   Degenerative arthritis of left knee 03/13/2016   Lumbar radiculopathy  03/13/2016   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   Trigger thumb of right hand 01/02/2016   Situational anxiety 10/08/2014   Rectal bleeding 09/18/2014   Gout of big toe 05/23/2014   Derang of medial meniscus due to old tear/inj, unsp knee 05/17/2014   Arthritis of knee, degenerative 10/26/2013   Hypertension 12/24/2012   Physical examination of employee 12/01/2012   Arthritis 12/01/2012   Elevated LDL cholesterol level 12/16/2011   Prediabetes 12/16/2011   Depression 93/81/0175   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   ANXIETY DISORDER 11/06/2009   OBESITY, MODERATE 01/04/2009   OBSTRUCTIVE SLEEP APNEA 12/28/2008   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 Percell Miller   MICROLARYNGOSCOPY N/A 11/29/2017   Procedure: MICROLARYNGOSCOPY;  Surgeon: Margaretha Sheffield, MD;  Location: [facility];  Service: ENT;  Laterality: N/A;   ROBOTIC ASSISTED TOTAL HYSTERECTOMY WITH SALPINGECTOMY N/A 02/18/2016   Procedure: ROBOTIC ASSISTED TOTAL  HYSTERECTOMY WITH SALPINGECTOMY;  Surgeon: Lavonia Drafts, MD;  Location: [facility];  Service: Gynecology;  Laterality: N/A;   UPPER GI ENDOSCOPY     gerd     OB History   No obstetric history on file.     Family History  Problem Relation Age of Onset   Osteoarthritis Mother    Hypertension Mother    Allergies Mother    Rheum arthritis Mother    Diabetes Father    Hypertension Sister    Heart attack Brother    Heart attack Sister    Allergies Son    Allergies Sister    Asthma Son     Clotting disorder Sister    Breast cancer Paternal Grandmother 10    Social History   Tobacco Use   Smoking status: Never   Smokeless tobacco: Never  Vaping Use   Vaping Use: Never used  Substance Use Topics   Alcohol use: Yes    Alcohol/week: 2.0 standard drinks    Types: 2 Glasses of wine per week    Comment: wine OCC   Drug use: No    Home Medications Prior to Admission medications   Medication Sig Start Date End Date Taking? Authorizing Provider  amoxicillin-clavulanate (AUGMENTIN) 875-125 MG tablet Take 1 tablet by mouth 2 (two) times daily. One po bid x 7 days 05/31/21  Yes Oluwafemi Villella, [name], MD  docusate sodium (COLACE) 100 MG capsule Take 1 capsule (100 mg total) by mouth every 12 (twelve) hours. 05/31/21  Yes Dawanna Grauberger, [name], MD  ondansetron (ZOFRAN ODT) 4 MG disintegrating tablet 4mg  ODT q4 hours prn nausea/vomit 05/31/21  Yes Calayah Guadarrama, [name], MD  oxyCODONE-acetaminophen (PERCOCET) 5-325 MG tablet Take 2 tablets by mouth every 4 (four) hours as needed. 05/31/21  Yes Alphonsus Doyel, [name], MD  cetirizine (ZYRTEC) 10 MG tablet Take 10 mg by mouth daily as needed for allergies.    [provider]  clobetasol cream (TEMOVATE) 0.05 % APPLY TO THE AFFECTED AREA(S) TOPICALLY DAILY AS NEEDED. 05/13/21   [name], MD  cyclobenzaprine (FLEXERIL) 10 MG tablet Take 1 tablet (10 mg total) by mouth 3 (three) times daily as needed for muscle spasms. 08/16/19   [name], MD  fluticasone (FLONASE) 50 MCG/ACT nasal spray PLACE 2 SPRAYS INTO BOTH NOSTRILS DAILY AS NEEDED FOR ALLERGIES. 04/30/20   [name], MD  fluticasone [facility]) 50 MCG/ACT nasal spray PLACE 2 SPRAYS INTO BOTH NOSTRILS DAILY AS NEEDED FOR ALLERGIES. 04/30/20 05/31/21  [name], MD  hydrochlorothiazide (HYDRODIURIL) 25 MG tablet Take 1 tablet (25 mg total) by mouth daily. 02/17/21   [name], MD  ibuprofen (ADVIL,MOTRIN) 200 MG tablet Take 600 mg by mouth 2 (two) times daily as  needed for headache or moderate pain.    [provider]  Multiple Vitamin (MULTIVITAMIN WITH MINERALS) TABS tablet Take 1 tablet by mouth daily.    [provider]  pantoprazole (PROTONIX) 40 MG tablet Take 1 tablet (40 mg total) by mouth daily. 02/17/21   [name], MD  rosuvastatin (CRESTOR) 5 MG tablet Take 1 tablet (5 mg total) by mouth daily. 05/13/21   [name], MD  traMADol (ULTRAM) 50 MG tablet Take 1 tablet (50 mg total) by mouth every 8 (eight) hours as needed for severe pain. 05/13/21   [name], MD    Allergies    Gabapentin  Review of Systems   Review of Systems  Gastrointestinal:  Positive for abdominal pain.  All  other systems reviewed and are negative.  Physical Exam Updated Vital Signs BP 125/74 (BP Location: Right Arm)   Pulse 68   Temp 98.3 F (36.8 C) (Oral)   Resp 20   Ht 5\' 6"  (1.676 m)   Wt 119.3 kg   LMP 02/09/2016 (Exact Date)   SpO2 100%   BMI 42.45 kg/m   Physical Exam Vitals and nursing note reviewed.  Constitutional:      Appearance: She is well-developed.  HENT:     Head: Normocephalic and atraumatic.     Mouth/Throat:     Mouth: Mucous membranes are moist.  Eyes:     Pupils: Pupils are equal, round, and reactive to light.  Cardiovascular:     Rate and Rhythm: Normal rate and regular rhythm.  Pulmonary:     Effort: No respiratory distress.     Breath sounds: No stridor.  Abdominal:     General: There is no distension.     Tenderness: There is no abdominal tenderness.     Hernia: No hernia is present.  Musculoskeletal:     Cervical back: Normal range of motion.  Skin:    General: Skin is warm and dry.  Neurological:     General: No focal deficit present.     Mental Status: She is alert.    ED Results / Procedures / Treatments   Labs (all labs ordered are listed, but only abnormal results are displayed) Labs Reviewed  CBC WITH DIFFERENTIAL/PLATELET - Abnormal; Notable for the  following components:      Result Value   WBC 12.4 (*)    Neutro Abs 9.7 (*)    All other components within normal limits  COMPREHENSIVE METABOLIC PANEL - Abnormal; Notable for the following components:   Potassium 3.4 (*)    Glucose, Bld 133 (*)    Total Protein 8.5 (*)    All other components within normal limits  LIPASE, BLOOD  URINALYSIS, ROUTINE W REFLEX MICROSCOPIC  CK    EKG None  Radiology CT ABDOMEN PELVIS W CONTRAST  Result Date: 05/31/2021 CLINICAL DATA:  Abdominal abscess or infection suspected. Nausea and vomiting. EXAM: CT ABDOMEN AND PELVIS WITH CONTRAST TECHNIQUE: Multidetector CT imaging of the abdomen and pelvis was performed using the standard protocol following bolus administration of intravenous contrast. CONTRAST:  149mL OMNIPAQUE IOHEXOL 300 MG/ML  SOLN COMPARISON:  None. FINDINGS: Lower chest: Mild dependent changes in the lung bases. Hepatobiliary: Diffuse fatty infiltration of the liver. No focal lesions. Surgical absence of the gallbladder. No bile duct dilatation. Pancreas: Unremarkable. No pancreatic ductal dilatation or surrounding inflammatory changes. Spleen: Normal in size without focal abnormality. Adrenals/Urinary Tract: Adrenal glands are unremarkable. Kidneys are normal, without renal calculi, focal lesion, or hydronephrosis. Bladder is unremarkable. Stomach/Bowel: Stomach, small bowel, and colon are not abnormally distended. In the mid descending colon, there is a focal area of wall thickening with surrounding fat stranding. This likely represents acute diverticulitis. Because of the focal nature of wall thickening, an underlying colon cancer should also be excluded and follow-up after resolution of the acute process is recommended. No abscess. Appendix is normal. Vascular/Lymphatic: No significant vascular findings are present. No enlarged abdominal or pelvic lymph nodes. Reproductive: Status post hysterectomy. No adnexal masses. Other: Small amount of free  fluid in the pelvis is likely reactive. No free air. Small periumbilical hernia containing fat. Additional small supraumbilical hernia containing fat is also present. No fat necrosis. Circumscribed low-attenuation lesion in the subcutaneous fat posterior to L2-3. This  likely represents a sebaceous cyst. Musculoskeletal: No acute or significant osseous findings. IMPRESSION: 1. Focal wall thickening and adjacent fat stranding at the sigmoid colon likely represents acute diverticulitis. No abscess. Follow-up after resolution of the acute process is recommended to exclude underlying colon cancer. 2. Diffuse fatty infiltration of the liver. 3. Small fat containing periumbilical and supraumbilical hernias are present. Electronically Signed   By: Lucienne Capers M.D.   On: 05/31/2021 03:36    Procedures Procedures   Medications Ordered in ED Medications  HYDROmorphone (DILAUDID) injection 1 mg (has no administration in time range)  lactated ringers bolus 1,000 mL (0 mLs Intravenous Stopped 05/31/21 0401)  ondansetron (ZOFRAN) injection 4 mg (4 mg Intravenous Given 05/31/21 0043)  alum & mag hydroxide-simeth (MAALOX/MYLANTA) 200-200-20 MG/5ML suspension 30 mL (30 mLs Oral Given 05/31/21 0043)    And  lidocaine (XYLOCAINE) 2 % viscous mouth solution 15 mL (15 mLs Oral Given 05/31/21 0043)  fentaNYL (SUBLIMAZE) injection 50 mcg (50 mcg Intravenous Given 05/31/21 0248)  pantoprazole (PROTONIX) injection 40 mg (40 mg Intravenous Given 05/31/21 0248)  iohexol (OMNIPAQUE) 300 MG/ML solution 100 mL (100 mLs Intravenous Contrast Given 05/31/21 0313)  amoxicillin-clavulanate (AUGMENTIN) 875-125 MG per tablet 1 tablet (1 tablet Oral Given 05/31/21 0356)  lactated ringers bolus 1,000 mL (0 mLs Intravenous Stopped 05/31/21 0448)    ED Course  I have reviewed the triage vital signs and the nursing notes.  Pertinent labs & imaging results that were available during my care of the patient were reviewed by me and considered  in my medical decision making (see chart for details).    MDM Rules/Calculators/A&P                         Uncomplicated diverticulitis, started antibiotics here will continue at home.  Pain meds and stool softeners given as well. Will follow up with PCP in a few days, return here for worsening symptoms.    Final Clinical Impression(s) / ED Diagnoses Final diagnoses:  Diverticulitis    Rx / DC Orders ED Discharge Orders          Ordered    amoxicillin-clavulanate (AUGMENTIN) 875-125 MG tablet  2 times daily        05/31/21 0435    ondansetron (ZOFRAN ODT) 4 MG disintegrating tablet        05/31/21 0435    oxyCODONE-acetaminophen (PERCOCET) 5-325 MG tablet  Every 4 hours PRN        05/31/21 0435    docusate sodium (COLACE) 100 MG capsule  Every 12 hours        05/31/21 0436             Xoie Kreuser, [name], MD 05/31/21 2202

## 2021-06-03 ENCOUNTER — Other Ambulatory Visit (HOSPITAL_COMMUNITY): Payer: Self-pay

## 2021-06-03 ENCOUNTER — Ambulatory Visit: Payer: 59

## 2021-06-03 NOTE — Chronic Care Management (AMB) (Signed)
Care Management    RN Visit Note  06/03/2021 Name: Barbara Reese MRN: [id number] DOB: Aug 20, 1966  Subjective: Barbara Reese is a 55 y.o. year old female who is a primary care patient of Barbara Mems [name], MD. The care management team was consulted for assistance with disease management and care coordination needs.    Engaged with patient by telephone for initial visit in response to provider referral for case management and/or care coordination services.   Consent to Services:   Barbara Reese was given information about Care Management services today including:  Care Management services includes personalized support from designated clinical staff supervised by her physician, including individualized plan of care and coordination with other care providers 24/7 contact phone numbers for assistance for urgent and routine care needs. The patient may stop case management services at any time by phone call to the office staff.  Patient agreed to services and consent obtained.    Assessment:  The patient  continues to experience difficulty with obtaining her a CPAP machine.. See Care Plan below for interventions and patient self-care actives. Follow up Plan: Patient would like continued follow-up.  CCM RNCM will outreach the patient within the next 2 weeks.  Patient will call office if needed prior to next encounter  Review of patient past medical history, allergies, medications, health status, including review of consultants reports, laboratory and other test data, was performed as part of comprehensive evaluation and provision of chronic care management services.   SDOH (Social Determinants of Health) assessments and interventions performed:    Care Plan  Allergies  Allergen Reactions   Gabapentin Rash    Itchy rash    Outpatient Encounter Medications as of 06/03/2021  Medication Sig   amoxicillin-clavulanate (AUGMENTIN) 875-125 MG tablet Take 1 tablet by mouth 2 (two)  times daily for 7 days   cetirizine (ZYRTEC) 10 MG tablet Take 10 mg by mouth daily as needed for allergies.   clobetasol cream (TEMOVATE) 0.05 % APPLY TO THE AFFECTED AREA(S) TOPICALLY DAILY AS NEEDED.   cyclobenzaprine (FLEXERIL) 10 MG tablet Take 1 tablet (10 mg total) by mouth 3 (three) times daily as needed for muscle spasms.   docusate sodium (COLACE) 100 MG capsule Take 1 capsule (100 mg total) by mouth every 12 (twelve) hours.   fluticasone (FLONASE) 50 MCG/ACT nasal spray PLACE 2 SPRAYS INTO BOTH NOSTRILS DAILY AS NEEDED FOR ALLERGIES.   hydrochlorothiazide (HYDRODIURIL) 25 MG tablet Take 1 tablet (25 mg total) by mouth daily.   ibuprofen (ADVIL,MOTRIN) 200 MG tablet Take 600 mg by mouth 2 (two) times daily as needed for headache or moderate pain.   Multiple Vitamin (MULTIVITAMIN WITH MINERALS) TABS tablet Take 1 tablet by mouth daily.   ondansetron (ZOFRAN ODT) 4 MG disintegrating tablet 4mg  ODT q4 hours prn nausea/vomit   oxyCODONE-acetaminophen (PERCOCET) 5-325 MG tablet Take 2 tablets by mouth every 4 (four) hours as needed.   pantoprazole (PROTONIX) 40 MG tablet Take 1 tablet (40 mg total) by mouth daily.   rosuvastatin (CRESTOR) 5 MG tablet Take 1 tablet (5 mg total) by mouth daily.   traMADol (ULTRAM) 50 MG tablet Take 1 tablet (50 mg total) by mouth every 8 (eight) hours as needed for severe pain.   fluticasone (FLONASE) 50 MCG/ACT nasal spray PLACE 2 SPRAYS INTO BOTH NOSTRILS DAILY AS NEEDED FOR ALLERGIES.   oxyCODONE-acetaminophen (PERCOCET/ROXICET) 5-325 MG tablet Take 2 tablets by mouth every 4 (four) hours as needed.   No facility-administered encounter medications on  file as of 06/03/2021.    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   Viral respiratory illness  11/02/2018   Acute bacterial middle ear infection, right 05/12/2018   Infective otitis externa of right ear 05/12/2018   Eustachian tube disorder 04/27/2018   Type 2 diabetes mellitus ([place]) 03/09/2018   Retrocalcaneal bursitis (back of heel), left 07/01/2017   Degenerative arthritis of left knee 03/13/2016   Lumbar radiculopathy 03/13/2016   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   Trigger thumb of right hand 01/02/2016   Situational anxiety 10/08/2014   Rectal bleeding 09/18/2014   Gout of big toe 05/23/2014   Derang of medial meniscus due to old tear/inj, unsp knee 05/17/2014   Arthritis of knee, degenerative 10/26/2013   Hypertension 12/24/2012   Physical examination of employee 12/01/2012   Arthritis 12/01/2012   Elevated LDL cholesterol level 12/16/2011   Prediabetes 12/16/2011   Depression 05/16/3006   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   ANXIETY DISORDER 11/06/2009   OBESITY, MODERATE 01/04/2009   OBSTRUCTIVE SLEEP APNEA 12/28/2008   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   GERD 02/01/2008    Conditions to be addressed/monitored:  Obstructive sleep apnea  Care Plan : RN Case Manager  Updates made by Barbara Arms, RN since 06/03/2021 12:00 AM     Problem: (COPD)      Goal: Patient will be able to obtain a CPAP to manage her obstructive sleep apnea   Start Date: 06/03/2021  Expected End Date: 07/23/2021  Priority: High  Note:   Current Barriers:  Care Coordination needs related to Financial constraints related to obtaining a CPAP iin a patient with Obstructive sleep apnea  RNCM Clinical Goal(s):  Patient will verbalize understanding of plan for management of Obstructive sleep apnea  through collaboration with RN Care manager, provider, and care team.   Interventions: 1:1 collaboration with primary care provider regarding development and update of comprehensive plan of care as evidenced by provider attestation and  co-signature Inter-disciplinary care team collaboration (see longitudinal plan of care) Evaluation of current treatment plan related to  self management and patient's adherence to plan as established by provider    06/03/2021  (Status: New goal.) Evaluation of current treatment plan related to  Financial constraints related to CPAP for Obstructive sleep apnea  self-management and patient's adherence to plan as established by provider. Discussed plans with patient for ongoing care management follow up and provided patient with direct contact information for care management team Advised patient That I will contact [employer] provider to seek information regarding CPAP Reviewed medications with patient and discussed  RNCM did research calling discount DME companies for the patient obtaiing information- -Product/process development scientist ( spoke with Jeannene Patella )if purchasing on her own the patient would need an order and she would be paying 1200 dollars out of pocket.  The patient stated that she has tried [facility]. Patient states that she will only be able to afford 200 dollars out of pocket for a CPAP machine.She said that her last sleep study was about 2 years ago   Patient Goals/Self-Care Activities: Patient will self administer medications as prescribed as evidenced by self report/primary caregiver report  Patient will attend all scheduled provider appointments as evidenced by clinician review of documented attendance to scheduled appointments and patient/caregiver  report Patient will call pharmacy for medication refills as evidenced by patient report and review of pharmacy fill history as appropriate Patient will call provider office for new concerns or questions as evidenced by review of documented incoming telephone call notes and patient report       Barbara Arms RN, BSN, [facility] Care Management Coordinator [place] Phone: [phone] I Fax: [phone]

## 2021-06-03 NOTE — Patient Instructions (Signed)
Ms. Sleeth  it was nice speaking with you. Please call me directly [phone] if you have questions about the goals we discussed.   Goals Addressed               This Visit's Progress     I need a CPAP (pt-stated)        Timeframe:  Short-Term Goal Priority:  High Start Date:     06/03/21                        Expected End Date:   07/23/21                    Patient Goals/Self-Care Activities: Patient will self administer medications as prescribed as evidenced by self report/primary caregiver report  Patient will attend all scheduled provider appointments as evidenced by clinician review of documented attendance to scheduled appointments and patient/caregiver report Patient will call pharmacy for medication refills as evidenced by patient report and review of pharmacy fill history as appropriate Patient will call provider office for new concerns or questions as evidenced by review of documented incoming telephone call notes and patient report          Patient Care Plan: RN Case Manager     Problem Identified: (COPD)      Goal: Patient will be able to obtain a CPAP to manage her obstructive sleep apnea   Start Date: 06/03/2021  Expected End Date: 07/23/2021  Priority: High  Note:   Current Barriers:  Care Coordination needs related to Financial constraints related to obtaining a CPAP iin a patient with Obstructive sleep apnea  RNCM Clinical Goal(s):  Patient will verbalize understanding of plan for management of Obstructive sleep apnea  through collaboration with RN Care manager, provider, and care team.   Interventions: 1:1 collaboration with primary care provider regarding development and update of comprehensive plan of care as evidenced by provider attestation and co-signature Inter-disciplinary care team collaboration (see longitudinal plan of care) Evaluation of current treatment plan related to  self management and patient's adherence to plan as established  by provider    06/03/2021  (Status: New goal.) Evaluation of current treatment plan related to  Financial constraints related to CPAP for Obstructive sleep apnea  self-management and patient's adherence to plan as established by provider. Discussed plans with patient for ongoing care management follow up and provided patient with direct contact information for care management team Advised patient That I will contact [employer] provider to seek information regarding CPAP Reviewed medications with patient and discussed  RNCM did research calling discount DME companies for the patient obtaiing information- -Product/process development scientist ( spoke with Jeannene Patella )if purchasing on her own the patient would need an order and she would be paying 1200 dollars out of pocket.  The patient stated that she has tried Sleep better. Patient states that she will only be able to afford 200 dollars out of pocket for a CPAP machine.She said that her last sleep study was about 2 years ago   Patient Goals/Self-Care Activities: Patient will self administer medications as prescribed as evidenced by self report/primary caregiver report  Patient will attend all scheduled provider appointments as evidenced by clinician review of documented attendance to scheduled appointments and patient/caregiver report Patient will call pharmacy for medication refills as evidenced by patient report and review of pharmacy fill history as appropriate Patient will call provider office for new concerns or questions as evidenced by review of  documented incoming telephone call notes and patient report        Ms. Halls received Care Management services today:  Care Management services include personalized support from designated clinical staff supervised by her physician, including individualized plan of care and coordination with other care providers 24/7 contact [phone] for assistance for urgent and routine care needs. Care Management are voluntary  services and be declined at any time by calling the office.  The patient verbalized understanding of instructions provided today and agreed to receive a mailed copy of patient instruction and/or educational materials.   Follow Up Plan: Patient would like continued follow-up.  CCM RNCM will outreach the patient within the next 2 weeks  Patient will call office if needed prior to next encounter   Lazaro Arms, RN   [phone]

## 2021-06-09 ENCOUNTER — Other Ambulatory Visit (HOSPITAL_COMMUNITY): Payer: Self-pay

## 2021-06-16 ENCOUNTER — Other Ambulatory Visit (HOSPITAL_COMMUNITY): Payer: Self-pay

## 2021-06-17 ENCOUNTER — Other Ambulatory Visit: Payer: Self-pay

## 2021-06-17 ENCOUNTER — Ambulatory Visit
Admission: RE | Admit: 2021-06-17 | Discharge: 2021-06-17 | Disposition: A | Discharge status: Home / Self Care | Payer: 59 | Source: Ambulatory Visit | Attending: Family Medicine | Admitting: Family Medicine

## 2021-06-17 DIAGNOSIS — Z1231 Encounter for screening mammogram for malignant neoplasm of breast: Secondary | ICD-10-CM

## 2021-06-18 ENCOUNTER — Telehealth: Payer: 59

## 2021-06-19 ENCOUNTER — Ambulatory Visit: Payer: 59

## 2021-06-20 NOTE — Chronic Care Management (AMB) (Signed)
Chronic Care Management   CCM RN Visit Note  06/20/2021 Name: Barbara Reese MRN: [id number] DOB: 08-31-1965  Subjective: Barbara Reese is a 55 y.o. year old female who is a primary care patient of [name] [name], MD. The care management team was consulted for assistance with disease management and care coordination needs.    Engaged with patient by telephone for follow up visit in response to provider referral for case management and/or care coordination services.   Consent to Services:  The patient was given information about Chronic Care Management services, agreed to services, and gave verbal consent prior to initiation of services.  Please see initial visit note for detailed documentation.   Patient agreed to services and verbal consent obtained.    Assessment:  The patient and I are working together to find her last sleep study and if not will have a new test performed . See Care Plan below for interventions and patient self-care actives. Follow up Plan: Patient would like continued follow-up.  CCM RNCM will outreach the patient within the next 2 weeks  Patient will call office if needed prior to next encounter Review of patient past medical history, allergies, medications, health status, including review of consultants reports, laboratory and other test data, was performed as part of comprehensive evaluation and provision of chronic care management services.   SDOH (Social Determinants of Health) assessments and interventions performed:    CCM Care Plan  Allergies  Allergen Reactions   Gabapentin Rash    Itchy rash    Outpatient Encounter Medications as of 06/19/2021  Medication Sig   amoxicillin-clavulanate (AUGMENTIN) 875-125 MG tablet Take 1 tablet by mouth 2 (two) times daily for 7 days   cetirizine (ZYRTEC) 10 MG tablet Take 10 mg by mouth daily as needed for allergies.   clobetasol cream (TEMOVATE) 0.05 % APPLY TO THE AFFECTED AREA(S) TOPICALLY DAILY AS  NEEDED.   cyclobenzaprine (FLEXERIL) 10 MG tablet Take 1 tablet (10 mg total) by mouth 3 (three) times daily as needed for muscle spasms.   docusate sodium (COLACE) 100 MG capsule Take 1 capsule (100 mg total) by mouth every 12 (twelve) hours.   fluticasone (FLONASE) 50 MCG/ACT nasal spray PLACE 2 SPRAYS INTO BOTH NOSTRILS DAILY AS NEEDED FOR ALLERGIES.   hydrochlorothiazide (HYDRODIURIL) 25 MG tablet Take 1 tablet (25 mg total) by mouth daily.   ibuprofen (ADVIL,MOTRIN) 200 MG tablet Take 600 mg by mouth 2 (two) times daily as needed for headache or moderate pain.   Multiple Vitamin (MULTIVITAMIN WITH MINERALS) TABS tablet Take 1 tablet by mouth daily.   ondansetron (ZOFRAN ODT) 4 MG disintegrating tablet 4mg  ODT q4 hours prn nausea/vomit   oxyCODONE-acetaminophen (PERCOCET) 5-325 MG tablet Take 2 tablets by mouth every 4 (four) hours as needed.   oxyCODONE-acetaminophen (PERCOCET/ROXICET) 5-325 MG tablet Take 2 tablets by mouth every 4 (four) hours as needed.   pantoprazole (PROTONIX) 40 MG tablet Take 1 tablet (40 mg total) by mouth daily.   rosuvastatin (CRESTOR) 5 MG tablet Take 1 tablet (5 mg total) by mouth daily.   traMADol (ULTRAM) 50 MG tablet Take 1 tablet (50 mg total) by mouth every 8 (eight) hours as needed for severe pain.   No facility-administered encounter medications on file as of 06/19/2021.    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   Viral respiratory illness 11/02/2018   Acute bacterial middle ear infection, right 05/12/2018   Infective otitis externa of right ear 05/12/2018   Eustachian tube disorder 04/27/2018   Type 2 diabetes mellitus ([place]) 03/09/2018   Retrocalcaneal bursitis (back of heel), left 07/01/2017   Degenerative arthritis of left knee 03/13/2016    Lumbar radiculopathy 03/13/2016   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   Trigger thumb of right hand 01/02/2016   Situational anxiety 10/08/2014   Rectal bleeding 09/18/2014   Gout of big toe 05/23/2014   Derang of medial meniscus due to old tear/inj, unsp knee 05/17/2014   Arthritis of knee, degenerative 10/26/2013   Hypertension 12/24/2012   Physical examination of employee 12/01/2012   Arthritis 12/01/2012   Elevated LDL cholesterol level 12/16/2011   Prediabetes 12/16/2011   Depression 40/05/2724   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   ANXIETY DISORDER 11/06/2009   OBESITY, MODERATE 01/04/2009   OBSTRUCTIVE SLEEP APNEA 12/28/2008   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   GERD 02/01/2008    Conditions to be addressed/monitored: CPAP  Care Plan : RN Case Manager  Updates made by Lazaro Arms, RN since 06/20/2021 12:00 AM     Problem: (COPD)      Goal: Patient will be able to obtain a CPAP to manage her obstructive sleep apnea   Start Date: 06/03/2021  Expected End Date: 07/23/2021  Priority: High  Note:   Current Barriers:  Care Coordination needs related to Financial constraints related to obtaining a CPAP iin a patient with Obstructive sleep apnea  RNCM Clinical Goal(s):  Patient will verbalize understanding of plan for management of Obstructive sleep apnea  through collaboration with RN Care manager, provider, and care team.   Interventions: 1:1 collaboration with primary care provider regarding development and update of comprehensive plan of care as evidenced by provider attestation and co-signature Inter-disciplinary care team collaboration (see longitudinal plan of care) Evaluation of current treatment plan related to  self management and patient's adherence to plan as established by provider    06/03/2021  (Status: New goal.) Evaluation of current treatment plan related to  Financial constraints related to CPAP for  Obstructive sleep apnea  self-management and patient's adherence to plan as established by provider. Discussed plans with patient for ongoing care management follow up and provided patient with direct contact information for care management team Advised patient That I will contact [employer] provider to seek information regarding CPAP Reviewed medications with patient and discussed  RNCM did research calling discount DME companies for the patient obtaiing information- -Product/process development scientist ( spoke with Jeannene Patella )if purchasing on her own the patient would need an order and she would be paying 1200 dollars out of pocket.  The patient stated that she has tried Sleep better. Patient states that she will only be able to afford 200 dollars out of pocket for a CPAP machine.She said that her last sleep study was about 2 years ago 06/19/21: I talked with Farve, and we discussed when she had her last sleep study. I was looking for it in the chart. She said the sleep Study was done in [place] with Dr. Ulis Rias, ENT. I look for the office and see if we can obtain the test; if not, the patient agrees to have the PCP write an order for another sleep test and do the procedure again. I have spoken with the staff at [facility] for Collaboration, and we will  work together on this.   Patient Goals/Self-Care Activities: Patient will self administer medications as prescribed as evidenced by self report/primary caregiver report  Patient will attend all scheduled provider appointments as evidenced by clinician review of documented attendance to scheduled appointments and patient/caregiver report Patient will call pharmacy for medication refills as evidenced by patient report and review of pharmacy fill history as appropriate Patient will call provider office for new concerns or questions as evidenced by review of documented incoming telephone call notes and patient report       Lazaro Arms RN, BSN, [facility] Care Management  Coordinator [place] Phone: [phone] I Fax: [phone]

## 2021-06-20 NOTE — Patient Instructions (Signed)
Visit Information  Barbara Reese  it was nice speaking with you. Please call me directly [phone] if you have questions about the goals we discussed.  Timeframe:  Short-Term Goal Priority:  High Start Date:     06/03/21                        Expected End Date:   07/23/21                    Patient Goals/Self-Care Activities: Patient will self administer medications as prescribed as evidenced by self report/primary caregiver report  Patient will attend all scheduled provider appointments as evidenced by clinician review of documented attendance to scheduled appointments and patient/caregiver report Patient will call pharmacy for medication refills as evidenced by patient report and review of pharmacy fill history as appropriate Patient will call provider office for new concerns or questions as evidenced by review of documented incoming telephone call notes and patient report    The patient verbalized understanding of instructions, educational materials, and care plan provided today and declined offer to receive copy of patient instructions, educational materials, and care plan.   Follow up Plan: Patient would like continued follow-up.  CCM RNCM will outreach the patient within the next 2 weeks  Patient will call office if needed prior to next encounter  Lazaro Arms, RN  [phone]

## 2021-06-23 ENCOUNTER — Ambulatory Visit: Payer: 59

## 2021-06-23 ENCOUNTER — Other Ambulatory Visit: Payer: Self-pay

## 2021-06-23 ENCOUNTER — Ambulatory Visit (INDEPENDENT_AMBULATORY_CARE_PROVIDER_SITE_OTHER): Payer: 59

## 2021-06-23 ENCOUNTER — Ambulatory Visit (INDEPENDENT_AMBULATORY_CARE_PROVIDER_SITE_OTHER): Payer: 59 | Admitting: Family Medicine

## 2021-06-23 ENCOUNTER — Other Ambulatory Visit (HOSPITAL_COMMUNITY): Payer: Self-pay

## 2021-06-23 VITALS — BP 150/90 | HR 74 | Ht 66.0 in | Wt 261.8 lb

## 2021-06-23 DIAGNOSIS — M109 Gout, unspecified: Secondary | ICD-10-CM | POA: Diagnosis not present

## 2021-06-23 DIAGNOSIS — M79672 Pain in left foot: Secondary | ICD-10-CM

## 2021-06-23 LAB — BASIC METABOLIC PANEL WITH GFR
BUN: 14 mg/dL (ref 6–23)
CO2: 32 meq/L (ref 19–32)
Calcium: 9.7 mg/dL (ref 8.4–10.5)
Chloride: 101 meq/L (ref 96–112)
Creatinine, Ser: 0.84 mg/dL (ref 0.40–1.20)
GFR: 78.13 mL/min
Glucose, Bld: 163 mg/dL — ABNORMAL HIGH (ref 70–99)
Potassium: 3.6 meq/L (ref 3.5–5.1)
Sodium: 140 meq/L (ref 135–145)

## 2021-06-23 LAB — URIC ACID: Uric Acid, Serum: 7.2 mg/dL — ABNORMAL HIGH (ref 2.4–7.0)

## 2021-06-23 MED ORDER — COLCHICINE 0.6 MG PO TABS
0.6000 mg | ORAL_TABLET | Freq: Every day | ORAL | 2 refills | Status: DC | PRN
  Filled 2021-06-23: qty 30, 30d supply, fill #0

## 2021-06-23 MED ORDER — ALLOPURINOL 300 MG PO TABS
300.0000 mg | ORAL_TABLET | Freq: Every day | ORAL | 3 refills | Status: DC
  Filled 2021-06-23: qty 90, 90d supply, fill #0

## 2021-06-23 MED ORDER — HYDROCODONE-ACETAMINOPHEN 5-325 MG PO TABS
1.0000 | ORAL_TABLET | Freq: Four times a day (QID) | ORAL | 0 refills | Status: DC | PRN
  Filled 2021-06-23: qty 15, 4d supply, fill #0

## 2021-06-23 MED ORDER — KETOROLAC TROMETHAMINE 60 MG/2ML IM SOLN
60.0000 mg | Freq: Once | INTRAMUSCULAR | Status: AC
  Administered 2021-06-23: 60 mg via INTRAMUSCULAR

## 2021-06-23 NOTE — Chronic Care Management (AMB) (Signed)
Care Management  Collaboration  Note  06/23/2021 Name: Barbara Reese MRN: [id number] DOB: [birth year]  Barbara Reese is a 55 y.o. year old female who is a primary care patient of [name], [name], MD. The CCM team was consulted for Collaboration  reference chronic disease management and or care coordination needs.    Assessment: . CCM RNCM  collaborated with the Referral coordinator at [facility] .  She was able to help me find the ENT physician at [facility] [place], who read the patient's sleep study.   The office will fax the sleep study to the [facility], and once we have it, I will have [employer] review it, and they can let me know our next steps.  Conducted brief assessment, recommendations, and relevant information discussed. See Care Plan below for interventions and patient self-care actives.  Intervention: The sleep study was requested and will be faxed to the office.  The patient was not interviewed for this encounter.  Follow up Plan: RNCM will follow up at the next scheduled Interval.  Collaboration with Barbara Rio, MD regarding development and update of comprehensive plan of care as evidenced by provider attestation and co-signature Review of patient past medical history, allergies, medications, and health status, including review of pertinent consultant reports was performed as part of comprehensive evaluation and provision of care management/care coordination services.   Care Plan Conditions to be addressed/monitored per PCP order:  CPAP Patient Care Plan: RN Case Manager     Problem Identified: (COPD)      Goal: Patient will be able to obtain a CPAP to manage her obstructive sleep apnea   Start Date: 06/03/2021  Expected End Date: 07/23/2021  Priority: High  Note:   Current Barriers:  Care Coordination needs related to Financial constraints related to obtaining a CPAP iin a patient with Obstructive sleep apnea  RNCM Clinical Goal(s):  Patient will  verbalize understanding of plan for management of Obstructive sleep apnea  through collaboration with RN Care manager, provider, and care team.   Interventions: 1:1 collaboration with primary care provider regarding development and update of comprehensive plan of care as evidenced by provider attestation and co-signature Inter-disciplinary care team collaboration (see longitudinal plan of care) Evaluation of current treatment plan related to  self management and patient's adherence to plan as established by provider    06/03/2021  (Status: New goal.) Evaluation of current treatment plan related to  Financial constraints related to CPAP for Obstructive sleep apnea  self-management and patient's adherence to plan as established by provider. Discussed plans with patient for ongoing care management follow up and provided patient with direct contact information for care management team Advised patient That I will contact [employer] provider to seek information regarding CPAP Reviewed medications with patient and discussed  RNCM did research calling discount DME companies for the patient obtaiing information- -Product/process development scientist ( spoke with Jeannene Patella )if purchasing on her own the patient would need an order and she would be paying 1200 dollars out of pocket.  The patient stated that she has tried Sleep better. Patient states that she will only be able to afford 200 dollars out of pocket for a CPAP machine.She said that her last sleep study was about 2 years ago 06/23/21: [place] with the Referral coordinator at [facility].  She was able to help me find the ENT physician at [facility] [place], who read the patient's sleep study.   The office will fax the sleep study to the  [place], and once we have it, I will have [employer] review it, and they can let me know our next steps.   Patient Goals/Self-Care Activities: Patient will self administer medications as prescribed as evidenced by self report/primary  caregiver report  Patient will attend all scheduled provider appointments as evidenced by clinician review of documented attendance to scheduled appointments and patient/caregiver report Patient will call pharmacy for medication refills as evidenced by patient report and review of pharmacy fill history as appropriate Patient will call provider office for new concerns or questions as evidenced by review of documented incoming telephone call notes and patient report        Lazaro Arms RN, BSN, [facility] Care Management Coordinator [place] Phone: [phone] I Fax: [phone]

## 2021-06-23 NOTE — Progress Notes (Signed)
I, Wendy Poet, LAT, ATC, am serving as scribe for Dr. Lynne Leader.  Barbara Reese is a 55 y.o. female who presents to [place] at [facility] today for L foot pain likely due to gout.  She was last seen by Dr. Georgina Snell on 05/21/20 for R shoulder pain.  Today, pt reports L foot pain ongoing since Friday, 10/28. Pt notes she's had a prior episode of gout flare about 4 years ago.  She locates her pain to L Great toe and dorsum of her L foot. Pt notes increased pain at night.  Pain is consistent with prior episode of gout.  Swelling: yes Aggravating factors: TTT Treatments tried: IBU, tramadol, ice tramadol is not effective.  Diagnostic testing: L ankle MRI- 11/09/17; L foot XR- 09/15/17  Pertinent review of systems: No fevers or chills  Relevant historical information: Type 2 diabetes.  History of gout. Oxycodone caused nausea and vomiting previously.  Exam:  BP (!) 150/90   Pulse 74   Ht 5\' 6"  (1.676 m)   Wt 261 lb 12.8 oz (118.8 kg)   LMP 02/09/2016 (Exact Date)   SpO2 97%   BMI 42.26 kg/m  General: Well Developed, well nourished, and in no acute distress.   MSK: Left toe swelling at first MTP.  No erythema.  Tender palpation.  Decreased range of motion.    Lab and Radiology Results  X-ray images left foot obtained today personally and independently interpreted Mild degenerative changes first MTP.  Calcaneal heel spur.  No acute fractures. Await formal radiology review  Lab Results  Component Value Date   LABURIC 4.7 05/30/2014     Chemistry      Component Value Date/Time   NA 138 05/30/2021 2021   NA 138 02/01/2020 1319   K 3.4 (L) 05/30/2021 2021   CL 98 05/30/2021 2021   CO2 25 05/30/2021 2021   BUN 9 05/30/2021 2021   BUN 12 02/01/2020 1319   CREATININE 0.86 05/30/2021 2021   CREATININE 0.97 09/04/2016 0836      Component Value Date/Time   CALCIUM 9.9 05/30/2021 2021   ALKPHOS 49 05/30/2021 2021   AST 16 05/30/2021 2021   ALT 12  05/30/2021 2021   BILITOT 0.9 05/30/2021 2021   BILITOT 0.5 02/01/2020 1319         Assessment and Plan: 55 y.o. female with left great toe pain.  Patient states her pain is consistent with prior episode of gout.  Agree that pain is probably due to gout today.  Plan to treat with Toradol injection in clinic prior to discharge, trial of colchicine and ultimately allopurinol.  We will check uric acid and metabolic panel. For immediate pain control will use hydrocodone as tramadol is not effective and she has nausea with oxycodone.  Recheck in a month.  If not doing well certainly can prescribe prednisone or can do an injection but would like to avoid steroids if possible as this will cause hyperglycemia. Patient rates her pain as severe.  PDMP reviewed during this encounter. Orders Placed This Encounter  Procedures   DG Foot Complete Left    Standing Status:   Future    Number of Occurrences:   1    Standing Expiration Date:   06/23/2022    Order Specific Question:   Reason for Exam (SYMPTOM  OR DIAGNOSIS REQUIRED)    Answer:   left foot pain    Order Specific Question:   Preferred imaging location?  Answer:   [facility]    Order Specific Question:   Is patient pregnant?    Answer:   No   Uric acid    Standing Status:   Future    Number of Occurrences:   1    Standing Expiration Date:   01/74/9449   Basic metabolic panel    Standing Status:   Future    Number of Occurrences:   1    Standing Expiration Date:   06/23/2022   Meds ordered this encounter  Medications   HYDROcodone-acetaminophen (NORCO/VICODIN) 5-325 MG tablet    Sig: Take 1 tablet by mouth every 6 (six) hours as needed.    Dispense:  15 tablet    Refill:  0   colchicine 0.6 MG tablet    Sig: Take 1 tablet (0.6 mg total) by mouth daily as needed (gout or psuedogout pain).    Dispense:  30 tablet    Refill:  2   allopurinol (ZYLOPRIM) 300 MG tablet    Sig: Take 1 tablet (300 mg total) by mouth daily.     Dispense:  90 tablet    Refill:  3   ketorolac (TORADOL) injection 60 mg     Discussed warning signs or symptoms. Please see discharge instructions. Patient expresses understanding.   The above documentation has been reviewed and is accurate and complete Lynne Leader, M.D.

## 2021-06-23 NOTE — Patient Instructions (Addendum)
Thank you for coming in today.   You received a Toradol injection today. Seek immediate medical attention if the joint becomes red, extremely painful, or is oozing fluid.   Please get an Xray today before you leave   Please get labs today before you leave   Please go to [facility] supply to get the post-op shoe we talked about today. You may also be able to get it from [employer].    Please take the medications I sent into your pharmacy to treat for gout.  Recheck back 1 month

## 2021-06-23 NOTE — Progress Notes (Signed)
Uric acid is 7.2.  Plan to proceed with the medicines that I prescribed in clinic and on Monday the 31st including colchicine and allopurinol.  Recheck as scheduled on November 30.

## 2021-06-23 NOTE — Patient Instructions (Signed)
Visit Information  Ms. State  it was nice speaking with you. Please call me directly [phone] if you have questions about the goals we discussed.  Patient Goals/Self-Care Activities: Patient will self administer medications as prescribed as evidenced by self report/primary caregiver report  Patient will attend all scheduled provider appointments as evidenced by clinician review of documented attendance to scheduled appointments and patient/caregiver report Patient will call pharmacy for medication refills as evidenced by patient report and review of pharmacy fill history as appropriate Patient will call provider office for new concerns or questions as evidenced by review of documented incoming telephone call notes and patient report   The patient verbalized understanding of instructions, educational materials, and care plan provided today and declined offer to receive copy of patient instructions, educational materials, and care plan.   Follow up Plan: RNCM will follow up at the next scheduled Interval.  Lazaro Arms, [place]

## 2021-06-24 ENCOUNTER — Encounter: Payer: Self-pay | Admitting: Family Medicine

## 2021-06-24 NOTE — Progress Notes (Signed)
Left foot x-ray shows some heel spurs but does not show much going on with the big toe.

## 2021-06-26 ENCOUNTER — Other Ambulatory Visit (HOSPITAL_COMMUNITY): Payer: Self-pay

## 2021-06-26 MED ORDER — PREDNISONE 50 MG PO TABS
50.0000 mg | ORAL_TABLET | Freq: Every day | ORAL | 0 refills | Status: DC
  Filled 2021-06-26: qty 5, 5d supply, fill #0

## 2021-06-26 MED ORDER — OXYCODONE-ACETAMINOPHEN 10-325 MG PO TABS
1.0000 | ORAL_TABLET | Freq: Three times a day (TID) | ORAL | 0 refills | Status: DC | PRN
  Filled 2021-06-26: qty 15, 5d supply, fill #0

## 2021-06-26 NOTE — Addendum Note (Signed)
Addended by: Gregor Hams on: 06/26/2021 04:19 PM   Modules accepted: Orders

## 2021-07-01 ENCOUNTER — Encounter (HOSPITAL_COMMUNITY): Payer: Self-pay | Admitting: Radiology

## 2021-07-04 ENCOUNTER — Other Ambulatory Visit: Payer: Self-pay | Admitting: Family Medicine

## 2021-07-04 ENCOUNTER — Telehealth: Payer: 59

## 2021-07-04 DIAGNOSIS — G4733 Obstructive sleep apnea (adult) (pediatric): Secondary | ICD-10-CM

## 2021-07-08 ENCOUNTER — Other Ambulatory Visit: Payer: Self-pay | Admitting: Family Medicine

## 2021-07-09 ENCOUNTER — Other Ambulatory Visit (HOSPITAL_COMMUNITY): Payer: Self-pay

## 2021-07-09 ENCOUNTER — Telehealth: Payer: Self-pay | Admitting: Family Medicine

## 2021-07-09 DIAGNOSIS — R933 Abnormal findings on diagnostic imaging of other parts of digestive tract: Secondary | ICD-10-CM

## 2021-07-09 MED ORDER — FLUTICASONE PROPIONATE 50 MCG/ACT NA SUSP
2.0000 | Freq: Every day | NASAL | 5 refills | Status: DC | PRN
Start: 2021-07-09 — End: 2022-04-02
  Filled 2021-07-09 – 2021-10-15 (×2): qty 16, 30d supply, fill #0
  Filled 2022-01-06: qty 16, 30d supply, fill #1

## 2021-07-09 NOTE — Telephone Encounter (Signed)
Patient would like for Dr. [name] to call her as soon as possible. She received a letter in the mail concerning her CT scan done on 05/31/21. She states it says there was a "significant finding" and the letter was dated 07/01/21.  Please all patient to discus [phone].

## 2021-07-09 NOTE — Telephone Encounter (Signed)
Returned call to patient The CT was done in the ED on 10/8 while being evaluated for diverticulitis.  The item needing follow up is a repeat CT abdomen/pelvis with contrast to ensure resolution of focal findings on CT. Discussed with patient that I have low suspicion for colon cancer given her normal colonoscopy earlier this year, but the radiologist did recommend follow up imaging so I think we should obtain it.  She is agreeable. CT ordered. Red team, can you schedule it & contact patient with appointment? She prefers it be scheduled on a Wednesday.  Thanks [name], MD

## 2021-07-09 NOTE — Addendum Note (Signed)
Addended by: [name] on: 07/09/2021 02:48 PM   Modules accepted: Orders

## 2021-07-11 ENCOUNTER — Telehealth: Payer: Self-pay

## 2021-07-11 NOTE — Telephone Encounter (Signed)
Informed patient of CT appointment.  Patient states that she will need to reschedule because she is working on that day.  Patient was given the number to reschedule at her earliest convenience.  Barbara Reese, [place]

## 2021-07-14 ENCOUNTER — Ambulatory Visit (HOSPITAL_COMMUNITY)
Admission: RE | Admit: 2021-07-14 | Discharge: 2021-07-14 | Disposition: A | Discharge status: Home / Self Care | Payer: 59 | Source: Ambulatory Visit | Attending: Family Medicine | Admitting: Family Medicine

## 2021-07-14 ENCOUNTER — Ambulatory Visit (HOSPITAL_COMMUNITY): Payer: 59

## 2021-07-14 ENCOUNTER — Other Ambulatory Visit: Payer: Self-pay

## 2021-07-14 ENCOUNTER — Encounter (HOSPITAL_COMMUNITY): Payer: Self-pay

## 2021-07-14 DIAGNOSIS — K760 Fatty (change of) liver, not elsewhere classified: Secondary | ICD-10-CM | POA: Diagnosis not present

## 2021-07-14 DIAGNOSIS — R933 Abnormal findings on diagnostic imaging of other parts of digestive tract: Secondary | ICD-10-CM | POA: Diagnosis not present

## 2021-07-14 MED ORDER — IOHEXOL 350 MG/ML SOLN
75.0000 mL | Freq: Once | INTRAVENOUS | Status: AC | PRN
  Administered 2021-07-14: 75 mL via INTRAVENOUS

## 2021-07-14 MED ORDER — DIATRIZOATE MEGLUMINE & SODIUM 66-10 % PO SOLN
30.0000 mL | Freq: Once | ORAL | Status: AC
  Administered 2021-07-14: 30 mL via ORAL

## 2021-07-14 MED ORDER — SODIUM CHLORIDE (PF) 0.9 % IJ SOLN
INTRAMUSCULAR | Status: AC
  Filled 2021-07-14: qty 50

## 2021-07-14 MED ORDER — DIATRIZOATE MEGLUMINE & SODIUM 66-10 % PO SOLN
ORAL | Status: AC
  Filled 2021-07-14: qty 30

## 2021-07-16 ENCOUNTER — Ambulatory Visit: Payer: 59

## 2021-07-16 NOTE — Patient Instructions (Signed)
Visit Information  Barbara Reese  it was nice speaking with you. Please call me directly [phone] if you have questions about the goals we discussed.  Patient Goals/Self-Care Activities: Patient will self administer medications as prescribed as evidenced by self report/primary caregiver report  Patient will attend all scheduled provider appointments as evidenced by clinician review of documented attendance to scheduled appointments and patient/caregiver report Patient will call pharmacy for medication refills as evidenced by patient report and review of pharmacy fill history as appropriate Patient will call provider office for new concerns or questions as evidenced by review of documented incoming telephone call notes and patient report     The patient verbalized understanding of instructions, educational materials, and care plan provided today and declined offer to receive copy of patient instructions, educational materials, and care plan.   Follow up Plan: Patient would like continued follow-up.  CCM RNCM will outreach the patient within the next 3 weeks  Patient will call office if needed prior to next encounter  Lazaro Arms, RN  [phone]

## 2021-07-16 NOTE — Chronic Care Management (AMB) (Signed)
Chronic Care Management   CCM RN Visit Note  07/16/2021 Name: Barbara Reese MRN: [id number] DOB: 03-11-1966  Subjective: Barbara Reese is a 55 y.o. year old female who is a primary care patient of [name] [name], MD. The care management team was consulted for assistance with disease management and care coordination needs.    Engaged with patient by telephone for follow up visit in response to provider referral for case management and/or care coordination services.   Consent to Services:  The patient was given information about Chronic Care Management services, agreed to services, and gave verbal consent prior to initiation of services.  Please see initial visit note for detailed documentation.   Patient agreed to services and verbal consent obtained.    Assessment:  The patient is waiting for scheduling to make her appointment for the sleep lab.  They are running behind but will call her as soon as they can. . See Care Plan below for interventions and patient self-care actives. Follow up Plan: Patient would like continued follow-up.  CCM RNCM will outreach the patient within the next 3 weeks.  Patient will call office if needed prior to next encounter  Review of patient past medical history, allergies, medications, health status, including review of consultants reports, laboratory and other test data, was performed as part of comprehensive evaluation and provision of chronic care management services.   SDOH (Social Determinants of Health) assessments and interventions performed:    CCM Care Plan  Allergies  Allergen Reactions   Gabapentin Rash    Itchy rash    Outpatient Encounter Medications as of 07/16/2021  Medication Sig   allopurinol (ZYLOPRIM) 300 MG tablet Take 1 tablet (300 mg total) by mouth daily.   amoxicillin-clavulanate (AUGMENTIN) 875-125 MG tablet Take 1 tablet by mouth 2 (two) times daily for 7 days   cetirizine (ZYRTEC) 10 MG tablet Take 10 mg by  mouth daily as needed for allergies.   clobetasol cream (TEMOVATE) 0.05 % APPLY TO THE AFFECTED AREA(S) TOPICALLY DAILY AS NEEDED.   colchicine 0.6 MG tablet Take 1 tablet (0.6 mg total) by mouth daily as needed (gout or psuedogout pain).   cyclobenzaprine (FLEXERIL) 10 MG tablet Take 1 tablet (10 mg total) by mouth 3 (three) times daily as needed for muscle spasms.   docusate sodium (COLACE) 100 MG capsule Take 1 capsule (100 mg total) by mouth every 12 (twelve) hours.   fluticasone (FLONASE) 50 MCG/ACT nasal spray Place 2 sprays into both nostrils daily as needed for allergies   hydrochlorothiazide (HYDRODIURIL) 25 MG tablet Take 1 tablet (25 mg total) by mouth daily.   ibuprofen (ADVIL,MOTRIN) 200 MG tablet Take 600 mg by mouth 2 (two) times daily as needed for headache or moderate pain.   Multiple Vitamin (MULTIVITAMIN WITH MINERALS) TABS tablet Take 1 tablet by mouth daily.   ondansetron (ZOFRAN ODT) 4 MG disintegrating tablet 4mg  ODT q4 hours prn nausea/vomit   oxyCODONE-acetaminophen (PERCOCET) 10-325 MG tablet Take 1 tablet by mouth every 8 (eight) hours as needed for pain.   pantoprazole (PROTONIX) 40 MG tablet Take 1 tablet (40 mg total) by mouth daily.   predniSONE (DELTASONE) 50 MG tablet Take 1 tablet (50 mg total) by mouth daily.   rosuvastatin (CRESTOR) 5 MG tablet Take 1 tablet (5 mg total) by mouth daily.   traMADol (ULTRAM) 50 MG tablet Take 1 tablet (50 mg total) by mouth every 8 (eight) hours as needed for severe pain.   No facility-administered encounter  medications on file as of 07/16/2021.    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   Viral respiratory illness 11/02/2018   Acute bacterial middle ear infection, right 05/12/2018   Infective otitis externa of right ear  05/12/2018   Eustachian tube disorder 04/27/2018   Type 2 diabetes mellitus ([place]) 03/09/2018   Retrocalcaneal bursitis (back of heel), left 07/01/2017   Degenerative arthritis of left knee 03/13/2016   Lumbar radiculopathy 03/13/2016   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   Trigger thumb of right hand 01/02/2016   Situational anxiety 10/08/2014   Rectal bleeding 09/18/2014   Gout of big toe 05/23/2014   Derang of medial meniscus due to old tear/inj, unsp knee 05/17/2014   Arthritis of knee, degenerative 10/26/2013   Hypertension 12/24/2012   Physical examination of employee 12/01/2012   Arthritis 12/01/2012   Elevated LDL cholesterol level 12/16/2011   Prediabetes 12/16/2011   Depression 16/05/9603   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   ANXIETY DISORDER 11/06/2009   OBESITY, MODERATE 01/04/2009   OBSTRUCTIVE SLEEP APNEA 12/28/2008   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   GERD 02/01/2008    Conditions to be addressed/monitored: CPAP  Care Plan : RN Case Manager  Updates made by Lazaro Arms, RN since 07/16/2021 12:00 AM     Problem: (COPD)      Goal: Patient will be able to obtain a CPAP to manage her obstructive sleep apnea   Start Date: 06/03/2021  Expected End Date: 08/22/2021  Priority: High  Note:   Current Barriers:  Care Coordination needs related to Financial constraints related to obtaining a CPAP iin a patient with Obstructive sleep apnea  RNCM Clinical Goal(s):  Patient will verbalize understanding of plan for management of Obstructive sleep apnea  through collaboration with RN Care manager, provider, and care team.   Interventions: 1:1 collaboration with primary care provider regarding development and update of comprehensive plan of care as evidenced by provider attestation and co-signature Inter-disciplinary care team collaboration (see longitudinal plan of care) Evaluation of current treatment plan related to  self  management and patient's adherence to plan as established by provider    06/03/2021  (Status: New goal.) Short term Evaluation of current treatment plan related to  Financial constraints related to CPAP for Obstructive sleep apnea  self-management and patient's adherence to plan as established by provider. Discussed plans with patient for ongoing care management follow up and provided patient with direct contact information for care management team Advised patient That I will contact [employer] provider to seek information regarding CPAP Reviewed medications with patient and discussed  RNCM did research calling discount DME companies for the patient obtaiing information- -Product/process development scientist ( spoke with Jeannene Patella )if purchasing on her own the patient would need an order and she would be paying 1200 dollars out of pocket.  The patient stated that she has tried [facility]. Patient states that she will only be able to afford 200 dollars out of pocket for a CPAP machine.She said that her last sleep study was about 2 years ago 07/16/21: RNCM spoke with the referral coordinator regarding the the order placed for the sleep study at [facility].  She has not heard any information from them but was able to provide me with their contact information  [address], [place], [place] [address] Phone: [phone]. RNCM called the patient to see if she has been contact by the sleep lab.  She said that she has not but wanted them to know that the only day day she will be available to do the study would be on a Wednesday when she is off.  RNCM called the lab and spoke with the scheduler and she stated that they are about two weeks behind.  I gave her the patient's name and DOB and asked that when she scheduled her if it could be on a Wednesday and she said that it would be fine.  I notified Mrs. Milberger and she appreciated me following up for her.   Patient Goals/Self-Care Activities: Patient will self administer  medications as prescribed as evidenced by self report/primary caregiver report  Patient will attend all scheduled provider appointments as evidenced by clinician review of documented attendance to scheduled appointments and patient/caregiver report Patient will call pharmacy for medication refills as evidenced by patient report and review of pharmacy fill history as appropriate Patient will call provider office for new concerns or questions as evidenced by review of documented incoming telephone call notes and patient report       Lazaro Arms RN, BSN, [facility] Care Management Coordinator [place] Phone: [phone] I Fax: [phone]

## 2021-07-18 ENCOUNTER — Other Ambulatory Visit (HOSPITAL_COMMUNITY): Payer: 59

## 2021-07-21 ENCOUNTER — Other Ambulatory Visit (HOSPITAL_COMMUNITY): Payer: Self-pay

## 2021-07-22 ENCOUNTER — Other Ambulatory Visit (HOSPITAL_COMMUNITY): Payer: Self-pay

## 2021-07-22 ENCOUNTER — Telehealth: Payer: Self-pay | Admitting: Family Medicine

## 2021-07-22 MED ORDER — OXYCODONE-ACETAMINOPHEN 10-325 MG PO TABS
1.0000 | ORAL_TABLET | Freq: Three times a day (TID) | ORAL | 0 refills | Status: DC | PRN
  Filled 2021-07-22: qty 15, 5d supply, fill #0

## 2021-07-22 NOTE — Addendum Note (Signed)
Addended by: Gregor Hams on: 07/22/2021 03:28 PM   Modules accepted: Orders

## 2021-07-22 NOTE — Telephone Encounter (Signed)
Patient called requesting a refill on her oxyCODONE-acetaminophen (PERCOCET) 10-325 MG tablet to be sent to the [facility].  Please advise. (Patient is scheduled for Wed. 07/30/2021)

## 2021-07-22 NOTE — Telephone Encounter (Signed)
Patient called back following up on this request. She states that she is completely out of this medication.

## 2021-07-22 NOTE — Progress Notes (Deleted)
   I, Wendy Poet, LAT, ATC, am serving as scribe for Dr. Lynne Leader.  Barbara Reese is a 55 y.o. female who presents to [address] [facility] today for f/u of L dorsal foot and L great toe pain most likely due to a gout flare.  She was last seen by Dr. Georgina Snell on 06/23/21 and had a Toradol injection for pain.  She was also prescribed colchicine, allopurinol and hydrocodone-acetaminophen and was advised to purchase a post-op shoe.  Today, pt reports   Diagnostic testing: L foot XR- 06/23/21; uric acid lab- 06/23/21  Pertinent review of systems: ***  Relevant historical information: ***   Exam:  LMP 02/09/2016 (Exact Date)  General: Well Developed, well nourished, and in no acute distress.   MSK: ***    Lab and Radiology Results No results found for this or any previous visit (from the past 72 hour(s)). No results found.     Assessment and Plan: 55 y.o. female with ***   PDMP not reviewed this encounter. No orders of the defined types were placed in this encounter.  No orders of the defined types were placed in this encounter.    Discussed warning signs or symptoms. Please see discharge instructions. Patient expresses understanding.   ***

## 2021-07-22 NOTE — Telephone Encounter (Signed)
Medications refilled

## 2021-07-23 ENCOUNTER — Ambulatory Visit: Payer: 59 | Admitting: Family Medicine

## 2021-07-25 ENCOUNTER — Telehealth: Payer: Self-pay | Admitting: *Deleted

## 2021-07-25 NOTE — Telephone Encounter (Signed)
Pt called wanting to know the status of her FMLA paperwork. Please advise. Gaspare Netzel Kennon Holter, CMA

## 2021-07-27 NOTE — Telephone Encounter (Signed)
Reviewed, completed, and signed form.  Note routed to RN team inbasket and placed completed form in Clinic RN's office (wall pocket above desk).  Please make a copy before faxing to keep in my box in case of error.  Martyn Malay, MD

## 2021-07-30 ENCOUNTER — Ambulatory Visit: Payer: 59 | Admitting: Family Medicine

## 2021-08-06 ENCOUNTER — Telehealth: Payer: Self-pay

## 2021-08-06 ENCOUNTER — Telehealth: Payer: 59

## 2021-08-06 NOTE — Telephone Encounter (Signed)
° °  RN Case Manager Care Management   Phone Outreach    08/06/2021 Name: EUN VERMEER MRN: [id number] DOB: 09-26-1965  [name] is a 55 y.o. year old female who is a primary care patient of [name] [name], MD .   Telephone outreach was unsuccessful A HIPPA compliant phone message was left for the patient providing contact information and requesting a return call.   Follow Up Plan: Will route chart to Care Guide to see if patient would like to reschedule phone appointment    Review of patient status, including review of consultants reports, relevant laboratory and other test results, and collaboration with appropriate care team members and the patient's provider was performed as part of comprehensive patient evaluation and provision of care management services.    Lazaro Arms RN, BSN, [facility] Care Management Coordinator [place] Phone: [phone] Fax: [phone]

## 2021-08-07 NOTE — Telephone Encounter (Signed)
Rescheduled 12/22

## 2021-08-13 ENCOUNTER — Ambulatory Visit: Payer: 59 | Admitting: Family Medicine

## 2021-08-13 NOTE — Progress Notes (Deleted)
° °  I, Peterson Lombard, LAT, ATC acting as a scribe for Lynne Leader, MD.  Barbara Reese is a 55 y.o. female who presents to [place] at [facility] today for L foot pain and L great toe pain. Pt was last seen by Dr. Georgina Snell on 06/23/21 and was given an IM Toradol injection and prescribed hydrocodone, allopurinol, and colchicine. Pt was supposed to have a f/u visit on 07/23/21, but had to cancel that visit as well as the visit that was scheduled for 07/30/21. Today, pt reports  Dx testing: 06/23/21 Labs (uric acid & basic metabolic panel)  93/73/42 L foot XR  11/09/17 L ankle MRI  09/15/17 L foot XR  Pertinent review of systems: ***  Relevant historical information: ***   Exam:  LMP 02/09/2016 (Exact Date)  General: Well Developed, well nourished, and in no acute distress.   MSK: ***    Lab and Radiology Results No results found for this or any previous visit (from the past 72 hour(s)). No results found.     Assessment and Plan: 56 y.o. female with ***   PDMP not reviewed this encounter. No orders of the defined types were placed in this encounter.  No orders of the defined types were placed in this encounter.    Discussed warning signs or symptoms. Please see discharge instructions. Patient expresses understanding.   ***

## 2021-08-14 ENCOUNTER — Other Ambulatory Visit (HOSPITAL_COMMUNITY): Payer: Self-pay

## 2021-08-14 ENCOUNTER — Ambulatory Visit: Payer: 59

## 2021-08-14 NOTE — Chronic Care Management (AMB) (Signed)
Chronic Care Management   CCM RN Visit Note  08/14/2021 Name: Barbara Reese MRN: [id number] DOB: [birth year]  Subjective: Barbara Reese is a 55 y.o. year old female who is a primary care patient of [name] [name], MD. The care management team was consulted for assistance with disease management and care coordination needs.    Engaged with patient by telephone for follow up visit in response to provider referral for case management and/or care coordination services.   Consent to Services:  The patient was given information about Chronic Care Management services, agreed to services, and gave verbal consent prior to initiation of services.  Please see initial visit note for detailed documentation.   Patient agreed to services and verbal consent obtained.    Assessment:  The patient is still waiting for ana appointment to have a sleep study . See Care Plan below for interventions and patient self-care actives. Follow up Plan: Patient would like continued follow-up.  CCM RNCM will outreach the patient within the next 3 weeks.  Patient will call office if needed prior to next encounter  Review of patient past medical history, allergies, medications, health status, including review of consultants reports, laboratory and other test data, was performed as part of comprehensive evaluation and provision of chronic care management services.   SDOH (Social Determinants of Health) assessments and interventions performed:    CCM Care Plan  Allergies  Allergen Reactions   Gabapentin Rash    Itchy rash    Outpatient Encounter Medications as of 08/14/2021  Medication Sig   allopurinol (ZYLOPRIM) 300 MG tablet Take 1 tablet (300 mg total) by mouth daily.   amoxicillin-clavulanate (AUGMENTIN) 875-125 MG tablet Take 1 tablet by mouth 2 (two) times daily for 7 days   cetirizine (ZYRTEC) 10 MG tablet Take 10 mg by mouth daily as needed for allergies.   clobetasol cream (TEMOVATE) 0.05 %  APPLY TO THE AFFECTED AREA(S) TOPICALLY DAILY AS NEEDED.   colchicine 0.6 MG tablet Take 1 tablet (0.6 mg total) by mouth daily as needed (gout or psuedogout pain).   cyclobenzaprine (FLEXERIL) 10 MG tablet Take 1 tablet (10 mg total) by mouth 3 (three) times daily as needed for muscle spasms.   docusate sodium (COLACE) 100 MG capsule Take 1 capsule (100 mg total) by mouth every 12 (twelve) hours.   fluticasone (FLONASE) 50 MCG/ACT nasal spray Place 2 sprays into both nostrils daily as needed for allergies   hydrochlorothiazide (HYDRODIURIL) 25 MG tablet Take 1 tablet (25 mg total) by mouth daily.   ibuprofen (ADVIL,MOTRIN) 200 MG tablet Take 600 mg by mouth 2 (two) times daily as needed for headache or moderate pain.   Multiple Vitamin (MULTIVITAMIN WITH MINERALS) TABS tablet Take 1 tablet by mouth daily.   ondansetron (ZOFRAN ODT) 4 MG disintegrating tablet 4mg  ODT q4 hours prn nausea/vomit   oxyCODONE-acetaminophen (PERCOCET) 10-325 MG tablet Take 1 tablet by mouth every 8 (eight) hours as needed for pain.   pantoprazole (PROTONIX) 40 MG tablet Take 1 tablet (40 mg total) by mouth daily.   predniSONE (DELTASONE) 50 MG tablet Take 1 tablet (50 mg total) by mouth daily.   rosuvastatin (CRESTOR) 5 MG tablet Take 1 tablet (5 mg total) by mouth daily.   traMADol (ULTRAM) 50 MG tablet Take 1 tablet (50 mg total) by mouth every 8 (eight) hours as needed for severe pain.   No facility-administered encounter medications on file as of 08/14/2021.    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   Viral respiratory illness 11/02/2018   Acute bacterial middle ear infection, right 05/12/2018   Infective otitis externa of right ear 05/12/2018   Eustachian tube disorder 04/27/2018   Type 2 diabetes  mellitus ([place]) 03/09/2018   Retrocalcaneal bursitis (back of heel), left 07/01/2017   Degenerative arthritis of left knee 03/13/2016   Lumbar radiculopathy 03/13/2016   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   Trigger thumb of right hand 01/02/2016   Situational anxiety 10/08/2014   Rectal bleeding 09/18/2014   Gout of big toe 05/23/2014   Derang of medial meniscus due to old tear/inj, unsp knee 05/17/2014   Arthritis of knee, degenerative 10/26/2013   Hypertension 12/24/2012   Physical examination of employee 12/01/2012   Arthritis 12/01/2012   Elevated LDL cholesterol level 12/16/2011   Prediabetes 12/16/2011   Depression 11/94/1740   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   ANXIETY DISORDER 11/06/2009   OBESITY, MODERATE 01/04/2009   OBSTRUCTIVE SLEEP APNEA 12/28/2008   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   GERD 02/01/2008    Conditions to be addressed/monitored: CPAP  Care Plan : RN Case Manager  Updates made by Lazaro Arms, RN since 08/14/2021 12:00 AM     Problem: (COPD)      Goal: Patient will be able to obtain a CPAP to manage her obstructive sleep apnea   Start Date: 06/03/2021  Expected End Date: 09/23/2021  Priority: High  Note:   Current Barriers:  Care Coordination needs related to Financial constraints related to obtaining a CPAP iin a patient with Obstructive sleep apnea  RNCM Clinical Goal(s):  Patient will verbalize understanding of plan for management of Obstructive sleep apnea  through collaboration with RN Care manager, provider, and care team.   Interventions: 1:1 collaboration with primary care provider regarding development and update of comprehensive plan of care as evidenced by provider attestation and co-signature Inter-disciplinary care team collaboration (see longitudinal plan of care) Evaluation of current treatment plan related to  self management and patient's adherence to plan as established by  provider    06/03/2021  (Status: Goal on Track (progressing): YES.) Short term Evaluation of current treatment plan related to  Financial constraints related to CPAP for Obstructive sleep apnea  self-management and patient's adherence to plan as established by provider. Discussed plans with patient for ongoing care management follow up and provided patient with direct contact information for care management team Patient states that she will only be able to afford 200 dollars out of pocket for a CPAP machine.She said that her last sleep study was about 2 years ago 08/13/21: I spoke with the patient, and she has yet to hear from the sleep lab at [facility]. CM RN called the lab and talked to [place]; she looked for the referral. I connected with the referral coordinator at [facility] to confirm when the referral was sent in epic; it had been sent on 07/04/21. Sharee Pimple stated that she needed to call me back. Sharee Pimple returned the call and told me that she called the patient and left a message with contact information. Tell her they have an opening for tomorrow night if she is available.  Patient Goals/Self Care Activities: -Patient/Caregiver will self-administer medications as prescribed as evidenced by self-report/primary caregiver report  -Patient/Caregiver will attend all scheduled provider  appointments as evidenced by clinician review of documented attendance to scheduled appointments and patient/caregiver report -Patient/Caregiver will call pharmacy for medication refills as evidenced by patient report and review of pharmacy fill history as appropriate -Patient/Caregiver will call provider office for new concerns or questions as evidenced by review of documented incoming telephone call notes and patient report -Patient/Caregiver verbalizes understanding of plan -Patient/Caregiver will focus on medication adherence by taking medications as prescribed      Lazaro Arms RN, BSN, [place] Phone: [phone] I Fax: [phone]

## 2021-08-14 NOTE — Patient Instructions (Signed)
Visit Information  Ms. Scripter  it was nice speaking with you. Please call me directly [phone] if you have questions about the goals we discussed.  Patient Goals/Self Care Activities: -Patient/Caregiver will self-administer medications as prescribed as evidenced by self-report/primary caregiver report  -Patient/Caregiver will attend all scheduled provider appointments as evidenced by clinician review of documented attendance to scheduled appointments and patient/caregiver report -Patient/Caregiver will call pharmacy for medication refills as evidenced by patient report and review of pharmacy fill history as appropriate -Patient/Caregiver will call provider office for new concerns or questions as evidenced by review of documented incoming telephone call notes and patient report -Patient/Caregiver verbalizes understanding of plan -Patient/Caregiver will focus on medication adherence by taking medications as prescribed      The patient verbalized understanding of instructions, educational materials, and care plan provided today and declined offer to receive copy of patient instructions, educational materials, and care plan.   Follow up Plan: Patient would like continued follow-up.  CCM RNCM will outreach the patient within the next 3 weeks.  Patient will call office if needed prior to next encounter  Lazaro Arms, RN  [phone]

## 2021-09-17 ENCOUNTER — Encounter: Payer: 59 | Admitting: Family Medicine

## 2021-10-15 ENCOUNTER — Other Ambulatory Visit (HOSPITAL_COMMUNITY): Payer: Self-pay

## 2021-10-28 ENCOUNTER — Encounter: Payer: 59 | Admitting: Family Medicine

## 2021-11-04 ENCOUNTER — Other Ambulatory Visit (HOSPITAL_COMMUNITY): Payer: Self-pay

## 2021-11-04 ENCOUNTER — Ambulatory Visit (INDEPENDENT_AMBULATORY_CARE_PROVIDER_SITE_OTHER): Payer: 59

## 2021-11-04 ENCOUNTER — Encounter: Payer: Self-pay | Admitting: Family Medicine

## 2021-11-04 ENCOUNTER — Other Ambulatory Visit: Payer: Self-pay

## 2021-11-04 ENCOUNTER — Ambulatory Visit (INDEPENDENT_AMBULATORY_CARE_PROVIDER_SITE_OTHER): Payer: 59 | Admitting: Family Medicine

## 2021-11-04 VITALS — BP 140/93 | HR 87 | Ht 66.0 in | Wt 258.6 lb

## 2021-11-04 VITALS — BP 140/93 | HR 87 | Ht 66.0 in | Wt 257.8 lb

## 2021-11-04 DIAGNOSIS — M170 Bilateral primary osteoarthritis of knee: Secondary | ICD-10-CM

## 2021-11-04 DIAGNOSIS — M109 Gout, unspecified: Secondary | ICD-10-CM

## 2021-11-04 DIAGNOSIS — E785 Hyperlipidemia, unspecified: Secondary | ICD-10-CM

## 2021-11-04 DIAGNOSIS — I10 Essential (primary) hypertension: Secondary | ICD-10-CM

## 2021-11-04 DIAGNOSIS — L430 Hypertrophic lichen planus: Secondary | ICD-10-CM | POA: Diagnosis not present

## 2021-11-04 DIAGNOSIS — E119 Type 2 diabetes mellitus without complications: Secondary | ICD-10-CM

## 2021-11-04 DIAGNOSIS — M79675 Pain in left toe(s): Secondary | ICD-10-CM | POA: Diagnosis not present

## 2021-11-04 LAB — POCT GLYCOSYLATED HEMOGLOBIN (HGB A1C): HbA1c, POC (controlled diabetic range): 7.6 % — AB (ref 0.0–7.0)

## 2021-11-04 MED ORDER — AMLODIPINE BESYLATE 10 MG PO TABS
10.0000 mg | ORAL_TABLET | Freq: Every day | ORAL | 3 refills | Status: DC
  Filled 2021-11-04: qty 90, 90d supply, fill #0

## 2021-11-04 MED ORDER — COLCHICINE 0.6 MG PO TABS
0.6000 mg | ORAL_TABLET | Freq: Every day | ORAL | 2 refills | Status: DC | PRN
Start: 2021-11-04 — End: 2023-07-13
  Filled 2021-11-04: qty 30, 30d supply, fill #0

## 2021-11-04 MED ORDER — OXYCODONE-ACETAMINOPHEN 10-325 MG PO TABS
1.0000 | ORAL_TABLET | Freq: Three times a day (TID) | ORAL | 0 refills | Status: DC | PRN
  Filled 2021-11-04: qty 15, 5d supply, fill #0

## 2021-11-04 MED ORDER — ALLOPURINOL 300 MG PO TABS
300.0000 mg | ORAL_TABLET | Freq: Every day | ORAL | 3 refills | Status: DC
  Filled 2021-11-04: qty 90, 90d supply, fill #0

## 2021-11-04 MED ORDER — SHINGRIX 50 MCG/0.5ML IM SUSR: 0.5000 mL | Freq: Once | INTRAMUSCULAR | 1 refills | Status: AC

## 2021-11-04 NOTE — Assessment & Plan Note (Signed)
Stop HCTZ, changing instead to amlodipine. Continue allopurinol. Recommend trial of colchicine 0.'6mg'$  daily to see if it helps control symptoms. Update uric acid level today. ?

## 2021-11-04 NOTE — Patient Instructions (Addendum)
Thank you for coming in today.  ? ?Please get an Xray today before you leave  ? ?Use the CAM walker boot ? ?I've sent a prescription for colchicine, allopurinol, and oxycodone to your pharmacy.  ? ?Recheck back in 2 weeks. ?

## 2021-11-04 NOTE — Assessment & Plan Note (Signed)
Has upcoming appointment with dermatology in May for increased # of spots on legs ?Continue as needed clobetasol until then ?

## 2021-11-04 NOTE — Assessment & Plan Note (Signed)
Change HCTZ to amlodipine in light of gout flare and ongoing pain in toes ?To monitor blood pressure at home, if persistently 140/90 or higher she will let me know so we can make further adjustments ?

## 2021-11-04 NOTE — Assessment & Plan Note (Signed)
Update lipids today, continue statin, titrate pending results ?

## 2021-11-04 NOTE — Progress Notes (Signed)
bival  ?Date of Visit: 11/04/2021  ? ?SUBJECTIVE:  ? ?HPI: ? ?Barbara Reese presents today for routine follow up.  ? ?L foot - worried about 2 toes where she is having a lot of pain. Saw Dr. Georgina Snell, thought gout. Got rx for colchicine and allopurinol, along with oxycodone for pain. Gets throbbing pain, also numbness, tingling of those 2 toes. Current dose is allopurinol '300mg'$  daily. Takes colchicine as needed, not daily. Pain in first & second toe of L foot. ? ?Pain in knees and low back. Takes tramadol occasionally, about once a month. ? ?Has derm appointment scheduled for lower legs. Appointment is in May. Using clobetasol occasionally. Has noted increase in hyperpigmented areas of lower legs. ? ?Hypertension - checks blood pressure at home and typically gets 140/80. Currently on HCTZ '25mg'$  daily. ? ?OBJECTIVE:  ? ?BP (!) 140/93   Pulse 87   Ht '5\' 6"'$  (1.676 m)   Wt 257 lb 12.8 oz (116.9 kg)   LMP 02/09/2016 (Exact Date)   SpO2 99%   BMI 41.61 kg/m?  ?Recheck 140/93. ?Gen: no acute distress pleasant cooperative ?HEENT: normocephalic, atraumatic  ?Heart: regular rate and rhythm, no murmur ?Lungs: clear to auscultation bilaterally normal work of breathing  ?Neuro: alert, speech normal ?Ext: 2+dp pulses bilaterally. Decreased sensation over left forefoot plantar aspect, otherwise normal. No lesions or swelling of feet. No warmth ?+sparse hyperpigmented macules on bilateral lower exts ? ?ASSESSMENT/PLAN:  ? ?Health maintenance:  ?-eye appointment scheduled [facility] 3/27 ?-foot exam done today, decreased sensation L foot, advised checking feet daily ?-recommend bivalent COVID booster, patient still contemplating ?-rx given for shingles vaccine ?-urine microalbumin collected today ? ?Hyperlipidemia ?Update lipids today, continue statin, titrate pending results ? ?Gout of big toe ?Stop HCTZ, changing instead to amlodipine. Continue allopurinol. Recommend trial of colchicine 0.'6mg'$  daily to see if it helps control symptoms.  Update uric acid level today. ? ?Type 2 diabetes mellitus ([place]) ?A1c elevated today at 7.6, currently not on any diabetes medications. Discussed GLP-1 agonist. She is potentially interested and will schedule with Dr. Valentina Lucks to discuss more. ? ?Hypertension ?Change HCTZ to amlodipine in light of gout flare and ongoing pain in toes ?To monitor blood pressure at home, if persistently 140/90 or higher she will let me know so we can make further adjustments ? ?Arthritis of knee, degenerative ?Endorses ongoing pain in knees and back. Recommend follow up with sports medicine Dr. Georgina Snell for this ?I am fine with continuing to prescribe her as needed tramadol, using about once per month ?Discussed long term narcotics like oxycodone are not a good option ? ?Lichen planus hypertrophicus ?Has upcoming appointment with dermatology in May for increased # of spots on legs ?Continue as needed clobetasol until then ? ?FOLLOW UP: ?Follow up in 64mo for next A1c ?Schedule with sports medicine ?Schedule with pharmacy clinic to discuss GLP1 ? ?BGrady MArdelia Mems MD ?[facility] ?

## 2021-11-04 NOTE — Progress Notes (Signed)
? ?I, Peterson Lombard, LAT, ATC acting as a scribe for Lynne Leader, MD. ? ?Barbara Reese is a 56 y.o. female who presents to [facility] today for f/u L Great toe pain. Pt was last seen by Dr. Georgina Snell on 06/23/21 and was given a Toradol IM injection and was prescribed allopurinol, colchicine, and oxycodone. Today, pt reports L Great toe has not improved. Pt c/o "throbbing" pain esp at night. Pt is out of colchicine. ?She notes that she has only been taking the allopurinol intermittently as she was not clear how to take it. ? ?Dx testing: 11/04/21 Labs ?06/23/21 Labs  ?06/23/21 L foot XR ? ?Pertinent review of systems: No fevers or chills ? ?Relevant historical information: Gout and diabetes ? ? ?Exam:  ?BP (!) 140/93   Pulse 87   Ht '5\' 6"'$  (1.676 m)   Wt 258 lb 9.6 oz (117.3 kg)   LMP 02/09/2016 (Exact Date)   SpO2 99%   BMI 41.74 kg/m?  ?General: Well Developed, well nourished, and in no acute distress.  ? ?MSK: Left toe slight swelling at MTP.  Mildly tender palpation. ?Palpable pop with toe motion. ? ? ? ?Lab and Radiology Results ? ?Xray images left foot obtained today personally and independently interpreted ?Avulsion fragment present at lateral aspect of first MTP.  This is a new change compared to her prior x-ray October 2022.  The avulsion fragment appears to be rounded and not new. ?Await formal radiology review ? ? ?Lab Results  ?Component Value Date  ? LABURIC 7.2 (H) 06/23/2021  ? ? ? ? ? ?Assessment and Plan: ?56 y.o. female with left great toe pain.  Suspect gout based on her prior history although this is not entirely clear today.  Plan to treat with oxycodone for pain control and refill of her colchicine which she is out of and her allopurinol which has not been taking normally. ?Additionally recommend CAM Walker boot.  Recheck in 2 weeks.  Consider steroid injection at that time. ?X-ray today. ? ? ?PDMP reviewed during this encounter. ?Orders Placed This  Encounter  ?Procedures  ? DG Foot Complete Left  ?  Standing Status:   Future  ?  Number of Occurrences:   1  ?  Standing Expiration Date:   11/05/2022  ?  Order Specific Question:   Reason for Exam (SYMPTOM  OR DIAGNOSIS REQUIRED)  ?  Answer:   left foot pain  ?  Order Specific Question:   Preferred imaging location?  ?  Answer:   [facility]  ?  Order Specific Question:   Is patient pregnant?  ?  Answer:   No  ? ?Meds ordered this encounter  ?Medications  ? oxyCODONE-acetaminophen (PERCOCET) 10-325 MG tablet  ?  Sig: Take 1 tablet by mouth every 8 (eight) hours as needed for pain.  ?  Dispense:  15 tablet  ?  Refill:  0  ? colchicine 0.6 MG tablet  ?  Sig: Take 1 tablet (0.6 mg total) by mouth daily as needed (gout or psuedogout pain).  ?  Dispense:  30 tablet  ?  Refill:  2  ? allopurinol (ZYLOPRIM) 300 MG tablet  ?  Sig: Take 1 tablet (300 mg total) by mouth daily.  ?  Dispense:  90 tablet  ?  Refill:  3  ? ? ? ?Discussed warning signs or symptoms. Please see discharge instructions. Patient expresses understanding. ? ? ?The above documentation has been reviewed and  is accurate and complete Lynne Leader, M.D. ? ? ?

## 2021-11-04 NOTE — Patient Instructions (Addendum)
Check feet every day for cuts, signs of infection ? ?Stop HCTZ ?Switch to amlodipine '10mg'$  daily ? ?Checking labs today - cholesterol, uric acid (gout), urine protein ?Try taking colchicine daily to see if it helps with your foot ?Schedule an appointment with Dr. Georgina Snell ? ?Schedule with pharmacy clinic here at [facility] to discuss starting ozempic ? ?Take shingles vaccine prescription to your pharmacy ? ?Follow up with me in 3 months to recheck A1c ? ?Be well, ?Dr. [name]  ?

## 2021-11-04 NOTE — Assessment & Plan Note (Signed)
Endorses ongoing pain in knees and back. Recommend follow up with sports medicine Dr. Georgina Snell for this ?I am fine with continuing to prescribe her as needed tramadol, using about once per month ?Discussed long term narcotics like oxycodone are not a good option ?

## 2021-11-04 NOTE — Assessment & Plan Note (Signed)
A1c elevated today at 7.6, currently not on any diabetes medications. Discussed GLP-1 agonist. She is potentially interested and will schedule with Dr. Valentina Lucks to discuss more. ?

## 2021-11-05 ENCOUNTER — Encounter: Payer: Self-pay | Admitting: Family Medicine

## 2021-11-05 ENCOUNTER — Other Ambulatory Visit (HOSPITAL_COMMUNITY): Payer: Self-pay

## 2021-11-05 LAB — MICROALBUMIN / CREATININE URINE RATIO
Creatinine, Urine: 273.5 mg/dL
Microalb/Creat Ratio: 8 mg/g creat (ref 0–29)
Microalbumin, Urine: 22.5 ug/mL

## 2021-11-05 LAB — LIPID PANEL
Chol/HDL Ratio: 3.4 ratio (ref 0.0–4.4)
Cholesterol, Total: 226 mg/dL — ABNORMAL HIGH (ref 100–199)
HDL: 67 mg/dL (ref >39)
LDL Chol Calc (NIH): 143 mg/dL — ABNORMAL HIGH (ref 0–99)
Triglycerides: 94 mg/dL (ref 0–149)
VLDL Cholesterol Cal: 16 mg/dL (ref 5–40)

## 2021-11-05 LAB — URIC ACID: Uric Acid: 7.7 mg/dL — ABNORMAL HIGH (ref 3.0–7.2)

## 2021-11-05 MED ORDER — PRAVASTATIN SODIUM 40 MG PO TABS
40.0000 mg | ORAL_TABLET | Freq: Every day | ORAL | 3 refills | Status: DC
  Filled 2021-11-05: qty 90, 90d supply, fill #0
  Filled 2022-02-07: qty 90, 90d supply, fill #1
  Filled 2022-05-05: qty 90, 90d supply, fill #2
  Filled 2022-07-31: qty 90, 90d supply, fill #3

## 2021-11-06 NOTE — Progress Notes (Signed)
Left big toe xray does not show any new findings. It looks like you hurt it in the past but it was a long time ago.

## 2021-11-09 ENCOUNTER — Encounter: Payer: Self-pay | Admitting: Family Medicine

## 2021-11-10 ENCOUNTER — Other Ambulatory Visit (HOSPITAL_COMMUNITY): Payer: Self-pay

## 2021-11-10 ENCOUNTER — Encounter: Payer: Self-pay | Admitting: Family Medicine

## 2021-11-10 NOTE — Telephone Encounter (Signed)
Called patient. States that for a few days after starting medication she was having mild intermittent chest pain. She denies current chest pain now.  ? ?Patient is also asking about treatment for possible yeast infection. Patient reports using a different shaving cream and that she is now having burning, itching and irritation in perineal area. She denies vaginal discharge or odor.  ? ?Will forward to PCP for further advisement.  ? ?Talbot Grumbling, RN ? ?

## 2021-11-11 NOTE — Telephone Encounter (Signed)
Patient is calling back and would like to speak with nurse or Dr. [name] concerning the dosage of the medication and swelling.  ? ?I informed her it was documented that she spoke with Jarrett Soho yesterday and that she was waiting to hear back from Dr. [name] before returning her call.  ? ?Patient asked for someone to call her as soon as possible to discuss.  ?

## 2021-11-12 NOTE — Telephone Encounter (Signed)
Patient is returning Dr. Lennie Odor call. She is not working today and was asleep when she called this morning. She said Dr. [name] can call her back at any time today.   ?

## 2021-11-12 NOTE — Telephone Encounter (Signed)
Attempted to call patient, LVM, asked her to call back, sent message in other mychart encounter ?Barbara Rio, MD  ?

## 2021-11-12 NOTE — Telephone Encounter (Signed)
Attempted to call patient, LVM, asked her to call back, sent message in other mychart encounter ?[name], MD  ?

## 2021-11-14 NOTE — Telephone Encounter (Signed)
Late entry, Spoke w patient on phone 3/23, she reports had swelling in bilateral legs initially, R>L. No redness or warmth when they were swollen. Have not been swollen since then. Took BP at work and got 139/78. ? ?Endorses pains in body, can't tell if joints vs muscles. Recently started pravastatin. ? ?Regarding chest pain, had mild pains in chest about a week ago, wasn't concerned about them. Resolved after 1 day. Isn't sure if got worse with walking around. Denies chest pain or shortness of breath now. ? ?Scheduled patient to see me in clinic this coming Tuesday to discuss further and do EKG. Discussed return precautions, reasons to go to ED before then (pain that recurs, associated w/ shortness of breath, doesn't go away with rest, etc). ? ?Likely check CK at that visit as well. She will continue current medications until then. ? ?Patient appreciative of call ?[name], MD  ?

## 2021-11-14 NOTE — Telephone Encounter (Signed)
Late entry, Spoke w patient on phone 3/23, she got over the counter yeast infection cream which has helped and symptoms are resolved ?[name], MD  ?

## 2021-11-17 NOTE — Progress Notes (Signed)
?  Date of Visit: 11/18/2021  ? ?SUBJECTIVE:  ? ?HPI: ? ?Barbara Reese presents today for EKG after having chest pain at home several weeks ago. ? ?Hypertension/CP - Chest pain has resolved. Doing well now. She started amlodipine '10mg'$  daily which has improved her blood pressure (stopped HCTZ due to gout). She was not ever too concerned about this chest pain but had it transiently after starting the amlodipine. ? ?Hyperlipidemia - taking pravastatin '40mg'$  daily. Has muscle aches, pains in body (known osteoarthritis). Thinks overall these symptoms are tolerable and is willign to continue pravastatin. Would like liver function tested today. ? ?Fatigue - feeling more tired lately. Previously was suppsoed to be set up for sleep study but never got scheduled for it.  ? ?Diabetes - seeing pharmacy team here today to discuss starting GLP-1 for diabetes and weight loss benefit. ? ? ?OBJECTIVE:  ? ?BP 119/80   Pulse 87   Wt 258 lb 12.8 oz (117.4 kg)   LMP 02/09/2016 (Exact Date)   SpO2 97%   BMI 41.77 kg/m?  ?Gen: no acute distress, pleasant, cooperative ?HEENT: normocephalic, atraumatic  ?Heart: regular rate and rhythm, no murmur ?Lungs: clear to auscultation bilaterally, normal work of breathing  ? ?ASSESSMENT/PLAN:  ? ?Health maintenance:  ?-encouraged COVID booster, patient not yet ready for this today ?-eye exam scheduled for 4/10 ?-reminded of shingrix vaccine rx (has this at home) ? ?CP - resolved. EKG unremarkable today and unchanged from prior. No further workup planned at this time. ? ?Type 2 diabetes mellitus ([place]) ?Seeing pharmacy team today to discuss GLP-1 ?Follow up with me in 3 months for next A1c ? ?OBSTRUCTIVE SLEEP APNEA ?Gave phone # to [facility] sleep center to schedule split night study ?Suspect fatigue may be from untreated OSA ?Check CBC, TSH today to eval for other causes ? ?Hypertension ?Well controlled on amlodipine ?Continue this medication  ? ?Hyperlipidemia ?Pravastatin is tolerable for patient ?Check CK  today to eval for statin myopathy ?Also update LFTs to ensure stable as patient concerned about this ? ?Arthritis of knee, degenerative ?Discussed with patient that I would recommend avoiding recurrent rx's for oxycodone to treat pain, given habit forming nature and potential for transition into chronic use. ?Ok to continue as needed tramadol.  ?She is using both sparingly, hasn't taken oxycodone in a week. ? ?FOLLOW UP: ?Follow up in 3 months for next A1c ?Seeing pharmacy clinic today for GLP-1 discussion ? ?[place]. [name], MD ?[facility] ?

## 2021-11-18 ENCOUNTER — Other Ambulatory Visit (HOSPITAL_COMMUNITY): Payer: Self-pay

## 2021-11-18 ENCOUNTER — Other Ambulatory Visit: Payer: Self-pay

## 2021-11-18 ENCOUNTER — Ambulatory Visit (INDEPENDENT_AMBULATORY_CARE_PROVIDER_SITE_OTHER): Payer: 59 | Admitting: Pharmacist

## 2021-11-18 ENCOUNTER — Ambulatory Visit (INDEPENDENT_AMBULATORY_CARE_PROVIDER_SITE_OTHER): Payer: 59 | Admitting: Family Medicine

## 2021-11-18 VITALS — BP 119/80 | HR 87 | Ht 66.0 in | Wt 258.8 lb

## 2021-11-18 DIAGNOSIS — Z5181 Encounter for therapeutic drug level monitoring: Secondary | ICD-10-CM | POA: Diagnosis not present

## 2021-11-18 DIAGNOSIS — E119 Type 2 diabetes mellitus without complications: Secondary | ICD-10-CM

## 2021-11-18 DIAGNOSIS — R7303 Prediabetes: Secondary | ICD-10-CM | POA: Diagnosis not present

## 2021-11-18 DIAGNOSIS — E785 Hyperlipidemia, unspecified: Secondary | ICD-10-CM | POA: Diagnosis not present

## 2021-11-18 DIAGNOSIS — R079 Chest pain, unspecified: Secondary | ICD-10-CM

## 2021-11-18 DIAGNOSIS — I10 Essential (primary) hypertension: Secondary | ICD-10-CM

## 2021-11-18 DIAGNOSIS — R5383 Other fatigue: Secondary | ICD-10-CM | POA: Diagnosis not present

## 2021-11-18 DIAGNOSIS — G4733 Obstructive sleep apnea (adult) (pediatric): Secondary | ICD-10-CM

## 2021-11-18 DIAGNOSIS — M170 Bilateral primary osteoarthritis of knee: Secondary | ICD-10-CM

## 2021-11-18 MED ORDER — OZEMPIC (0.25 OR 0.5 MG/DOSE) 2 MG/1.5ML ~~LOC~~ SOPN
0.2500 mg | PEN_INJECTOR | SUBCUTANEOUS | 1 refills | Status: DC
  Filled 2021-11-18 – 2021-11-21 (×4): qty 1.5, 56d supply, fill #0
  Filled 2021-12-29: qty 1.5, 56d supply, fill #1

## 2021-11-18 NOTE — Progress Notes (Signed)
? ? ?S:    ? ?Chief Complaint  ?Patient presents with  ? Medication Management  ?  DM f/u  ? ? ?Barbara Reese is a 56 y.o. female who presents for diabetes evaluation, education, and management. PMH is significant for diabetes, hypertension. Patient was referred and last seen by Primary Care Provider, Dr. [name], on 11/04/21.  She was seen by Dr. [name] this morning.  ? ?Today, She arrives in good spirits and presents without assistance.  ? ?Patient reports reluctance to take medications and side effects associated. Patient reports multiple family members have used Ozempic (semaglutide) and have been able to tolerate it well while seeing results with weight loss and lower A1c numbers. Reports she does not like the feeling of nausea or headaches and does not want to feel bad. Patient reports she has Colace pills at home and that this works for her with constipation. Patient denies ever using any injections. ? ?Current diabetes medications include: none ?Current hypertension medications include: amlodipine 10 mg daily ?Current hyperlipidemia medications include: pravastatin 40 mg daily ? ?Patient states that She is taking her medications as prescribed. Patient reports adherence with medications. Patient reports that she has not taken allopurinol in ~2 weeks as she was told there was an interaction between medications she is taking. ? ?Do you feel that your medications are working for you? yes ?Have you been experiencing any side effects to the medications prescribed? Yes. Swelling in ankles when first starting amlodipine. Patient reports seeing more constipation and indigestion. Amlodipine and pravastatin are the medications started most recently. Patient reports continuing to take amlodipine when swelling occurred and that swelling has gone down tremendously. ? ?Patient denies hypoglycemic events. ? ?Patient denies checking blood sugars at home. ? ?Patient reports nocturia (nighttime urination) ~2-3 times per  night. ? ?O:  ?Physical Exam ?Constitutional:   ?   Appearance: Normal appearance. She is normal weight.  ?Pulmonary:  ?   Effort: Pulmonary effort is normal.  ?Neurological:  ?   Mental Status: She is alert.  ?Psychiatric:     ?   Mood and Affect: Mood normal.     ?   Behavior: Behavior normal.     ?   Thought Content: Thought content normal.     ?   Judgment: Judgment normal.  ? ? ?Review of Systems  ?Cardiovascular:  Positive for leg swelling.  ?All other systems reviewed and are negative. ? ?Lab Results  ?Component Value Date  ? HGBA1C 7.6 (A) 11/04/2021  ? ?There were no vitals filed for this visit. ? ?Lipid Panel  ?   ?Component Value Date/Time  ? CHOL 226 (H) 11/04/2021 1045  ? TRIG 94 11/04/2021 1045  ? HDL 67 11/04/2021 1045  ? CHOLHDL 3.4 11/04/2021 1045  ? CHOLHDL 2.9 09/04/2016 0836  ? VLDL 18 09/04/2016 0836  ? LDLCALC 143 (H) 11/04/2021 1045  ? LDLDIRECT 130 (H) 10/02/2020 7371  ? LDLDIRECT 123 (H) 11/13/2011 1652  ? ? ?Clinical Atherosclerotic Cardiovascular Disease (ASCVD): No  ?The 10-year ASCVD risk score (Arnett DK, et al., 2019) is: 28.1% ?  Values used to calculate the score: ?    Age: 73 years ?    Sex: Female ?    Is Non-Hispanic African American: Yes ?    Diabetic: Yes ?    Tobacco smoker: Yes ?    Systolic Blood Pressure: 062 mmHg ?    Is BP treated: Yes ?    HDL Cholesterol: 67 mg/dL ?  Total Cholesterol: 226 mg/dL  ? ? ?A/P: ?Diabetes longstanding currently uncontrolled based on A1c 7.6. Control is suboptimal due to patient currently not taking any diabetes medications.  She is not currently checking blood sugars at home.  ?-Started GLP-1 Ozempic (generic name semaglutide) at 0.25 mg weekly. Patient counseled on injection technique ?-Patient educated on purpose, proper use, and potential adverse effects of GI upset.  ?-Extensively discussed pathophysiology of diabetes, recommended lifestyle interventions, dietary effects on blood sugar control.  ?-Counseled on s/sx of and management of  hypoglycemia.  ?-Next A1c anticipated 01/2022.  ? ?Patient reported goal weight in 1 month: losing 10 pounds ? ?At f/u visit will consider talking about home monitoring strategies. ? ?Written patient instructions provided. Patient verbalized understanding of treatment plan. Total time in face to face counseling 18 minutes.   ? ?Follow up pharmacist in 4 weeks on April 25. Patient seen with Earvin Hansen PharmD Candidate and Zenaida Deed, PharmD, PGY 1 pharmacy resident.  ?. ? ?

## 2021-11-18 NOTE — Patient Instructions (Signed)
Nice to see you today! ? ?Today we started Ozempic (semaglutide) at 0.25 mg weekly and counseled on injection technique. You can inject this at any time of day. ? ?We will follow up with you in 4 weeks on April 25.  ?

## 2021-11-18 NOTE — Assessment & Plan Note (Signed)
Pravastatin is tolerable for patient ?Check CK today to eval for statin myopathy ?Also update LFTs to ensure stable as patient concerned about this ?

## 2021-11-18 NOTE — Assessment & Plan Note (Addendum)
Gave phone # to [facility] sleep center to schedule split night study ?Suspect fatigue may be from untreated OSA ?Check CBC, TSH today to eval for other causes ?

## 2021-11-18 NOTE — Assessment & Plan Note (Signed)
Diabetes longstanding currently uncontrolled based on A1c 7.6. Control is suboptimal due to patient currently not taking any diabetes medications.  She is not currently checking blood sugars at home.  ?-Started GLP-1 Ozempic (generic name semaglutide) at 0.25 mg weekly. Patient counseled on injection technique ?-Patient educated on purpose, proper use, and potential adverse effects of GI upset.  ?-Extensively discussed pathophysiology of diabetes, recommended lifestyle interventions, dietary effects on blood sugar control.  ?-Counseled on s/sx of and management of hypoglycemia.  ?-Next A1c anticipated 01/2022.  ?

## 2021-11-18 NOTE — Patient Instructions (Addendum)
It was great to see you again today! ? ?EKG looks great. ?Stay on current medications ?Checking labwork today ? ?Call [facility] [phone] to schedule sleep study ? ?Follow up with me in 3 months  ? ?Be well, ?Dr. [name]  ?

## 2021-11-18 NOTE — Progress Notes (Signed)
Reviewed: I agree with Dr. Koval's documentation and management. 

## 2021-11-18 NOTE — Assessment & Plan Note (Deleted)
Diabetes longstanding currently uncontrolled based on A1c 7.6. Control is suboptimal due to patient currently not taking any diabetes medications.  She is not currently checking blood sugars at home.  ?-Started GLP-1 Ozempic (generic name semaglutide) at 0.25 mg weekly. Patient counseled on injection technique ?-Patient educated on purpose, proper use, and potential adverse effects of GI upset.  ?-Extensively discussed pathophysiology of diabetes, recommended lifestyle interventions, dietary effects on blood sugar control.  ?-Counseled on s/sx of and management of hypoglycemia.  ?-Next A1c anticipated 01/2022.  ?

## 2021-11-18 NOTE — Assessment & Plan Note (Signed)
Well controlled on amlodipine ?Continue this medication  ?

## 2021-11-18 NOTE — Assessment & Plan Note (Signed)
Discussed with patient that I would recommend avoiding recurrent rx's for oxycodone to treat pain, given habit forming nature and potential for transition into chronic use. ?Ok to continue as needed tramadol.  ?She is using both sparingly, hasn't taken oxycodone in a week. ?

## 2021-11-18 NOTE — Progress Notes (Deleted)
? ?  I, Wendy Poet, LAT, ATC, am serving as scribe for Dr. Lynne Leader. ? ?Barbara Reese is a 56 y.o. female who presents to [place] at [facility] today for f/u of L great toe pain due to gout.  She was last seen by Dr. Georgina Snell on 11/04/21 and was not taking allopurinol consistently and was not taking colchicine as she ran out of that medication.  She was prescribed oxycodone-acetaminophen for pain, colchicine and allopurinol.  She was also advised to purchase/wear a CAM walker.  Today, pt reports  ? ?Dx testing: L foot XR- 06/23/21 ?11/04/21 Labs ?06/23/21 Labs  ?06/23/21 L foot XR ? ?Pertinent review of systems: *** ? ?Relevant historical information: *** ? ? ?Exam:  ?LMP 02/09/2016 (Exact Date)  ?General: Well Developed, well nourished, and in no acute distress.  ? ?MSK: *** ? ? ? ?Lab and Radiology Results ?No results found for this or any previous visit (from the past 72 hour(s)). ?No results found. ? ? ? ? ?Assessment and Plan: ?56 y.o. female with *** ? ? ?PDMP not reviewed this encounter. ?No orders of the defined types were placed in this encounter. ? ?No orders of the defined types were placed in this encounter. ? ? ? ?Discussed warning signs or symptoms. Please see discharge instructions. Patient expresses understanding. ? ? ?*** ? ?

## 2021-11-18 NOTE — Assessment & Plan Note (Signed)
Seeing pharmacy team today to discuss GLP-1 ?Follow up with me in 3 months for next A1c ?

## 2021-11-19 ENCOUNTER — Telehealth: Payer: Self-pay

## 2021-11-19 ENCOUNTER — Ambulatory Visit: Payer: 59 | Admitting: Family Medicine

## 2021-11-19 ENCOUNTER — Other Ambulatory Visit (HOSPITAL_COMMUNITY): Payer: Self-pay

## 2021-11-19 LAB — CMP14+EGFR
ALT: 11 IU/L (ref 0–32)
AST: 14 IU/L (ref 0–40)
Albumin/Globulin Ratio: 1.6 (ref 1.2–2.2)
Albumin: 4.4 g/dL (ref 3.8–4.9)
Alkaline Phosphatase: 58 IU/L (ref 44–121)
BUN/Creatinine Ratio: 11 (ref 9–23)
BUN: 10 mg/dL (ref 6–24)
Bilirubin Total: 0.5 mg/dL (ref 0.0–1.2)
CO2: 25 mmol/L (ref 20–29)
Calcium: 9.7 mg/dL (ref 8.7–10.2)
Chloride: 102 mmol/L (ref 96–106)
Creatinine, Ser: 0.88 mg/dL (ref 0.57–1.00)
Globulin, Total: 2.8 g/dL (ref 1.5–4.5)
Glucose: 185 mg/dL — ABNORMAL HIGH (ref 70–99)
Potassium: 3.8 mmol/L (ref 3.5–5.2)
Sodium: 140 mmol/L (ref 134–144)
Total Protein: 7.2 g/dL (ref 6.0–8.5)
eGFR: 77 mL/min/{1.73_m2} (ref >59)

## 2021-11-19 LAB — CBC
Hematocrit: 37.7 % (ref 34.0–46.6)
Hemoglobin: 12.7 g/dL (ref 11.1–15.9)
MCH: 28 pg (ref 26.6–33.0)
MCHC: 33.7 g/dL (ref 31.5–35.7)
MCV: 83 fL (ref 79–97)
Platelets: 307 10*3/uL (ref 150–450)
RBC: 4.53 x10E6/uL (ref 3.77–5.28)
RDW: 13.4 % (ref 11.7–15.4)
WBC: 5.2 10*3/uL (ref 3.4–10.8)

## 2021-11-19 LAB — CK: Total CK: 118 U/L (ref 32–182)

## 2021-11-19 LAB — TSH RFX ON ABNORMAL TO FREE T4: TSH: 1.64 u[IU]/mL (ref 0.450–4.500)

## 2021-11-19 NOTE — Telephone Encounter (Signed)
Error

## 2021-11-19 NOTE — Telephone Encounter (Signed)
A Prior Authorization was initiated for this patients OZEMPIC through CoverMyMeds.  ? ?Key: [id number] ? ?

## 2021-11-20 ENCOUNTER — Other Ambulatory Visit (HOSPITAL_COMMUNITY): Payer: Self-pay

## 2021-11-21 ENCOUNTER — Other Ambulatory Visit (HOSPITAL_COMMUNITY): Payer: Self-pay

## 2021-11-22 ENCOUNTER — Encounter: Payer: Self-pay | Admitting: Family Medicine

## 2021-11-25 ENCOUNTER — Other Ambulatory Visit (HOSPITAL_COMMUNITY): Payer: Self-pay

## 2021-11-25 ENCOUNTER — Ambulatory Visit: Payer: 59 | Admitting: Family Medicine

## 2021-11-25 NOTE — Telephone Encounter (Signed)
Prior Auth for patients medication OZEMPIC approved by [facility] from 11/22/22 to 11/21/22. ? ?Patients pharmacy notified. ? ?

## 2021-12-16 ENCOUNTER — Other Ambulatory Visit (HOSPITAL_COMMUNITY): Payer: Self-pay

## 2021-12-16 ENCOUNTER — Other Ambulatory Visit: Payer: Self-pay | Admitting: Family Medicine

## 2021-12-16 ENCOUNTER — Encounter: Payer: Self-pay | Admitting: Family Medicine

## 2021-12-16 ENCOUNTER — Ambulatory Visit (INDEPENDENT_AMBULATORY_CARE_PROVIDER_SITE_OTHER): Payer: 59 | Admitting: Pharmacist

## 2021-12-16 ENCOUNTER — Encounter: Payer: Self-pay | Admitting: Pharmacist

## 2021-12-16 VITALS — BP 137/86 | HR 83 | Ht 67.0 in | Wt 252.2 lb

## 2021-12-16 DIAGNOSIS — M109 Gout, unspecified: Secondary | ICD-10-CM

## 2021-12-16 DIAGNOSIS — E119 Type 2 diabetes mellitus without complications: Secondary | ICD-10-CM | POA: Diagnosis not present

## 2021-12-16 DIAGNOSIS — E785 Hyperlipidemia, unspecified: Secondary | ICD-10-CM

## 2021-12-16 DIAGNOSIS — I10 Essential (primary) hypertension: Secondary | ICD-10-CM | POA: Diagnosis not present

## 2021-12-16 MED ORDER — LOSARTAN POTASSIUM 25 MG PO TABS
25.0000 mg | ORAL_TABLET | Freq: Every day | ORAL | 1 refills | Status: DC
Start: 2021-12-16 — End: 2022-02-07
  Filled 2021-12-16: qty 30, 30d supply, fill #0
  Filled 2022-01-13: qty 30, 30d supply, fill #1

## 2021-12-16 MED ORDER — AMLODIPINE BESYLATE 5 MG PO TABS
5.0000 mg | ORAL_TABLET | Freq: Every day | ORAL | 1 refills | Status: DC
  Filled 2021-12-16: qty 30, 30d supply, fill #0
  Filled 2022-01-13: qty 30, 30d supply, fill #1

## 2021-12-16 MED ORDER — OXYCODONE-ACETAMINOPHEN 10-325 MG PO TABS
1.0000 | ORAL_TABLET | Freq: Three times a day (TID) | ORAL | 0 refills | Status: DC | PRN
  Filled 2021-12-16: qty 15, 5d supply, fill #0

## 2021-12-16 MED ORDER — ONDANSETRON HCL 4 MG PO TABS
4.0000 mg | ORAL_TABLET | Freq: Three times a day (TID) | ORAL | 0 refills | Status: DC | PRN
  Filled 2021-12-16: qty 10, 4d supply, fill #0

## 2021-12-16 MED ORDER — EZETIMIBE 10 MG PO TABS
10.0000 mg | ORAL_TABLET | Freq: Every day | ORAL | 3 refills | Status: DC
  Filled 2021-12-16: qty 90, 90d supply, fill #0

## 2021-12-16 NOTE — Telephone Encounter (Signed)
I called Barbara Reese.  Clarified which medicine she was referring to.  She has plenty allopurinol and which is seen.  It sounds like she is having a gout flare.  Refilled oxycodone.  She is having metabolic panel checked next week.  We will add on uric acid. ?

## 2021-12-16 NOTE — Patient Instructions (Addendum)
Nice to see you today! ? ?For the coming week, turn your Ozempic pen to 0.25 mg and then turn back 2 clicks. The following week, turn your Ozempic pen to 0.25 mg and then turn back only 1 click. After that, stay at the 0.25 mg dose. This gradual increase should help with nausea. We sent in an order for Zofran to assist with this. ? ?We are also decreasing your amlodipine dose from 10 mg to 5 mg daily. We are adding losartan 25 mg daily to help with your blood pressure and gout. This change should also help with your leg swelling. ? ?For your cholesterol, we are starting Zetia (ezetimibe) 10 mg daily. Continue taking your pravastatin 40 mg daily. ? ?We will have you come back for labs next week (Wednesday) and follow up with you in 4 weeks.  ?

## 2021-12-16 NOTE — Progress Notes (Signed)
? ? ?S:    ? ?Chief Complaint  ?Patient presents with  ? Medication Management  ?  DM f/u  ? ?Barbara Reese is a 56 y.o. female who presents for diabetes evaluation, education, and management. PMH is significant for T2DM, HTN, gout. Patient was referred and last seen by Primary Care Provider, Dr. [name], on 11/18/21. Patient was last seen by pharmacy team on 11/18/21. At last visit, started Ozempic (semaglutide) 0.25 mg weekly.  ? ?Today, patient arrives in good spirits and presents without assistance.  ? ?Current diabetes medications include: Ozempic (semaglutide) 0.25 mg weekly ?Current hypertension medications include: Amlodipine 10 mg daily ?Current hyperlipidemia medications include: Pravastatin 40 mg. Patient does have previous documented statin intolerances. ? ?Patient reports leg swelling and that it worsens at the end of the day. Patient denies recently having any gout flares. ? ?Patient reports taking all medications as prescribed. Patient reports adherence with medications. Patient reports missing her medications 0 times per week, on average. ? ?Do you feel that your medications are working for you? yes ?Have you been experiencing any side effects to the medications prescribed? Yes. Patient reports significant  nausea with the Ozempic but that it has been decreasing with each dose she has taken. She reports that Ozempic has suppressed her appetite.  ? ?Patient reports hypoglycemic - like events with dizziness but feels better when she eats something.  ?Patient reports that she does not check blood sugars at home. ? ?Patient reports nocturia (nighttime urination) ~2x per night which has improved from her previous 3-4 times per night. ?Patient denies neuropathy (nerve pain) but did notice tingling in her right toe on Sunday.  ?Patient denies visual changes. ?Patient reports self foot exams.  ? ?Patient reported dietary habits: Eats 1 meals/day ?Breakfast: doesn't eat breakfast ?Lunch: doesn't typically  eat a full meal as she snacks throughout the day ?Dinner: salad, [employer] last night (reports normally eating 3 strips but could only eat 1.5 last night) ?Snacks: yogurt, salad  ?Drinks: water, diet green tea ? ?Patient-reported exercise habits: walking dog. 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 ? ?O:  ?Physical Exam ?Constitutional:   ?   Appearance: Normal appearance. She is normal weight.  ?Pulmonary:  ?   Effort: Pulmonary effort is normal.  ?Musculoskeletal:     ?   General: Swelling present.  ?   Right lower leg: Edema (1+) present.  ?   Left lower leg: Edema (1+) present.  ?Neurological:  ?   Mental Status: She is alert.  ?Psychiatric:     ?   Mood and Affect: Mood normal.     ?   Behavior: Behavior normal.     ?   Thought Content: Thought content normal.     ?   Judgment: Judgment normal.  ? ? ?Review of Systems  ?Cardiovascular:  Positive for leg swelling.  ?All other systems reviewed and are negative. ? ?Lab Results  ?Component Value Date  ? HGBA1C 7.6 (A) 11/04/2021  ? ?Vitals:  ? 12/16/21 1000  ?BP: 137/86  ?Pulse: 83  ?SpO2: 99%  ? ? ?Lipid Panel  ?   ?Component Value Date/Time  ? CHOL 226 (H) 11/04/2021 1045  ? TRIG 94 11/04/2021 1045  ? HDL 67 11/04/2021 1045  ? CHOLHDL 3.4 11/04/2021 1045  ? CHOLHDL 2.9 09/04/2016 0836  ? VLDL 18 09/04/2016 0836  ? LDLCALC 143 (H) 11/04/2021 1045  ? LDLDIRECT 130 (H) 10/02/2020 6546  ?  LDLDIRECT 123 (H) 11/13/2011 1652  ? ? ?Clinical Atherosclerotic Cardiovascular Disease (ASCVD): No  ?The 10-year ASCVD risk score (Arnett DK, et al., 2019) is: 26.4% ?  Values used to calculate the score: ?    Age: 61 years ?    Sex: Female ?    Is Non-Hispanic African American: Yes ?    Diabetic: Yes ?    Tobacco smoker: Yes ?    Systolic Blood Pressure: 453 mmHg ?    Is BP treated: Yes ?    HDL Cholesterol: 67 mg/dL ?    Total Cholesterol: 226 mg/dL  ? ?A/P: ?Diabetes longstanding currently with potential improved control based on  decreased nocturia and decreased thirst. Patient does not check blood sugars at home. Patient is able to verbalize appropriate hypoglycemia management plan. Medication adherence appears optimal. Control is suboptimal due to dietary indiscretion and need for further weight loss. ?-Continued GLP-1 Ozempic (semaglutide) at 0.25 mg weekly with small dose adjustment. For nausea adverse effect, gave patient instructions to turn Ozempic pen to 0.25 mg and then turn back 2 clicks for this coming week. The following week, turn Ozempic pen to 0.25 mg and then turn back only 1 click. After that, stay at the 0.25 mg dose. This gradual increase should help with nausea.  ?- Sent in order for Zofran (ondansetron) PRN for moderate nausea adverse effect. ?-Patient educated on purpose, proper use, and potential adverse effects of Ozempic (semaglutide).  ?-Extensively discussed pathophysiology of diabetes, recommended lifestyle interventions, dietary effects on blood sugar control.  ?-Counseled on s/sx of and management of hypoglycemia.  ?-Next A1c anticipated in June.  ? ?Hyperlipidemia / ASCVD risk - primary prevention in patient with diabetes. Last LDL is 143 not at goal of <70 mg/dL. ASCVD risk factors include diabetes, smoker, elevated LDL and 10-year ASCVD risk score of 17.3%. Documented statin intolerances to atorvastatin and rosuvastatin. ?-Continued pravastatin 40 mg daily. ?- Started Zetia (ezetimibe) 10 mg daily.  ? ?Hypertension longstanding currently uncontrolled with leg swelling currently present. Blood pressure goal of <130/80 mmHg. Medication adherence optimal. Blood pressure control is suboptimal due to need for medication optimization. ?-Decreased dose of amlodipine from 10 mg to 5 mg daily. ?-Started losartan 25 mg daily to help lower blood pressure and uric acid level. ?- Repeat BMET in 1 week ? ?Written patient instructions provided. Patient verbalized understanding of treatment plan. Total time in face to face  counseling 51 minutes.   ? ?Follow up lab visit in 2 weeks. Pharmacist in 4 weeks. PCP clinic visit in June. Patient seen with Earvin Hansen PharmD Candidate. ? ?

## 2021-12-16 NOTE — Assessment & Plan Note (Signed)
Hypertension longstanding currently uncontrolled with leg swelling currently present. Blood pressure goal of <130/80 mmHg. Medication adherence optimal. Blood pressure control is suboptimal due to need for medication optimization. ?-Decreased dose of amlodipine from 10 mg to 5 mg daily. ?-Started losartan 25 mg daily to help lower blood pressure and uric acid level. ?- Repeat BMET in 1 week ?

## 2021-12-16 NOTE — Progress Notes (Signed)
Reviewed: I agree with Dr. Koval's documentation and management. 

## 2021-12-16 NOTE — Telephone Encounter (Signed)
Patient called to follow up on this refill request

## 2021-12-16 NOTE — Assessment & Plan Note (Signed)
Diabetes longstanding currently with potential improved control based on decreased nocturia and decreased thirst. Patient does not check blood sugars at home. Patient is able to verbalize appropriate hypoglycemia management plan. Medication adherence appears optimal. Control is suboptimal due to dietary indiscretion and need for further weight loss. ?-Continued GLP-1 Ozempic (semaglutide) at 0.25 mg weekly with small dose adjustment. For nausea adverse effect, gave patient instructions to turn Ozempic pen to 0.25 mg and then turn back 2 clicks for this coming week. The following week, turn Ozempic pen to 0.25 mg and then turn back only 1 click. After that, stay at the 0.25 mg dose. This gradual increase should help with nausea.  ?- Sent in order for Zofran (ondansetron) PRN for moderate nausea adverse effect. ?-Patient educated on purpose, proper use, and potential adverse effects of Ozempic (semaglutide).  ?-Extensively discussed pathophysiology of diabetes, recommended lifestyle interventions, dietary effects on blood sugar control.  ?-Counseled on s/sx of and management of hypoglycemia.  ?-Next A1c anticipated in June.  ? ?

## 2021-12-16 NOTE — Assessment & Plan Note (Signed)
Hyperlipidemia / ASCVD risk - primary prevention in patient with diabetes. Last LDL is 143 not at goal of <70 mg/dL. ASCVD risk factors include diabetes, smoker, elevated LDL and 10-year ASCVD risk score of 17.3%. Documented statin intolerances to atorvastatin and rosuvastatin. ?-Continued pravastatin 40 mg daily. ?- Started Zetia (ezetimibe) 10 mg daily.  ? ?

## 2021-12-24 ENCOUNTER — Other Ambulatory Visit: Payer: 59

## 2021-12-24 DIAGNOSIS — I10 Essential (primary) hypertension: Secondary | ICD-10-CM

## 2021-12-24 DIAGNOSIS — M109 Gout, unspecified: Secondary | ICD-10-CM | POA: Diagnosis not present

## 2021-12-25 ENCOUNTER — Telehealth: Payer: Self-pay | Admitting: Pharmacist

## 2021-12-25 LAB — BASIC METABOLIC PANEL
BUN/Creatinine Ratio: 13 (ref 9–23)
BUN: 11 mg/dL (ref 6–24)
CO2: 25 mmol/L (ref 20–29)
Calcium: 9.6 mg/dL (ref 8.7–10.2)
Chloride: 104 mmol/L (ref 96–106)
Creatinine, Ser: 0.85 mg/dL (ref 0.57–1.00)
Glucose: 110 mg/dL — ABNORMAL HIGH (ref 70–99)
Potassium: 3.7 mmol/L (ref 3.5–5.2)
Sodium: 143 mmol/L (ref 134–144)
eGFR: 80 mL/min/{1.73_m2} (ref >59)

## 2021-12-25 LAB — URIC ACID: Uric Acid: 5.7 mg/dL (ref 3.0–7.2)

## 2021-12-25 NOTE — Telephone Encounter (Signed)
Noted and agree. 

## 2021-12-25 NOTE — Telephone Encounter (Signed)
-----   Message from Zenia Resides, MD sent at 12/25/2021  8:35 AM EDT ----- ? ?----- Message ----- ?From: Interface, [facility] Lab Results In ?Sent: 12/25/2021   8:17 AM EDT ?To: Zenia Resides, MD ? ? ?

## 2021-12-25 NOTE — Telephone Encounter (Signed)
Attempted to contact patient for follow-up of lab results.  ? ?Left HIPAA compliant voice mail regarding results that were not concerning.  Shared that I would explain more details about her result at our upcoming visit in a few weeks.  ? ?Total time with patient call and documentation of interaction: 7 minutes. ? ? ?Notes: Patient was not taking losartan at last visit, stating she only used it PRN.  ?Losartan may have contributed to lowering uric acid to 5.7 ?Plan to continue following gout flare frequency with losartan use.  ?

## 2021-12-25 NOTE — Progress Notes (Signed)
Uric acid level looks great.  Please continue current dose of allopurinol.  Please let me know if you need a refill.

## 2021-12-29 ENCOUNTER — Other Ambulatory Visit (HOSPITAL_COMMUNITY): Payer: Self-pay

## 2022-01-05 ENCOUNTER — Encounter: Payer: Self-pay | Admitting: Family Medicine

## 2022-01-05 ENCOUNTER — Other Ambulatory Visit (HOSPITAL_COMMUNITY): Payer: Self-pay

## 2022-01-05 MED ORDER — OZEMPIC (0.25 OR 0.5 MG/DOSE) 2 MG/3ML ~~LOC~~ SOPN
0.2500 mg | PEN_INJECTOR | SUBCUTANEOUS | 0 refills | Status: DC
  Filled 2022-01-05: qty 3, 56d supply, fill #0

## 2022-01-06 ENCOUNTER — Ambulatory Visit (INDEPENDENT_AMBULATORY_CARE_PROVIDER_SITE_OTHER): Payer: 59 | Admitting: Family Medicine

## 2022-01-06 ENCOUNTER — Encounter: Payer: Self-pay | Admitting: Family Medicine

## 2022-01-06 ENCOUNTER — Other Ambulatory Visit (HOSPITAL_COMMUNITY): Payer: Self-pay

## 2022-01-06 VITALS — BP 135/85 | HR 78 | Ht 66.0 in | Wt 249.0 lb

## 2022-01-06 DIAGNOSIS — R21 Rash and other nonspecific skin eruption: Secondary | ICD-10-CM

## 2022-01-06 DIAGNOSIS — L430 Hypertrophic lichen planus: Secondary | ICD-10-CM

## 2022-01-06 MED ORDER — CLOBETASOL PROPIONATE 0.05 % EX CREA
TOPICAL_CREAM | Freq: Every day | CUTANEOUS | 0 refills | Status: DC | PRN
  Filled 2022-01-06: qty 45, 30d supply, fill #0

## 2022-01-06 NOTE — Patient Instructions (Signed)
Use clobetasol 1-2x daily for the next 2 weeks. Take 1 tablet of Benadryl (25 mg) with Zrytec twice a day for itching.   ? ?Follow up in 2 weeks,  if not improved.  We can consider oral steroids at that time.  ? ?Be sure to keep your dermatology appointments.   ? ? ?

## 2022-01-06 NOTE — Progress Notes (Signed)
   SUBJECTIVE:   CHIEF COMPLAINT / HPI:   Chief Complaint  Patient presents with   Rash    X2wks all over body     Barbara Reese is a 56 y.o. female here for rash that has been present for the past 2 weeks.  Patient reports history of lichen planus diagnosed by Dr. Patrick Jupiter via biopsies few years ago.  Notes new rash that showed up with itchy fluid-filled vesicles.  She has not been using her clobetasol daily but this helps the itching.  She started losartan and Zetia around the same time that rash showed up.  She switched her soap to Cetaphil and her lotion to Aveeno she has been using all clear detergent free laundry detergent.  Denies oral lesions, fever, nausea vomiting or abdominal pain.    PERTINENT  PMH / PSH: reviewed and updated as appropriate   OBJECTIVE:   BP 135/85   Pulse 78   Ht '5\' 6"'$  (1.676 m)   Wt 249 lb (112.9 kg)   LMP 02/09/2016 (Exact Date)   SpO2 100%   BMI 40.19 kg/m    GEN: pleasant well appearing female, in no acute distress  CV: regular rate and rhythm, no murmurs appreciated  RESP: no increased work of breathing, clear to ascultation bilaterally SKIN: warm, dry, purple plaques on lower extremities with vascular appearing papules, hyperpigmented papules on abdomen, back and upper extremities.  See images below        ASSESSMENT/PLAN:   Lichen planus hypertrophicus Uncontrolled.  Patient with new lesions on her extremities and trunk.  She has been intermittently using clobetasol.  Advised to use clobetasol daily (not extending 14 days).  Refill provided.  After shared decision making discussion, she would not like to do oral steroids at this time due to risk of increased blood sugars.  Has an appointment with her dermatologist on February 03, 2022.  Advised patient to keep this appointment.  Obtain RPR.  Likely not related to Zetia or losartan use as these medications were started around the same time the rash appeared.  No other new medications.  She  has no oral lesions.  Return precautions provided.     Lyndee Hensen, DO PGY-3, [facility] 01/06/2022

## 2022-01-07 ENCOUNTER — Ambulatory Visit (HOSPITAL_BASED_OUTPATIENT_CLINIC_OR_DEPARTMENT_OTHER): Payer: 59 | Attending: Family Medicine | Admitting: Internal Medicine

## 2022-01-07 VITALS — Ht 66.0 in | Wt 249.0 lb

## 2022-01-07 DIAGNOSIS — R0683 Snoring: Secondary | ICD-10-CM | POA: Insufficient documentation

## 2022-01-07 DIAGNOSIS — G4733 Obstructive sleep apnea (adult) (pediatric): Secondary | ICD-10-CM | POA: Insufficient documentation

## 2022-01-08 NOTE — Assessment & Plan Note (Addendum)
Uncontrolled.  Patient with new lesions on her extremities and trunk.  She has been intermittently using clobetasol.  Advised to use clobetasol daily (not extending 14 days).  Refill provided.  After shared decision making discussion, she would not like to do oral steroids at this time due to risk of increased blood sugars.  Has an appointment with her dermatologist on February 03, 2022.  Advised patient to keep this appointment.  Obtain RPR.  Likely not related to Zetia or losartan use as these medications were started around the same time the rash appeared.  No other new medications.  She has no oral lesions.  Return precautions provided.

## 2022-01-13 ENCOUNTER — Other Ambulatory Visit (HOSPITAL_COMMUNITY): Payer: Self-pay

## 2022-01-13 ENCOUNTER — Encounter: Payer: Self-pay | Admitting: Pharmacist

## 2022-01-13 ENCOUNTER — Ambulatory Visit (INDEPENDENT_AMBULATORY_CARE_PROVIDER_SITE_OTHER): Payer: 59 | Admitting: Pharmacist

## 2022-01-13 DIAGNOSIS — I10 Essential (primary) hypertension: Secondary | ICD-10-CM

## 2022-01-13 DIAGNOSIS — E119 Type 2 diabetes mellitus without complications: Secondary | ICD-10-CM | POA: Diagnosis not present

## 2022-01-13 DIAGNOSIS — E785 Hyperlipidemia, unspecified: Secondary | ICD-10-CM | POA: Diagnosis not present

## 2022-01-13 MED ORDER — OZEMPIC (0.25 OR 0.5 MG/DOSE) 2 MG/3ML ~~LOC~~ SOPN
0.5000 mg | PEN_INJECTOR | SUBCUTANEOUS | 3 refills | Status: DC
  Filled 2022-01-13 – 2022-02-18 (×3): qty 3, 28d supply, fill #0
  Filled 2022-03-31: qty 3, 28d supply, fill #1
  Filled 2022-04-28: qty 3, 28d supply, fill #2
  Filled 2022-06-23 – 2022-08-19 (×3): qty 3, 28d supply, fill #3

## 2022-01-13 NOTE — Progress Notes (Signed)
Reviewed: I agree with Dr. Koval's documentation and management. 

## 2022-01-13 NOTE — Assessment & Plan Note (Signed)
Hyperlipidemia with increased ASCVD risk - primary prevention in patient with diabetes. Last LDL is not at goal of <70  mg/dL. Myalgia related intolerance to multiple statins and now ezetimibe are challenging.  -Continued Pravastatin 40 mg daily.  -Consider use of Bempedoic Acid (Nexlitol) for additional LDL lowering vs. Lipid clinic referral for possible use of PCSK-9 therapy.

## 2022-01-13 NOTE — Progress Notes (Signed)
S:     Chief Complaint  Patient presents with   Medication Management    Medication Review and Follow-up   Barbara Reese is a 56 y.o. female who presents for diabetes evaluation, education, and management. PMH is significant for Hypertension, Diabetes and hypercholesterolemia. Patient was referred and last seen by Primary Care Provider, Dr. [name], on 11/18/2021.  At last visit, we initiated losartan and ezetimibe.  We also titrated Ozempic slightly to improve tolerability.   Today, patient arrives in good spirits and presents without assistance.   Current diabetes medications include: Ozempic (semaglutide) tolerated 0.'25mg'$  following dose reduction and slower titration.  Current hypertension medications include: amlodpine '5mg'$  and losartan '25mg'$  once daily Current hyperlipidemia medications include: pravastatin '40mg'$  - unable to tolerated most recent trial of ezetimibe due to myalgias that were significant (added ezetimibe to drug intolerance list).   Patient reports taking all medications as prescribed.  Do you feel that your medications are working for you? yes Have you been experiencing any side effects to the medications prescribed? no Do you have any problems obtaining medications due to transportation or finances? no  Patient denies hypoglycemic events.  Reported home fasting blood sugars: not checking (reports slight reduction in nocturia but continues to have 2-3 nocturia episodes per night).     O:  Physical Exam Constitutional:      Appearance: Normal appearance.  Pulmonary:     Effort: Pulmonary effort is normal.  Musculoskeletal:     Right lower leg: No edema.     Left lower leg: No edema.  Neurological:     Mental Status: She is alert.  Psychiatric:        Mood and Affect: Mood normal.        Behavior: Behavior normal.        Thought Content: Thought content normal.    Review of Systems  All other systems reviewed and are negative.   Lab Results   Component Value Date   HGBA1C 7.6 (A) 11/04/2021   Vitals:   01/13/22 1009  BP: 126/81  Pulse: 72  SpO2: 100%    Lipid Panel     Component Value Date/Time   CHOL 226 (H) 11/04/2021 1045   TRIG 94 11/04/2021 1045   HDL 67 11/04/2021 1045   CHOLHDL 3.4 11/04/2021 1045   CHOLHDL 2.9 09/04/2016 0836   VLDL 18 09/04/2016 0836   LDLCALC 143 (H) 11/04/2021 1045   LDLDIRECT 130 (H) 10/02/2020 0907   LDLDIRECT 123 (H) 11/13/2011 1652    Clinical Atherosclerotic Cardiovascular Disease (ASCVD): No  The 10-year ASCVD risk score (Arnett DK, et al., 2019) is: 20.6%   Values used to calculate the score:     Age: 46 years     Sex: Female     Is Non-Hispanic African American: Yes     Diabetic: Yes     Tobacco smoker: Yes     Systolic Blood Pressure: 254 mmHg     Is BP treated: Yes     HDL Cholesterol: 67 mg/dL     Total Cholesterol: 226 mg/dL    A/P: Diabetes longstanding and currently improved control with weight loss. Medication adherence appears good as she is tolerating semaglutide much better in the last two weeks.  -Increased dose of GLP-1 Ozempic (semaglutide) gradually by 1 click with anticipated goal dose of 0.'5mg'$  once weekly.   Patient was happy with weight loss over the last few months.  -Patient educated on purpose, proper use, and potential  adverse effects of nausea and potential for decreasing severity over time.  -Extensively discussed pathophysiology of diabetes, recommended lifestyle interventions, dietary effects on blood sugar control.  -Counseled on s/sx of and management of hypoglycemia.   ASCVD risk - primary prevention in patient with diabetes. Last LDL is not at goal of <70  mg/dL. Myalgia related intolerance to multiple statins and now ezetimibe are challenging.  -Continued Pravastatin 40 mg daily.  -Consider use of Bempedoic Acid (Nexlitol) for additional LDL lowering vs. Lipid clinic referral for possible use of PCSK-9 therapy.   Hypertension longstanding  currently with improved control and at blood pressure goal of <130/80 mmHg at visit today following addition of losartan '25mg'$  at last visit.  Patient reports no significant lower extremity swelling with amlodipine  '5mg'$  daily.   -Losartan addition likely related to benefit of Uric Acid- now at goal.   Written patient instructions provided. Patient verbalized understanding of treatment plan. Total time in face to face counseling 28 minutes.    Follow up pharmacist PRN in future.   PCP clinic visit scheduled with Dr. [name].

## 2022-01-13 NOTE — Assessment & Plan Note (Signed)
Diabetes longstanding and currently improved control with weight loss. Medication adherence appears good as she is tolerating semaglutide much better in the last two weeks.  -Increased dose of GLP-1 Ozempic (semaglutide) gradually by 1 click with anticipated goal dose of 0.'5mg'$  once weekly.   Patient was happy with weight loss over the last few months.  -Patient educated on purpose, proper use, and potential adverse effects of nausea and potential for decreasing severity over time.  -Extensively discussed pathophysiology of diabetes, recommended lifestyle interventions, dietary effects on blood sugar control.  -Counseled on s/sx of and management of hypoglycemia.

## 2022-01-13 NOTE — Patient Instructions (Signed)
Nice to see you today!  Your blood pressure is much improved.   Increase your Ozempic by 1 click per week starting today.   Follow-up with Dr. [name]

## 2022-01-13 NOTE — Assessment & Plan Note (Signed)
Hypertension longstanding currently with improved control and at blood pressure goal of <130/80 mmHg at visit today following addition of losartan '25mg'$  at last visit.  Patient reports no significant lower extremity swelling with amlodipine  '5mg'$  daily.   -Losartan addition likely related to benefit of Uric Acid- now at goal.

## 2022-01-19 ENCOUNTER — Encounter: Payer: Self-pay | Admitting: Family Medicine

## 2022-01-19 DIAGNOSIS — G4733 Obstructive sleep apnea (adult) (pediatric): Secondary | ICD-10-CM | POA: Diagnosis not present

## 2022-01-19 NOTE — Procedures (Signed)
     Patient Name: Barbara Reese, Barbara Reese Date: 01/07/2022 Gender: Female D.O.B: [birth year] Age (years): 56 Referring Provider: [name] Height (inches): 34 Interpreting Physician: Baird Lyons MD, ABSM Weight (lbs): 249 RPSGT: Zadie Rhine BMI: 40 MRN: [id number]  CLINICAL INFORMATION Sleep Study Type: NPSG Indication for sleep study: Obesity, OSA Epworth Sleepiness Score: 3  SLEEP STUDY TECHNIQUE As per the AASM Manual for the Scoring of Sleep and Associated Events v2.3 (April 2016) with a hypopnea requiring 4% desaturations.  The channels recorded and monitored were frontal, central and occipital EEG, electrooculogram (EOG), submentalis EMG (chin), nasal and oral airflow, thoracic and abdominal wall motion, anterior tibialis EMG, snore microphone, electrocardiogram, and pulse oximetry.  MEDICATIONS Medications self-administered by patient taken the night of the study : NORVASC, temovate cream, PRAVACHOL  SLEEP ARCHITECTURE The study was initiated at 9:58:31 PM and ended at 4:27:15 AM.  Sleep onset time was 26.2 minutes and the sleep efficiency was 84.2%%. The total sleep time was 327.5 minutes.  Stage REM latency was 58.0 minutes.  The patient spent 3.4%% of the night in stage N1 sleep, 65.8%% in stage N2 sleep, 0.0%% in stage N3 and 30.8% in REM.  Alpha intrusion was absent.  Supine sleep was 22.44%.  RESPIRATORY PARAMETERS The overall apnea/hypopnea index (AHI) was 13.2 per hour. There were 1 total apneas, including 1 obstructive, 0 central and 0 mixed apneas. There were 71 hypopneas and 2 RERAs.  The AHI during Stage REM sleep was 33.3 per hour.  AHI while supine was 14.7 per hour.  The mean oxygen saturation was 92.9%. The minimum SpO2 during sleep was 78.0%.  moderate snoring was noted during this study.  CARDIAC DATA The 2 lead EKG demonstrated sinus rhythm. The mean heart rate was 67.9 beats per minute. Other EKG findings include:  None.  LEG MOVEMENT DATA The total PLMS were 0 with a resulting PLMS index of 0.0. Associated arousal with leg movement index was 0.0 .  IMPRESSIONS - Mild obstructive sleep apnea occurred during this study (AHI = 13.2/h). - Oxygen desaturation was noted during this study (Min O2 = 78.0%). Mean 92.9% - The patient snored with moderate snoring volume. - No cardiac abnormalities were noted during this study. Occasional PVC - Clinically significant periodic limb movements did not occur during sleep. No significant associated arousals.  DIAGNOSIS - Obstructive Sleep Apnea (G47.33)  RECOMMENDATIONS - Suggest CPAP titration sleep study or autopap. Other options would be based on clinical judgment. - Avoid alcohol, sedatives and other CNS depressants that may worsen sleep apnea and disrupt normal sleep architecture. - Sleep hygiene should be reviewed to assess factors that may improve sleep quality. - Weight management and regular exercise should be initiated or continued if appropriate.  [Electronically signed] 01/19/2022 01:01 PM  Baird Lyons MD, [place], American Board of Sleep Medicine NPI: [id number]                        [place], [facility]  ELECTRONICALLY SIGNED ON:  01/19/2022, 12:58 PM [place] PH: [phone]   FX: [phone] [place]

## 2022-01-22 ENCOUNTER — Telehealth: Payer: Self-pay | Admitting: Family Medicine

## 2022-01-22 ENCOUNTER — Encounter: Payer: Self-pay | Admitting: Family Medicine

## 2022-01-22 NOTE — Telephone Encounter (Signed)
Community message sent to [employer] for processing.   Will await confirmation.

## 2022-01-22 NOTE — Telephone Encounter (Signed)
Patient called checking on status of FMLA paperwork. She stated the place had not received them yet. She said she is needing them faxed back as soon as possible due to it expiring on the 12th.  Thanks!

## 2022-01-23 ENCOUNTER — Encounter: Payer: Self-pay | Admitting: Family Medicine

## 2022-01-23 NOTE — Telephone Encounter (Signed)
Patient called checking the status of the paperwork. I told her it is not in the doctors box, where it was yesterday. I told her that the doctor has it. She would like a phone call when it has been completed and faxed.   Thanks!

## 2022-01-23 NOTE — Telephone Encounter (Signed)
FMLA completed. Copy to be scanned into chart, original will be faxed. Sent patient mychart msg letting her know this has been done. Admin team can you call her to let her know it's been faxed once it has? Thank you! [name], MD

## 2022-01-23 NOTE — Telephone Encounter (Signed)
Confirmation received.

## 2022-01-27 ENCOUNTER — Encounter: Payer: Self-pay | Admitting: *Deleted

## 2022-01-27 ENCOUNTER — Telehealth: Payer: Self-pay

## 2022-01-27 NOTE — Telephone Encounter (Signed)
   RN Case Manager Care Management   Phone Outreach    01/27/2022 Name: [name] MRN: [id number] DOB: 01-24-1966  Barbara Reese is a 56 y.o. year old female who is a primary care patient of [name] [name], MD .   The patient called RNCM to report that she had undergone a sleep study, and a CPAP was ordered from [employer]. However, the cost was too high for her to afford. The patient sought alternatives and was advised to contact her insurance company to inquire if they use a specific provider and copay. She acknowledged and understood the instructions given.  Follow Up Plan: CM RN will follow up with the patient at he next scheduled Interval.   Review of patient status, including review of consultants reports, relevant laboratory and other test results, and collaboration with appropriate care team members and the patient's provider was performed as part of comprehensive patient evaluation and provision of care management services.    Lazaro Arms RN, BSN, [facility] Care Management Coordinator [place] Phone: [phone] Fax: [phone]

## 2022-02-05 ENCOUNTER — Telehealth: Payer: Self-pay | Admitting: Family Medicine

## 2022-02-05 ENCOUNTER — Other Ambulatory Visit (HOSPITAL_COMMUNITY): Payer: Self-pay

## 2022-02-05 MED ORDER — OXYCODONE-ACETAMINOPHEN 10-325 MG PO TABS
1.0000 | ORAL_TABLET | Freq: Three times a day (TID) | ORAL | 0 refills | Status: DC | PRN
  Filled 2022-02-05: qty 15, 5d supply, fill #0

## 2022-02-05 NOTE — Telephone Encounter (Signed)
Oxycodone refilled.

## 2022-02-05 NOTE — Telephone Encounter (Signed)
Pt requesting refill of Percoset to [facility] pharmacy.  Foot pain returning, f/u appt made for 6/27.

## 2022-02-05 NOTE — Telephone Encounter (Signed)
Notified pt that rx refilled.

## 2022-02-07 ENCOUNTER — Other Ambulatory Visit: Payer: Self-pay | Admitting: Family Medicine

## 2022-02-07 DIAGNOSIS — R21 Rash and other nonspecific skin eruption: Secondary | ICD-10-CM

## 2022-02-09 ENCOUNTER — Other Ambulatory Visit (HOSPITAL_COMMUNITY): Payer: Self-pay

## 2022-02-09 MED ORDER — AMLODIPINE BESYLATE 5 MG PO TABS
5.0000 mg | ORAL_TABLET | Freq: Every day | ORAL | 3 refills | Status: DC
Start: 2022-02-09 — End: 2023-01-27
  Filled 2022-02-09: qty 90, 90d supply, fill #0
  Filled 2022-05-05: qty 90, 90d supply, fill #1
  Filled 2022-07-31: qty 90, 90d supply, fill #2
  Filled 2022-11-02: qty 90, 90d supply, fill #3

## 2022-02-09 MED ORDER — LOSARTAN POTASSIUM 25 MG PO TABS
25.0000 mg | ORAL_TABLET | Freq: Every day | ORAL | 3 refills | Status: DC
  Filled 2022-02-09: qty 90, 90d supply, fill #0
  Filled 2022-05-05: qty 90, 90d supply, fill #1
  Filled 2022-07-31: qty 90, 90d supply, fill #2
  Filled 2022-11-02: qty 90, 90d supply, fill #3

## 2022-02-09 MED ORDER — CLOBETASOL PROPIONATE 0.05 % EX CREA
TOPICAL_CREAM | Freq: Every day | CUTANEOUS | 0 refills | Status: DC | PRN
  Filled 2022-02-09: qty 45, 30d supply, fill #0

## 2022-02-11 ENCOUNTER — Ambulatory Visit: Payer: 59

## 2022-02-11 NOTE — Chronic Care Management (AMB) (Signed)
Care Management    RN Visit Note  02/11/2022 Name: Barbara Reese MRN: [id number] DOB: Mar 12, 1966  Subjective: Barbara Reese is a 56 y.o. year old female who is a primary care patient of Barbara Mems [name], MD. The care management team was consulted for assistance with disease management and care coordination needs.    Engaged with patient by telephone for follow up visit in response to provider referral for case management and/or care coordination services.   Consent to Services:   Barbara Reese was given information about Care Management services today including:  Care Management services includes personalized support from designated clinical staff supervised by her physician, including individualized plan of care and coordination with other care providers 24/7 contact phone numbers for assistance for urgent and routine care needs. The patient may stop case management services at any time by phone call to the office staff.  Patient agreed to services and consent obtained.    Summary:  The patient continues to maintain positive progress with care plan goals. See Care Plan below for interventions and patient self-care actives.  Recommendation: The patient may benefit from taking medications as prescribed and The patient agrees.  Follow up Plan: Patient would like continued follow-up.  CM RNCM will outreach the patient within the next 2 weeks.  Patient will call office if needed prior to next encounter    Assessment: Review of patient past medical history, allergies, medications, health status, including review of consultants reports, laboratory and other test data, was performed as part of comprehensive evaluation and provision of chronic care management services.   SDOH (Social Determinants of Health) assessments and interventions performed:  No  Care Plan    Conditions to be addressed/monitored:  CPAP  Care Plan : RN Case Manager  Updates made by Lazaro Arms, RN  since 02/11/2022 12:00 AM     Problem: (COPD)      Goal: Patient will be able to obtain a CPAP to manage her obstructive sleep apnea   Start Date: 06/03/2021  Expected End Date: 09/23/2021  Priority: High  Note:   Current Barriers:  Care Coordination needs related to Financial constraints related to obtaining a CPAP iin a patient with Obstructive sleep apnea  RNCM Clinical Goal(s):  Patient will verbalize understanding of plan for management of Obstructive sleep apnea  through collaboration with RN Care manager, provider, and care team.   Interventions: 1:1 collaboration with primary care provider regarding development and update of comprehensive plan of care as evidenced by provider attestation and co-signature Inter-disciplinary care team collaboration (see longitudinal plan of care) Evaluation of current treatment plan related to  self management and patient's adherence to plan as established by provider    06/03/2021  (Status: Goal on Track (progressing): YES.) Short term Evaluation of current treatment plan related to  Financial constraints related to CPAP for Obstructive sleep apnea  self-management and patient's adherence to plan as established by provider. Discussed plans with patient for ongoing care management follow up and provided patient with direct contact information for care management team Patient states that she will only be able to afford 200 dollars out of pocket for a CPAP machine.She said that her last sleep study was about 2 years ago 02/11/22: I talked with Barbara Reese, who said she had not heard anything from [facility]. I apologize for the wait. I told her I would personally call and send the information to them myself. I called [facility] and got their fax number, [phone], and faxed  the demographic sheet, sleep study, and doctor's note from 01/07/22 and asked if they could call the patient. I called the patient and told her I spoke with Barbara Reese at [facility] sent  the information.    -Patient/Caregiver will self-administer medications as prescribed as evidenced by self-report/primary caregiver report  -Patient/Caregiver will attend all scheduled provider appointments as evidenced by clinician review of documented attendance to scheduled appointments and patient/caregiver report -Patient/Caregiver will call pharmacy for medication refills as evidenced by patient report and review of pharmacy fill history as appropriate -Patient/Caregiver will call provider office for new concerns or questions as evidenced by review of documented incoming telephone call notes and patient report -Patient/Caregiver verbalizes understanding of plan -Patient/Caregiver will focus on medication adherence by taking medications as prescribed     Lazaro Arms RN, BSN, [place] Management Coordinator [facility]  Phone: [phone]

## 2022-02-11 NOTE — Patient Instructions (Signed)
Visit Information  [name]  it was nice speaking with you. Please call me directly [phone] if you have questions about the goals we discussed.  Care Plan : RN Case Manager  Updates made by Lazaro Arms, RN since 02/11/2022 12:00 AM     Problem: (COPD)      Goal: Patient will be able to obtain a CPAP to manage her obstructive sleep apnea   Start Date: 06/03/2021  Expected End Date: 09/23/2021  Priority: High  Note:   Current Barriers:  Care Coordination needs related to Financial constraints related to obtaining a CPAP iin a patient with Obstructive sleep apnea  RNCM Clinical Goal(s):  Patient will verbalize understanding of plan for management of Obstructive sleep apnea  through collaboration with RN Care manager, provider, and care team.   Interventions: 1:1 collaboration with primary care provider regarding development and update of comprehensive plan of care as evidenced by provider attestation and co-signature Inter-disciplinary care team collaboration (see longitudinal plan of care) Evaluation of current treatment plan related to  self management and patient's adherence to plan as established by provider    06/03/2021  (Status: Goal on Track (progressing): YES.) Short term Evaluation of current treatment plan related to  Financial constraints related to CPAP for Obstructive sleep apnea  self-management and patient's adherence to plan as established by provider. Discussed plans with patient for ongoing care management follow up and provided patient with direct contact information for care management team Patient states that she will only be able to afford 200 dollars out of pocket for a CPAP machine.She said that her last sleep study was about 2 years ago 02/11/22: I talked with Barbara Reese, who said she had not heard anything from [facility]. I apologize for the wait. I told her I would personally call and send the information to them myself. I called Barbara Reese and got their  fax number, [phone], and faxed the demographic sheet, sleep study, and doctor's note from 01/07/22 and asked if they could call the patient. I called the patient and told her I spoke with Barbara Reese at [facility] sent the information.    -Patient/Caregiver will self-administer medications as prescribed as evidenced by self-report/primary caregiver report  -Patient/Caregiver will attend all scheduled provider appointments as evidenced by clinician review of documented attendance to scheduled appointments and patient/caregiver report -Patient/Caregiver will call pharmacy for medication refills as evidenced by patient report and review of pharmacy fill history as appropriate -Patient/Caregiver will call provider office for new concerns or questions as evidenced by review of documented incoming telephone call notes and patient report -Patient/Caregiver verbalizes understanding of plan -Patient/Caregiver will focus on medication adherence by taking medications as prescribed      Patient verbalizes understanding of instructions and care plan provided today and agrees to view in [place]. Active MyChart status and patient understanding of how to access instructions and care plan via MyChart confirmed with patient.     Follow up Plan: Patient would like continued follow-up.  CM RNCM will outreach the patient within the next 2 weeks.  Patient will call office if needed prior to next encounter  Lazaro Arms, RN  [phone]

## 2022-02-13 ENCOUNTER — Other Ambulatory Visit (HOSPITAL_COMMUNITY): Payer: Self-pay

## 2022-02-13 ENCOUNTER — Other Ambulatory Visit: Payer: Self-pay | Admitting: Family Medicine

## 2022-02-16 ENCOUNTER — Other Ambulatory Visit (HOSPITAL_COMMUNITY): Payer: Self-pay

## 2022-02-16 MED ORDER — PANTOPRAZOLE SODIUM 40 MG PO TBEC
40.0000 mg | DELAYED_RELEASE_TABLET | Freq: Every day | ORAL | 3 refills | Status: DC
  Filled 2022-02-16: qty 90, 90d supply, fill #0
  Filled 2022-06-09: qty 90, 90d supply, fill #1
  Filled 2022-08-31: qty 90, 90d supply, fill #2
  Filled 2022-12-03: qty 90, 90d supply, fill #3

## 2022-02-17 ENCOUNTER — Encounter: Payer: Self-pay | Admitting: Family Medicine

## 2022-02-17 ENCOUNTER — Ambulatory Visit: Payer: 59 | Admitting: Family Medicine

## 2022-02-18 ENCOUNTER — Other Ambulatory Visit (HOSPITAL_COMMUNITY): Payer: Self-pay

## 2022-02-25 ENCOUNTER — Other Ambulatory Visit (HOSPITAL_COMMUNITY): Payer: Self-pay

## 2022-02-25 ENCOUNTER — Ambulatory Visit: Payer: 59 | Admitting: Family Medicine

## 2022-02-26 ENCOUNTER — Ambulatory Visit: Payer: 59

## 2022-02-26 NOTE — Patient Instructions (Signed)
Visit Information  Barbara Reese  it was nice speaking with you. Please call me directly [phone] if you have questions about the goals we discussed.  Patient/Caregiver will self-administer medications as prescribed as evidenced by self-report/primary caregiver report  -Patient/Caregiver will attend all scheduled provider appointments as evidenced by clinician review of documented attendance to scheduled appointments and patient/caregiver report -Patient/Caregiver will call pharmacy for medication refills as evidenced by patient report and review of pharmacy fill history as appropriate -Patient/Caregiver will call provider office for new concerns or questions as evidenced by review of documented incoming telephone call notes and patient report -Patient/Caregiver verbalizes understanding of plan -Patient/Caregiver will focus on medication adherence by taking medications as prescribed       Patient verbalizes understanding of instructions and care plan provided today and agrees to view in [place]. Active MyChart status and patient understanding of how to access instructions and care plan via MyChart confirmed with patient.     Follow up Plan: Patient would like continued follow-up.  CM RNCM will outreach the patient within the next 2 weeks.  Patient will call office if needed prior to next encounter  Lazaro Arms, RN  [phone]

## 2022-02-26 NOTE — Chronic Care Management (AMB) (Signed)
Chronic Care Management   CCM RN Visit Note  02/26/2022 Name: Barbara Reese MRN: [id number] DOB: 1965-12-04  Subjective: Barbara Reese is a 56 y.o. year old female who is a primary care patient of [name] [name], MD. The care management team was consulted for assistance with disease management and care coordination needs.    Engaged with patient by telephone for follow up visit in response to provider referral for case management and/or care coordination services.   Consent to Services:  The patient was given information about Chronic Care Management services, agreed to services, and gave verbal consent prior to initiation of services.  Please see initial visit note for detailed documentation.   Patient agreed to services and verbal consent obtained.    Summary: Barbara Reese has yet to receive any update from [facility] about the CPAP after I faxed her information on June 21st. During our phone conversation, I contacted the office at [phone] to check if they received the information. [name] informed me that she couldn't locate it, but she could retrieve it from Epic while we were speaking. . See Care Plan below for interventions and patient self-care actives.  Recommendation: The patient may benefit from  The patient willl wait to here from [facility] for the pricing information*  Follow up Plan: Patient would like continued follow-up.  CM RNCM will outreach the patient within the next 2 weeks.  Patient will call office if needed prior to next encounter   Assessment: Review of patient past medical history, allergies, medications, health status, including review of consultants reports, laboratory and other test data, was performed as part of comprehensive evaluation and provision of chronic care management services.   SDOH (Social Determinants of Health) assessments and interventions performed:  No.  CCM Care Plan  Conditions to be addressed/monitored: CPAP  Care Plan : RN  Case Manager  Updates made by Lazaro Arms, RN since 02/26/2022 12:00 AM     Problem: (COPD)      Goal: Patient will be able to obtain a CPAP to manage her obstructive sleep apnea   Start Date: 06/03/2021  Expected End Date: 03/23/2022  Priority: High  Note:   Current Barriers:  Care Coordination needs related to Financial constraints related to obtaining a CPAP iin a patient with Obstructive sleep apnea  RNCM Clinical Goal(s):  Patient will verbalize understanding of plan for management of Obstructive sleep apnea  through collaboration with RN Care manager, provider, and care team.   Interventions: 1:1 collaboration with primary care provider regarding development and update of comprehensive plan of care as evidenced by provider attestation and co-signature Inter-disciplinary care team collaboration (see longitudinal plan of care) Evaluation of current treatment plan related to  self management and patient's adherence to plan as established by provider    06/03/2021  (Status: Goal on Track (progressing): YES.) Short term Evaluation of current treatment plan related to  Financial constraints related to CPAP for Obstructive sleep apnea  self-management and patient's adherence to plan as established by provider. Discussed plans with patient for ongoing care management follow up and provided patient with direct contact information for care management team Patient states that she will only be able to afford 200 dollars out of pocket for a CPAP machine.She said that her last sleep study was about 2 years ago 02/26/22: Barbara Reese has yet to receive any update from [facility] about the CPAP after I faxed her information on June 21st. During our phone conversation, I contacted the office at  [phone] to check if they received the information. [name] informed me that she couldn't locate it, but she could retrieve it from Epic while we were speaking. They will contact the patient to discuss CPAP  pricing. I will follow up with Barbara Reese in two weeks, which will be our final call.   -Patient/Caregiver will self-administer medications as prescribed as evidenced by self-report/primary caregiver report  -Patient/Caregiver will attend all scheduled provider appointments as evidenced by clinician review of documented attendance to scheduled appointments and patient/caregiver report -Patient/Caregiver will call pharmacy for medication refills as evidenced by patient report and review of pharmacy fill history as appropriate -Patient/Caregiver will call provider office for new concerns or questions as evidenced by review of documented incoming telephone call notes and patient report -Patient/Caregiver verbalizes understanding of plan -Patient/Caregiver will focus on medication adherence by taking medications as prescribed     Lazaro Arms RN, BSN, [facility]  Phone: [phone]

## 2022-03-02 ENCOUNTER — Other Ambulatory Visit (HOSPITAL_COMMUNITY): Payer: Self-pay

## 2022-03-02 ENCOUNTER — Ambulatory Visit (INDEPENDENT_AMBULATORY_CARE_PROVIDER_SITE_OTHER): Payer: 59 | Admitting: Family Medicine

## 2022-03-02 ENCOUNTER — Other Ambulatory Visit: Payer: Self-pay

## 2022-03-02 ENCOUNTER — Encounter: Payer: Self-pay | Admitting: Family Medicine

## 2022-03-02 VITALS — BP 137/83 | HR 84 | Wt 248.0 lb

## 2022-03-02 DIAGNOSIS — G4733 Obstructive sleep apnea (adult) (pediatric): Secondary | ICD-10-CM

## 2022-03-02 DIAGNOSIS — E785 Hyperlipidemia, unspecified: Secondary | ICD-10-CM

## 2022-03-02 DIAGNOSIS — M109 Gout, unspecified: Secondary | ICD-10-CM | POA: Diagnosis not present

## 2022-03-02 DIAGNOSIS — E119 Type 2 diabetes mellitus without complications: Secondary | ICD-10-CM

## 2022-03-02 DIAGNOSIS — R21 Rash and other nonspecific skin eruption: Secondary | ICD-10-CM | POA: Diagnosis not present

## 2022-03-02 DIAGNOSIS — L430 Hypertrophic lichen planus: Secondary | ICD-10-CM

## 2022-03-02 DIAGNOSIS — I10 Essential (primary) hypertension: Secondary | ICD-10-CM | POA: Diagnosis not present

## 2022-03-02 LAB — POCT GLYCOSYLATED HEMOGLOBIN (HGB A1C): HbA1c, POC (controlled diabetic range): 5.8 % (ref 0.0–7.0)

## 2022-03-02 MED ORDER — HYDROXYZINE HCL 10 MG PO TABS
10.0000 mg | ORAL_TABLET | Freq: Two times a day (BID) | ORAL | 0 refills | Status: DC | PRN
  Filled 2022-03-02: qty 30, 15d supply, fill #0

## 2022-03-02 MED ORDER — MUPIROCIN 2 % EX OINT
1.0000 | TOPICAL_OINTMENT | Freq: Two times a day (BID) | CUTANEOUS | 0 refills | Status: AC
Start: 2022-03-02 — End: 2022-03-09
  Filled 2022-03-02: qty 22, 7d supply, fill #0

## 2022-03-02 NOTE — Patient Instructions (Signed)
It was great to see you again today!  Sent in hydroxyzine for itching Also mupirocin for your toe to try  Follow up in August for physical, sooner if needed  Be well, Dr. [name]

## 2022-03-02 NOTE — Progress Notes (Signed)
  Date of Visit: 03/02/2022   SUBJECTIVE:   HPI:  Barbara Reese presents today for routine follow up.  Diabetes - taking ozempic 0.'25mg'$  weekly, plans to go up to 0.'5mg'$  weekly tomorrow. A1c today 5.8. she is very pleased with this.  Hypertension - taking amlodipine '5mg'$  daily and losartan '25mg'$  daily. Tolerating these well.  Hyperlipidemia - tolerating pravastatin '40mg'$  daily.  Gout - taking colchicine 0.'6mg'$  daily as needed, has not needed lately. No longer on allopurinol.  Toe lesion - developed blister on her R great toe, caused some discomfort, applied neosporin  OSA - working to get CPAP through [facility] agency.  Skin rash - history of lichen planus, has upcoming appointment on 7/25 with dermatology [facility]. Using clobetasol, tries to limit where she can.   OBJECTIVE:   BP 137/83   Pulse 84   Wt 248 lb (112.5 kg)   LMP 02/09/2016 (Exact Date)   SpO2 100%   BMI 40.03 kg/m  Gen: no acute distress, pleasant, cooperative HEENT: normocephalic, atraumatic  Heart: regular rate and rhythm, no murmur Lungs: clear to auscultation bilaterally, normal work of breathing  Neuro: alert, grossly nonfocal, speech normal Ext: hyperpigmented papules/plaques on legs. Blister-like area on dorsal aspect of R great toe without erythema or drainage.     ASSESSMENT/PLAN:   Health maintenance:  -has upcoming appointment with [facility] on 8/2 for diabetic eye exam  Type 2 diabetes mellitus ([place]) Well controlled Continue ozempic Starting to see some weight benefits  OBSTRUCTIVE SLEEP APNEA Working to get cpap via [facility] agency  Lichen planus hypertrophicus Has upcoming appointment with dermatology rx hydroxyzine to help with itching in the meantime  Hypertension Well controlled. Continue current medication regimen.   Hyperlipidemia Continue statin  Gout of big toe Not on allopurinol Continue as needed colchicine Can always resume uric acid lowering medication if needed  Toe  blister Possibly from rubbing skin. Does not appear infected outwardly. Patient requested antibiotic ointment, rx for mupirocin.  FOLLOW UP: Follow up in August for Barbara Reese, Barbara Reese

## 2022-03-08 NOTE — Assessment & Plan Note (Addendum)
Continue statin. 

## 2022-03-08 NOTE — Assessment & Plan Note (Signed)
Not on allopurinol Continue as needed colchicine Can always resume uric acid lowering medication if needed

## 2022-03-08 NOTE — Assessment & Plan Note (Signed)
Working to get cpap via [facility] agency

## 2022-03-08 NOTE — Assessment & Plan Note (Signed)
Well controlled. Continue current medication regimen.  

## 2022-03-08 NOTE — Assessment & Plan Note (Signed)
Well controlled Continue ozempic Starting to see some weight benefits

## 2022-03-08 NOTE — Assessment & Plan Note (Addendum)
Has upcoming appointment with dermatology rx hydroxyzine to help with itching in the meantime

## 2022-03-09 ENCOUNTER — Other Ambulatory Visit (HOSPITAL_COMMUNITY): Payer: Self-pay

## 2022-03-10 ENCOUNTER — Other Ambulatory Visit (HOSPITAL_COMMUNITY): Payer: Self-pay

## 2022-03-11 ENCOUNTER — Other Ambulatory Visit (HOSPITAL_COMMUNITY): Payer: Self-pay

## 2022-03-12 ENCOUNTER — Other Ambulatory Visit (HOSPITAL_COMMUNITY): Payer: Self-pay

## 2022-03-12 ENCOUNTER — Ambulatory Visit: Payer: 59

## 2022-03-12 NOTE — Chronic Care Management (AMB) (Signed)
RNCM Care Management Note 03/12/2022 Name: Barbara Reese MRN: [id number] DOB: 1965/09/13   Barbara Reese is a 56 y.o. year old female who sees [name], [name], MD for primary care. RNCM was consulted  to assistance patient with  Care Coordination.      Engaged with patient by telephone for follow up visit in response to provider referral for Care Coordination.     Summary: I spoke with Mrs. Barbara Reese to follow up on her CPAP after contacting [facility]. They have scheduled an appointment for the CPAP on March 25, 2022.Marland Kitchen See Care Plan below for interventions and patient self-care actives.  Recommendation: The patient may benefit from taking medications as prescribed and follow instuctions for CPAP use it every night.*  Follow up Plan: If further intervention is needed the care management team is available to follow up after a formal CM referral is placed  and No follow up scheduled with CM team at this time   SDOH (Social Determinants of Health) screening and interventions performed today:      RN Plan of Care for Conditions/Un Met Needs Addressed and Monitored  Patient Care Plan: RN Case Manager     Problem Identified: (COPD)      Goal: Patient will be able to obtain a CPAP to manage her obstructive sleep apnea Completed 03/12/2022  Start Date: 06/03/2021  Expected End Date: 03/23/2022  Priority: High  Note:   Current Barriers:  Care Coordination needs related to Financial constraints related to obtaining a CPAP iin a patient with Obstructive sleep apnea  RNCM Clinical Goal(s):  Patient will verbalize understanding of plan for management of Obstructive sleep apnea  through collaboration with RN Care manager, provider, and care team.   Interventions: 1:1 collaboration with primary care provider regarding development and update of comprehensive plan of care as evidenced by provider attestation and co-signature Inter-disciplinary care team collaboration (see  longitudinal plan of care) Evaluation of current treatment plan related to  self management and patient's adherence to plan as established by provider    06/03/2021  (Status: Goal on Track (progressing): YES.) Short term Evaluation of current treatment plan related to  Financial constraints related to CPAP for Obstructive sleep apnea  self-management and patient's adherence to plan as established by provider. Discussed plans with patient for ongoing care management follow up and provided patient with direct contact information for care management team Patient states that she will only be able to afford 200 dollars out of pocket for a CPAP machine.She said that her last sleep study was about 2 years ago 03/12/22: I spoke with Mrs. Barbara Reese to follow up on her CPAP after contacting [facility]. They have scheduled an appointment for the CPAP on March 25, 2022. As Mrs. Barbara Reese has reached her goal, we mutually agreed to close the case and remove me from the care team.  -Patient/Caregiver will self-administer medications as prescribed as evidenced by self-report/primary caregiver report  -Patient/Caregiver will attend all scheduled provider appointments as evidenced by clinician review of documented attendance to scheduled appointments and patient/caregiver report -Patient/Caregiver will call pharmacy for medication refills as evidenced by patient report and review of pharmacy fill history as appropriate -Patient/Caregiver will call provider office for new concerns or questions as evidenced by review of documented incoming telephone call notes and patient report -Patient/Caregiver verbalizes understanding of plan -Patient/Caregiver will focus on medication adherence by taking medications as prescribed      Lazaro Arms RN, BSN, [facility]  Phone: [phone]

## 2022-03-31 ENCOUNTER — Other Ambulatory Visit (HOSPITAL_COMMUNITY): Payer: Self-pay

## 2022-04-01 ENCOUNTER — Encounter: Payer: Self-pay | Admitting: Family Medicine

## 2022-04-01 ENCOUNTER — Other Ambulatory Visit: Payer: Self-pay | Admitting: Family Medicine

## 2022-04-01 ENCOUNTER — Other Ambulatory Visit (HOSPITAL_COMMUNITY): Payer: Self-pay

## 2022-04-01 MED ORDER — OXYCODONE-ACETAMINOPHEN 10-325 MG PO TABS
1.0000 | ORAL_TABLET | Freq: Three times a day (TID) | ORAL | 0 refills | Status: DC | PRN
  Filled 2022-04-01: qty 15, 5d supply, fill #0

## 2022-04-02 ENCOUNTER — Other Ambulatory Visit (HOSPITAL_COMMUNITY): Payer: Self-pay

## 2022-04-02 ENCOUNTER — Encounter: Payer: Self-pay | Admitting: Family Medicine

## 2022-04-02 ENCOUNTER — Ambulatory Visit (INDEPENDENT_AMBULATORY_CARE_PROVIDER_SITE_OTHER): Payer: 59 | Admitting: Family Medicine

## 2022-04-02 VITALS — BP 134/88 | HR 77 | Ht 66.0 in | Wt 244.4 lb

## 2022-04-02 DIAGNOSIS — Z0000 Encounter for general adult medical examination without abnormal findings: Secondary | ICD-10-CM

## 2022-04-02 DIAGNOSIS — E119 Type 2 diabetes mellitus without complications: Secondary | ICD-10-CM

## 2022-04-02 DIAGNOSIS — L430 Hypertrophic lichen planus: Secondary | ICD-10-CM

## 2022-04-02 MED ORDER — FLUTICASONE PROPIONATE 50 MCG/ACT NA SUSP
1.0000 | Freq: Every day | NASAL | 5 refills | Status: DC
  Filled 2022-04-02 – 2022-04-28 (×2): qty 16, 60d supply, fill #0
  Filled 2022-08-19: qty 16, 60d supply, fill #1
  Filled 2022-10-23 – 2023-02-26 (×2): qty 16, 60d supply, fill #2

## 2022-04-02 NOTE — Progress Notes (Signed)
  Date of Visit: 04/02/2022   SUBJECTIVE:   HPI:  Barbara Reese presents today for a well woman exam.   Concerns today: R great toe, see below Periods: hysterectomy STD Screening: denies concerns for STD, declines screening t oday Pap smear status: n/a, hysterectomy for benign reasons Exercise: walks Smoking: no Alcohol: occasional Drugs: no Mood: good, overall doing well  R great toe - last visit had blister-like area on it, at patient's request prescribed antibiotic ointment (mupirocin) with no relief. No pain, just itches when it gets dry. Known lichen planus, uses clobetasol on LP spots. Has upcoming derm appointment on 8/25.  OBJECTIVE:   BP 134/88   Pulse 77   Ht '5\' 6"'$  (1.676 m)   Wt 244 lb 6.4 oz (110.9 kg)   LMP 02/09/2016 (Exact Date)   SpO2 100%   BMI 39.45 kg/m  Gen: NAD, pleasant, cooperative HEENT: NCAT, PERRL, no palpable thyromegaly or anterior cervical lymphadenopathy Heart: RRR, no murmurs Lungs: CTAB, NWOB Abdomen: soft, nontender to palpation Neuro: grossly nonfocal, speech normal Skin: R great toe with hyperpigmented patch/plaque on dorsal aspect of toe. No skin breakdown or maceration between toes. No drainage. Nontender.     ASSESSMENT/PLAN:   Health maintenance:  -STD screening: declines -pap smear: not indicated, hysterectomy -mammogram: UTD -lipid screening: UTD -colon cancer screening: UTD -immunizations:  Flu: will get this fall through work Tdap: UTD Shingrix: thinking about it COVID: UTD Pneumovax: UTD -handout given on health maintenance topics  Lichen planus hypertrophicus Suspect area on her toe is a manifestation of lichen planus. No obvious signs of infection (bacterial or fungal). Recommend she do trial of her clobetasol to that area to see if it improves. She will let me know if it is worsening in any way. Follow up with derm 8/25 as scheduled.  Type 2 diabetes mellitus (HCC) Doing well on ozempic, down another 4lb. Did not  tolerate 0.'5mg'$  dose so is doing 0.'25mg'$  plus 2 clicks. Advised ok to continue on this dosing. Follow up 3 months for A1c recheck. Will request eye exam records from [facility] on [address].  FOLLOW UP: Follow up in October for next A1c  [place] J. [name], [place]

## 2022-04-02 NOTE — Assessment & Plan Note (Signed)
Suspect area on her toe is a manifestation of lichen planus. No obvious signs of infection (bacterial or fungal). Recommend she do trial of her clobetasol to that area to see if it improves. She will let me know if it is worsening in any way. Follow up with derm 8/25 as scheduled.

## 2022-04-02 NOTE — Patient Instructions (Addendum)

## 2022-04-02 NOTE — Assessment & Plan Note (Signed)
Doing well on ozempic, down another 4lb. Did not tolerate 0.'5mg'$  dose so is doing 0.'25mg'$  plus 2 clicks. Advised ok to continue on this dosing. Follow up 3 months for A1c recheck. Will request eye exam records from [facility] on [address].

## 2022-04-10 LAB — HM DIABETES EYE EXAM

## 2022-04-15 ENCOUNTER — Other Ambulatory Visit (HOSPITAL_COMMUNITY): Payer: Self-pay

## 2022-04-21 ENCOUNTER — Other Ambulatory Visit (HOSPITAL_COMMUNITY): Payer: Self-pay

## 2022-04-28 ENCOUNTER — Other Ambulatory Visit (HOSPITAL_COMMUNITY): Payer: Self-pay

## 2022-04-29 ENCOUNTER — Other Ambulatory Visit: Payer: Self-pay | Admitting: Family Medicine

## 2022-04-29 ENCOUNTER — Other Ambulatory Visit (HOSPITAL_COMMUNITY): Payer: Self-pay

## 2022-04-29 DIAGNOSIS — R21 Rash and other nonspecific skin eruption: Secondary | ICD-10-CM

## 2022-05-01 ENCOUNTER — Encounter (HOSPITAL_COMMUNITY): Payer: Self-pay | Admitting: Emergency Medicine

## 2022-05-01 ENCOUNTER — Encounter: Payer: Self-pay | Admitting: Family Medicine

## 2022-05-01 ENCOUNTER — Ambulatory Visit (HOSPITAL_COMMUNITY)
Admission: EM | Admit: 2022-05-01 | Discharge: 2022-05-01 | Disposition: A | Discharge status: Home / Self Care | Payer: 59 | Attending: Nurse Practitioner | Admitting: Nurse Practitioner

## 2022-05-01 ENCOUNTER — Other Ambulatory Visit (HOSPITAL_COMMUNITY): Payer: Self-pay

## 2022-05-01 ENCOUNTER — Ambulatory Visit (INDEPENDENT_AMBULATORY_CARE_PROVIDER_SITE_OTHER): Payer: 59

## 2022-05-01 DIAGNOSIS — S91101A Unspecified open wound of right great toe without damage to nail, initial encounter: Secondary | ICD-10-CM

## 2022-05-01 DIAGNOSIS — L089 Local infection of the skin and subcutaneous tissue, unspecified: Secondary | ICD-10-CM | POA: Insufficient documentation

## 2022-05-01 DIAGNOSIS — L24A9 Irritant contact dermatitis due friction or contact with other specified body fluids: Secondary | ICD-10-CM | POA: Insufficient documentation

## 2022-05-01 DIAGNOSIS — E119 Type 2 diabetes mellitus without complications: Secondary | ICD-10-CM | POA: Diagnosis not present

## 2022-05-01 DIAGNOSIS — T148XXA Other injury of unspecified body region, initial encounter: Secondary | ICD-10-CM | POA: Diagnosis not present

## 2022-05-01 MED ORDER — AMOXICILLIN-POT CLAVULANATE 875-125 MG PO TABS
1.0000 | ORAL_TABLET | Freq: Two times a day (BID) | ORAL | 0 refills | Status: DC
  Filled 2022-05-01: qty 14, 7d supply, fill #0

## 2022-05-01 MED ORDER — AMOXICILLIN-POT CLAVULANATE 875-125 MG PO TABS: 1.0000 | ORAL_TABLET | Freq: Two times a day (BID) | ORAL | 0 refills | Status: DC

## 2022-05-01 MED ORDER — CLOBETASOL PROPIONATE 0.05 % EX CREA
TOPICAL_CREAM | CUTANEOUS | 0 refills | Status: DC
  Filled 2022-05-01: qty 45, 30d supply, fill #0

## 2022-05-01 NOTE — ED Triage Notes (Addendum)
Pt has skin problem on right big toe for couple months. PCP given two different ointments in past month or so but not relief. Pt reports pain in the big toe started a few days ago. Pt has redden and blacken discoloration skin on big toe. Pt reports areas are getting worse and sent picture to PCP who advised to go to urgent care. Sees Dermatologist this coming up Thursday.

## 2022-05-01 NOTE — Discharge Instructions (Addendum)
Right toe Xray negative  Augmentin 875 mg twice daily for 10 days Referral to wound clinic for evaluation due to prolonged healing and history of Diabetes although controlled

## 2022-05-01 NOTE — ED Provider Notes (Signed)
Barbara Reese    CSN: [id number] Arrival date & time: 05/01/22  1756      History   Chief Complaint Chief Complaint  Patient presents with   Skin Problem    HPI Barbara Reese is a 56 y.o. female.   HPI   Patient presents for evaluation of a possible skin infection to her right great toe. Onset of symptoms was gradual starting several months ago, with gradually worsening symptoms since that time. Symptoms include moderate pain and drainage, and swelling.  There is not a history of trauma to the area. She reports that started as a water blister on her toe. Treatment to date has included clobetasol cream, and mupirocin ointment with no relief. Her symptoms have worsened.  She is really concern about her toe.  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   Viral respiratory illness 11/02/2018   Acute bacterial middle ear infection, right 05/12/2018   Infective otitis externa of right ear 05/12/2018    Eustachian tube disorder 04/27/2018   Type 2 diabetes mellitus ([place]) 03/09/2018   Retrocalcaneal bursitis (back of heel), left 07/01/2017   Degenerative arthritis of left knee 03/13/2016   Lumbar radiculopathy 03/13/2016   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   Trigger thumb of right hand 01/02/2016   Situational anxiety 10/08/2014   Rectal bleeding 09/18/2014   Gout of big toe 05/23/2014   Derang of medial meniscus due to old tear/inj, unsp knee 05/17/2014   Arthritis of knee, degenerative 10/26/2013   Hypertension 12/24/2012   Physical examination of employee 12/01/2012   Arthritis 12/01/2012   Elevated LDL cholesterol level 12/16/2011   Prediabetes 12/16/2011   Depression 03/50/0938   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   ANXIETY DISORDER 11/06/2009   OBESITY, MODERATE 01/04/2009   OBSTRUCTIVE SLEEP APNEA 12/28/2008   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  Murphy   MICROLARYNGOSCOPY N/A 11/29/2017   Procedure: MICROLARYNGOSCOPY;  Surgeon: Margaretha Sheffield, MD;  Location: [facility];  Service: ENT;  Laterality: N/A;   ROBOTIC ASSISTED TOTAL HYSTERECTOMY WITH SALPINGECTOMY N/A 02/18/2016   Procedure: ROBOTIC ASSISTED TOTAL HYSTERECTOMY WITH SALPINGECTOMY;  Surgeon: Lavonia Drafts, MD;  Location: [facility];  Service: Gynecology;  Laterality: N/A;   UPPER GI ENDOSCOPY     gerd    OB History   No obstetric history on file.      Home Medications    Prior to Admission medications   Medication Sig Start  Date End Date Taking? Authorizing Provider  amoxicillin-clavulanate (AUGMENTIN) 875-125 MG tablet Take 1 tablet by mouth every 12 (twelve) hours. 05/01/22  Yes Vevelyn Francois, NP  amoxicillin-clavulanate (AUGMENTIN) 875-125 MG tablet Take 1 tablet by mouth every 12 (twelve) hours. 05/01/22  Yes Vevelyn Francois, NP  amLODipine (NORVASC) 5 MG tablet Take 1 tablet (5 mg total) by mouth at bedtime. 02/09/22   [name], MD  cetirizine (ZYRTEC) 10 MG tablet Take 10 mg by mouth daily as needed for allergies.    [provider]  clobetasol cream (TEMOVATE) 0.05 % Apply topically to affected area daily as needed. 05/01/22   [name], MD  colchicine 0.6 MG tablet Take 1 tablet (0.6 mg total) by mouth daily as needed (gout or psuedogout pain). 11/04/21   Gregor Hams, MD  fluticasone (FLONASE) 50 MCG/ACT nasal spray Place 1 spray into both nostrils daily. 04/02/22   [name], MD  hydrOXYzine (ATARAX) 10 MG tablet Take 1 tablet (10 mg total) by mouth 2 (two) times daily as needed for itching. 03/02/22   [name], MD  losartan (COZAAR) 25 MG tablet Take 1 tablet (25 mg total) by mouth daily. 02/09/22   [name], MD  oxyCODONE-acetaminophen (PERCOCET) 10-325 MG tablet Take 1 tablet by mouth every 8 (eight) hours as needed for pain. 04/01/22   Gregor Hams, MD  pantoprazole (PROTONIX) 40 MG tablet Take 1 tablet (40 mg total) by mouth daily. 02/16/22   [name], MD  pravastatin (PRAVACHOL) 40 MG tablet Take 1 tablet (40 mg total) by mouth at bedtime. 11/05/21   [name], MD  Semaglutide,0.25 or 0.'5MG'$ /DOS, (OZEMPIC, 0.25 OR 0.5 MG/DOSE,) 2 MG/3ML SOPN Inject 0.5 mg into the skin once a week. Patient taking differently: Inject 0.25 mg into the skin once a week. 0.'25mg'$  plus 2 clicks 0/35/46   Zenia Resides, MD  traMADol (ULTRAM) 50 MG tablet Take 50 mg by mouth every 6 (six) hours as needed.    [provider]    Family  History Family History  Problem Relation Age of Onset   Osteoarthritis Mother    Hypertension Mother    Allergies Mother    Rheum arthritis Mother    Diabetes Father    Hypertension Sister    Heart attack Brother    Heart attack Sister    Allergies Son    Allergies Sister    Asthma Son    Clotting disorder Sister    Breast cancer Paternal Grandmother 13    Social History Social History   Tobacco Use   Smoking status: Never   Smokeless tobacco: Never  Vaping Use   Vaping Use: Never used  Substance Use Topics   Alcohol use: Yes    Alcohol/week: 2.0 standard drinks of alcohol    Types: 2 Glasses of wine per week  Comment: wine OCC   Drug use: No     Allergies   Atorvastatin, Eggs or egg-derived products, Ezetimibe, Rosuvastatin, and Gabapentin   Review of Systems Review of Systems   Physical Exam Triage Vital Signs ED Triage Vitals  Enc Vitals Group     BP 05/01/22 1914 (!) 149/85     Pulse Rate 05/01/22 1914 73     Resp 05/01/22 1914 18     Temp 05/01/22 1914 98.9 F (37.2 C)     Temp Source 05/01/22 1914 Oral     SpO2 05/01/22 1914 97 %     Weight --      Height --      Head Circumference --      Peak Flow --      Pain Score 05/01/22 1913 9     Pain Loc --      Pain Edu? --      Excl. in [place]? --    No data found.  Updated Vital Signs BP (!) 149/85 (BP Location: Left Arm)   Pulse 73   Temp 98.9 F (37.2 C) (Oral)   Resp 18   LMP 02/09/2016 (Exact Date)   SpO2 97%   Visual Acuity Right Eye Distance:   Left Eye Distance:   Bilateral Distance:    Right Eye Near:   Left Eye Near:    Bilateral Near:     Physical Exam   UC Treatments / Results  Labs (all labs ordered are listed, but only abnormal results are displayed) Labs Reviewed  AEROBIC CULTURE W GRAM STAIN (SUPERFICIAL SPECIMEN)    EKG   Radiology DG Toe Great Right  Result Date: 05/01/2022 CLINICAL DATA:  Diabetes, nonhealing wound EXAM: RIGHT GREAT TOE COMPARISON:   None Available. FINDINGS: No fracture or dislocation is seen. The joint spaces are preserved. Soft tissue irregularity along the medial aspect of the PIP joint, likely corresponding to the patient's known soft tissue ulcer. No cortical destruction to suggest osteomyelitis. IMPRESSION: No radiographic findings of osteomyelitis. Electronically Signed   By: Julian Hy M.D.   On: 05/01/2022 20:04    Procedures Procedures (including critical care time)  Medications Ordered in UC Medications - No data to display  Initial Impression / Assessment and Plan / UC Course  I have reviewed the triage vital signs and the nursing notes.  Pertinent labs & imaging results that were available during my care of the patient were reviewed by me and considered in my medical decision making (see chart for details).     Right toe pain Final Clinical Impressions(s) / UC Diagnoses   Final diagnoses:  Nonhealing nonsurgical wound  Wound drainage  Toe infection     Discharge Instructions      Right toe Xray negative  Augmentin 875 mg twice daily for 10 days Referral to wound clinic for evaluation due to prolonged healing and history of Diabetes although controlled      ED Prescriptions     Medication Sig Dispense Auth. Provider   amoxicillin-clavulanate (AUGMENTIN) 875-125 MG tablet Take 1 tablet by mouth every 12 (twelve) hours. 14 tablet Dionisio David M, NP   amoxicillin-clavulanate (AUGMENTIN) 875-125 MG tablet Take 1 tablet by mouth every 12 (twelve) hours. 14 tablet Vevelyn Francois, NP      PDMP not reviewed this encounter.   Dionisio David [place], NP 05/01/22 2025

## 2022-05-02 ENCOUNTER — Other Ambulatory Visit (HOSPITAL_COMMUNITY): Payer: Self-pay

## 2022-05-04 LAB — AEROBIC CULTURE W GRAM STAIN (SUPERFICIAL SPECIMEN): Gram Stain: NONE SEEN

## 2022-05-05 ENCOUNTER — Telehealth: Payer: Self-pay

## 2022-05-05 ENCOUNTER — Other Ambulatory Visit (HOSPITAL_COMMUNITY): Payer: Self-pay

## 2022-05-05 ENCOUNTER — Telehealth (HOSPITAL_COMMUNITY): Payer: Self-pay | Admitting: Emergency Medicine

## 2022-05-05 MED ORDER — CLARITHROMYCIN 500 MG PO TABS
500.0000 mg | ORAL_TABLET | Freq: Two times a day (BID) | ORAL | 0 refills | Status: DC
  Filled 2022-05-05: qty 28, 14d supply, fill #0

## 2022-05-05 NOTE — Telephone Encounter (Signed)
Patient calls nurse line regarding culture results from urgent care visit on 05/01/22. She is requesting returned phone call to discuss results and next steps.   Advised patient that urgent care provider should also be reaching out to her once the provider is able to review the report. Patient asked that I send message to Dr. [name] for review.   Patient is requesting returned phone call at [phone].  Talbot Grumbling, RN

## 2022-05-05 NOTE — Telephone Encounter (Signed)
Please let patient know I reviewed her culture results and reached out to an infectious disease specialist today (Dr. Rosiland Oz). Dr. West Bali said the augmentin patient was prescribed by urgent care should be adequate to cover the bacteria that grew in her wound culture.  Thanks,  [name], MD

## 2022-05-06 ENCOUNTER — Other Ambulatory Visit (HOSPITAL_COMMUNITY): Payer: Self-pay

## 2022-05-06 ENCOUNTER — Telehealth: Payer: Self-pay | Admitting: Family Medicine

## 2022-05-06 NOTE — Telephone Encounter (Signed)
I have messaged the urgent care MD who recommended the change to clarithromycin, as I'm not sure why that happened. Will wait to hear back from them before responding to patient.   [name], MD

## 2022-05-06 NOTE — Telephone Encounter (Signed)
Called and spoke with patient. Urgent care provider sent in clarithromycin. Patient has been taking Augmentin since 05/01/22.   Patient states she spoke with pharmacist yesterday who informed her of message from Dr. [name]. Patient has further questions about which abx to take due to ID provider recommending augmentin and UC provider sending new abx. Patient has concerns about new antibiotic and wants to know if she can continue with Augmentin.   Please advise.   Talbot Grumbling, RN

## 2022-05-07 ENCOUNTER — Other Ambulatory Visit (HOSPITAL_COMMUNITY): Payer: Self-pay

## 2022-05-07 DIAGNOSIS — L438 Other lichen planus: Secondary | ICD-10-CM | POA: Diagnosis not present

## 2022-05-07 MED ORDER — HALOBETASOL PROPIONATE 0.05 % EX CREA
1.0000 "application " | TOPICAL_CREAM | CUTANEOUS | 3 refills | Status: DC
  Filled 2022-05-07: qty 15, 30d supply, fill #0
  Filled 2022-07-14: qty 50, 30d supply, fill #1
  Filled 2022-08-25 – 2022-08-31 (×2): qty 50, 30d supply, fill #2

## 2022-05-07 MED ORDER — HYDROXYZINE PAMOATE 25 MG PO CAPS
25.0000 mg | ORAL_CAPSULE | Freq: Every evening | ORAL | 2 refills | Status: DC | PRN
  Filled 2022-05-07: qty 50, 25d supply, fill #0

## 2022-05-07 NOTE — Telephone Encounter (Signed)
Patient is calling again asking to speak to Dr. [name]. I let her know what doctor was doing waiting to hear back. She still just demanded that I send a message back to doctor for her to call the patient.   Please advise.  Thanks!

## 2022-05-07 NOTE — Telephone Encounter (Signed)
Spoke with patient.  She is frustrated that she's gotten mixed messages about her toe. The pharmacist from her pharmacy had called her and said she likely didn't need the clarithromycin so they did not fill that medicine.   She saw a dermatology physician assistant today [employer] PA) who said her toe is all lichen planus, and put her on prednisone for two weeks. She finishes her last dose of augmentin today.   Empathized with patient's frustrating experiences recently. She is following up with dermatology in 1 month.   [name], MD

## 2022-05-07 NOTE — Telephone Encounter (Signed)
Patient called back and I spoke with her. She is frustrated that she's gotten mixed messages about her toe. The pharmacist from her pharmacy had called her and said she likely didn't need the clarithromycin so they did not fill that medicine.  She saw a dermatology physician assistant today [employer] PA) who said her toe is all lichen planus, and put her on prednisone for two weeks. She finishes her last dose of augmentin today.  Empathized with patient's frustrating experiences recently. She is following up with dermatology in 1 month.  [name], MD

## 2022-05-07 NOTE — Telephone Encounter (Signed)
Attempted to reach patient, called and no answer. LVM asking her to call back and I'll be happy to speak with her. I still do not have any updates from the urgent care team.  [name], MD

## 2022-05-08 ENCOUNTER — Other Ambulatory Visit (HOSPITAL_COMMUNITY): Payer: Self-pay

## 2022-05-20 ENCOUNTER — Encounter (HOSPITAL_BASED_OUTPATIENT_CLINIC_OR_DEPARTMENT_OTHER): Payer: 59 | Admitting: General Surgery

## 2022-05-22 ENCOUNTER — Other Ambulatory Visit (HOSPITAL_COMMUNITY): Payer: Self-pay

## 2022-05-22 ENCOUNTER — Encounter: Payer: Self-pay | Admitting: Family Medicine

## 2022-05-22 MED ORDER — OXYCODONE-ACETAMINOPHEN 10-325 MG PO TABS
1.0000 | ORAL_TABLET | Freq: Three times a day (TID) | ORAL | 0 refills | Status: DC | PRN
  Filled 2022-05-22: qty 15, 5d supply, fill #0

## 2022-06-09 ENCOUNTER — Other Ambulatory Visit (HOSPITAL_COMMUNITY)
Admission: RE | Admit: 2022-06-09 | Discharge: 2022-06-09 | Disposition: A | Discharge status: Home / Self Care | Payer: 59 | Source: Ambulatory Visit | Attending: Family Medicine | Admitting: Family Medicine

## 2022-06-09 ENCOUNTER — Ambulatory Visit (INDEPENDENT_AMBULATORY_CARE_PROVIDER_SITE_OTHER): Payer: 59 | Admitting: Student

## 2022-06-09 ENCOUNTER — Other Ambulatory Visit: Payer: Self-pay

## 2022-06-09 ENCOUNTER — Other Ambulatory Visit (HOSPITAL_COMMUNITY): Payer: Self-pay

## 2022-06-09 VITALS — BP 124/83 | HR 94 | Wt 245.8 lb

## 2022-06-09 DIAGNOSIS — N898 Other specified noninflammatory disorders of vagina: Secondary | ICD-10-CM | POA: Diagnosis not present

## 2022-06-09 MED ORDER — FLUCONAZOLE 150 MG PO TABS
150.0000 mg | ORAL_TABLET | Freq: Once | ORAL | 0 refills | Status: DC
  Filled 2022-06-09: qty 1, 1d supply, fill #0

## 2022-06-09 NOTE — Progress Notes (Signed)
    SUBJECTIVE:   CHIEF COMPLAINT / HPI:   56 y.o. female presents for vaginal discharge She is sexually active with one partner and inconsistent with condom use. Reports lime green vaginal discharge with itchiness but no odor.  The discharges are sporadic and Last time was one week ago and before that was a month. She see this lime green color with wiping. Patient report she was on Augmentin a month ago. No hematuria or recent change in medication.  Concerned it could be related to soap. She started using Cetafil cleansing bar which she started a month ago Of note patient has history of hysterectomy.  PERTINENT  PMH / PSH: Reviewed. Hx of Hysterectomy.  OBJECTIVE:   BP 124/83   Pulse 94   Wt 245 lb 12.8 oz (111.5 kg)   LMP 02/09/2016 (Exact Date)   SpO2 100%   BMI 39.67 kg/m    Physical Exam General: Alert, well appearing, NAD Cardiovascular: Regular rate, well-perfused Respiratory: Normal work of breathing on room air Abdomen: No distension or tenderness  Pelvic exam performed by Dr. Gwendlyn Deutscher.  Patient prefered female provider for exam.  See her addendum to the note.  ASSESSMENT/PLAN:  Vaginal discharge Patient with complain of vaginal discharge and itchiness but no odor. Her vaginal exam showed white discharges suspicious of fungi infection.  Patient's recent use of antibiotics and Ozempic put her at increased high risks of fungal infection.  We will treat with one-time dose of fluconazole 150 mg. -Obtained lab to test for GC, Chlamydia, Trichomonas, and candida infection -Rx fluconazole 150 mg x1 -Informed patient she could consider boric acid for symptom relieve -Return precaution reviewed with patient   Alen Bleacher, MD [place]

## 2022-06-09 NOTE — Patient Instructions (Addendum)
It was wonderful to meet you today. Thank you for allowing me to be a part of your care. Below is a short summary of what we discussed at your visit today:  Due to your vaginal discharge we are collecting vaginal swab today to test for bacterial infection, fungi and STI. Will follow up with you on the result if it's abnormal.  Have sent in prescription for fluconazole 150 mg which you will take once for fungi infection.  Consider using boric acid as needed for symptom relief  Please bring all of your medications to every appointment!  If you have any questions or concerns, please do not hesitate to contact us via phone or MyChart message.   Alen Bleacher, MD [facility]

## 2022-06-10 ENCOUNTER — Telehealth: Payer: Self-pay | Admitting: Student

## 2022-06-10 LAB — CERVICOVAGINAL ANCILLARY ONLY
Bacterial Vaginitis (gardnerella): NEGATIVE
Candida Glabrata: NEGATIVE
Candida Vaginitis: POSITIVE — AB
Chlamydia: NEGATIVE
Comment: NEGATIVE
Comment: NEGATIVE
Comment: NEGATIVE
Comment: NEGATIVE
Comment: NEGATIVE
Comment: NORMAL
Neisseria Gonorrhea: NEGATIVE
Trichomonas: NEGATIVE

## 2022-06-10 NOTE — Telephone Encounter (Signed)
Called patient to inform her her test was positive for Candida Vaginitis and the Diflunac medication that was sent at her visit on 06/09/22 should be sufficient to treat the infection. Return precaution discussed.

## 2022-06-11 ENCOUNTER — Other Ambulatory Visit (HOSPITAL_COMMUNITY): Payer: Self-pay

## 2022-06-23 ENCOUNTER — Other Ambulatory Visit (HOSPITAL_COMMUNITY): Payer: Self-pay

## 2022-06-23 ENCOUNTER — Other Ambulatory Visit: Payer: Self-pay | Admitting: Family Medicine

## 2022-06-23 MED ORDER — TRAMADOL HCL 50 MG PO TABS
50.0000 mg | ORAL_TABLET | Freq: Three times a day (TID) | ORAL | 0 refills | Status: DC | PRN
  Filled 2022-06-23: qty 30, 10d supply, fill #0

## 2022-06-23 NOTE — Telephone Encounter (Signed)
Takes tramadol occasionally, knows not to mix with percocet which is rx'd by her sports medicine physician Will refill (last rx 1 year ago) PDMP reviewed

## 2022-06-26 ENCOUNTER — Other Ambulatory Visit (HOSPITAL_COMMUNITY): Payer: Self-pay

## 2022-07-06 ENCOUNTER — Ambulatory Visit: Payer: 59 | Admitting: Family Medicine

## 2022-07-14 ENCOUNTER — Other Ambulatory Visit (HOSPITAL_COMMUNITY): Payer: Self-pay

## 2022-07-20 ENCOUNTER — Other Ambulatory Visit (HOSPITAL_COMMUNITY): Payer: Self-pay

## 2022-07-20 ENCOUNTER — Telehealth: Payer: Self-pay | Admitting: Family Medicine

## 2022-07-20 ENCOUNTER — Other Ambulatory Visit: Payer: Self-pay | Admitting: Family Medicine

## 2022-07-20 ENCOUNTER — Encounter: Payer: Self-pay | Admitting: Family Medicine

## 2022-07-20 NOTE — Telephone Encounter (Signed)
Patient called requesting a refill on oxyCODONE-acetaminophen (PERCOCET) 10-325 MG tablet to be sent to [place] [facility].  She is scheduled to see Dr Georgina Snell on 12/12.

## 2022-07-21 ENCOUNTER — Other Ambulatory Visit (HOSPITAL_COMMUNITY): Payer: Self-pay

## 2022-07-21 MED ORDER — OXYCODONE-ACETAMINOPHEN 10-325 MG PO TABS
1.0000 | ORAL_TABLET | Freq: Three times a day (TID) | ORAL | 0 refills | Status: DC | PRN
  Filled 2022-07-21: qty 15, 5d supply, fill #0

## 2022-07-21 MED ORDER — OXYCODONE-ACETAMINOPHEN 10-325 MG PO TABS
1.0000 | ORAL_TABLET | Freq: Three times a day (TID) | ORAL | 0 refills | Status: DC | PRN
  Filled 2022-07-21 (×2): qty 15, 5d supply, fill #0

## 2022-07-21 NOTE — Addendum Note (Signed)
Addended by: Gregor Hams on: 07/21/2022 12:28 PM   Modules accepted: Orders

## 2022-07-21 NOTE — Telephone Encounter (Signed)
Patient called stating that the [facility] did not have this script in stock but they do have it at [facility]. She asked if it could be resent to [facility].   She is on the way there now.

## 2022-07-21 NOTE — Telephone Encounter (Signed)
Medicine was refilled this morning

## 2022-07-23 ENCOUNTER — Other Ambulatory Visit (HOSPITAL_COMMUNITY): Payer: Self-pay

## 2022-07-27 ENCOUNTER — Ambulatory Visit (INDEPENDENT_AMBULATORY_CARE_PROVIDER_SITE_OTHER): Payer: 59 | Admitting: Family Medicine

## 2022-07-27 ENCOUNTER — Other Ambulatory Visit (HOSPITAL_COMMUNITY): Payer: Self-pay

## 2022-07-27 ENCOUNTER — Encounter: Payer: Self-pay | Admitting: Family Medicine

## 2022-07-27 VITALS — BP 136/86 | HR 82 | Wt 244.4 lb

## 2022-07-27 DIAGNOSIS — M2550 Pain in unspecified joint: Secondary | ICD-10-CM

## 2022-07-27 DIAGNOSIS — L430 Hypertrophic lichen planus: Secondary | ICD-10-CM | POA: Diagnosis not present

## 2022-07-27 DIAGNOSIS — I10 Essential (primary) hypertension: Secondary | ICD-10-CM | POA: Diagnosis not present

## 2022-07-27 DIAGNOSIS — M170 Bilateral primary osteoarthritis of knee: Secondary | ICD-10-CM | POA: Diagnosis not present

## 2022-07-27 MED ORDER — HYDROXYZINE PAMOATE 25 MG PO CAPS
25.0000 mg | ORAL_CAPSULE | Freq: Every evening | ORAL | 2 refills | Status: DC | PRN
  Filled 2022-07-27: qty 30, 30d supply, fill #0
  Filled 2022-08-31: qty 30, 30d supply, fill #1

## 2022-07-27 NOTE — Patient Instructions (Signed)
It was great to see you again today!  Checking labs today Continue current medications Refilled hydroxyzine Call derm office to schedule  Be well, Dr. [name]

## 2022-07-27 NOTE — Progress Notes (Unsigned)
  Date of Visit: 07/27/2022   SUBJECTIVE:   HPI:  Barbara Reese presents today for routine follow up.  Rash - has had pruritic rash on her legs for last 2-3 months. Saw dermatology previously and was told the rash on her toe was lichen planus, started on course of prednisone with improvement. Several weeks after completing the prednisone the rash became worse.  It is now on her legs, back/buttocks, and arms.  Again, it is very itchy.  She has used clobetasol as well as halobetasol prescribed by dermatology.  She was prescribed hydroxyzine but has not taken this, as she read it can be used for sedation during surgeries.  Joint pains: Recently had a flareup of pain in her left elbow.  She got her oxycodone refilled that is prescribed by Dr. Georgina Reese, her sports medicine physician.  She has an upcoming appointment scheduled with him to follow-up on this.  She does note that she has pain in multiple joints of her body.  Her mother has a history of rheumatoid arthritis.  She wonders if the joint pains and the rash could somehow be related to possible lupus.  She would like lab work drawn to evaluate for these issues.  She needs her FMLA paperwork renewed for missing work related to flareup of pain intermittently.  She would like the FMLA papers to say what it has in the past in terms of her leave amounts.  Hypertension: Currently taking losartan 25 mg daily and amlodipine 5 mg daily.  Tolerating these medications well.  OBJECTIVE:   BP 136/86   Pulse 82   Wt 244 lb 6.4 oz (110.9 kg)   LMP 02/09/2016 (Exact Date)   SpO2 100%   BMI 39.45 kg/m  Gen: no acute distress, pleasant, cooperative HEENT: normocephalic, atraumatic  Lungs: normal work of breathing  Neuro: alert speech normal, grossly nonfocal Ext: minimal lower extremity edema bilaterally Skin: Diffuse scattered hyperpigmented approximately 1 cm patches on bilateral lower extremities.  More papular type rash on the buttocks and back.  Lower  extremities consistent with lichen planus.  ASSESSMENT/PLAN:   Health maintenance:  -Declines Shingrix and COVID booster today  Lichen planus hypertrophicus Significant worsening since being on course of steroids.  Advised that this is really outside of the scope of primary care and she would be best served by seeing a dermatologist.  She will contact her dermatology office.  I did reassure her about taking hydroxyzine and that this should be safe.  I refilled it for her today.  Hypertension Well controlled. Continue current medication regimen.   Arthritis of knee, degenerative I will complete FMLA paperwork.  Given report of recent involvement of her left elbow and family history of rheumatoid arthritis, elected to obtain lab work today to evaluate for rheumatologic cause of her pain.  We will check anti-CCP, ANA, RF, sed rate, CRP.  I am okay with continuing to prescribe her tramadol which she uses occasionally for less severe pain.  She knows never to take this along with the Percocet prescribed by Dr. Georgina Reese.  She takes each of these medications sparingly.   [place]. Barbara Reese, [place]

## 2022-07-28 ENCOUNTER — Other Ambulatory Visit (HOSPITAL_COMMUNITY): Payer: Self-pay

## 2022-07-28 ENCOUNTER — Other Ambulatory Visit: Payer: 59

## 2022-07-28 DIAGNOSIS — M2550 Pain in unspecified joint: Secondary | ICD-10-CM | POA: Diagnosis not present

## 2022-07-28 NOTE — Assessment & Plan Note (Signed)
I will complete FMLA paperwork.  Given report of recent involvement of her left elbow and family history of rheumatoid arthritis, elected to obtain lab work today to evaluate for rheumatologic cause of her pain.  We will check anti-CCP, ANA, RF, sed rate, CRP.  I am okay with continuing to prescribe her tramadol which she uses occasionally for less severe pain.  She knows never to take this along with the Percocet prescribed by Dr. Georgina Snell.  She takes each of these medications sparingly.

## 2022-07-28 NOTE — Assessment & Plan Note (Addendum)
Significant worsening since being on course of steroids.  Advised that this is really outside of the scope of primary care and she would be best served by seeing a dermatologist.  She will contact her dermatology office.  I did reassure her about taking hydroxyzine and that this should be safe.  I refilled it for her today.

## 2022-07-28 NOTE — Assessment & Plan Note (Signed)
Well controlled. Continue current medication regimen.  

## 2022-07-30 LAB — CYCLIC CITRUL PEPTIDE ANTIBODY, IGG/IGA: Cyclic Citrullin Peptide Ab: <1 units (ref 0–19)

## 2022-07-30 LAB — C-REACTIVE PROTEIN: CRP: 3 mg/L (ref 0–10)

## 2022-07-30 LAB — ANA: Anti Nuclear Antibody (ANA): NEGATIVE

## 2022-07-30 LAB — RHEUMATOID FACTOR: Rheumatoid fact SerPl-aCnc: <10 IU/mL (ref <14.0)

## 2022-07-30 LAB — SEDIMENTATION RATE: Sed Rate: 39 mm/hr (ref 0–40)

## 2022-07-31 ENCOUNTER — Telehealth: Payer: Self-pay

## 2022-07-31 ENCOUNTER — Other Ambulatory Visit (HOSPITAL_COMMUNITY): Payer: Self-pay

## 2022-07-31 NOTE — Telephone Encounter (Signed)
Patient calls nurse line regarding FMLA paperwork. She states that employer faxed the paperwork to our office earlier this week.   She was calling to verify that we had received it.  Paperwork in provider box for completion.   Talbot Grumbling, RN

## 2022-08-03 NOTE — Telephone Encounter (Signed)
Have received paperwork, will work on it later today. I am inpatient attending currently  [name], MD

## 2022-08-03 NOTE — Telephone Encounter (Signed)
Patient returns call to nurse line regarding FMLA paperwork. She states that it is due on Wednesday. Advised that we have received it and we will let her know once it has been completed.   Talbot Grumbling, RN

## 2022-08-04 ENCOUNTER — Ambulatory Visit: Payer: 59 | Admitting: Family Medicine

## 2022-08-04 NOTE — Progress Notes (Deleted)
   I, Peterson Lombard, LAT, ATC acting as a scribe for Barbara Leader, MD.  Barbara Reese is a 56 y.o. female who presents to [place] at [facility] today for L elbow pain. Pt was previously seen by Dr. Georgina Snell on 11/04/21 for a gout flare in her L Great toe. Today, pt c/o L elbow pain x /. Pt locates pain to   Grip strength: Paresthesia: Aggravates: Treatments tried:   Pertinent review of systems: ***  Relevant historical information: ***   Exam:  LMP 02/09/2016 (Exact Date)  General: Well Developed, well nourished, and in no acute distress.   MSK: ***    Lab and Radiology Results No results found for this or any previous visit (from the past 72 hour(s)). No results found.     Assessment and Plan: 56 y.o. female with ***   PDMP not reviewed this encounter. No orders of the defined types were placed in this encounter.  No orders of the defined types were placed in this encounter.    Discussed warning signs or symptoms. Please see discharge instructions. Patient expresses understanding.   ***

## 2022-08-05 ENCOUNTER — Other Ambulatory Visit (HOSPITAL_COMMUNITY): Payer: Self-pay

## 2022-08-07 ENCOUNTER — Encounter: Payer: Self-pay | Admitting: Family Medicine

## 2022-08-07 NOTE — Telephone Encounter (Signed)
Forms completed, faxing back [name], MD

## 2022-08-07 NOTE — Telephone Encounter (Signed)
Sent patient a Comptroller letting her know forms are done and being sent in [name], MD

## 2022-08-18 NOTE — Progress Notes (Deleted)
   I, Peterson Lombard, LAT, ATC acting as a scribe for Lynne Leader, MD.  ARIELY RIDDELL is a 56 y.o. female who presents to [place] at [facility] today for L elbow pain. Pt was previously seen by Dr. Georgina Snell on 11/04/21 for a gout flare in her L Great toe. Today, pt c/o L elbow pain x /. Pt locates pain to   Grip strength: Paresthesia: Aggravates: Treatments tried:   Pertinent review of systems: ***  Relevant historical information: ***   Exam:  LMP 02/09/2016 (Exact Date)  General: Well Developed, well nourished, and in no acute distress.   MSK: ***    Lab and Radiology Results No results found for this or any previous visit (from the past 72 hour(s)). No results found.     Assessment and Plan: 56 y.o. female with ***   PDMP not reviewed this encounter. No orders of the defined types were placed in this encounter.  No orders of the defined types were placed in this encounter.    Discussed warning signs or symptoms. Please see discharge instructions. Patient expresses understanding.   ***

## 2022-08-19 ENCOUNTER — Other Ambulatory Visit: Payer: Self-pay | Admitting: Student

## 2022-08-19 ENCOUNTER — Ambulatory Visit: Payer: 59 | Admitting: Family Medicine

## 2022-08-19 ENCOUNTER — Other Ambulatory Visit (HOSPITAL_COMMUNITY): Payer: Self-pay

## 2022-08-19 ENCOUNTER — Telehealth: Payer: Self-pay

## 2022-08-19 ENCOUNTER — Other Ambulatory Visit: Payer: Self-pay | Admitting: Family Medicine

## 2022-08-19 DIAGNOSIS — Z1231 Encounter for screening mammogram for malignant neoplasm of breast: Secondary | ICD-10-CM

## 2022-08-19 DIAGNOSIS — N898 Other specified noninflammatory disorders of vagina: Secondary | ICD-10-CM

## 2022-08-19 MED ORDER — FLUCONAZOLE 150 MG PO TABS
150.0000 mg | ORAL_TABLET | Freq: Once | ORAL | 0 refills | Status: AC
  Filled 2022-08-19: qty 1, 1d supply, fill #0

## 2022-08-19 MED ORDER — FLUCONAZOLE 150 MG PO TABS
150.0000 mg | ORAL_TABLET | Freq: Once | ORAL | 0 refills | Status: DC
  Filled 2022-08-19: qty 1, 1d supply, fill #0

## 2022-08-19 NOTE — Telephone Encounter (Signed)
Called to inform patient of RX which she had already picked up and to inform her that if she was not seeing improvement in 3 days then she would need to make an appointment.  Barbara Reese, [place]

## 2022-08-19 NOTE — Telephone Encounter (Signed)
Patient calls nurse line requesting medication for yeast infection. She reports that she has been having vaginal itching, irritation and clear vaginal discharge since Monday.   She states that these are the symptoms she had with last yeast infection. She states that she cannot afford to come in for additional appointment.   If appropriate, please send medication to [place].   Talbot Grumbling, RN

## 2022-08-19 NOTE — Telephone Encounter (Signed)
Pls let her know I sent in diflucan to the pharmacy  If she does not improve in 3 days she should come in   Thanks  [facility]

## 2022-08-20 ENCOUNTER — Ambulatory Visit
Admission: RE | Admit: 2022-08-20 | Discharge: 2022-08-20 | Disposition: A | Discharge status: Home / Self Care | Payer: 59 | Source: Ambulatory Visit | Attending: Family Medicine | Admitting: Family Medicine

## 2022-08-20 DIAGNOSIS — Z1231 Encounter for screening mammogram for malignant neoplasm of breast: Secondary | ICD-10-CM | POA: Diagnosis not present

## 2022-08-25 ENCOUNTER — Other Ambulatory Visit: Payer: Self-pay | Admitting: Family Medicine

## 2022-08-26 ENCOUNTER — Other Ambulatory Visit (HOSPITAL_COMMUNITY): Payer: Self-pay

## 2022-08-26 MED ORDER — OXYCODONE-ACETAMINOPHEN 10-325 MG PO TABS
1.0000 | ORAL_TABLET | Freq: Three times a day (TID) | ORAL | 0 refills | Status: DC | PRN
  Filled 2022-08-26: qty 15, 5d supply, fill #0

## 2022-08-31 ENCOUNTER — Other Ambulatory Visit (HOSPITAL_COMMUNITY): Payer: Self-pay

## 2022-09-01 ENCOUNTER — Other Ambulatory Visit (HOSPITAL_COMMUNITY): Payer: Self-pay

## 2022-09-16 ENCOUNTER — Other Ambulatory Visit (HOSPITAL_COMMUNITY): Payer: Self-pay

## 2022-09-23 ENCOUNTER — Other Ambulatory Visit (HOSPITAL_COMMUNITY): Payer: Self-pay

## 2022-09-23 ENCOUNTER — Other Ambulatory Visit: Payer: Self-pay | Admitting: Family Medicine

## 2022-09-24 ENCOUNTER — Other Ambulatory Visit (HOSPITAL_COMMUNITY): Payer: Self-pay

## 2022-09-24 ENCOUNTER — Other Ambulatory Visit: Payer: Self-pay

## 2022-09-24 DIAGNOSIS — R21 Rash and other nonspecific skin eruption: Secondary | ICD-10-CM

## 2022-09-24 MED ORDER — OZEMPIC (0.25 OR 0.5 MG/DOSE) 2 MG/3ML ~~LOC~~ SOPN
0.5000 mg | PEN_INJECTOR | SUBCUTANEOUS | 3 refills | Status: DC
  Filled 2022-09-24: qty 3, 28d supply, fill #0
  Filled 2022-12-04 – 2022-12-30 (×2): qty 3, 28d supply, fill #1

## 2022-09-28 ENCOUNTER — Other Ambulatory Visit (HOSPITAL_COMMUNITY): Payer: Self-pay

## 2022-09-28 MED ORDER — CLOBETASOL PROPIONATE 0.05 % EX CREA
TOPICAL_CREAM | CUTANEOUS | 0 refills | Status: DC
  Filled 2022-09-28: qty 45, 30d supply, fill #0

## 2022-09-30 ENCOUNTER — Encounter (HOSPITAL_BASED_OUTPATIENT_CLINIC_OR_DEPARTMENT_OTHER): Payer: 59 | Attending: General Surgery | Admitting: Internal Medicine

## 2022-09-30 DIAGNOSIS — L989 Disorder of the skin and subcutaneous tissue, unspecified: Secondary | ICD-10-CM | POA: Insufficient documentation

## 2022-09-30 DIAGNOSIS — S90821A Blister (nonthermal), right foot, initial encounter: Secondary | ICD-10-CM | POA: Diagnosis not present

## 2022-09-30 DIAGNOSIS — S90822A Blister (nonthermal), left foot, initial encounter: Secondary | ICD-10-CM | POA: Diagnosis not present

## 2022-09-30 DIAGNOSIS — L438 Other lichen planus: Secondary | ICD-10-CM | POA: Diagnosis not present

## 2022-10-02 NOTE — Progress Notes (Addendum)
CIYA, FRANCKOWIAK ([id number]) [phone].pdf Page 1 of 10 Visit Report for 09/30/2022 Allergy List Details Patient Name: Date of Service: Barbara Reese, Barbara Reese 09/30/2022 1:15 PM Medical Record Number: [id number] Patient Account Number: [id number] Date of Birth/Sex: Treating RN: 04/26/1966 (57 y.o. Female) Primary Care Pahola Dimmitt: Levert Feinstein Other Clinician: Referring Lilli Dewald: Treating Kenwood Rosiak/Extender: Rebecca Eaton, Vivianne Spence in Treatment: 0 Allergies Active Allergies atorvastatin egg Severity: Moderate ezetimibe rosuvastatin gabapentin Allergy Notes Electronic Signature(s) Signed: 09/30/2022 4:14:43 PM By: Thayer Dallas Entered By: Thayer Dallas on 09/30/2022 13:22:08 -------------------------------------------------------------------------------- Arrival Information Details Patient Name: Date of Service: Barbara Reese 09/30/2022 1:15 PM Medical Record Number: [id number] Patient Account Number: [id number] Date of Birth/Sex: Treating RN: 24-Apr-1966 (57 y.o. Female) Primary Care Jaleeyah Munce: Levert Feinstein Other Clinician: Referring Virjean Boman: Treating Averleigh Savary/Extender: Rolley Sims in Treatment: 0 Visit Information Patient Arrived: Ambulatory Arrival Time: 13:10 Accompanied By: self Transfer Assistance: None Patient Identification Verified: Yes Secondary Verification Process Completed: Yes Patient Requires Transmission-Based Precautions: No Patient Has Alerts: No Electronic Signature(s) Signed: 09/30/2022 4:14:43 PM By: Thayer Dallas Entered By: Thayer Dallas on 09/30/2022 13:10:42 -------------------------------------------------------------------------------- Clinic Level of Care Assessment Details Patient Name: Date of Service: Barbara Reese, Barbara Reese 09/30/2022 1:15 PM Medical Record Number: [id number] Patient Account Number: [id number] Date of Birth/Sex: Treating RN: [birth year] (57 y.o. Female) Redmond Pulling Primary Care Peightyn Roberson: Levert Feinstein Other Clinician: Referring Berthel Bagnall: Treating Serjio Deupree/Extender: Rolley Sims in Treatment: 0 CHAN, RACZKA ([id number]) [id number] Page 2 of 10 Clinic Level of Care Assessment Items TOOL 4 Quantity Score X- 1 0 Use when only an EandM is performed on FOLLOW-UP visit ASSESSMENTS - Nursing Assessment / Reassessment X- 1 10 Reassessment of Co-morbidities (includes updates in patient status) X- 1 5 Reassessment of Adherence to Treatment Plan ASSESSMENTS - Wound and Skin A ssessment / Reassessment []  - 0 Simple Wound Assessment / Reassessment - one wound X- 2 5 Complex Wound Assessment / Reassessment - multiple wounds []  - 0 Dermatologic / Skin Assessment (not related to wound area) ASSESSMENTS - Focused Assessment X- 2 5 Circumferential Edema Measurements - multi extremities []  - 0 Nutritional Assessment / Counseling / Intervention []  - 0 Lower Extremity Assessment (monofilament, tuning fork, pulses) []  - 0 Peripheral Arterial Disease Assessment (using hand held doppler) ASSESSMENTS - Ostomy and/or Continence Assessment and Care []  - 0 Incontinence Assessment and Management []  - 0 Ostomy Care Assessment and Management (repouching, etc.) PROCESS - Coordination of Care X - Simple Patient / Family Education for ongoing care 1 15 []  - 0 Complex (extensive) Patient / Family Education for ongoing care X- 1 10 Staff obtains Chiropractor, Records, T Results / Process Orders est []  - 0 Staff telephones HHA, Nursing Homes / Clarify orders / etc []  - 0 Routine Transfer to another Facility (non-emergent condition) []  - 0 Routine Hospital Admission (non-emergent condition) X- 1 15 New Admissions / Manufacturing engineer / Ordering NPWT Apligraf, etc. , []  - 0 Emergency Hospital Admission (emergent condition) []  - 0 Simple Discharge Coordination []  - 0 Complex (extensive)  Discharge Coordination PROCESS - Special Needs []  - 0 Pediatric / Minor Patient Management []  - 0 Isolation Patient Management []  - 0 Hearing / Language / Visual special needs []  - 0 Assessment of Community assistance (transportation, D/C planning, etc.) []  - 0 Additional assistance / Altered mentation []  - 0 Support Surface(s) Assessment (bed, cushion, seat, etc.) INTERVENTIONS - Wound Cleansing / Measurement []  - 0 Simple Wound Cleansing - one wound  X- 2 5 Complex Wound Cleansing - multiple wounds X- 1 5 Wound Imaging (photographs - any number of wounds)  - 0 Wound Tracing (instead of photographs)  - 0 Simple Wound Measurement - one wound X- 2 5 Complex Wound Measurement - multiple wounds INTERVENTIONS - Wound Dressings X - Small Wound Dressing one or multiple wounds 2 10  - 0 Medium Wound Dressing one or multiple wounds Barbara Reese, Barbara Reese ([id number]) [phone].pdf Page 3 of 10  - 0 Large Wound Dressing one or multiple wounds  - 0 Application of Medications - topical  - 0 Application of Medications - injection INTERVENTIONS - Miscellaneous  - 0 External ear exam  - 0 Specimen Collection (cultures, biopsies, blood, body fluids, etc.)  - 0 Specimen(s) / Culture(s) sent or taken to Lab for analysis  - 0 Patient Transfer (multiple staff / Nurse, adult / Similar devices)  - 0 Simple Staple / Suture removal (25 or less)  - 0 Complex Staple / Suture removal (26 or more)  - 0 Hypo / Hyperglycemic Management (close monitor of Blood Glucose) X- 1 15 Ankle / Brachial Index (ABI) - do not check if billed separately X- 1 5 Vital Signs Has the patient been seen at the hospital within the last three years: Yes Total Score: 140 Level Of Care: New/Established - Level 4 Electronic Signature(s) Signed: 10/01/2022 5:19:15 PM By: Redmond Pulling RN, BSN Entered By: Redmond Pulling on 09/30/2022  14:48:51 -------------------------------------------------------------------------------- Encounter Discharge Information Details Patient Name: Date of Service: Barbara Reese 09/30/2022 1:15 PM Medical Record Number: [id number] Patient Account Number: [id number] Date of Birth/Sex: Treating RN: [birth year] (57 y.o. Female) Redmond Pulling Primary Care Zarriah Starkel: Levert Feinstein Other Clinician: Referring Mathieu Schloemer: Treating Rodneshia Greenhouse/Extender: Rolley Sims in Treatment: 0 Encounter Discharge Information Items Discharge Condition: Stable Ambulatory Status: Ambulatory Discharge Destination: Home Transportation: Private Auto Accompanied By: self Schedule Follow-up Appointment: Yes Clinical Summary of Care: Patient Declined Electronic Signature(s) Signed: 10/01/2022 5:19:15 PM By: Redmond Pulling RN, BSN Entered By: Redmond Pulling on 09/30/2022 14:49:24 -------------------------------------------------------------------------------- Lower Extremity Assessment Details Patient Name: Date of Service: Barbara Reese 09/30/2022 1:15 PM Medical Record Number: [id number] Patient Account Number: [id number] Date of Birth/Sex: Treating RN: [birth year] (57 y.o. Female) Redmond Pulling Primary Care Taniyah Ballow: Levert Feinstein Other Clinician: Referring Rubie Ficco: Treating Feige Lowdermilk/Extender: Rolley Sims in Treatment: 0 Edema Assessment Assessed: Barbara Reese: Yes] Franne Forts: Yes] [Left: Edema] Franne Forts: :] K[LeftAlease Frame ([id number])] [Right: [id number].pdf Page 4 of 10] Calf Left: Right: Point of Measurement: From Medial Instep 45.5 cm 47 cm Ankle Left: Right: Point of Measurement: From Medial Instep 25 cm 25 cm Vascular Assessment Pulses: Dorsalis Pedis Palpable: [Left:Yes] [Right:Yes] Blood Pressure: Brachial: [Left:123] [Right:123] Ankle: [Left:Dorsalis Pedis: 150 1.22] [Right:Dorsalis Pedis: 132 1.07] Electronic  Signature(s) Signed: 10/01/2022 5:19:15 PM By: Redmond Pulling RN, BSN Entered By: Redmond Pulling on 09/30/2022 13:48:00 -------------------------------------------------------------------------------- Multi Wound Chart Details Patient Name: Date of Service: Barbara Reese 09/30/2022 1:15 PM Medical Record Number: [id number] Patient Account Number: [id number] Date of Birth/Sex: Treating RN: Jan 21, 1966 (57 y.o. Female) Primary Care Madelynn Malson: Levert Feinstein Other Clinician: Referring Jacquelynn Friend: Treating Mekaela Azizi/Extender: Rebecca Eaton, Vivianne Spence in Treatment: 0 Vital Signs Height(in): 66 Pulse(bpm): 76 Weight(lbs): 248 Blood Pressure(mmHg): 123/83 Body Mass Index(BMI): 40 Temperature(F): 98.4 Respiratory Rate(breaths/min): 18 [1:Photos:] [N/A:N/A] Right, Medial Foot Left, Medial Foot N/A Wound Location: Gradually Appeared Blister N/A Wounding Event: Inflammatory Inflammatory N/A Primary Etiology: 08/24/2021 08/24/2021 N/A Date Acquired: 0 0 N/A Weeks of  Treatment: Open Open N/A Wound Status: No No N/A Wound Recurrence: 4.6x4.5x0.1 6.3x6x0.1 N/A Measurements L x W x D (cm) 16.258 29.688 N/A A (cm) : rea 1.626 2.969 N/A Volume (cm) : Partial Thickness Full Thickness Without Exposed N/A Classification: Support Structures Small Medium N/A Exudate A mount: Serous Serous N/A Exudate Type: amber amber N/A Exudate Color: None Present (0%) None Present (0%) N/A Granulation Amount: Large (67-100%) Large (67-100%) N/A Necrotic Amount: Eschar, Adherent Slough Eschar, Adherent Slough N/A Necrotic Tissue: Fat Layer (Subcutaneous Tissue): Yes Fat Layer (Subcutaneous Tissue): Yes N/A Exposed Structures: Fascia: No Fascia: No Tendon: No Tendon: No Muscle: No Muscle: No Joint: No Joint: No MARGARITE, VESSEL ([id number]) [id number].pdf Page 5 of 10 Bone: No Bone: No None None N/A Epithelialization: Treatment Notes Wound #1  (Foot) Wound Laterality: Right, Medial Cleanser Peri-Wound Care Topical Primary Dressing Secondary Dressing Secured With Compression Wrap Compression Stockings Add-Ons Wound #2 (Foot) Wound Laterality: Left, Medial Cleanser Peri-Wound Care Topical Primary Dressing Secondary Dressing Secured With Compression Wrap Compression Stockings Add-Ons Electronic Signature(s) Signed: 09/30/2022 3:38:34 PM By: Baltazar Najjar MD Entered By: Baltazar Najjar on 09/30/2022 15:02:07 -------------------------------------------------------------------------------- Multi-Disciplinary Care Plan Details Patient Name: Date of Service: Barbara Reese 09/30/2022 1:15 PM Medical Record Number: [id number] Patient Account Number: [id number] Date of Birth/Sex: Treating RN: June 23, 1966 (57 y.o. Female) Redmond Pulling Primary Care Beatris Belen: Levert Feinstein Other Clinician: Referring Meshia Rau: Treating Correne Lalani/Extender: Rolley Sims in Treatment: 0 Active Inactive Electronic Signature(s) Signed: 10/01/2022 5:19:15 PM By: Redmond Pulling RN, BSN Entered By: Redmond Pulling on 09/30/2022 16:14:13 -------------------------------------------------------------------------------- Non-Wound Condition Assessment Details Patient Name: Date of Service: Barbara Reese 09/30/2022 1:15 PM Medical Record Number: [id number] Patient Account Number: [id number] Date of Birth/Sex: Treating RN: 04-30-1966 (58 y.o. Female) Redmond Pulling Primary Care Clarene Curran: Levert Feinstein Other Clinician: MONIKA, Barbara Reese ([id number]) [id number] Page 6 of 10 Referring Dora Simeone: Treating Markia Kyer/Extender: Rebecca Eaton, Vivianne Spence in Treatment: 0 Non-Wound Condition: Condition: Other Dermatologic Condition Location: Foot Side: Right Photos Periwound Skin Texture Texture Color No Abnormalities Noted: No No Abnormalities Noted: No Moisture No Abnormalities Noted:  No Electronic Signature(s) Signed: 10/01/2022 5:19:15 PM By: Redmond Pulling RN, BSN Entered By: Redmond Pulling on 09/30/2022 14:23:32 -------------------------------------------------------------------------------- Non-Wound Condition Assessment Details Patient Name: Date of Service: Barbara Reese 09/30/2022 1:15 PM Medical Record Number: [id number] Patient Account Number: [id number] Date of Birth/Sex: Treating RN: [birth year] (57 y.o. Female) Redmond Pulling Primary Care Hamda Klutts: Levert Feinstein Other Clinician: Referring Dariane Natzke: Treating Kyjuan Gause/Extender: Rolley Sims in Treatment: 0 Non-Wound Condition: Condition: Other Dermatologic Condition Location: Foot Side: Left Notes: Lichen pantus Photos Periwound Skin Texture Texture Color No Abnormalities Noted: No No Abnormalities Noted: No Moisture No Abnormalities Noted: No Electronic Signature(sSUEANN, Barbara Reese ([id number]) [phone].pdf Page 7 of 10 Signed: 10/01/2022 5:19:15 PM By: Redmond Pulling RN, BSN Entered By: Redmond Pulling on 09/30/2022 14:24:25 -------------------------------------------------------------------------------- Pain Assessment Details Patient Name: Date of Service: Barbara Reese, Barbara Reese 09/30/2022 1:15 PM Medical Record Number: [id number] Patient Account Number: [id number] Date of Birth/Sex: Treating RN: Aug 23, 1966 (57 y.o. Female) Redmond Pulling Primary Care Brookelynn Hamor: Levert Feinstein Other Clinician: Referring Ilias Stcharles: Treating Elan Mcelvain/Extender: Rolley Sims in Treatment: 0 Active Problems Location of Pain Severity and Description of Pain Patient Has Paino No Site Locations Pain Management and Medication Current Pain Management: Electronic Signature(s) Signed: 10/01/2022 5:19:15 PM By: Redmond Pulling RN, BSN Entered By: Redmond Pulling on 09/30/2022  13:49:27 -------------------------------------------------------------------------------- Patient/Caregiver Education Details Patient Name: Date of  Service: Barbara Reese 2/7/2024andnbsp1:15 PM Medical Record Number: [id number] Patient Account Number: [id number] Date of Birth/Gender: Treating RN: 09/28/1965 (57 y.o. Female) Redmond Pulling Primary Care Physician: Levert Feinstein Other Clinician: Referring Physician: Treating Physician/Extender: Rolley Sims in Treatment: 0 Education Assessment Education Provided To: Patient Education Topics Provided Wound/Skin Impairment: Methods: Explain/Verbal Responses: State content correctly Electronic Signature(s) Signed: 10/01/2022 5:19:15 PM By: Redmond Pulling RN, BSN Minneiska, Larena Sox ([id number]) [id number] Page 8 of 10 Entered By: Redmond Pulling on 09/30/2022 14:47:26 -------------------------------------------------------------------------------- Wound Assessment Details Patient Name: Date of Service: Barbara Reese, Barbara Reese 09/30/2022 1:15 PM Medical Record Number: [id number] Patient Account Number: [id number] Date of Birth/Sex: Treating RN: [birth year] (57 y.o. Female) Redmond Pulling Primary Care Malika Demario: Levert Feinstein Other Clinician: Referring Collen Hostler: Treating Talulah Schirmer/Extender: Rolley Sims in Treatment: 0 Wound Status Wound Number: 1 Primary Etiology: Inflammatory Wound Location: Right, Medial Foot Wound Status: Open Wounding Event: Gradually Appeared Date Acquired: 08/24/2021 Weeks Of Treatment: 0 Clustered Wound: No Photos Wound Measurements Length: (cm) 4.6 Width: (cm) 4.5 Depth: (cm) 0.1 Area: (cm) 16.258 Volume: (cm) 1.626 % Reduction in Area: % Reduction in Volume: Epithelialization: None Tunneling: No Undermining: No Wound Description Classification: Partial Thickness Exudate Amount: Small Exudate Type: Serous Exudate Color:  amber Foul Odor After Cleansing: No Slough/Fibrino Yes Wound Bed Granulation Amount: None Present (0%) Exposed Structure Necrotic Amount: Large (67-100%) Fascia Exposed: No Necrotic Quality: Eschar, Adherent Slough Fat Layer (Subcutaneous Tissue) Exposed: Yes Tendon Exposed: No Muscle Exposed: No Joint Exposed: No Bone Exposed: No Periwound Skin Texture Texture Color No Abnormalities Noted: No No Abnormalities Noted: No Moisture No Abnormalities Noted: No Electronic Signature(s) Signed: 10/01/2022 5:19:15 PM By: Redmond Pulling RN, BSN Entered By: Redmond Pulling on 09/30/2022 13:37:22 Wound Assessment Details -------------------------------------------------------------------------------- Barbara Reese ([id number]) [id number].pdf Page 9 of 10 Patient Name: Date of Service: Barbara Reese, Barbara Reese 09/30/2022 1:15 PM Medical Record Number: [id number] Patient Account Number: [id number] Date of Birth/Sex: Treating RN: 05/14/1966 (57 y.o. Female) Redmond Pulling Primary Care Anyra Kaufman: Levert Feinstein Other Clinician: Referring Dorethea Strubel: Treating Rosemaria Inabinet/Extender: Rolley Sims in Treatment: 0 Wound Status Wound Number: 2 Primary Etiology: Inflammatory Wound Location: Left, Medial Foot Wound Status: Open Wounding Event: Blister Date Acquired: 08/24/2021 Weeks Of Treatment: 0 Clustered Wound: No Photos Wound Measurements Length: (cm) 6.3 % Reduction in Area: Width: (cm) 6 % Reduction in Volume: Depth: (cm) 0.1 Epithelialization: None Area: (cm) 29.688 Tunneling: No Volume: (cm) 2.969 Undermining: No Wound Description Classification: Full Thickness Without Exposed Support Structures Foul Odor After Cleansing: No Exudate Amount: Medium Slough/Fibrino Yes Exudate Type: Serous Exudate Color: amber Wound Bed Granulation Amount: None Present (0%) Exposed Structure Necrotic Amount: Large (67-100%) Fascia Exposed: No Necrotic  Quality: Eschar, Adherent Slough Fat Layer (Subcutaneous Tissue) Exposed: Yes Tendon Exposed: No Muscle Exposed: No Joint Exposed: No Bone Exposed: No Periwound Skin Texture Texture Color No Abnormalities Noted: No No Abnormalities Noted: No Moisture No Abnormalities Noted: No Electronic Signature(s) Signed: 10/01/2022 5:19:15 PM By: Redmond Pulling RN, BSN Entered By: Redmond Pulling on 09/30/2022 13:39:09 -------------------------------------------------------------------------------- Vitals Details Patient Name: Date of Service: Barbara Reese 09/30/2022 1:15 PM Medical Record Number: [id number] Patient Account Number: [id number] Date of Birth/Sex: Treating RN: February 25, 1966 (57 y.o. Female) Primary Care Serge Main: Levert Feinstein Other Clinician: Referring Adryanna Friedt: Treating Judithann Villamar/Extender: Rebecca Eaton, Vivianne Spence in Treatment: 0 Barbara Reese, Barbara Reese ([id number]) [id number] Page 10 of 10 Vital Signs Time Taken: 13:11 Temperature (F): 98.4 Height (in): 66 Pulse (bpm):  76 Source: Stated Respiratory Rate (breaths/min): 18 Weight (lbs): 248 Blood Pressure (mmHg): 123/83 Source: Stated Reference Range: 80 - 120 mg / dl Body Mass Index (BMI): 40 Electronic Signature(s) Signed: 09/30/2022 4:14:43 PM By: Thayer Dallas Entered By: Thayer Dallas on 09/30/2022 13:17:00

## 2022-10-02 NOTE — Progress Notes (Addendum)
Barbara Reese, Barbara Reese ([id number]) [id number] Page 1 of 4 Visit Report for 09/30/2022 Abuse Risk Screen Details Patient Name: Date of Service: Barbara Reese, Barbara Reese 09/30/2022 1:15 PM Medical Record Number: [id number] Patient Account Number: [id number] Date of Birth/Sex: Treating RN: [birth year] (57 y.o. Female) Primary Care Aahan Marques: Levert Feinstein Other Clinician: Referring Uzziah Rigg: Treating Eisley Barber/Extender: Rolley Sims in Treatment: 0 Abuse Risk Screen Items Answer ABUSE RISK SCREEN: Has anyone close to you tried to hurt or harm you recentlyo No Do you feel uncomfortable with anyone in your familyo No Has anyone forced you do things that you didnt want to doo No Electronic Signature(s) Signed: 09/30/2022 4:14:43 PM By: Thayer Dallas Entered By: Thayer Dallas on 09/30/2022 13:41:46 -------------------------------------------------------------------------------- Activities of Daily Living Details Patient Name: Date of Service: Barbara Reese, Barbara Reese 09/30/2022 1:15 PM Medical Record Number: [id number] Patient Account Number: [id number] Date of Birth/Sex: Treating RN: 1966/01/28 (57 y.o. Female) Primary Care Macallan Ord: Levert Feinstein Other Clinician: Referring Darnise Montag: Treating Jaydian Santana/Extender: Rebecca Eaton, Vivianne Spence in Treatment: 0 Activities of Daily Living Items Answer Activities of Daily Living (Please select one for each item) Drive Automobile Completely Able T Medications ake Completely Able Use T elephone Completely Able Care for Appearance Completely Able Use T oilet Completely Able Bath / Shower Completely Able Dress Self Completely Able Feed Self Completely Able Walk Completely Able Get In / Out Bed Completely Able Housework Completely Able Prepare Meals Completely Able Handle Money Completely Able Shop for Self Completely Able Electronic Signature(s) Signed: 09/30/2022 4:14:43 PM By: Thayer Dallas Entered By: Thayer Dallas on 09/30/2022 13:42:13 -------------------------------------------------------------------------------- Education Screening Details Patient Name: Date of Service: Barbara Reese 09/30/2022 1:15 PM Medical Record Number: [id number] Patient Account Number: [id number] Date of Birth/Sex: Treating RN: 1966-02-09 (57 y.o. Female) Primary Care Ajane Novella: Levert Feinstein Other Clinician: Referring Bette Brienza: Treating Maximiliano Cromartie/Extender: Rolley Sims in Treatment: 0 Barbara Reese, Barbara Reese ([id number]) [phone].pdf Page 2 of 4 Primary Learner Assessed: Patient Learning Preferences/Education Level/Primary Language Learning Preference: Explanation, Demonstration, Printed Material Highest Education Level: College or Above Preferred Language: Economist Language Barrier: No Translator Needed: No Memory Deficit: No Emotional Barrier: No Cultural/Religious Beliefs Affecting Medical Care: No Physical Barrier Impaired Vision: Yes Glasses Impaired Hearing: No Decreased Hand dexterity: No Knowledge/Comprehension Knowledge Level: High Comprehension Level: High Ability to understand written instructions: High Ability to understand verbal instructions: High Motivation Anxiety Level: Calm Cooperation: Cooperative Education Importance: Acknowledges Need Interest in Health Problems: Asks Questions Perception: Coherent Willingness to Engage in Self-Management High Activities: Readiness to Engage in Self-Management High Activities: Electronic Signature(s) Signed: 09/30/2022 4:14:43 PM By: Thayer Dallas Entered By: Thayer Dallas on 09/30/2022 13:42:51 -------------------------------------------------------------------------------- Fall Risk Assessment Details Patient Name: Date of Service: Barbara Reese 09/30/2022 1:15 PM Medical Record Number: [id number] Patient Account Number:  [id number] Date of Birth/Sex: Treating RN: 1966/08/06 (57 y.o. Female) Primary Care Laken Rog: Levert Feinstein Other Clinician: Referring Londynn Sonoda: Treating Skyylar Kopf/Extender: Rebecca Eaton, Vivianne Spence in Treatment: 0 Fall Risk Assessment Items Have you had 2 or more falls in the last 12 monthso 0 No Have you had any fall that resulted in injury in the last 12 monthso 0 No FALLS RISK SCREEN History of falling - immediate or within 3 months 0 No Secondary diagnosis (Do you have 2 or more medical diagnoseso) 0 No Ambulatory aid None/bed rest/wheelchair/nurse 0 Yes Crutches/cane/walker 0 No Furniture 0 No Intravenous therapy Access/Saline/Heparin Lock 0 No Gait/Transferring Normal/ bed rest/ wheelchair 0 Yes  Weak (short steps with or without shuffle, stooped but able to lift head while walking, may seek 0 No support from furniture) Impaired (short steps with shuffle, may have difficulty arising from chair, head down, impaired 0 No balance) Mental Status Oriented to own ability 0 Yes Overestimates or forgets limitations 0 No Risk Level: Low Risk Score: 0 Barbara Reese, Barbara Reese ([id number]) [phone] [id number] Page 3 of 4 Electronic Signature(s) -------------------------------------------------------------------------------- Foot Assessment Details Patient Name: Date of Service: Barbara Reese, Barbara Reese 09/30/2022 1:15 PM Medical Record Number: [id number] Patient Account Number: [id number] Date of Birth/Sex: Treating RN: [birth year] (57 y.o. Female) Redmond Pulling Primary Care Olivianna Higley: Levert Feinstein Other Clinician: Referring Ashanna Heinsohn: Treating Krishika Bugge/Extender: Rolley Sims in Treatment: 0 Foot Assessment Items Site Locations + = Sensation present, - = Sensation absent, C = Callus, U = Ulcer R = Redness, W = Warmth, M = Maceration, PU = Pre-ulcerative lesion F = Fissure, S = Swelling, D = Dryness Assessment Right: Left: Other  Deformity: No No Prior Foot Ulcer: No No Prior Amputation: No No Charcot Joint: No No Ambulatory Status: Gait: Electronic Signature(s) Signed: 10/01/2022 5:19:15 PM By: Redmond Pulling RN, BSN Entered By: Redmond Pulling on 09/30/2022 13:49:16 -------------------------------------------------------------------------------- Nutrition Risk Screening Details Patient Name: Date of Service: Barbara Reese 09/30/2022 1:15 PM Medical Record Number: [id number] Patient Account Number: [id number] Date of Birth/Sex: Treating RN: 06/17/1966 (56 y.o. Female) Primary Care Nafeesa Dils: Levert Feinstein Other Clinician: Referring Agustin Swatek: Treating Analaura Messler/Extender: Rebecca Eaton, Vivianne Spence in Treatment: 0 Height (in): 66 Weight (lbs): 248 Body Mass Index (BMI): 40 Barbara Reese, Barbara Reese ([id number]) [phone] [id number] Page 4 of 4 Nutrition Risk Screening Items Score Screening NUTRITION RISK SCREEN: I have an illness or condition that made me change the kind and/or amount of food I eat 0 No I eat fewer than two meals per day 0 No I eat few fruits and vegetables, or milk products 2 Yes I have three or more drinks of beer, liquor or wine almost every day 0 No I have tooth or mouth problems that make it hard for me to eat 0 No I don't always have enough money to buy the food I need 0 No I eat alone most of the time 0 No I take three or more different prescribed or over-the-counter drugs a day 1 Yes Without wanting to, I have lost or gained 10 pounds in the last six months 2 Yes I am not always physically able to shop, cook and/or feed myself 0 No Nutrition Protocols Good Risk Protocol Moderate Risk Protocol 0 Provide education on nutrition High Risk Proctocol Risk Level: Moderate Risk Score: 5 Electronic Signature(s) Signed: 09/30/2022 4:14:43 PM By: Thayer Dallas Entered By: Thayer Dallas on 09/30/2022 13:44:34

## 2022-10-03 ENCOUNTER — Other Ambulatory Visit: Payer: Self-pay | Admitting: Family Medicine

## 2022-10-05 ENCOUNTER — Telehealth: Payer: Self-pay

## 2022-10-05 ENCOUNTER — Other Ambulatory Visit (HOSPITAL_COMMUNITY): Payer: Self-pay

## 2022-10-05 MED ORDER — OXYCODONE-ACETAMINOPHEN 10-325 MG PO TABS
1.0000 | ORAL_TABLET | Freq: Three times a day (TID) | ORAL | 0 refills | Status: DC | PRN
  Filled 2022-10-05: qty 15, 5d supply, fill #0

## 2022-10-05 NOTE — Telephone Encounter (Signed)
Last refill 08/31/22 Qty: 15/0 Last OV 11/04/21 Next OV not scheduled  Controlled substance, forwarding to Dr. Georgina Snell.

## 2022-10-05 NOTE — Telephone Encounter (Signed)
Patient called needing a refill of the percocet. Pharmacy [facility]

## 2022-10-05 NOTE — Telephone Encounter (Signed)
Meds refilled.

## 2022-10-07 DIAGNOSIS — I301 Infective pericarditis: Secondary | ICD-10-CM | POA: Diagnosis not present

## 2022-10-07 DIAGNOSIS — L438 Other lichen planus: Secondary | ICD-10-CM | POA: Diagnosis not present

## 2022-10-13 NOTE — Progress Notes (Unsigned)
   I, Peterson Lombard, LAT, ATC acting as a scribe for Lynne Leader, MD.  Barbara Reese is a 57 y.o. female who presents to [place] at [facility] today for L shoulder pain. Pt was previously seen by Dr. Georgina Snell on 11/04/21 for a gout flare in her L Great toe. Today, pt c/o L shoulder pain x /. Pt locates pain to   Radiates:  UE Numbness/tingling: UE Weakness: Aggravates: Treatments tried:  Pertinent review of systems: ***  Relevant historical information: ***   Exam:  LMP 02/09/2016 (Exact Date)  General: Well Developed, well nourished, and in no acute distress.   MSK: ***    Lab and Radiology Results No results found for this or any previous visit (from the past 72 hour(s)). No results found.     Assessment and Plan: 57 y.o. female with ***   PDMP not reviewed this encounter. No orders of the defined types were placed in this encounter.  No orders of the defined types were placed in this encounter.    Discussed warning signs or symptoms. Please see discharge instructions. Patient expresses understanding.   ***

## 2022-10-14 ENCOUNTER — Other Ambulatory Visit (HOSPITAL_COMMUNITY): Payer: Self-pay

## 2022-10-14 ENCOUNTER — Encounter: Payer: Self-pay | Admitting: Family Medicine

## 2022-10-14 ENCOUNTER — Ambulatory Visit (INDEPENDENT_AMBULATORY_CARE_PROVIDER_SITE_OTHER): Payer: 59

## 2022-10-14 ENCOUNTER — Ambulatory Visit: Payer: Self-pay

## 2022-10-14 ENCOUNTER — Ambulatory Visit (INDEPENDENT_AMBULATORY_CARE_PROVIDER_SITE_OTHER): Payer: 59 | Admitting: Family Medicine

## 2022-10-14 VITALS — BP 120/78 | HR 75 | Ht 66.0 in | Wt 248.0 lb

## 2022-10-14 DIAGNOSIS — M25512 Pain in left shoulder: Secondary | ICD-10-CM | POA: Diagnosis not present

## 2022-10-14 DIAGNOSIS — G8929 Other chronic pain: Secondary | ICD-10-CM

## 2022-10-14 DIAGNOSIS — M7532 Calcific tendinitis of left shoulder: Secondary | ICD-10-CM | POA: Diagnosis not present

## 2022-10-14 MED ORDER — OXYCODONE-ACETAMINOPHEN 10-325 MG PO TABS
1.0000 | ORAL_TABLET | Freq: Three times a day (TID) | ORAL | 0 refills | Status: DC | PRN
  Filled 2022-10-14: qty 15, 5d supply, fill #0

## 2022-10-14 NOTE — Patient Instructions (Signed)
Thank you for coming in today.   Please get an Xray today before you leave   I've referred you to Physical Therapy.  Let us know if you don't hear from them in one week.   You received an injection today. Seek immediate medical attention if the joint becomes red, extremely painful, or is oozing fluid.   Check back in 6 weeks

## 2022-10-16 NOTE — Progress Notes (Signed)
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

## 2022-10-27 ENCOUNTER — Encounter (HOSPITAL_BASED_OUTPATIENT_CLINIC_OR_DEPARTMENT_OTHER): Payer: Commercial Managed Care - PPO | Admitting: General Surgery

## 2022-10-29 ENCOUNTER — Encounter: Payer: Self-pay | Admitting: Family Medicine

## 2022-10-29 ENCOUNTER — Ambulatory Visit (INDEPENDENT_AMBULATORY_CARE_PROVIDER_SITE_OTHER): Payer: 59 | Admitting: Family Medicine

## 2022-10-29 ENCOUNTER — Other Ambulatory Visit (HOSPITAL_COMMUNITY): Payer: Self-pay

## 2022-10-29 ENCOUNTER — Encounter: Payer: Self-pay | Admitting: Student

## 2022-10-29 VITALS — BP 104/70 | HR 74 | Temp 98.6°F | Ht 66.0 in | Wt 241.0 lb

## 2022-10-29 DIAGNOSIS — U071 COVID-19: Secondary | ICD-10-CM

## 2022-10-29 DIAGNOSIS — J069 Acute upper respiratory infection, unspecified: Secondary | ICD-10-CM | POA: Diagnosis not present

## 2022-10-29 LAB — POC SOFIA 2 FLU + SARS ANTIGEN FIA
Influenza A, POC: NEGATIVE
Influenza B, POC: NEGATIVE
SARS Coronavirus 2 Ag: POSITIVE — AB

## 2022-10-29 MED ORDER — BENZONATATE 100 MG PO CAPS
100.0000 mg | ORAL_CAPSULE | Freq: Two times a day (BID) | ORAL | 0 refills | Status: DC | PRN
  Filled 2022-10-29: qty 20, 10d supply, fill #0

## 2022-10-29 MED ORDER — PAXLOVID (300/100) 20 X 150 MG & 10 X 100MG PO TBPK
ORAL_TABLET | ORAL | 0 refills | Status: DC
  Filled 2022-10-29: qty 30, 5d supply, fill #0

## 2022-10-29 NOTE — Patient Instructions (Addendum)
It was wonderful to see you today. Thank you for allowing me to be a part of your care. Below is a short summary of what we discussed at your visit today:  Cough and congestion You are positive for COVID.  I prescribed a medication called Paxlovid to help treat the COVID and Tessalon Perles to help with cough.  IMPORTANT While taking the Paxlovid, do NOT take the following medications:  Colchicine Hydroxyzine Oxycodone Tramadol  While taking the Paxlovid, I want you to cut your amlodipine tablet in half. You can return to your full dose amlodipine after you stop taking Paxlovid.  Medications you are taking that are safe to take with Paxlovid: Losartan Pantoprazole Pravastatin Semaglutide or Ozempic  Health Maintenance Please book an appointment with your PCP at your convenience to perform some health maintenance activities and keep you healthy.  You are due for a creatinine check (check in on your kidneys function), check of your A1c for your diabetes.  We can also offer you your COVID booster and your shingles vaccines.  Please schedule at your convenience.   If you have any questions or concerns, please do not hesitate to contact us via phone or MyChart message.   Ezequiel Essex, MD

## 2022-10-29 NOTE — Progress Notes (Signed)
    SUBJECTIVE:   CHIEF COMPLAINT / HPI:   URI symptoms Started Monday. Experiencing dry cough, headache, scratchy dry throat, fatigue, and lack of taste and smell.  Partner at home experiencing the same symptoms with similar timeline.  Has tried Zyrtec, over-the-counter cold and sinus medications, Tylenol, honey, lemon, ginger, and cough drops with only mild relief.  Reduced appetite but normal p.o. intake and normal UOP.  Does have risk factors for hospitalization if positive for flu or COVID, including OSA (not on CPAP), obesity, HTN, T2DM.  Discussed point-of-care testing, patient amenable.  PERTINENT  PMH / PSH: OSA, HTN, T2DM, GERD  OBJECTIVE:   BP 104/70   Pulse 74   Temp 98.6 F (37 C)   Ht '5\' 6"'$  (1.676 m)   Wt 241 lb (109.3 kg)   LMP 02/09/2016 (Exact Date)   SpO2 98%   BMI 38.90 kg/m     PHQ-9:     10/29/2022    9:29 AM 06/09/2022    3:05 PM 04/02/2022   11:48 AM  Depression screen PHQ 2/9  Decreased Interest 0 0 0  Down, Depressed, Hopeless 0 0 0  PHQ - 2 Score 0 0 0  Altered sleeping 0 0 0  Tired, decreased energy '1 1 1  '$ Change in appetite 1 0 0  Feeling bad or failure about yourself  0 0 0  Trouble concentrating 0 0 0  Moving slowly or fidgety/restless 0 0 0  Suicidal thoughts 0 0 0  PHQ-9 Score '2 1 1  '$ Difficult doing work/chores  Not difficult at all     Physical Exam General: Awake, alert, oriented HEENT: PERRL, bilateral TM pearly pink and flat, bilateral external auditory canals with minimal cerumen burden, no lesions, nasal mucosa slightly edematous, oral mucosa pink, moist, without lesion, intact dentition without obvious cavity Lymph: No palpable lymphedema of head or neck Cardiovascular: Regular rate and rhythm, S1 and S2 present, no murmurs auscultated Respiratory: Lung fields clear to auscultation bilaterally, fairly diminished lung sounds of posterior right upper lobe  ASSESSMENT/PLAN:   Upper respiratory tract infection URI symptoms x  3 days.  Appears well-hydrated and NAD.  No red flags or signs of bacterial infection.  POC swab positive for COVID. Paxlovid offered and sent to pharmacy. Discussed stopping certain medications, see AVS for more. Return and quarantine precautions discussed.      Ezequiel Essex, MD [place]

## 2022-10-29 NOTE — Assessment & Plan Note (Addendum)
URI symptoms x 3 days.  Appears well-hydrated and NAD.  No red flags or signs of bacterial infection.  POC swab positive for COVID. Paxlovid offered and sent to pharmacy. Discussed stopping certain medications, see AVS for more. Return and quarantine precautions discussed.

## 2022-10-30 ENCOUNTER — Telehealth: Payer: Self-pay

## 2022-10-30 NOTE — Telephone Encounter (Signed)
Patient calls nurse line reporting symptoms of fatigue and weakness.   She reports she tested positive for covid yesterday, however reports her symptoms started on Monday.   She reports she has not been feeling "all that bad" until this morning. She reports she was "up and moving around" and began to feel lethargic and weak. She denies any dizziness, SOB, chest pains or vision changes. She did not pick Paxlovid and does not plan to.   She reports she has been trying to stay hydrated, however reports she has not been eating much. She reports last meal was yesterday "late afternoon."   Patients advised to continue fluids and rest. Patient advised to try and eat a small meal.   She reports she is not alone and her partner is with her.   Patient to call clinic for an apt if symptoms fail to improve. ED precautions discussed with patient.

## 2022-11-02 ENCOUNTER — Other Ambulatory Visit: Payer: Self-pay

## 2022-11-02 ENCOUNTER — Other Ambulatory Visit: Payer: Self-pay | Admitting: Family Medicine

## 2022-11-03 ENCOUNTER — Other Ambulatory Visit (HOSPITAL_COMMUNITY): Payer: Self-pay

## 2022-11-03 MED ORDER — PRAVASTATIN SODIUM 40 MG PO TABS
40.0000 mg | ORAL_TABLET | Freq: Every day | ORAL | 0 refills | Status: DC
  Filled 2022-11-03: qty 90, 90d supply, fill #0

## 2022-11-04 ENCOUNTER — Other Ambulatory Visit (HOSPITAL_COMMUNITY): Payer: Self-pay

## 2022-11-05 ENCOUNTER — Other Ambulatory Visit (HOSPITAL_COMMUNITY): Payer: Self-pay

## 2022-11-06 ENCOUNTER — Telehealth: Payer: Self-pay | Admitting: Family Medicine

## 2022-11-06 ENCOUNTER — Telehealth: Payer: Self-pay

## 2022-11-06 ENCOUNTER — Other Ambulatory Visit (HOSPITAL_COMMUNITY): Payer: Self-pay

## 2022-11-06 NOTE — Telephone Encounter (Signed)
Last OV 10/14/22 Last refill 10/14/22 #15/0  Controlled substance, forwarding to Dr. Georgina Snell.

## 2022-11-06 NOTE — Telephone Encounter (Signed)
Patient called requesting a refill on  oxyCODONE-acetaminophen (PERCOCET) 10-325 MG tablet   Pharmacy: [place]

## 2022-11-06 NOTE — Telephone Encounter (Signed)
Patient calls nurse line regarding concerns for "pimple like sores" in left nostril. Patient had COVID last week and reports that she was blowing her nose excessively. Today while showering, noticed painful areas inside left nostril.   She denies fever, redness or draining of sores.   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 No personal history of MRSA.   Advised that we would really need to evaluate area in order to determine next steps. Offered appointment this afternoon. Patient unable to schedule due to work schedule.   She scheduled for Monday afternoon with Dr. Nita Sells.   Provided with supportive measures and UC/ED precautions.   Talbot Grumbling, RN

## 2022-11-09 ENCOUNTER — Ambulatory Visit: Payer: 59 | Admitting: Family Medicine

## 2022-11-09 NOTE — Telephone Encounter (Signed)
Patient called back following up on this refill request.

## 2022-11-09 NOTE — Telephone Encounter (Signed)
Last OV 10/14/22 Last refill 10/14/22 #15/0   Controlled substance, forwarding to Dr. Georgina Snell.

## 2022-11-10 ENCOUNTER — Other Ambulatory Visit (HOSPITAL_COMMUNITY): Payer: Self-pay

## 2022-11-10 MED ORDER — OXYCODONE-ACETAMINOPHEN 10-325 MG PO TABS
1.0000 | ORAL_TABLET | Freq: Three times a day (TID) | ORAL | 0 refills | Status: DC | PRN
  Filled 2022-11-10: qty 15, 5d supply, fill #0

## 2022-11-10 NOTE — Addendum Note (Signed)
Addended by: Gregor Hams on: 11/10/2022 07:13 AM   Modules accepted: Orders

## 2022-11-10 NOTE — Telephone Encounter (Signed)
Done

## 2022-12-02 ENCOUNTER — Ambulatory Visit: Payer: 59 | Admitting: Family Medicine

## 2022-12-03 ENCOUNTER — Other Ambulatory Visit (HOSPITAL_COMMUNITY): Payer: Self-pay

## 2022-12-04 ENCOUNTER — Other Ambulatory Visit (HOSPITAL_COMMUNITY): Payer: Self-pay

## 2022-12-07 ENCOUNTER — Encounter: Payer: Self-pay | Admitting: Family Medicine

## 2022-12-07 ENCOUNTER — Other Ambulatory Visit (HOSPITAL_COMMUNITY): Payer: Self-pay

## 2022-12-08 ENCOUNTER — Other Ambulatory Visit (HOSPITAL_COMMUNITY): Payer: Self-pay

## 2022-12-08 MED ORDER — OXYCODONE-ACETAMINOPHEN 10-325 MG PO TABS
1.0000 | ORAL_TABLET | Freq: Three times a day (TID) | ORAL | 0 refills | Status: DC | PRN
  Filled 2022-12-08: qty 15, 5d supply, fill #0

## 2022-12-17 ENCOUNTER — Other Ambulatory Visit (HOSPITAL_COMMUNITY): Payer: Self-pay

## 2022-12-30 ENCOUNTER — Other Ambulatory Visit (HOSPITAL_COMMUNITY): Payer: Self-pay

## 2022-12-31 ENCOUNTER — Other Ambulatory Visit (HOSPITAL_COMMUNITY): Payer: Self-pay

## 2022-12-31 ENCOUNTER — Other Ambulatory Visit: Payer: Self-pay | Admitting: Family Medicine

## 2023-01-01 ENCOUNTER — Other Ambulatory Visit (HOSPITAL_COMMUNITY): Payer: Self-pay

## 2023-01-01 MED ORDER — OXYCODONE-ACETAMINOPHEN 10-325 MG PO TABS
1.0000 | ORAL_TABLET | Freq: Three times a day (TID) | ORAL | 0 refills | Status: DC | PRN
  Filled 2023-01-01: qty 15, 5d supply, fill #0

## 2023-01-04 ENCOUNTER — Other Ambulatory Visit: Payer: 59

## 2023-01-04 ENCOUNTER — Ambulatory Visit (INDEPENDENT_AMBULATORY_CARE_PROVIDER_SITE_OTHER): Payer: 59 | Admitting: Family Medicine

## 2023-01-04 ENCOUNTER — Encounter: Payer: Self-pay | Admitting: Family Medicine

## 2023-01-04 ENCOUNTER — Other Ambulatory Visit: Payer: Self-pay

## 2023-01-04 VITALS — BP 139/80 | HR 70 | Ht 66.0 in | Wt 249.0 lb

## 2023-01-04 DIAGNOSIS — E119 Type 2 diabetes mellitus without complications: Secondary | ICD-10-CM | POA: Diagnosis not present

## 2023-01-04 DIAGNOSIS — I10 Essential (primary) hypertension: Secondary | ICD-10-CM | POA: Diagnosis not present

## 2023-01-04 DIAGNOSIS — Z0000 Encounter for general adult medical examination without abnormal findings: Secondary | ICD-10-CM | POA: Diagnosis not present

## 2023-01-04 LAB — POCT GLYCOSYLATED HEMOGLOBIN (HGB A1C): HbA1c, POC (controlled diabetic range): 5.7 % (ref 0.0–7.0)

## 2023-01-04 NOTE — Progress Notes (Unsigned)
  Date of Visit: 01/04/2023   SUBJECTIVE:   HPI:  Barbara Reese presents today for a well woman exam.   Concerns today: Several, see below Periods: Status post hysterectomy Contraception: Hysterectomy Pelvic symptoms: See below Sexual activity: Female partner STD Screening: Denies concern for STIs Pap smear status: Status post hysterectomy Exercise: Walks regularly Smoking: No Alcohol: Occasional Drugs: No Advance directives: Does not have, would want her son Barbara Reese to be her Runner, broadcasting/film/video.  She would be okay with short-term ventilator support only, would not want long-term artificial prolongation of life.  Okay with CPR at this time. Dentist: Yes goes regularly  Diabetes: Currently taking Ozempic 0.25 mg +2 clicks once a week.  Previously did not tolerate higher dose of 0.5 mg.  Tolerating this medication well.  Hypertension: Currently taking amlodipine 5 mg daily and losartan 25 mg daily.  Given the time constraints of today's annual exam visit, Katie and I used shared decision making to set an agenda for today's visit. We together elected to defer the following problem items to another visit:  -Hot flashes, vaginal irritation/possible yeast infection, frequent urination 4 times at night  I encouraged her to schedule a follow up visit to address those issues.   OBJECTIVE:   BP 139/80   Pulse 70   Ht 5\' 6"  (1.676 m)   Wt 249 lb (112.9 kg)   LMP 02/09/2016 (Exact Date)   SpO2 100%   BMI 40.19 kg/m  Gen: NAD, pleasant, cooperative HEENT: NCAT, PERRL, no palpable thyromegaly or anterior cervical lymphadenopathy Heart: RRR, no murmurs Lungs: CTAB, NWOB Abdomen: soft, nontender to palpation Neuro: grossly nonfocal, speech normal  ASSESSMENT/PLAN:   Health maintenance:  -STD screening: Denies concern, declines testing today -pap smear: Status post hysterectomy -mammogram: Up-to-date -lipid screening: On statin, update lipids tomorrow -colon  cancer screening: Up-to-date, last colonoscopy February 2022, no polyps seen or removed, due for repeat 2032 -lung cancer screening: Not indicated -DEXA: Not indicated -immunizations:  Tdap: Up-to-date Shingrix: Contemplating COVID: Declines booster Pneumovax: Up-to-date -advance directives: Reviewed at length with patient today, gave packet containing healthcare power of attorney and living will documents. -handout given on health maintenance topics  Type 2 diabetes mellitus (HCC) A1c today controlled at 5.7.  Continue current regimen.  Hypertension Well-controlled, continue current regimen.  FOLLOW UP: Follow up at next available appointment for issues identified above  [place] J. Pollie Meyer, MD [facility]

## 2023-01-04 NOTE — Patient Instructions (Addendum)
It was great to see you again today.  Checking cholesterol and kidney function today.  Also checking urine protein level.  Fill out the advance directive packet.  Follow-up in June to discuss the issues you identified today.  Be well, Dr. Pollie Meyer  Health Maintenance, Female Adopting a healthy lifestyle and getting preventive care are important in promoting health and wellness. Ask your health care provider about: The right schedule for you to have regular tests and exams. Things you can do on your own to prevent diseases and keep yourself healthy. What should I know about diet, weight, and exercise? Eat a healthy diet  Eat a diet that includes plenty of vegetables, fruits, low-fat dairy products, and lean protein. Do not eat a lot of foods that are high in solid fats, added sugars, or sodium. Maintain a healthy weight Body mass index (BMI) is used to identify weight problems. It estimates body fat based on height and weight. Your health care provider can help determine your BMI and help you achieve or maintain a healthy weight. Get regular exercise Get regular exercise. This is one of the most important things you can do for your health. Most adults should: Exercise for at least 150 minutes each week. The exercise should increase your heart rate and make you sweat (moderate-intensity exercise). Do strengthening exercises at least twice a week. This is in addition to the moderate-intensity exercise. Spend less time sitting. Even light physical activity can be beneficial. Watch cholesterol and blood lipids Have your blood tested for lipids and cholesterol at 57 years of age, then have this test every 5 years. Have your cholesterol levels checked more often if: Your lipid or cholesterol levels are high. You are older than 57 years of age. You are at high risk for heart disease. What should I know about cancer screening? Depending on your health history and family history, you may need  to have cancer screening at various ages. This may include screening for: Breast cancer. Cervical cancer. Colorectal cancer. Skin cancer. Lung cancer. What should I know about heart disease, diabetes, and high blood pressure? Blood pressure and heart disease High blood pressure causes heart disease and increases the risk of stroke. This is more likely to develop in people who have high blood pressure readings or are overweight. Have your blood pressure checked: Every 3-5 years if you are 40-56 years of age. Every year if you are 45 years old or older. Diabetes Have regular diabetes screenings. This checks your fasting blood sugar level. Have the screening done: Once every three years after age 11 if you are at a normal weight and have a low risk for diabetes. More often and at a younger age if you are overweight or have a high risk for diabetes. What should I know about preventing infection? Hepatitis B If you have a higher risk for hepatitis B, you should be screened for this virus. Talk with your health care provider to find out if you are at risk for hepatitis B infection. Hepatitis C Testing is recommended for: Everyone born from [birth year] through 1965. Anyone with known risk factors for hepatitis C. Sexually transmitted infections (STIs) Get screened for STIs, including gonorrhea and chlamydia, if: You are sexually active and are younger than 57 years of age. You are older than 57 years of age and your health care provider tells you that you are at risk for this type of infection. Your sexual activity has changed since you were last screened, and you  are at increased risk for chlamydia or gonorrhea. Ask your health care provider if you are at risk. Ask your health care provider about whether you are at high risk for HIV. Your health care provider may recommend a prescription medicine to help prevent HIV infection. If you choose to take medicine to prevent HIV, you should first get tested  for HIV. You should then be tested every 3 months for as long as you are taking the medicine. Pregnancy If you are about to stop having your period (premenopausal) and you may become pregnant, seek counseling before you get pregnant. Take 400 to 800 micrograms (mcg) of folic acid every day if you become pregnant. Ask for birth control (contraception) if you want to prevent pregnancy. Osteoporosis and menopause Osteoporosis is a disease in which the bones lose minerals and strength with aging. This can result in bone fractures. If you are 64 years old or older, or if you are at risk for osteoporosis and fractures, ask your health care provider if you should: Be screened for bone loss. Take a calcium or vitamin D supplement to lower your risk of fractures. Be given hormone replacement therapy (HRT) to treat symptoms of menopause. Follow these instructions at home: Alcohol use Do not drink alcohol if: Your health care provider tells you not to drink. You are pregnant, may be pregnant, or are planning to become pregnant. If you drink alcohol: Limit how much you have to: 0-1 drink a day. Know how much alcohol is in your drink. In the [place] one drink equals one 12 oz bottle of beer (355 mL), one 5 oz glass of wine (148 mL), or one 1 oz glass of hard liquor (44 mL). Lifestyle Do not use any products that contain nicotine or tobacco. These products include cigarettes, chewing tobacco, and vaping devices, such as e-cigarettes. If you need help quitting, ask your health care provider. Do not use street drugs. Do not share needles. Ask your health care provider for help if you need support or information about quitting drugs. General instructions Schedule regular health, dental, and eye exams. Stay current with your vaccines. Tell your health care provider if: You often feel depressed. You have ever been abused or do not feel safe at home. Summary Adopting a healthy lifestyle and getting  preventive care are important in promoting health and wellness. Follow your health care provider's instructions about healthy diet, exercising, and getting tested or screened for diseases. Follow your health care provider's instructions on monitoring your cholesterol and blood pressure. This information is not intended to replace advice given to you by your health care provider. Make sure you discuss any questions you have with your health care provider. Document Revised: 12/30/2020 Document Reviewed: 12/30/2020 Elsevier Patient Education  2023 [employer].

## 2023-01-05 LAB — LIPID PANEL
Chol/HDL Ratio: 2.3 ratio (ref 0.0–4.4)
Cholesterol, Total: 180 mg/dL (ref 100–199)
HDL: 77 mg/dL (ref >39)
LDL Chol Calc (NIH): 91 mg/dL (ref 0–99)
Triglycerides: 65 mg/dL (ref 0–149)
VLDL Cholesterol Cal: 12 mg/dL (ref 5–40)

## 2023-01-05 LAB — BASIC METABOLIC PANEL
BUN/Creatinine Ratio: 11 (ref 9–23)
BUN: 9 mg/dL (ref 6–24)
CO2: 24 mmol/L (ref 20–29)
Calcium: 9.6 mg/dL (ref 8.7–10.2)
Chloride: 106 mmol/L (ref 96–106)
Creatinine, Ser: 0.8 mg/dL (ref 0.57–1.00)
Glucose: 98 mg/dL (ref 70–99)
Potassium: 3.9 mmol/L (ref 3.5–5.2)
Sodium: 145 mmol/L — ABNORMAL HIGH (ref 134–144)
eGFR: 86 mL/min/{1.73_m2} (ref >59)

## 2023-01-05 LAB — MICROALBUMIN / CREATININE URINE RATIO
Creatinine, Urine: 119.8 mg/dL
Microalb/Creat Ratio: 3 mg/g creat (ref 0–29)
Microalbumin, Urine: 3.3 ug/mL

## 2023-01-06 ENCOUNTER — Other Ambulatory Visit: Payer: Self-pay

## 2023-01-06 NOTE — Assessment & Plan Note (Signed)
A1c today controlled at 5.7.  Continue current regimen.

## 2023-01-06 NOTE — Assessment & Plan Note (Signed)
Well-controlled, continue current regimen 

## 2023-01-08 ENCOUNTER — Encounter: Payer: Self-pay | Admitting: Family Medicine

## 2023-01-27 ENCOUNTER — Other Ambulatory Visit: Payer: Self-pay | Admitting: Family Medicine

## 2023-01-27 ENCOUNTER — Other Ambulatory Visit (HOSPITAL_COMMUNITY): Payer: Self-pay

## 2023-01-27 NOTE — Telephone Encounter (Signed)
Last OV 10/14/22 Next OV 12/02/22 (canceled, not r/s) Last refill 01/01/23 Qty #15/0  Controlled substance, forwarding to Dr. Denyse Amass.

## 2023-01-28 ENCOUNTER — Other Ambulatory Visit (HOSPITAL_COMMUNITY): Payer: Self-pay

## 2023-01-28 MED ORDER — OXYCODONE-ACETAMINOPHEN 10-325 MG PO TABS
1.0000 | ORAL_TABLET | Freq: Three times a day (TID) | ORAL | 0 refills | Status: DC | PRN
  Filled 2023-01-28: qty 15, 5d supply, fill #0

## 2023-01-29 ENCOUNTER — Other Ambulatory Visit (HOSPITAL_COMMUNITY): Payer: Self-pay

## 2023-01-29 MED ORDER — PRAVASTATIN SODIUM 40 MG PO TABS
40.0000 mg | ORAL_TABLET | Freq: Every day | ORAL | 3 refills | Status: DC
  Filled 2023-01-29: qty 90, 90d supply, fill #0
  Filled 2023-05-04: qty 90, 90d supply, fill #1
  Filled 2023-08-19: qty 90, 90d supply, fill #2
  Filled 2023-11-12: qty 90, 90d supply, fill #3

## 2023-01-29 MED ORDER — AMLODIPINE BESYLATE 5 MG PO TABS
5.0000 mg | ORAL_TABLET | Freq: Every day | ORAL | 3 refills | Status: DC
  Filled 2023-01-29: qty 90, 90d supply, fill #0
  Filled 2023-05-04: qty 90, 90d supply, fill #1
  Filled 2023-08-19: qty 90, 90d supply, fill #2
  Filled 2023-11-12: qty 90, 90d supply, fill #3

## 2023-01-29 MED ORDER — LOSARTAN POTASSIUM 25 MG PO TABS
25.0000 mg | ORAL_TABLET | Freq: Every day | ORAL | 3 refills | Status: DC
  Filled 2023-01-29: qty 90, 90d supply, fill #0
  Filled 2023-05-04: qty 90, 90d supply, fill #1
  Filled 2023-08-19: qty 90, 90d supply, fill #2
  Filled 2023-11-12: qty 90, 90d supply, fill #3

## 2023-02-02 ENCOUNTER — Other Ambulatory Visit (HOSPITAL_COMMUNITY): Payer: Self-pay

## 2023-02-10 ENCOUNTER — Ambulatory Visit: Payer: 59 | Admitting: Family Medicine

## 2023-02-26 ENCOUNTER — Other Ambulatory Visit: Payer: Self-pay | Admitting: Family Medicine

## 2023-03-01 ENCOUNTER — Other Ambulatory Visit: Payer: Self-pay

## 2023-03-01 ENCOUNTER — Other Ambulatory Visit (HOSPITAL_COMMUNITY): Payer: Self-pay

## 2023-03-01 NOTE — Telephone Encounter (Signed)
Last OV 10/14/22 Next OV not scheduled  Last refill 01/28/23 Qty #15/0

## 2023-03-02 ENCOUNTER — Other Ambulatory Visit (HOSPITAL_COMMUNITY): Payer: Self-pay

## 2023-03-02 MED ORDER — OXYCODONE-ACETAMINOPHEN 10-325 MG PO TABS
1.0000 | ORAL_TABLET | Freq: Three times a day (TID) | ORAL | 0 refills | Status: DC | PRN
  Filled 2023-03-02: qty 15, 5d supply, fill #0

## 2023-03-10 ENCOUNTER — Ambulatory Visit (INDEPENDENT_AMBULATORY_CARE_PROVIDER_SITE_OTHER): Payer: 59 | Admitting: Family Medicine

## 2023-03-10 ENCOUNTER — Other Ambulatory Visit (HOSPITAL_COMMUNITY): Payer: Self-pay

## 2023-03-10 VITALS — BP 164/96 | HR 86 | Ht 66.0 in | Wt 254.0 lb

## 2023-03-10 DIAGNOSIS — G8929 Other chronic pain: Secondary | ICD-10-CM | POA: Diagnosis not present

## 2023-03-10 DIAGNOSIS — M25512 Pain in left shoulder: Secondary | ICD-10-CM

## 2023-03-10 MED ORDER — LORAZEPAM 0.5 MG PO TABS
ORAL_TABLET | ORAL | 0 refills | Status: DC
  Filled 2023-03-10: qty 4, 2d supply, fill #0

## 2023-03-10 NOTE — Patient Instructions (Addendum)
Thank you for coming in today.   You should hear about scheduling the MRI within 1 week. If you are not contact, please let me know.    Anticipate recheck with me or surgery after the MRI.

## 2023-03-10 NOTE — Progress Notes (Signed)
Rubin Payor, PhD, LAT, ATC acting as a scribe for Barbara Graham, MD.  Barbara Reese is a 57 y.o. female who presents to [facility] today for cont'd L shoulder pain. Pt was last seen by Dr. Denyse Amass on 10/14/22  and was given a L subacromial steroid injection, limited oxycodone was refilled, and she was referred to PT, she never scheduled any visit. Oxycodone has been refilled several times since her last visit.   Today, pt reports L shoulder is the same. She notes that after her subacromial steroid injection she had 2 days of excruciate pain. Pain is now radiating into the L trapz/L-side of her neck and distally into the upper arm. No numbness/tingling, but describes pain as "burning." She reports she was in too much pain to go to PT and is against this treatment option.  Treatments tried: tiger balm, heat, ice, oxycodone,   Dx imaging: 10/14/22 L shoulder XR  Pertinent review of systems: No fevers or chills  Relevant historical information: Hypertension and diabetes   Exam:  BP (!) 164/96   Pulse 86   Ht 5\' 6"  (1.676 m)   Wt 254 lb (115.2 kg)   LMP 02/09/2016 (Exact Date)   SpO2 98%   BMI 41.00 kg/m  General: Well Developed, well nourished, and in no acute distress.   MSK: Shoulder decreased range of motion in abduction. Strength intact within limits of motion.. Normal appearing. Nontender    Lab and Radiology Results  EXAM: LEFT SHOULDER - 2+ VIEW   COMPARISON:  None Available.   FINDINGS: There is no evidence of fracture or dislocation. Mild degenerative changes left shoulder. 1.1 cm calcific density along the rotator cuff musculature suggestive of calcific tendinosis. Soft tissues are unremarkable.   IMPRESSION: Calcific tendinosis.     Electronically Signed   By: Tish Frederickson M.D.   On: 10/16/2022 03:57   I, Barbara Reese, personally (independently) visualized and performed the interpretation of the images attached in  this note.     Assessment and Plan: 57 y.o. female with left shoulder pain.  This is a chronic ongoing issue.  She did not respond well to subacromial bursa injection in February.  She is done home exercise program since with no benefit.  She experiences significant continued shoulder pain.  Concerning for rotator cuff tendinitis or perhaps adhesive capsulitis.  Plan for MRI with pain.  We talked about oxycodone use.  Will have to decrease the total dose of oxycodone in the near future should she continue to require it.  The goal is to understand the fundamental problem with her shoulder and get her better.   PDMP not reviewed this encounter. Orders Placed This Encounter  Procedures   MR SHOULDER LEFT WO CONTRAST    Standing Status:   Future    Standing Expiration Date:   03/09/2024    Order Specific Question:   What is the patient's sedation requirement?    Answer:   No Sedation    Order Specific Question:   Does the patient have a pacemaker or implanted devices?    Answer:   No    Order Specific Question:   Preferred imaging location?    Answer:   [facility] (table limit-550lbs)   Meds ordered this encounter  Medications   LORazepam (ATIVAN) 0.5 MG tablet    Sig: Take 1-2 tablets by mouth 30 - 60 min prior to MRI. Do not drive with this medicine.  Dispense:  4 tablet    Refill:  0     Discussed warning signs or symptoms. Please see discharge instructions. Patient expresses understanding.   The above documentation has been reviewed and is accurate and complete Barbara Reese, M.D.

## 2023-03-16 ENCOUNTER — Ambulatory Visit: Payer: 59 | Admitting: Family Medicine

## 2023-03-17 ENCOUNTER — Telehealth: Payer: Self-pay | Admitting: Family Medicine

## 2023-03-17 ENCOUNTER — Emergency Department (HOSPITAL_BASED_OUTPATIENT_CLINIC_OR_DEPARTMENT_OTHER): Payer: 59 | Admitting: Radiology

## 2023-03-17 ENCOUNTER — Other Ambulatory Visit: Payer: Self-pay

## 2023-03-17 ENCOUNTER — Ambulatory Visit (INDEPENDENT_AMBULATORY_CARE_PROVIDER_SITE_OTHER): Payer: 59 | Admitting: Student

## 2023-03-17 ENCOUNTER — Encounter: Payer: Self-pay | Admitting: Student

## 2023-03-17 ENCOUNTER — Emergency Department (HOSPITAL_BASED_OUTPATIENT_CLINIC_OR_DEPARTMENT_OTHER)
Admission: EM | Admit: 2023-03-17 | Discharge: 2023-03-17 | Disposition: A | Discharge status: Home / Self Care | Payer: 59 | Attending: Emergency Medicine | Admitting: Emergency Medicine

## 2023-03-17 ENCOUNTER — Other Ambulatory Visit (HOSPITAL_COMMUNITY): Payer: Self-pay

## 2023-03-17 VITALS — BP 126/69 | HR 65 | Ht 66.0 in | Wt 254.4 lb

## 2023-03-17 DIAGNOSIS — B3731 Acute candidiasis of vulva and vagina: Secondary | ICD-10-CM | POA: Diagnosis not present

## 2023-03-17 DIAGNOSIS — M25512 Pain in left shoulder: Secondary | ICD-10-CM | POA: Insufficient documentation

## 2023-03-17 DIAGNOSIS — E119 Type 2 diabetes mellitus without complications: Secondary | ICD-10-CM | POA: Diagnosis not present

## 2023-03-17 DIAGNOSIS — G8929 Other chronic pain: Secondary | ICD-10-CM | POA: Diagnosis not present

## 2023-03-17 DIAGNOSIS — N898 Other specified noninflammatory disorders of vagina: Secondary | ICD-10-CM | POA: Diagnosis not present

## 2023-03-17 DIAGNOSIS — M19012 Primary osteoarthritis, left shoulder: Secondary | ICD-10-CM | POA: Diagnosis not present

## 2023-03-17 LAB — POCT WET PREP (WET MOUNT)
Clue Cells Wet Prep Whiff POC: NEGATIVE
Trichomonas Wet Prep HPF POC: ABSENT
WBC, Wet Prep HPF POC: >20

## 2023-03-17 MED ORDER — OXYCODONE-ACETAMINOPHEN 5-325 MG PO TABS
2.0000 | ORAL_TABLET | Freq: Four times a day (QID) | ORAL | 0 refills | Status: AC | PRN
Start: 2023-03-17 — End: 2023-03-24
  Filled 2023-03-17: qty 20, 3d supply, fill #0

## 2023-03-17 MED ORDER — OXYCODONE-ACETAMINOPHEN 5-325 MG PO TABS
2.0000 | ORAL_TABLET | Freq: Once | ORAL | Status: AC
  Administered 2023-03-17: 2 via ORAL
  Filled 2023-03-17: qty 2

## 2023-03-17 MED ORDER — FLUCONAZOLE 150 MG PO TABS
150.0000 mg | ORAL_TABLET | Freq: Every day | ORAL | 0 refills | Status: DC
Start: 2023-03-17 — End: 2023-07-13
  Filled 2023-03-17: qty 2, 2d supply, fill #0

## 2023-03-17 MED ORDER — KETOROLAC TROMETHAMINE 30 MG/ML IJ SOLN
30.0000 mg | Freq: Once | INTRAMUSCULAR | Status: AC
  Administered 2023-03-17: 30 mg via INTRAMUSCULAR
  Filled 2023-03-17: qty 1

## 2023-03-17 MED ORDER — TRAMADOL HCL 50 MG PO TABS
50.0000 mg | ORAL_TABLET | Freq: Two times a day (BID) | ORAL | 0 refills | Status: AC | PRN
  Filled 2023-03-17: qty 21, 10d supply, fill #0
  Filled 2023-03-17: qty 9, 5d supply, fill #0

## 2023-03-17 NOTE — Telephone Encounter (Signed)
Pt struggling with left shoulder pain, has MRI 7/31.  She wonders if a brace of some sort might help.

## 2023-03-17 NOTE — ED Provider Notes (Signed)
[place] EMERGENCY DEPARTMENT AT [facility] Provider Note   CSN: [id number] Arrival date & time: 03/17/23  1415     History Chief Complaint  Patient presents with   Shoulder Pain    Barbara Reese is a 57 y.o. female.  Patient presented to the emergency department complaints of left shoulder pain.  Reports has been ongoing for several months and recently had a shoulder injection performed in February 2024.  States that since this injection, shoulder he is "not felt right".  Denies any recent injuries such as any falls, motor vehicle accidents, heavy lifting reinjured the area.  Has not tried to manage pain at home with over-the-counter medications but also has been seen by sports medicine and family medicine provider currently managing pain with Percocet.  Tramadol is prescribed today for pain.  Patient has not been evaluated by orthopedic surgery.   Shoulder Pain      Home Medications Prior to Admission medications   Medication Sig Start Date End Date Taking? Authorizing Provider  oxyCODONE-acetaminophen (PERCOCET/ROXICET) 5-325 MG tablet Take 2 tablets by mouth every 6 (six) hours as needed for up to 7 days for severe pain. 03/17/23 03/24/23 Yes Smitty Knudsen, PA-C  amLODipine (NORVASC) 5 MG tablet Take 1 tablet (5 mg total) by mouth at bedtime. 01/29/23   Latrelle Dodrill, MD  cetirizine (ZYRTEC) 10 MG tablet Take 10 mg by mouth daily as needed for allergies.    [provider]  clobetasol cream (TEMOVATE) 0.05 % Apply topically to affected area daily as needed. 09/28/22   Latrelle Dodrill, MD  colchicine 0.6 MG tablet Take 1 tablet (0.6 mg total) by mouth daily as needed (gout or psuedogout pain). 11/04/21   Rodolph Bong, MD  fluconazole (DIFLUCAN) 150 MG tablet Take 1 tablet (150 mg total) by mouth today and 1 tablet in 72 hours. 03/17/23   Dameron, Nolberto Hanlon, DO  fluticasone (FLONASE) 50 MCG/ACT nasal spray Place 1 spray into both nostrils daily. 04/02/22    Latrelle Dodrill, MD  LORazepam (ATIVAN) 0.5 MG tablet Take 1-2 tablets by mouth 30 - 60 min prior to MRI. Do not drive with this medicine. 03/10/23   Rodolph Bong, MD  losartan (COZAAR) 25 MG tablet Take 1 tablet (25 mg total) by mouth daily. 01/29/23   Latrelle Dodrill, MD  pantoprazole (PROTONIX) 40 MG tablet Take 1 tablet (40 mg total) by mouth daily. 02/16/22   Latrelle Dodrill, MD  pravastatin (PRAVACHOL) 40 MG tablet Take 1 tablet (40 mg total) by mouth at bedtime. 01/29/23   Latrelle Dodrill, MD  Semaglutide,0.25 or 0.5MG /DOS, (OZEMPIC, 0.25 OR 0.5 MG/DOSE,) 2 MG/3ML SOPN Inject 0.5 mg into the skin once a week. 09/24/22   Latrelle Dodrill, MD  traMADol (ULTRAM) 50 MG tablet Take 1 tablet (50 mg total) by mouth every 12 (twelve) hours as needed for up to 15 days for moderate pain or severe pain. 03/17/23 04/01/23  Dameron, Nolberto Hanlon, DO      Allergies    Atorvastatin, Egg-derived products, Ezetimibe, Rosuvastatin, and Gabapentin    Review of Systems   Review of Systems  Musculoskeletal:        Shoulder pain  All other systems reviewed and are negative.   Physical Exam Updated Vital Signs BP (!) 148/85   Pulse 70   Temp (!) 97.5 F (36.4 C) (Oral)   Resp 19   Ht 5\' 6"  (1.676 m)   Wt 115.2 kg  LMP 02/09/2016 (Exact Date)   SpO2 100%   BMI 41.00 kg/m  Physical Exam Vitals and nursing note reviewed.  Constitutional:      General: She is not in acute distress.    Appearance: She is well-developed.  HENT:     Head: Normocephalic and atraumatic.  Eyes:     Conjunctiva/sclera: Conjunctivae normal.  Cardiovascular:     Rate and Rhythm: Normal rate and regular rhythm.     Heart sounds: No murmur heard. Pulmonary:     Effort: Pulmonary effort is normal. No respiratory distress.     Breath sounds: Normal breath sounds.  Abdominal:     Palpations: Abdomen is soft.     Tenderness: There is no abdominal tenderness.  Musculoskeletal:        General: Tenderness  present. No swelling, deformity or signs of injury.     Cervical back: Neck supple.     Comments: Limited range of motion of the left shoulder due to pain.  Patient had positive findings on Neer's, Hawkins, lag sign, and lift off. Concerning for rotator cuff injury.  Skin:    General: Skin is warm and dry.     Capillary Refill: Capillary refill takes less than 2 seconds.  Neurological:     Mental Status: She is alert.  Psychiatric:        Mood and Affect: Mood normal.     ED Results / Procedures / Treatments   Labs (all labs ordered are listed, but only abnormal results are displayed) Labs Reviewed - No data to display  EKG None  Radiology DG Shoulder Left  Result Date: 03/17/2023 CLINICAL DATA:  pain EXAM: LEFT SHOULDER - 2+ VIEW COMPARISON:  10/14/2022. FINDINGS: No acute fracture or dislocation. No aggressive osseous lesion. There are faint calcifications adjacent the greater tuberosity, nonspecific but commonly seen with calcific tendinopathy. Glenohumeral and acromioclavicular joints are normal in alignment. Moderate osteoarthritis of the acromioclavicular joint. Mild osteoarthritis of the glenohumeral joint. No soft tissue swelling. No radiopaque foreign bodies. IMPRESSION: 1. Osteoarthritis. 2. Calcific tendinopathy. Electronically Signed   By: Jules Schick M.D.   On: 03/17/2023 16:38    Procedures Procedures   Medications Ordered in ED Medications  ketorolac (TORADOL) 30 MG/ML injection 30 mg (has no administration in time range)  oxyCODONE-acetaminophen (PERCOCET/ROXICET) 5-325 MG per tablet 2 tablet (has no administration in time range)    ED Course/ Medical Decision Making/ A&P                           Medical Decision Making Amount and/or Complexity of Data Reviewed Radiology: ordered.  Risk Prescription drug management.   This patient presents to the ED for concern of shoulder pain. Differential diagnosis includes rotator cuff injury, AC joint injury,  shoulder dislocation, septic joint   Imaging Studies ordered:  I ordered imaging studies including left shoulder x-ray I independently visualized and interpreted imaging which showed osteoarthritis and calcific tendinopathy I agree with the radiologist interpretation   Medicines ordered and prescription drug management:  I ordered medication including Toradol, Percocet for pain Reevaluation of the patient after these medicines showed that the patient improved I have reviewed the patients home medicines and have made adjustments as needed   Problem List / ED Course:  Patient presents to the emergency department complaints of left shoulder pain.  Reports the pains been ongoing for an extended period of time as she has been seen by sports medicine with a  shoulder injection performed in February of this year.  Patient has not had any outpatient orthopedic evaluation for the shoulder pain.  Denies any recent injury to the area such as any fall, motor vehicle accident, or other injury.  Reports that she has an outpatient MRI scheduled for next week.  Currently denies any chest pain, shortness of breath, radiating pain from the chest into the shoulder, or upper extremity weakness or numbness. On examination, patient has multiple positive findings suspecting rotator cuff injury of the left shoulder.  This patient currently has MRI scheduled in a week, advised patient that MRI in the emergency department setting is not advised at this would not change orthopedic management of her condition.  Will refer patient to outpatient orthopedics for further management of her shoulder pain.  Referral sent to Dr. Willette Cluster with [facility].  A renewal of patient's Percocet was also sent to patient's pharmacy.  Dose of Toradol and Percocet given here in the emergency department.  Advised patient to return if she has any acute worsening of symptoms.  Strict return precautions discussed.  Patient is  agreeable to treatment plan verbalized understanding return precautions.  All questions answered prior to patient discharge.  Final Clinical Impression(s) / ED Diagnoses Final diagnoses:  Chronic left shoulder pain    Rx / DC Orders ED Discharge Orders          Ordered    Ambulatory referral to Orthopedic Surgery        03/17/23 1652    oxyCODONE-acetaminophen (PERCOCET/ROXICET) 5-325 MG tablet  Every 6 hours PRN        03/17/23 1652              Smitty Knudsen, PA-C 03/17/23 1702    Sloan Leiter, DO 03/19/23 1514

## 2023-03-17 NOTE — Patient Instructions (Addendum)
It was great seeing you today.  As we discussed, -Your lab test shows you have a yeast infection.  Will treat you with Diflucan.  Take 1 tablet by mouth today.  Take the second tablet by mouth 72 hours later. -Please schedule an appointment to follow-up with [place] in the next month so we can see how you are doing with your Ozempic.  As we discussed, take 1 capful of MiraLAX daily.  Drink plenty of water and also drink electrolytes such as Gatorlyte 0, liquid IV sugar-free.   If you have any questions or concerns, please feel free to call the clinic.   Have a wonderful day,  Dr. Darral Dash [facility] [phone]

## 2023-03-17 NOTE — ED Triage Notes (Signed)
Reports left shoulder pain x1 month. [place] scheduled for next week with sports medicine. Saw PCP this AM and prescribed tramadol. Has not taken yet. Denies CP, no new developments today as far as quality or severity of pain.

## 2023-03-17 NOTE — Progress Notes (Signed)
    SUBJECTIVE:   CHIEF COMPLAINT / HPI:   Barbara Reese is a 57 year old female here for follow-up to discuss shoulder pain, change in vaginal discharge and side effects from Ozempic.  Left shoulder pain She saw sports medicine on 7/17 and they plan on getting an MRI for her shoulder.  There is concern for possible rotator cuff tendinitis versus arthropathy. She wants to know if she can take tramadol for her shoulder pain in addition to ibuprofen.  Requested a prescription today.  She says that she would be cautious with the tramadol because it does make her sleepy, and would likely take it at night or on days that she is not working.  Vaginal discharge She is having discharge and feels like she is having recurrent yeast infections. She uses Cetaphil soap, unscented. Unsure if it is okay to use. No pelvic pain.  No dysuria, hematuria, fevers or chills.  She is reporting a white/thick discharge.  No change in odor.  Also has itchiness.  She noticed the most recent episode that started last week. She has a female partner.  Denies douching or tampon use.  Medication side effects She has been skipping her doses. She feels terrible, fatigued, constipated and nauseous when she takes it. She likes that it helped lower her A1c. She says she was taking MiraLax a couple times a week but not daily.Marland Kitchen  PERTINENT  PMH / PSH: T2DM, hypertension, OSA, hyperlipidemia, obesity  OBJECTIVE:   BP 126/69   Pulse 65   Ht 5\' 6"  (1.676 m)   Wt 254 lb 6 oz (115.4 kg)   LMP 02/09/2016 (Exact Date)   SpO2 100%   BMI 41.06 kg/m   General: Alert and cooperative and appears to be in no acute distress Respiratory: Normal work of breathing on room air GU: Tashira, CMA present as chaperone.  External vulva and vagina nonerythematous, without any obvious lesions or rash. Yellowish tinted discharge appreciated.  Normal ruggae of vaginal walls.  Cervix is non erythematous and non-friable.  There is no cervical  motion tenderness, masses or gross abnormalities appreciated during bimanual exam.    ASSESSMENT/PLAN:   Vaginal candidiasis On exam, thick white discharge.  Wet prep shows many yeast. Plan to treat with Diflucan x 2, take tablet once today, then the second tablet 72 hours later She is frequent recurrence, but has not been taking any recent antibiotics, and she is a well-controlled diabetic. Can consider Candida species panel She may benefit from prophylactic treatment in the future if she has greater than 4 episodes in 1 year  Type 2 diabetes mellitus (HCC) We had a long discussion regarding her Ozempic. Discussed discontinuation of medication versus starting a good bowel regimen with MiraLAX once daily and plenty of oral hydration with electrolytes to lower chance of adverse effects after injections. Also discussed that Trulicity is another option that is a weaker GLP-1 and may have lower risk of side effects Will follow-up with her PCP for further discussion   Darral Dash, DO [facility]

## 2023-03-17 NOTE — Telephone Encounter (Signed)
Called and provided the advise to pt. She inquired about a shoulder sleeve and I advised that our best recommendation was to limit use/movement w/ a sling. She would be welcome to try some type of compressive sleeve, but that we don't keep anything like that in stock. She verbalized understanding.

## 2023-03-17 NOTE — Discharge Instructions (Signed)
You are seen in the emergency department for shoulder pain.  Your x-ray imaging was negative for any acute findings but there is evidence of possible tendinopathy in the shoulder.  Based on her physical exam, I believe that he would benefit from MRI imaging of the shoulder as you currently have scheduled in the outpatient setting.  I sent a renewal of your Percocet to be used at home for more severe pain.  If you can manage the more mild pain with Tylenol and ibuprofen, I would recommend doing this.  You are also placed into a shoulder immobilizer for added comfort given the significant pain that you have been experiencing.  Referral to orthopedic surgery was made to Dr. Eulah Pont with [facility].  Please reach out to their office for scheduling if you do not hear back from them in the next 24 to 48 hours.

## 2023-03-17 NOTE — Telephone Encounter (Signed)
A basic over the counter shoulder sling could help.  Generally they are much cheaper to get yourself at a pharmacy or [employer] than from a doctor's office.

## 2023-03-18 DIAGNOSIS — B3731 Acute candidiasis of vulva and vagina: Secondary | ICD-10-CM | POA: Insufficient documentation

## 2023-03-18 NOTE — Assessment & Plan Note (Addendum)
We had a long discussion regarding her Ozempic. Discussed discontinuation of medication versus starting a good bowel regimen with MiraLAX once daily and plenty of oral hydration with electrolytes to lower chance of adverse effects after injections. Also discussed that Trulicity is another option that is a weaker GLP-1 and may have lower risk of side effects Will follow-up with her PCP for further discussion

## 2023-03-18 NOTE — Assessment & Plan Note (Signed)
On exam, thick white discharge.  Wet prep shows many yeast. Plan to treat with Diflucan x 2, take tablet once today, then the second tablet 72 hours later She is frequent recurrence, but has not been taking any recent antibiotics, and she is a well-controlled diabetic. Can consider Candida species panel She may benefit from prophylactic treatment in the future if she has greater than 4 episodes in 1 year

## 2023-03-19 ENCOUNTER — Encounter: Payer: Self-pay | Admitting: Family Medicine

## 2023-03-23 ENCOUNTER — Other Ambulatory Visit (HOSPITAL_COMMUNITY): Payer: Self-pay

## 2023-03-24 ENCOUNTER — Ambulatory Visit
Admission: RE | Admit: 2023-03-24 | Discharge: 2023-03-24 | Disposition: A | Discharge status: Home / Self Care | Payer: 59 | Source: Ambulatory Visit | Attending: Family Medicine | Admitting: Family Medicine

## 2023-03-24 DIAGNOSIS — M67814 Other specified disorders of tendon, left shoulder: Secondary | ICD-10-CM | POA: Diagnosis not present

## 2023-03-24 DIAGNOSIS — M7552 Bursitis of left shoulder: Secondary | ICD-10-CM | POA: Diagnosis not present

## 2023-03-24 DIAGNOSIS — G8929 Other chronic pain: Secondary | ICD-10-CM

## 2023-03-24 DIAGNOSIS — M75122 Complete rotator cuff tear or rupture of left shoulder, not specified as traumatic: Secondary | ICD-10-CM | POA: Diagnosis not present

## 2023-04-01 ENCOUNTER — Telehealth: Payer: Self-pay | Admitting: Family Medicine

## 2023-04-01 DIAGNOSIS — G8929 Other chronic pain: Secondary | ICD-10-CM

## 2023-04-01 NOTE — Telephone Encounter (Signed)
Referred to orthopedic surgery

## 2023-04-01 NOTE — Progress Notes (Signed)
Shoulder MRI shows a partial rotator cuff tear and tendinitis.  I going to refer you to a surgeon to discuss surgical options.  I think that is probably your best bet at this point.

## 2023-04-02 ENCOUNTER — Emergency Department (HOSPITAL_COMMUNITY): Payer: 59

## 2023-04-02 ENCOUNTER — Other Ambulatory Visit: Payer: Self-pay

## 2023-04-02 ENCOUNTER — Encounter (HOSPITAL_COMMUNITY): Payer: Self-pay

## 2023-04-02 ENCOUNTER — Emergency Department (HOSPITAL_COMMUNITY)
Admission: EM | Admit: 2023-04-02 | Discharge: 2023-04-02 | Disposition: A | Discharge status: Home / Self Care | Payer: 59 | Attending: Emergency Medicine | Admitting: Emergency Medicine

## 2023-04-02 ENCOUNTER — Other Ambulatory Visit (HOSPITAL_COMMUNITY): Payer: Self-pay

## 2023-04-02 DIAGNOSIS — M7731 Calcaneal spur, right foot: Secondary | ICD-10-CM | POA: Diagnosis not present

## 2023-04-02 DIAGNOSIS — Z794 Long term (current) use of insulin: Secondary | ICD-10-CM | POA: Insufficient documentation

## 2023-04-02 DIAGNOSIS — X501XXA Overexertion from prolonged static or awkward postures, initial encounter: Secondary | ICD-10-CM | POA: Insufficient documentation

## 2023-04-02 DIAGNOSIS — Z79899 Other long term (current) drug therapy: Secondary | ICD-10-CM | POA: Diagnosis not present

## 2023-04-02 DIAGNOSIS — I10 Essential (primary) hypertension: Secondary | ICD-10-CM | POA: Insufficient documentation

## 2023-04-02 DIAGNOSIS — S8991XA Unspecified injury of right lower leg, initial encounter: Secondary | ICD-10-CM | POA: Diagnosis not present

## 2023-04-02 DIAGNOSIS — E119 Type 2 diabetes mellitus without complications: Secondary | ICD-10-CM | POA: Insufficient documentation

## 2023-04-02 DIAGNOSIS — S8264XA Nondisplaced fracture of lateral malleolus of right fibula, initial encounter for closed fracture: Secondary | ICD-10-CM | POA: Insufficient documentation

## 2023-04-02 DIAGNOSIS — M25561 Pain in right knee: Secondary | ICD-10-CM | POA: Insufficient documentation

## 2023-04-02 DIAGNOSIS — S99911A Unspecified injury of right ankle, initial encounter: Secondary | ICD-10-CM | POA: Diagnosis present

## 2023-04-02 DIAGNOSIS — S8261XA Displaced fracture of lateral malleolus of right fibula, initial encounter for closed fracture: Secondary | ICD-10-CM

## 2023-04-02 DIAGNOSIS — M1711 Unilateral primary osteoarthritis, right knee: Secondary | ICD-10-CM | POA: Diagnosis not present

## 2023-04-02 DIAGNOSIS — S82424A Nondisplaced transverse fracture of shaft of right fibula, initial encounter for closed fracture: Secondary | ICD-10-CM | POA: Diagnosis not present

## 2023-04-02 MED ORDER — HYDROCODONE-ACETAMINOPHEN 5-325 MG PO TABS
1.0000 | ORAL_TABLET | Freq: Four times a day (QID) | ORAL | 0 refills | Status: DC | PRN
Start: 2023-04-02 — End: 2023-04-27
  Filled 2023-04-02: qty 8, 2d supply, fill #0

## 2023-04-02 MED ORDER — KETOROLAC TROMETHAMINE 15 MG/ML IJ SOLN
15.0000 mg | Freq: Once | INTRAMUSCULAR | Status: AC
  Administered 2023-04-02: 15 mg via INTRAMUSCULAR
  Filled 2023-04-02: qty 1

## 2023-04-02 NOTE — ED Provider Notes (Signed)
[place] EMERGENCY DEPARTMENT AT [facility] Provider Note   CSN: [id number] Arrival date & time: 04/02/23  7829     History  No chief complaint on file.   Barbara Reese is a 57 y.o. female.  HPI   Patient is a 57 year old female with past medical history of hypertension, hyperlipidemia, diabetes, presenting today for right knee and ankle pain.  She states that she was taking out the dogs when she slipped on the mud twisting her knee and ankle.  She has had pain primarily in the lateral aspect of her right ankle since that time.  She denies any numbness or tingling.  She has been ambulatory but with discomfort.  She has not taken anything for her symptoms.  She denies any pain into her hip.  She did not hit her head.  She had no LOC.  She has no chest pain, shortness of breath, nausea, or vomiting.  Is not on any blood thinners.  She denies any prior surgery or fractures to that extremity.  Home Medications Prior to Admission medications   Medication Sig Start Date End Date Taking? Authorizing Provider  HYDROcodone-acetaminophen (NORCO/VICODIN) 5-325 MG tablet Take 1 tablet by mouth every 6 (six) hours as needed. 04/02/23  Yes Edword Cu, , DO  amLODipine (NORVASC) 5 MG tablet Take 1 tablet (5 mg total) by mouth at bedtime. 01/29/23   Latrelle Dodrill, MD  cetirizine (ZYRTEC) 10 MG tablet Take 10 mg by mouth daily as needed for allergies.    [provider]  clobetasol cream (TEMOVATE) 0.05 % Apply topically to affected area daily as needed. 09/28/22   Latrelle Dodrill, MD  colchicine 0.6 MG tablet Take 1 tablet (0.6 mg total) by mouth daily as needed (gout or psuedogout pain). 11/04/21   Rodolph Bong, MD  fluconazole (DIFLUCAN) 150 MG tablet Take 1 tablet (150 mg total) by mouth today and 1 tablet in 72 hours. 03/17/23   Dameron, Nolberto Hanlon, DO  fluticasone (FLONASE) 50 MCG/ACT nasal spray Place 1 spray into both nostrils daily. 04/02/22   Latrelle Dodrill, MD  LORazepam (ATIVAN) 0.5 MG tablet Take 1-2 tablets by mouth 30 - 60 min prior to MRI. Do not drive with this medicine. 03/10/23   Rodolph Bong, MD  losartan (COZAAR) 25 MG tablet Take 1 tablet (25 mg total) by mouth daily. 01/29/23   Latrelle Dodrill, MD  pantoprazole (PROTONIX) 40 MG tablet Take 1 tablet (40 mg total) by mouth daily. 02/16/22   Latrelle Dodrill, MD  pravastatin (PRAVACHOL) 40 MG tablet Take 1 tablet (40 mg total) by mouth at bedtime. 01/29/23   Latrelle Dodrill, MD  Semaglutide,0.25 or 0.5MG /DOS, (OZEMPIC, 0.25 OR 0.5 MG/DOSE,) 2 MG/3ML SOPN Inject 0.5 mg into the skin once a week. 09/24/22   Latrelle Dodrill, MD      Allergies    Atorvastatin, Egg-derived products, Ezetimibe, Rosuvastatin, and Gabapentin    Review of Systems   Review of Systems Negative except for as noted above in HPI  Physical Exam Updated Vital Signs BP (!) 151/90   Pulse 87   Temp 98.2 F (36.8 C)   Resp 16   LMP 02/09/2016 (Exact Date)   SpO2 100%  Physical Exam Vitals and nursing note reviewed.  Constitutional:      General: She is not in acute distress.    Appearance: She is well-developed.  HENT:     Head: Normocephalic and atraumatic.  Eyes:  Conjunctiva/sclera: Conjunctivae normal.  Cardiovascular:     Rate and Rhythm: Normal rate and regular rhythm.     Heart sounds: No murmur heard. Pulmonary:     Effort: Pulmonary effort is normal. No respiratory distress.     Breath sounds: Normal breath sounds.  Musculoskeletal:     Cervical back: Neck supple.     Comments: Full range of motion of the right hip and knee.  No ligamentous laxity appreciated on the exam of the knee.  No significant edema or redness.  Mild edema appreciated over the right lateral malleolus. Dorsiflexion and plantarflexion without difficulty. 2+ pulses present.  Skin:    General: Skin is warm and dry.     Capillary Refill: Capillary refill takes less than 2 seconds.  Neurological:     Mental  Status: She is alert and oriented to person, place, and time.     Sensory: No sensory deficit.     Motor: No weakness.  Psychiatric:        Mood and Affect: Mood normal.     ED Results / Procedures / Treatments   Labs (all labs ordered are listed, but only abnormal results are displayed) Labs Reviewed - No data to display  EKG None  Radiology DG Knee Complete 4 Views Right  Result Date: 04/02/2023 CLINICAL DATA:  Pain after trauma EXAM: RIGHT KNEE - COMPLETE 4 VIEW COMPARISON:  None Available. FINDINGS: Small osteophytes seen of all 3 compartments. Mild joint space loss of the medial compartment. No fracture or dislocation. Preserved bone mineralization. No joint effusion on lateral view IMPRESSION: Mild tricompartmental degenerative changes greatest of the medial compartment Electronically Signed   By: Karen Kays M.D.   On: 04/02/2023 10:27   DG Ankle 2 Views Right  Result Date: 04/02/2023 CLINICAL DATA:  Pain after injury EXAM: RIGHT ANKLE - 2 VIEW COMPARISON:  None Available. FINDINGS: There is transverse nondisplaced fracture of the distal fibula at the base of the malleolus. Adjacent soft tissue swelling. No additional fracture or dislocation. Preserved bone mineralization. Well corticated plantar greater than Achilles calcaneal spur. IMPRESSION: Nondisplaced fracture of the base of the lateral malleolus with the adjacent soft tissue swelling. Electronically Signed   By: Karen Kays M.D.   On: 04/02/2023 10:25      Medications Ordered in ED Medications  ketorolac (TORADOL) 15 MG/ML injection 15 mg (15 mg Intramuscular Given 04/02/23 0913)    ED Course/ Medical Decision Making/ A&P Clinical Course as of 04/02/23 1622  Fri Apr 02, 2023  1034 IMPRESSION: Nondisplaced fracture of the base of the lateral malleolus with the adjacent soft tissue swelling.   [CD]    Clinical Course User Index [CD] Rhys Martini, DO                                Medical Decision  Making Amount and/or Complexity of Data Reviewed Radiology: ordered.  Risk Prescription drug management.   Patient is a 57 year old female with past medical history of hypertension, hyperlipidemia, diabetes, presenting today for right knee and ankle pain. On exam, patient is alert and oriented. Tenderness noted to the left ankle over the lateral malleolus.  No weakness or sensation deficit.  Will evaluate for injury to the right ankle and knee.  She has no hip tenderness and limited range of motion.  She did not sustain any injuries to her torso, neck, or head.  She not feel that further imaging  of this is required.  She is given Toradol IM for pain control.  Knee x-ray is unremarkable.  X-ray of the right ankle does reveal a nondisplaced fracture at the distal fibula at the base of the malleolus.  Will place in walking CAM boot for distal fibula fracture.  Given referral to orthopedics.  Also provided with short pain medication prescription.  She is provided with strict return precautions.  She states understanding and agree with plan for discharge at this time.   Final Clinical Impression(s) / ED Diagnoses Final diagnoses:  Closed fracture of proximal lateral malleolus of right fibula, initial encounter    Rx / DC Orders ED Discharge Orders          Ordered    Ambulatory referral to Orthopedic Surgery        04/02/23 1130    HYDROcodone-acetaminophen (NORCO/VICODIN) 5-325 MG tablet  Every 6 hours PRN        04/02/23 1130              Parisha Beaulac, Dora, DO 04/02/23 1624    Linwood Dibbles, MD 04/03/23 [phone]

## 2023-04-02 NOTE — ED Triage Notes (Signed)
Pt arrives from home after slipping on the wet ground this am while letting her dogs out. Pt states that she twisted her right ankle and heard something pop. Pt able to put some weight on left ankle but not much. Tearful during triage.

## 2023-04-02 NOTE — Discharge Instructions (Addendum)
Take pain medication as needed.  It will make you drowsy so do not take before driving or operating machinery.  Rest, ice, and elevate your ankle as often as possible.  Wear boot when up and moving around.  Follow-up with your primary care physician in the next 2 to 3 days.  Follow-up with orthopedics soon as possible.  Referral has been placed.  If you do not hear from them in the next 2-3 presentation, please feel free to reach out.  Monitor for signs worsening including increased pain, numbness, weakness, or swelling out of proportion.  Return to the ED for any worsening symptoms or further concerns.

## 2023-04-05 ENCOUNTER — Other Ambulatory Visit (HOSPITAL_COMMUNITY): Payer: Self-pay

## 2023-04-05 ENCOUNTER — Other Ambulatory Visit: Payer: Self-pay

## 2023-04-05 ENCOUNTER — Telehealth: Payer: Self-pay | Admitting: Family Medicine

## 2023-04-05 MED ORDER — IBUPROFEN 600 MG PO TABS
600.0000 mg | ORAL_TABLET | Freq: Three times a day (TID) | ORAL | 1 refills | Status: DC | PRN
  Filled 2023-04-05: qty 90, 30d supply, fill #0
  Filled 2023-06-28: qty 90, 30d supply, fill #1

## 2023-04-05 NOTE — Telephone Encounter (Signed)
Rx called in . Patient notified

## 2023-04-05 NOTE — Telephone Encounter (Signed)
Barbara Reese pt, scheduled to see [facility] on the 21st for her shoulder. Has been taking pain meds but would like to try high dose advil instead as the pain meds are not helping with the swelling.  [facility].

## 2023-04-05 NOTE — Telephone Encounter (Signed)
Pt intere

## 2023-04-14 ENCOUNTER — Ambulatory Visit (INDEPENDENT_AMBULATORY_CARE_PROVIDER_SITE_OTHER): Payer: 59 | Admitting: Orthopaedic Surgery

## 2023-04-14 ENCOUNTER — Other Ambulatory Visit (INDEPENDENT_AMBULATORY_CARE_PROVIDER_SITE_OTHER): Payer: 59

## 2023-04-14 DIAGNOSIS — M25571 Pain in right ankle and joints of right foot: Secondary | ICD-10-CM

## 2023-04-14 DIAGNOSIS — M25561 Pain in right knee: Secondary | ICD-10-CM | POA: Diagnosis not present

## 2023-04-14 DIAGNOSIS — M75122 Complete rotator cuff tear or rupture of left shoulder, not specified as traumatic: Secondary | ICD-10-CM | POA: Diagnosis not present

## 2023-04-14 DIAGNOSIS — S8264XD Nondisplaced fracture of lateral malleolus of right fibula, subsequent encounter for closed fracture with routine healing: Secondary | ICD-10-CM | POA: Diagnosis not present

## 2023-04-14 DIAGNOSIS — M1711 Unilateral primary osteoarthritis, right knee: Secondary | ICD-10-CM

## 2023-04-14 MED ORDER — LIDOCAINE HCL 1 % IJ SOLN
3.0000 mL | INTRAMUSCULAR | Status: AC | PRN
Start: 2023-04-14 — End: 2023-04-14
  Administered 2023-04-14: 3 mL

## 2023-04-14 MED ORDER — METHYLPREDNISOLONE ACETATE 40 MG/ML IJ SUSP
40.0000 mg | INTRAMUSCULAR | Status: AC | PRN
Start: 2023-04-14 — End: 2023-04-14
  Administered 2023-04-14: 40 mg via INTRA_ARTICULAR

## 2023-04-14 NOTE — Progress Notes (Signed)
The patient is a 57 year old who works at [employer] who I am seeing for the first time today but for multiple different issues.  She is 57 years old.  I was originally seeing her for her left shoulder with a known full-thickness rotator cuff tear.  This was seen on MRI back in July of this year.  She has had some chronic shoulder pain and weakness.  She denies any specific injuries.  She has worked hard all the life.  She is on a weight loss journey.  She is on medications to help with weight loss.  She is diabetic but has a hemoglobin A1c of below 6.  She also fell recently injuring her right knee.  She also injured her right ankle.  Her right ankle 12 days ago was shown to have a nondisplaced lateral malleolus fracture.  She is in a cam walking boot.  She is ambulating with a cane with full weightbearing.  She has also been dealing with right knee pain and she points to the medial aspect of her right knee as a source of her pain after this fall that she had had.  I was able to review all of her medications and medicines within epic in terms of past medical history and social history.  I was able to review x-rays of her right knee and right ankle and we needed to obtain new x-rays of her right ankle today since she has been weightbearing on the fracture to make sure that it is staying appropriately nondisplaced.  I was able to see the MRI of her right shoulder as well.  Examination of her left shoulder does show pain throughout the arc of motion of the shoulder but she is using her abductors and rotator cuff appropriately although there is painful and there is some slight weakness with external rotation and abduction.  There is a lot of pain in the [facility] joint with positive Neer and Hawkins signs.  Examination right knee she has medial joint line tenderness and patellofemoral crepitation.  The knee feels ligamentously stable.  Examination of her right ankle shows that the boot is in place.  She has a stable lateral  malleolus fracture with some mild swelling.  The MRI of the left shoulder does show a small rotator cuff tear that is full-thickness at the anterior aspect of the supraspinatus tendon.  There is slight retraction as well.  There is significant AC joint arthritic changes.  There is also subacromial and subdeltoid bursitis.  X-rays of her right knee shows medial joint line narrowing and patellofemoral narrowing.  X-rays today of the right ankle show a nondisplaced lateral malleolus fracture with intact ankle mortise.  This is a Weber B fracture.  There is a stable.  From an ankle standpoint we can transition her from a walking boot to an ASO and she will wear this as comfort allows.  She can weight-bear as tolerated.  She wants to stay away from surgery for now.  I did recommend an provide steroid injections in her left shoulder subacromial outlet and in her right knee.  She is a diabetic but has a hemoglobin A1c of below 6 oh which she will watch her blood glucose closely.  Will see her back in a month to see how she is doing overall.  Will have 3 views of her right ankle at that visit.  All questions and concerns were answered and addressed.  She is just taking 600 mg of ibuprofen twice a  day for pain management.     Procedure Note  Patient: Barbara Reese             Date of Birth: October 15, 1965           MRN: [id number]             Visit Date: 04/14/2023  Procedures: Visit Diagnoses:  1. Pain in right ankle and joints of right foot   2. Nontraumatic complete tear of left rotator cuff   3. Closed nondisplaced fracture of lateral malleolus of right fibula with routine healing, subsequent encounter   4. Acute pain of right knee   5. Unilateral primary osteoarthritis, right knee     Large Joint Inj: R knee on 04/14/2023 10:50 AM Indications: diagnostic evaluation and pain Details: 22 G 1.5 in needle, superolateral approach  Arthrogram: No  Medications: 3 mL lidocaine 1 %; 40 mg  methylPREDNISolone acetate 40 MG/ML Outcome: tolerated well, no immediate complications Procedure, treatment alternatives, risks and benefits explained, specific risks discussed. Consent was given by the patient. Immediately prior to procedure a time out was called to verify the correct patient, procedure, equipment, support staff and site/side marked as required. Patient was prepped and draped in the usual sterile fashion.    Large Joint Inj: L subacromial bursa on 04/14/2023 10:50 AM Indications: pain and diagnostic evaluation Details: 22 G 1.5 in needle  Arthrogram: No  Medications: 3 mL lidocaine 1 %; 40 mg methylPREDNISolone acetate 40 MG/ML Outcome: tolerated well, no immediate complications Procedure, treatment alternatives, risks and benefits explained, specific risks discussed. Consent was given by the patient. Immediately prior to procedure a time out was called to verify the correct patient, procedure, equipment, support staff and site/side marked as required. Patient was prepped and draped in the usual sterile fashion.

## 2023-04-27 ENCOUNTER — Telehealth: Payer: Self-pay | Admitting: Family Medicine

## 2023-04-27 ENCOUNTER — Encounter: Payer: Self-pay | Admitting: Family Medicine

## 2023-04-27 ENCOUNTER — Other Ambulatory Visit: Payer: Self-pay | Admitting: Family Medicine

## 2023-04-27 ENCOUNTER — Other Ambulatory Visit (HOSPITAL_COMMUNITY): Payer: Self-pay

## 2023-04-27 MED ORDER — OXYCODONE-ACETAMINOPHEN 10-325 MG PO TABS
1.0000 | ORAL_TABLET | Freq: Three times a day (TID) | ORAL | 0 refills | Status: DC | PRN
Start: 2023-04-27 — End: 2023-05-28
  Filled 2023-04-27: qty 15, 5d supply, fill #0

## 2023-04-27 MED ORDER — HYDROCODONE-ACETAMINOPHEN 5-325 MG PO TABS
1.0000 | ORAL_TABLET | Freq: Four times a day (QID) | ORAL | 0 refills | Status: DC | PRN
Start: 2023-04-27 — End: 2023-04-27
  Filled 2023-04-27: qty 15, 4d supply, fill #0

## 2023-04-27 NOTE — Telephone Encounter (Signed)
Pt sent MyChart msg for pain meds refill. Looks like Dr. Denyse Amass filled the Vicodin that was originally given by the ED. She cannot take this medicine and wanted the Percocet refilled. She also sent a MyChart msg about this.

## 2023-04-28 ENCOUNTER — Other Ambulatory Visit (HOSPITAL_COMMUNITY): Payer: Self-pay

## 2023-04-28 ENCOUNTER — Other Ambulatory Visit: Payer: Self-pay | Admitting: Family Medicine

## 2023-04-29 ENCOUNTER — Other Ambulatory Visit (HOSPITAL_COMMUNITY): Payer: Self-pay

## 2023-05-03 ENCOUNTER — Other Ambulatory Visit (HOSPITAL_COMMUNITY): Payer: Self-pay

## 2023-05-03 MED ORDER — PANTOPRAZOLE SODIUM 40 MG PO TBEC
40.0000 mg | DELAYED_RELEASE_TABLET | Freq: Every day | ORAL | 1 refills | Status: DC
  Filled 2023-05-03: qty 90, 90d supply, fill #0

## 2023-05-05 ENCOUNTER — Telehealth: Payer: Self-pay | Admitting: Orthopaedic Surgery

## 2023-05-05 NOTE — Telephone Encounter (Signed)
Pt called asking for right knee brace. Pt states knee is swollen and Dr Magnus Ivan has no openings until 9/18. Please call pt about this matter at [phone].

## 2023-05-05 NOTE — Telephone Encounter (Signed)
LMOM for patient of the below message  

## 2023-05-12 ENCOUNTER — Ambulatory Visit: Payer: 59 | Admitting: Orthopaedic Surgery

## 2023-05-26 ENCOUNTER — Other Ambulatory Visit (INDEPENDENT_AMBULATORY_CARE_PROVIDER_SITE_OTHER): Payer: Self-pay

## 2023-05-26 ENCOUNTER — Ambulatory Visit: Payer: 59 | Admitting: Orthopaedic Surgery

## 2023-05-26 DIAGNOSIS — M25561 Pain in right knee: Secondary | ICD-10-CM

## 2023-05-26 DIAGNOSIS — M25571 Pain in right ankle and joints of right foot: Secondary | ICD-10-CM | POA: Diagnosis not present

## 2023-05-26 NOTE — Progress Notes (Signed)
The patient comes in with 2 different issues today.  She is now 2 months out from a right ankle injury in which she sustained a minimally displaced lateral malleolus fracture at the level of the ankle mortise.  She has been in a cam walking boot.  She tried to transition to an ASO but was still uncomfortable.  She is also dealing with significant right knee pain.  X-rays had been obtained earlier for the right knee showing varus malalignment with medial compartment narrowing and osteophytes around the medial compartment.  She still has a lot of medial pain with that knee as well as locking catching after the mechanical fall injuring her ankle.  She is back to wearing a cam walking boot for the ankle.  She reports lateral ankle pain and swelling.  The ankle is in good position.  It is swollen to be expected but it is a good position.  There is no medial tenderness but definitely lateral tenderness.  3 views of the right ankle show a healing lateral malleolus fracture with an intact ankle mortise.  Examination of the right knee shows significant medial joint line tenderness and a positive McMurray's exam to the medial compartment of the knee.  At this point we will obtain a MRI of her right knee to assess the cartilage and to rule out a meniscal tear.  She can try an ankle sleeve over-the-counter for her ankle and needs to really get slowly as far as the ankle goes.  When we see her in 4 weeks I would like a repeat 3 views of the right ankle.  At her next visit we also need a weight and BMI calculation.

## 2023-05-27 ENCOUNTER — Other Ambulatory Visit: Payer: Self-pay

## 2023-05-27 DIAGNOSIS — M25561 Pain in right knee: Secondary | ICD-10-CM

## 2023-05-27 DIAGNOSIS — M1711 Unilateral primary osteoarthritis, right knee: Secondary | ICD-10-CM

## 2023-05-28 ENCOUNTER — Other Ambulatory Visit: Payer: Self-pay

## 2023-05-28 ENCOUNTER — Other Ambulatory Visit: Payer: Self-pay | Admitting: Family Medicine

## 2023-05-28 ENCOUNTER — Other Ambulatory Visit (HOSPITAL_COMMUNITY): Payer: Self-pay

## 2023-05-28 MED ORDER — OXYCODONE-ACETAMINOPHEN 10-325 MG PO TABS
1.0000 | ORAL_TABLET | Freq: Three times a day (TID) | ORAL | 0 refills | Status: DC | PRN
Start: 2023-05-28 — End: 2023-06-11
  Filled 2023-05-28: qty 15, 5d supply, fill #0

## 2023-05-28 NOTE — Telephone Encounter (Signed)
Last OV 03/10/23 Next OV 06/02/23  Last refill 04/27/23 Qty # 15/0  Controlled substance, forwarding to Dr. Denyse Amass.

## 2023-05-28 NOTE — Telephone Encounter (Signed)
Patient called to make an office visit with Dr. Denyse Amass and she is scheduled for Wednesday. Patient would like to get a refill of her percocet to get her to her appointment sent to [facility]

## 2023-06-02 ENCOUNTER — Ambulatory Visit (INDEPENDENT_AMBULATORY_CARE_PROVIDER_SITE_OTHER): Payer: 59 | Admitting: Family Medicine

## 2023-06-02 ENCOUNTER — Encounter: Payer: Self-pay | Admitting: Family Medicine

## 2023-06-02 VITALS — BP 128/80 | HR 78 | Ht 66.0 in | Wt 262.0 lb

## 2023-06-02 DIAGNOSIS — M1711 Unilateral primary osteoarthritis, right knee: Secondary | ICD-10-CM

## 2023-06-02 DIAGNOSIS — G8929 Other chronic pain: Secondary | ICD-10-CM | POA: Diagnosis not present

## 2023-06-02 DIAGNOSIS — M25561 Pain in right knee: Secondary | ICD-10-CM

## 2023-06-02 NOTE — Progress Notes (Signed)
   I, Stevenson Clinch, CMA acting as a scribe for [name], MD.  Barbara Reese is a 57 y.o. female who presents to [facility] at [facility] today for R ankle and R knee pain. Pt was previously seen by Dr. Denyse Amass on 03/10/23 for L shoulder pain.  Today, pt c/o R ankle pain. She suffered a non-displaced of the R lateral malleolus on 04/02/23 and this has been managed by Dr. Magnus Ivan. On 04/14/23, she was given a R knee steroid injection.   Today, patient reports continued pain in the right knee, no relief with injection. Notes improvement of ankle sx, healing. The ankle will swell at times.   Dx imaging: 05/26/23 R ankle XR  04/14/23 R ankle XR  04/02/23 R ankle & R knee XR  Pertinent review of systems: No fevers or chills  Relevant historical information: Diabetes and hypertension   Exam:  BP 128/80   Pulse 78   Ht 5\' 6"  (1.676 m)   Wt 262 lb (118.8 kg)   LMP 02/09/2016 (Exact Date)   SpO2 98%   BMI 42.29 kg/m  General: Well Developed, well nourished, and in no acute distress.   MSK: Right knee mild effusion normal-appearing otherwise.  Tender palpation medial joint line. Some laxity with MCL stress test. Intact strength. Increased quad to calf ratio.    Lab and Radiology Results  X-ray images right knee obtained at orthopedics August 2024 personally independently interpreted. Moderate medial compartment DJD is visible.  No acute fractures are present.    Assessment and Plan: 57 y.o. female with chronic right knee pain with recent exacerbation after fall.  She had a steroid injection relatively recently at orthopedics with little benefit.  Dr. Magnus Ivan ordered a knee MRI which has not been completed yet.  She is having a lot of pain so we talked about options.  Will work on authorization for hyaluronic acid injections.  This is likely to be beneficial and will not interfere with MRI or the likely outcomes of the MRI.  Additionally we can get started on a  medial off loader knee brace which may be helpful.  She has degenerative changes and some instability.  Her increased quad to calf ratio may require a custom knee brace.  Additionally recommend self-guided cognitive therapy workbook for chronic pain management.   PDMP not reviewed this encounter. No orders of the defined types were placed in this encounter.  No orders of the defined types were placed in this encounter.    Discussed warning signs or symptoms. Please see discharge instructions. Patient expresses understanding.   The above documentation has been reviewed and is accurate and complete [name], M.D.

## 2023-06-02 NOTE — Patient Instructions (Addendum)
Thank you for coming in today.   You should hear from MRI scheduling within 1 week. If you do not hear please let me know.   [phone] to call and schedule the MRI.   Let Dr Magnus Ivan and me know if it has not been authorized yet.   We will work on getting the gel shots authorized.   You should hear from [place] about the knee.   Let Dr Magnus Ivan and me know once you get your MRI pictures back.   The Pain Management Workbook: Powerful CBT and Mindfulness Skills to Take Control of Pain and Reclaim Your Life ThisMLS.nl

## 2023-06-03 ENCOUNTER — Telehealth: Payer: Self-pay

## 2023-06-03 NOTE — Telephone Encounter (Signed)
-----   Message from Barbara Reese sent at 06/02/2023 12:13 PM EDT ----- Regarding: gel right knee Please auth gel shots right knee

## 2023-06-07 NOTE — Telephone Encounter (Signed)
VOB initiated for Orthovisc for RIGHT knee OA.  

## 2023-06-08 NOTE — Telephone Encounter (Signed)
Prior Auth REQUIRED for [employer]

## 2023-06-09 ENCOUNTER — Ambulatory Visit: Payer: 59 | Admitting: Pharmacist

## 2023-06-10 NOTE — Telephone Encounter (Signed)
Prior Authorization initiated for [facility] via [employer] Case ID: [id number]

## 2023-06-11 ENCOUNTER — Encounter: Payer: Self-pay | Admitting: Family Medicine

## 2023-06-11 ENCOUNTER — Other Ambulatory Visit (HOSPITAL_COMMUNITY): Payer: Self-pay

## 2023-06-11 ENCOUNTER — Other Ambulatory Visit: Payer: Self-pay | Admitting: Family Medicine

## 2023-06-14 ENCOUNTER — Other Ambulatory Visit (HOSPITAL_COMMUNITY): Payer: Self-pay

## 2023-06-14 MED ORDER — OXYCODONE-ACETAMINOPHEN 10-325 MG PO TABS
1.0000 | ORAL_TABLET | Freq: Three times a day (TID) | ORAL | 0 refills | Status: DC | PRN
Start: 2023-06-14 — End: 2023-07-06
  Filled 2023-06-14: qty 15, 5d supply, fill #0

## 2023-06-16 DIAGNOSIS — M1711 Unilateral primary osteoarthritis, right knee: Secondary | ICD-10-CM | POA: Diagnosis not present

## 2023-06-16 NOTE — Telephone Encounter (Signed)
ORTHOVISC for RIGHT knee OA   Primary Insurance: [employer] Employee Co-pay: $50 Co-insurance: n/a Deductible: $100 of $100 met Prior Auth: APPROVED PA# [id number] Valid: 06/10/23-06/08/24    Knee Injection History 04/14/23 - Depo-Medrol RIGHT 06/06/2019 - Kenalog RIGHT       Prior Auth for ORTHOVISC APPROVED PA# [id number] Valid: 06/10/23-06/08/24

## 2023-06-23 ENCOUNTER — Ambulatory Visit: Payer: 59 | Admitting: Pharmacist

## 2023-06-28 ENCOUNTER — Other Ambulatory Visit: Payer: Self-pay | Admitting: Orthopaedic Surgery

## 2023-06-28 ENCOUNTER — Other Ambulatory Visit (HOSPITAL_COMMUNITY): Payer: Self-pay

## 2023-06-28 ENCOUNTER — Telehealth: Payer: Self-pay | Admitting: Orthopaedic Surgery

## 2023-06-28 NOTE — Telephone Encounter (Signed)
Pt called in stating her pain is too severe she has been taking her ibuprofen like she is supposed too and she has been wearing her sling, pt cannot get Injection until after the 21st of this month. Pt is willing to try whatever we can to help with this pain she does not feel she can wait until the injection on the 27th

## 2023-07-01 ENCOUNTER — Other Ambulatory Visit (HOSPITAL_COMMUNITY): Payer: Self-pay

## 2023-07-01 ENCOUNTER — Other Ambulatory Visit: Payer: Self-pay | Admitting: Family Medicine

## 2023-07-02 ENCOUNTER — Other Ambulatory Visit (HOSPITAL_COMMUNITY): Payer: Self-pay

## 2023-07-02 ENCOUNTER — Other Ambulatory Visit: Payer: Self-pay | Admitting: Family Medicine

## 2023-07-02 ENCOUNTER — Encounter (HOSPITAL_COMMUNITY): Payer: Self-pay

## 2023-07-02 NOTE — Telephone Encounter (Signed)
Patient calls nurse line regarding request for fluconazole.   She reports white, creamy discharge, vaginal itching/irritation.   Denies odor. No pelvic, back or abdominal pain. Denies painful urination or new sexual partners.   She reports recurrent yeast infections.   She also has a follow up appointment with Dr. Pollie Meyer on 07/13/23. She does not want to wait that long for treatment and prefers to not have to make additional appointment.   Please advise.   Veronda Prude, RN

## 2023-07-06 ENCOUNTER — Other Ambulatory Visit: Payer: 59

## 2023-07-06 ENCOUNTER — Other Ambulatory Visit (HOSPITAL_COMMUNITY): Payer: Self-pay

## 2023-07-06 ENCOUNTER — Other Ambulatory Visit: Payer: Self-pay | Admitting: Family Medicine

## 2023-07-06 NOTE — Telephone Encounter (Signed)
Last OV 06/02/23 Next OV not scheduled  Last refill 06/14/23 Qty #15/0  Controlled Substance, forwarding to Dr. Denyse Amass.

## 2023-07-07 ENCOUNTER — Other Ambulatory Visit (HOSPITAL_COMMUNITY): Payer: Self-pay

## 2023-07-07 MED ORDER — OXYCODONE-ACETAMINOPHEN 10-325 MG PO TABS
1.0000 | ORAL_TABLET | Freq: Three times a day (TID) | ORAL | 0 refills | Status: DC | PRN
Start: 2023-07-07 — End: 2023-07-26
  Filled 2023-07-07: qty 15, 5d supply, fill #0

## 2023-07-13 ENCOUNTER — Other Ambulatory Visit (HOSPITAL_COMMUNITY): Payer: Self-pay

## 2023-07-13 ENCOUNTER — Ambulatory Visit: Payer: 59 | Admitting: Family Medicine

## 2023-07-13 ENCOUNTER — Encounter: Payer: Self-pay | Admitting: Family Medicine

## 2023-07-13 VITALS — BP 128/74 | HR 91 | Ht 66.0 in | Wt 260.2 lb

## 2023-07-13 DIAGNOSIS — Z0000 Encounter for general adult medical examination without abnormal findings: Secondary | ICD-10-CM | POA: Diagnosis not present

## 2023-07-13 DIAGNOSIS — M170 Bilateral primary osteoarthritis of knee: Secondary | ICD-10-CM | POA: Diagnosis not present

## 2023-07-13 DIAGNOSIS — I10 Essential (primary) hypertension: Secondary | ICD-10-CM

## 2023-07-13 DIAGNOSIS — Z7984 Long term (current) use of oral hypoglycemic drugs: Secondary | ICD-10-CM | POA: Diagnosis not present

## 2023-07-13 DIAGNOSIS — K219 Gastro-esophageal reflux disease without esophagitis: Secondary | ICD-10-CM | POA: Diagnosis not present

## 2023-07-13 DIAGNOSIS — E119 Type 2 diabetes mellitus without complications: Secondary | ICD-10-CM

## 2023-07-13 LAB — POCT GLYCOSYLATED HEMOGLOBIN (HGB A1C): HbA1c, POC (controlled diabetic range): 6.6 % (ref 0.0–7.0)

## 2023-07-13 MED ORDER — PANTOPRAZOLE SODIUM 40 MG PO TBEC
40.0000 mg | DELAYED_RELEASE_TABLET | Freq: Every day | ORAL | 3 refills | Status: DC
  Filled 2023-07-13 – 2023-07-30 (×3): qty 90, 90d supply, fill #0
  Filled 2023-10-29: qty 90, 90d supply, fill #1
  Filled 2024-01-27: qty 90, 90d supply, fill #2
  Filled 2024-04-25: qty 90, 90d supply, fill #3
  Filled ????-??-??: fill #0

## 2023-07-13 NOTE — Patient Instructions (Signed)
It was great to see you again today.  Will complete FMLA paperwork  Schedule your eye exam  Refilled protonix  A1c is 6.6, looks great.  Be well, Dr. Pollie Meyer

## 2023-07-13 NOTE — Progress Notes (Unsigned)
  Date of Visit: 07/13/2023   SUBJECTIVE:   HPI:  Barbara Reese presents today for follow-up.  Diabetes: Previously was taking Ozempic but did not tolerate it and stopped it.  Currently not on any diabetes medications.  A1c today 6.6.  Hypertension: Currently taking losartan 25 mg daily and amlodipine 5 mg daily.  Tolerating these well.  GERD: Currently taking Protonix 40 mg daily.  Tolerating this well, it helps significantly with her symptoms.  FMLA renewal: Needs her FMLA paperwork updated.  Continues to have pain in bilateral knees, also in shoulder.  Is seeing orthopedics and sports medicine for these.  There are days where she has to call out of work due to flareups of pain.  She is prescribed oxycodone by sports medicine, also uses ibuprofen as needed.   OBJECTIVE:   BP 128/74   Pulse 91   Ht 5\' 6"  (1.676 m)   Wt 260 lb 3.2 oz (118 kg)   LMP 02/09/2016 (Exact Date)   SpO2 100%   BMI 42.00 kg/m  Gen: No acute distress, pleasant, cooperative, well-appearing HEENT: Normocephalic, atraumatic Heart: Regular rate and rhythm, no murmur Lungs: Clear to auscultation bilaterally, normal effort Neuro: Grossly nonfocal, speech normal Ext: No edema  ASSESSMENT/PLAN:   Assessment & Plan Type 2 diabetes mellitus without complication, without long-term current use of insulin (HCC) A1c remains well-controlled off of medications.  Encouraged ongoing lifestyle modification to keep diabetes under control. Some diminished sensation with monofilament testing today.  Advised patient look at her feet every day. Advised to schedule eye exam. Follow-up in 6 months for next A1c. Routine adult health maintenance Advised to schedule eye exam Foot exam today Previously received flu shot Declines COVID-vaccine Primary hypertension Well-controlled, continue losartan and amlodipine Gastroesophageal reflux disease, unspecified whether esophagitis present Well-controlled, continue Protonix Primary  osteoarthritis of both knees Continued arthritis of knees, also now shoulder as well.  I will renew her FMLA paperwork.   FOLLOW UP: Follow up in 6 months for above issues  [place] J. Pollie Meyer, MD [facility]

## 2023-07-14 ENCOUNTER — Ambulatory Visit: Payer: 59 | Admitting: Orthopaedic Surgery

## 2023-07-14 LAB — BASIC METABOLIC PANEL
BUN/Creatinine Ratio: 12 (ref 9–23)
BUN: 10 mg/dL (ref 6–24)
CO2: 24 mmol/L (ref 20–29)
Calcium: 10 mg/dL (ref 8.7–10.2)
Chloride: 105 mmol/L (ref 96–106)
Creatinine, Ser: 0.83 mg/dL (ref 0.57–1.00)
Glucose: 148 mg/dL — ABNORMAL HIGH (ref 70–99)
Potassium: 4.2 mmol/L (ref 3.5–5.2)
Sodium: 145 mmol/L — ABNORMAL HIGH (ref 134–144)
eGFR: 82 mL/min/{1.73_m2} (ref >59)

## 2023-07-15 DIAGNOSIS — Z0000 Encounter for general adult medical examination without abnormal findings: Secondary | ICD-10-CM | POA: Insufficient documentation

## 2023-07-15 NOTE — Assessment & Plan Note (Signed)
Advised to schedule eye exam Foot exam today Previously received flu shot Declines COVID-vaccine

## 2023-07-15 NOTE — Assessment & Plan Note (Signed)
A1c remains well-controlled off of medications.  Encouraged ongoing lifestyle modification to keep diabetes under control. Some diminished sensation with monofilament testing today.  Advised patient look at her feet every day. Advised to schedule eye exam. Follow-up in 6 months for next A1c.

## 2023-07-15 NOTE — Assessment & Plan Note (Signed)
Well controlled, continue losartan and amlodipine.

## 2023-07-15 NOTE — Assessment & Plan Note (Signed)
Well-controlled, continue Protonix

## 2023-07-15 NOTE — Assessment & Plan Note (Signed)
Continued arthritis of knees, also now shoulder as well.  I will renew her FMLA paperwork.

## 2023-07-16 ENCOUNTER — Other Ambulatory Visit (HOSPITAL_COMMUNITY): Payer: Self-pay

## 2023-07-19 ENCOUNTER — Encounter: Payer: Self-pay | Admitting: Family Medicine

## 2023-07-19 ENCOUNTER — Ambulatory Visit: Payer: 59 | Admitting: Orthopaedic Surgery

## 2023-07-20 NOTE — Telephone Encounter (Signed)
FMLA paperwork completed  Placing in RN inbox Routing to RN pool  Latrelle Dodrill, MD

## 2023-07-21 ENCOUNTER — Ambulatory Visit: Payer: 59 | Admitting: Orthopaedic Surgery

## 2023-07-21 ENCOUNTER — Other Ambulatory Visit: Payer: 59

## 2023-07-21 ENCOUNTER — Encounter: Payer: Self-pay | Admitting: Orthopaedic Surgery

## 2023-07-21 DIAGNOSIS — M75122 Complete rotator cuff tear or rupture of left shoulder, not specified as traumatic: Secondary | ICD-10-CM

## 2023-07-21 DIAGNOSIS — M25512 Pain in left shoulder: Secondary | ICD-10-CM | POA: Diagnosis not present

## 2023-07-21 DIAGNOSIS — G8929 Other chronic pain: Secondary | ICD-10-CM

## 2023-07-21 MED ORDER — METHYLPREDNISOLONE ACETATE 40 MG/ML IJ SUSP
40.0000 mg | INTRAMUSCULAR | Status: AC | PRN
Start: 2023-07-21 — End: 2023-07-21
  Administered 2023-07-21: 40 mg via INTRA_ARTICULAR

## 2023-07-21 MED ORDER — LIDOCAINE HCL 1 % IJ SOLN
3.0000 mL | INTRAMUSCULAR | Status: AC | PRN
Start: 2023-07-21 — End: 2023-07-21
  Administered 2023-07-21: 3 mL

## 2023-07-21 NOTE — Progress Notes (Signed)
The patient comes into the requesting a steroid injection in her left shoulder.  We provided 1 in the left shoulder back in August.  She was sent to me by Dr. [name] having a known rotator cuff tear of the supraspinatus tendon in that left shoulder.  She was not interested in any type of surgery at the time.  We talked her in length in detail about this and she still would like to hold off on any type of surgical intervention.  She is 57 years old and active.  We have been following her for a knee and ankle as well.  We had wanted a MRI of her right knee but this has been delayed as well.  She would really just like a steroid junction her left shoulder today.  She is a diabetic but her last hemoglobin A1c was in the mid 6 range.  Her left shoulder does move fluidly with some deficit in the rotator cuff which is only mild.  She does have pain in the subacromial outlet and AC joint which she does have moderate arthritis in her AC joint.  There is positive Neer and Hawkins signs as well.  Per her request I did provide a steroid injection in her left shoulder subacromial outlet which she tolerated well.  She will watch her blood glucose closely.  For now follow-up can be as needed and if things worsen she knows to let us know.    Procedure Note  Patient: Barbara Reese             Date of Birth: February 25, 1966           MRN: [id number]             Visit Date: 07/21/2023  Procedures: Visit Diagnoses:  1. Chronic left shoulder pain   2. Nontraumatic complete tear of left rotator cuff     Large Joint Inj: L subacromial bursa on 07/21/2023 8:46 AM Indications: pain and diagnostic evaluation Details: 22 G 1.5 in needle  Arthrogram: No  Medications: 3 mL lidocaine 1 %; 40 mg methylPREDNISolone acetate 40 MG/ML Outcome: tolerated well, no immediate complications Procedure, treatment alternatives, risks and benefits explained, specific risks discussed. Consent was given by the patient. Immediately  prior to procedure a time out was called to verify the correct patient, procedure, equipment, support staff and site/side marked as required. Patient was prepped and draped in the usual sterile fashion.

## 2023-07-21 NOTE — Telephone Encounter (Signed)
Form faxed to provided number. Copy made and placed in batch scanning.   Veronda Prude, RN

## 2023-07-26 ENCOUNTER — Other Ambulatory Visit: Payer: Self-pay | Admitting: Family Medicine

## 2023-07-26 ENCOUNTER — Encounter: Payer: Self-pay | Admitting: Family Medicine

## 2023-07-26 ENCOUNTER — Other Ambulatory Visit (HOSPITAL_COMMUNITY): Payer: Self-pay

## 2023-07-26 NOTE — Telephone Encounter (Signed)
Last OV 06/02/23 Next OV not scheduled  Last refill 07/07/23 Qty #15/0  Controlled substance, forwarding to Dr. Denyse Amass.

## 2023-07-27 ENCOUNTER — Other Ambulatory Visit (HOSPITAL_COMMUNITY): Payer: Self-pay

## 2023-07-27 MED ORDER — OXYCODONE-ACETAMINOPHEN 10-325 MG PO TABS
1.0000 | ORAL_TABLET | Freq: Three times a day (TID) | ORAL | 0 refills | Status: DC | PRN
Start: 2023-07-27 — End: 2023-08-19
  Filled 2023-07-27: qty 15, 5d supply, fill #0

## 2023-07-28 NOTE — Telephone Encounter (Signed)
FMLA updated, will return to RN team  Latrelle Dodrill, MD

## 2023-07-30 ENCOUNTER — Other Ambulatory Visit (HOSPITAL_COMMUNITY): Payer: Self-pay

## 2023-08-02 ENCOUNTER — Other Ambulatory Visit: Payer: Self-pay | Admitting: Family Medicine

## 2023-08-02 ENCOUNTER — Encounter: Payer: Self-pay | Admitting: Orthopaedic Surgery

## 2023-08-02 DIAGNOSIS — Z1231 Encounter for screening mammogram for malignant neoplasm of breast: Secondary | ICD-10-CM

## 2023-08-03 ENCOUNTER — Other Ambulatory Visit: Payer: 59

## 2023-08-03 ENCOUNTER — Other Ambulatory Visit (HOSPITAL_COMMUNITY): Payer: Self-pay

## 2023-08-03 ENCOUNTER — Other Ambulatory Visit: Payer: Self-pay

## 2023-08-19 ENCOUNTER — Ambulatory Visit
Admission: RE | Admit: 2023-08-19 | Discharge: 2023-08-19 | Disposition: A | Discharge status: Home / Self Care | Payer: 59 | Source: Ambulatory Visit | Attending: Orthopaedic Surgery | Admitting: Orthopaedic Surgery

## 2023-08-19 ENCOUNTER — Other Ambulatory Visit (HOSPITAL_COMMUNITY): Payer: Self-pay

## 2023-08-19 ENCOUNTER — Other Ambulatory Visit: Payer: Self-pay | Admitting: Family Medicine

## 2023-08-19 DIAGNOSIS — M25561 Pain in right knee: Secondary | ICD-10-CM

## 2023-08-19 DIAGNOSIS — M1711 Unilateral primary osteoarthritis, right knee: Secondary | ICD-10-CM | POA: Diagnosis not present

## 2023-08-19 NOTE — Telephone Encounter (Signed)
Last OV 06/02/23 Next OV not scheduled  Last refill 07/27/23 Qty #15/0  Controlled substance, forwarding to Dr. Denyse Amass to review when he returns to the office 08/22/33.

## 2023-08-23 ENCOUNTER — Other Ambulatory Visit (HOSPITAL_COMMUNITY): Payer: Self-pay

## 2023-08-23 MED ORDER — OXYCODONE-ACETAMINOPHEN 10-325 MG PO TABS
1.0000 | ORAL_TABLET | Freq: Three times a day (TID) | ORAL | 0 refills | Status: DC | PRN
Start: 2023-08-23 — End: 2023-09-17
  Filled 2023-08-23: qty 15, 5d supply, fill #0

## 2023-08-23 NOTE — Telephone Encounter (Signed)
Will need to re-run benefits for 2025 on or after 08/25/23.

## 2023-08-26 NOTE — Telephone Encounter (Signed)
 VOB initiated for 202 coverage of Orthovisc.

## 2023-08-31 ENCOUNTER — Ambulatory Visit
Admission: RE | Admit: 2023-08-31 | Discharge: 2023-08-31 | Disposition: A | Discharge status: Home / Self Care | Payer: 59 | Source: Ambulatory Visit | Attending: Family Medicine | Admitting: Family Medicine

## 2023-08-31 DIAGNOSIS — Z1231 Encounter for screening mammogram for malignant neoplasm of breast: Secondary | ICD-10-CM | POA: Diagnosis not present

## 2023-09-01 NOTE — Telephone Encounter (Signed)
 Medical Buy and Annette Stable - Prior Authorization REQUIRED

## 2023-09-02 NOTE — Telephone Encounter (Signed)
 Prior Authorization on file and valid PA# [id number] Valid: 06/10/23-06/08/24

## 2023-09-08 ENCOUNTER — Telehealth: Payer: Self-pay

## 2023-09-08 NOTE — Telephone Encounter (Signed)
 FMLA - [employer] Received 09/08/23 Due: not listed Return fax number: [phone]

## 2023-09-09 NOTE — Telephone Encounter (Signed)
Form completed and placed on Dr. Corey's desk to review and sign.  

## 2023-09-10 NOTE — Telephone Encounter (Signed)
Orthovisc for RIGHT knee OA   Medical Buy and Bill  Primary Insurance: [employer] Employee Co-pay: $50 Co-insurance: 0% Deductible: $0 of $600 met - must be met for coverage to apply Prior Auth: APPROVED PA# [id number] Valid: 06/10/23-06/08/24   Knee Injection History 04/14/23 - Depo-Medrol RIGHT 06/06/2019 - Kenalog RIGHT

## 2023-09-10 NOTE — Telephone Encounter (Signed)
Form placed up front yesterday afternoon for faxing/scanning.

## 2023-09-13 NOTE — Telephone Encounter (Signed)
Holding until needed by patient.

## 2023-09-14 ENCOUNTER — Other Ambulatory Visit: Payer: Self-pay | Admitting: Family Medicine

## 2023-09-14 ENCOUNTER — Other Ambulatory Visit (HOSPITAL_COMMUNITY): Payer: Self-pay

## 2023-09-14 MED ORDER — FLUTICASONE PROPIONATE 50 MCG/ACT NA SUSP
1.0000 | Freq: Every day | NASAL | 5 refills | Status: AC
  Filled 2023-09-14: qty 16, 60d supply, fill #0
  Filled 2024-01-03 – 2024-02-10 (×3): qty 16, 60d supply, fill #1
  Filled 2024-07-12 – 2024-07-27 (×2): qty 16, 60d supply, fill #2

## 2023-09-17 ENCOUNTER — Other Ambulatory Visit: Payer: Self-pay | Admitting: Family Medicine

## 2023-09-17 ENCOUNTER — Other Ambulatory Visit (HOSPITAL_COMMUNITY): Payer: Self-pay

## 2023-09-20 ENCOUNTER — Other Ambulatory Visit: Payer: Self-pay | Admitting: Family Medicine

## 2023-09-20 ENCOUNTER — Other Ambulatory Visit (HOSPITAL_COMMUNITY): Payer: Self-pay

## 2023-09-20 MED ORDER — OXYCODONE-ACETAMINOPHEN 10-325 MG PO TABS
1.0000 | ORAL_TABLET | Freq: Three times a day (TID) | ORAL | 0 refills | Status: DC | PRN
Start: 2023-09-20 — End: 2023-10-11
  Filled 2023-09-20: qty 15, 5d supply, fill #0

## 2023-09-20 NOTE — Telephone Encounter (Signed)
Last OV 06/02/23 Next OV not scheduled  Last refill 08/23/23 Qty #15/0  Controlled substance, forwarding to Dr. Denyse Amass.

## 2023-09-21 ENCOUNTER — Other Ambulatory Visit: Payer: Self-pay | Admitting: Family Medicine

## 2023-09-21 DIAGNOSIS — R21 Rash and other nonspecific skin eruption: Secondary | ICD-10-CM

## 2023-09-22 DIAGNOSIS — R110 Nausea: Secondary | ICD-10-CM | POA: Diagnosis not present

## 2023-09-22 DIAGNOSIS — B349 Viral infection, unspecified: Secondary | ICD-10-CM | POA: Diagnosis not present

## 2023-09-22 DIAGNOSIS — H9201 Otalgia, right ear: Secondary | ICD-10-CM | POA: Diagnosis not present

## 2023-09-22 DIAGNOSIS — H6501 Acute serous otitis media, right ear: Secondary | ICD-10-CM | POA: Diagnosis not present

## 2023-09-22 MED ORDER — CLOBETASOL PROPIONATE 0.05 % EX CREA
1.0000 | TOPICAL_CREAM | Freq: Every day | CUTANEOUS | 0 refills | Status: DC | PRN
  Filled 2023-09-22: qty 45, 30d supply, fill #0

## 2023-09-23 ENCOUNTER — Other Ambulatory Visit (HOSPITAL_COMMUNITY): Payer: Self-pay

## 2023-09-23 ENCOUNTER — Other Ambulatory Visit: Payer: Self-pay

## 2023-09-23 MED ORDER — DEXAMETHASONE 6 MG PO TABS
6.0000 mg | ORAL_TABLET | Freq: Every day | ORAL | 0 refills | Status: DC
  Filled 2023-09-23: qty 5, 5d supply, fill #0

## 2023-09-28 ENCOUNTER — Other Ambulatory Visit (HOSPITAL_COMMUNITY): Payer: Self-pay

## 2023-09-28 ENCOUNTER — Ambulatory Visit (INDEPENDENT_AMBULATORY_CARE_PROVIDER_SITE_OTHER): Payer: Commercial Managed Care - PPO | Admitting: Student

## 2023-09-28 ENCOUNTER — Ambulatory Visit: Payer: Commercial Managed Care - PPO | Admitting: Student

## 2023-09-28 ENCOUNTER — Encounter: Payer: Self-pay | Admitting: Student

## 2023-09-28 VITALS — BP 122/75 | HR 80 | Wt 264.6 lb

## 2023-09-28 DIAGNOSIS — H6691 Otitis media, unspecified, right ear: Secondary | ICD-10-CM | POA: Diagnosis not present

## 2023-09-28 DIAGNOSIS — H6991 Unspecified Eustachian tube disorder, right ear: Secondary | ICD-10-CM

## 2023-09-28 MED ORDER — AMOXICILLIN-POT CLAVULANATE 875-125 MG PO TABS
1.0000 | ORAL_TABLET | Freq: Two times a day (BID) | ORAL | 0 refills | Status: DC
  Filled 2023-09-28: qty 20, 10d supply, fill #0

## 2023-09-28 NOTE — Assessment & Plan Note (Addendum)
 Patient with history of his right eustachian tube disorder and recurrent right OM presenting with symptoms which she expressed are similar in the past that has responded to abx. On exam does have buldging TM. Will treat with Augmentin  being that this is worked for her in the past.  However most concerning percent could possibly have TMJ although her presentation is unusual.   - Rx Augmentin  875 g twice daily for 5 days -Referral to ENT -Recommend as needed Motrin 

## 2023-09-28 NOTE — Progress Notes (Signed)
    SUBJECTIVE:   CHIEF COMPLAINT / HPI:   - 58 year old female hx of Eustashian tube disorder and multiple right inner ear infection. - Seen in ED on 09/22/23 for Viral URI and Right acute serous otitis media treated with decadron  6mg  for 5 days and Toradol  - Patient reports she has had similar symptoms in the past and responded well to Augmentin  - Seen ENT in the past and diagnosed her with eustachian tube disorder -Her ENT doctor is now retired  - Her symptoms are always occurring in the right ear - Last flareup was about a year ago. - Reports  associated nausea with the pain and chills last week - Denies any ringing or impaired hearing. No clicking of jaw with chewing  PERTINENT  PMH / PSH: Reviewed   OBJECTIVE:   BP 122/75   Pulse 80   Wt 264 lb 9.6 oz (120 kg)   LMP 02/09/2016 (Exact Date)   BMI 42.71 kg/m    Physical Exam General: Alert, well appearing, NAD Ear: Able to visualize left tympanic membrane, has bulging of the right tympanic membrane. No notable external/mastoid tenderness Cardiovascular: RRR, No Murmurs, Normal S2/S2 Respiratory: CTAB, No wheezing or Rales Abdomen: No distension or tenderness   ASSESSMENT/PLAN:   Acute bacterial middle ear infection, right Patient with history of his right eustachian tube disorder and recurrent right OM presenting with symptoms which she expressed are similar in the past that has responded to abx. On exam does have buldging TM. Will treat with Augmentin  being that this is worked for her in the past.  However most concerning percent could possibly have TMJ although her presentation is unusual.   - Rx Augmentin  875 g twice daily for 5 days -Referral to ENT -Recommend as needed Motrin    Norleen April, MD [facility]

## 2023-09-28 NOTE — Patient Instructions (Signed)
 Pleasure to meet you today.  Your tympanic membrane does look to be bulging.  I will go ahead and sent in an antibiotic of Augmentin  which you will take for 5 days for infection of your inner ear.  I also sent in referral to an ENT doctor for further evaluation of your ears.

## 2023-10-05 ENCOUNTER — Other Ambulatory Visit (HOSPITAL_COMMUNITY): Payer: Self-pay

## 2023-10-06 ENCOUNTER — Encounter (INDEPENDENT_AMBULATORY_CARE_PROVIDER_SITE_OTHER): Payer: Self-pay | Admitting: Otolaryngology

## 2023-10-11 ENCOUNTER — Other Ambulatory Visit: Payer: Self-pay | Admitting: Family Medicine

## 2023-10-11 ENCOUNTER — Other Ambulatory Visit (HOSPITAL_COMMUNITY): Payer: Self-pay

## 2023-10-11 MED ORDER — OXYCODONE-ACETAMINOPHEN 10-325 MG PO TABS
1.0000 | ORAL_TABLET | Freq: Three times a day (TID) | ORAL | 0 refills | Status: DC | PRN
  Filled 2023-10-11: qty 15, 5d supply, fill #0

## 2023-10-11 NOTE — Telephone Encounter (Signed)
 Patient called to follow up on this request. Advised that we received it this morning and would make Dr Denyse Amass aware.

## 2023-11-01 NOTE — Progress Notes (Unsigned)
 Rubin Payor, PhD, LAT, ATC acting as a scribe for [name], MD.  Barbara Reese is a 58 y.o. female who presents to [facility] today for L foot pain. Pt was previously seen by Dr. Denyse Amass on 06/02/23 for R knee OA. She's been seen in 2023 for podagra.  Today, pt c/o L foot pain x a month. She notes there is new Holiday representative at her house and there is a lot of debris in her front yard. She thinks she may have stepped on a rock. Shooting pain. Pt locates pain to the lateral aspect of her L foot. She thinks she had an inversion injury.   L foot swelling: no, but ankle does swell Aggravates: walking Treatments tried: cold, Voltaren gel, IBU, changing shoes, percocet, elevation  Dx testing: 12/24/21 Labs 11/04/21 L foot XR  Pertinent review of systems: No fevers or chills  Relevant historical information: Hypertension and diabetes   Exam:  BP 138/82   Pulse 95   Ht 5\' 6"  (1.676 m)   Wt 266 lb (120.7 kg)   LMP 02/09/2016 (Exact Date)   SpO2 95%   BMI 42.93 kg/m  General: Well Developed, well nourished, and in no acute distress.   MSK: Foot and ankle swelling at lateral foot and ankle.  Tender palpation lateral proximal fifth metatarsal and midfoot.  Normal foot and ankle motion intact strength.  Pain with resisted foot eversion and dorsiflexion.    Lab and Radiology Results  Diagnostic Limited MSK Ultrasound of: Left lateral foot and ankle Peroneal tendons are intact.  No acute fracture is visible along bony cortex.  No significant joint effusion is present. Impression: Swelling left lateral midfoot no acute fractures or tendon tears are present.   X-ray images left foot and ankle obtained today personally and independently interpreted.  Left ankle: Midfoot DJD as above.  No acute fractures are visible.  Left foot: Foot DJD.  Osteophyte posterior and plantar calcaneus.  Midfoot DJD.  No acute fracture at the proximal fifth metatarsal or  cuboid visible.  Await formal radiology review  Assessment and Plan: 58 y.o. female with left lateral foot and ankle pain.  Etiology is unclear.  Exacerbation of DJD or sprain is a possibility.  Await radiology overread of x-ray.  For now we will use a cam walker boot and immobilize the foot and ankle.  Recheck in 2 weeks.  Oxycodone and tizanidine prescribed.   PDMP reviewed during this encounter. Orders Placed This Encounter  Procedures   [place] LIMITED JOINT SPACE STRUCTURES LOW LEFT(NO LINKED CHARGES)    Reason for Exam (SYMPTOM  OR DIAGNOSIS REQUIRED):   left foot pain    Preferred imaging location?:   [facility] Ankle Complete Left    Standing Status:   Future    Number of Occurrences:   1    Expiration Date:   11/01/2024    Reason for Exam (SYMPTOM  OR DIAGNOSIS REQUIRED):   left ankle pain    Preferred imaging location?:   [place] [facility]    Is patient pregnant?:   No   DG Foot Complete Left    Standing Status:   Future    Number of Occurrences:   1    Expiration Date:   11/01/2024    Reason for Exam (SYMPTOM  OR DIAGNOSIS REQUIRED):   left foot pain    Preferred imaging location?:   [facility]  Is patient pregnant?:   No   Meds ordered this encounter  Medications   oxyCODONE-acetaminophen (PERCOCET) 10-325 MG tablet    Sig: Take 1 tablet by mouth every 8 (eight) hours as needed for pain.    Dispense:  15 tablet    Refill:  0   tiZANidine (ZANAFLEX) 4 MG tablet    Sig: Take 1 tablet (4 mg total) by mouth every 8 (eight) hours as needed.    Dispense:  30 tablet    Refill:  1     Discussed warning signs or symptoms. Please see discharge instructions. Patient expresses understanding.   The above documentation has been reviewed and is accurate and complete [name], M.D.

## 2023-11-02 ENCOUNTER — Ambulatory Visit (INDEPENDENT_AMBULATORY_CARE_PROVIDER_SITE_OTHER): Admitting: Family Medicine

## 2023-11-02 ENCOUNTER — Other Ambulatory Visit: Payer: Self-pay

## 2023-11-02 ENCOUNTER — Other Ambulatory Visit (HOSPITAL_COMMUNITY): Payer: Self-pay

## 2023-11-02 ENCOUNTER — Ambulatory Visit (INDEPENDENT_AMBULATORY_CARE_PROVIDER_SITE_OTHER)

## 2023-11-02 VITALS — BP 138/82 | HR 95 | Ht 66.0 in | Wt 266.0 lb

## 2023-11-02 DIAGNOSIS — M25572 Pain in left ankle and joints of left foot: Secondary | ICD-10-CM | POA: Diagnosis not present

## 2023-11-02 DIAGNOSIS — M79672 Pain in left foot: Secondary | ICD-10-CM

## 2023-11-02 DIAGNOSIS — M19072 Primary osteoarthritis, left ankle and foot: Secondary | ICD-10-CM | POA: Diagnosis not present

## 2023-11-02 DIAGNOSIS — M7732 Calcaneal spur, left foot: Secondary | ICD-10-CM | POA: Diagnosis not present

## 2023-11-02 MED ORDER — TIZANIDINE HCL 4 MG PO TABS
4.0000 mg | ORAL_TABLET | Freq: Three times a day (TID) | ORAL | 1 refills | Status: AC | PRN
  Filled 2023-11-02: qty 30, 10d supply, fill #0

## 2023-11-02 MED ORDER — OXYCODONE-ACETAMINOPHEN 10-325 MG PO TABS
1.0000 | ORAL_TABLET | Freq: Three times a day (TID) | ORAL | 0 refills | Status: DC | PRN
Start: 2023-11-02 — End: 2023-11-23
  Filled 2023-11-02: qty 15, 5d supply, fill #0

## 2023-11-02 NOTE — Patient Instructions (Addendum)
 Thank you for coming in today.   Please get an Xray today before you leave.  See you back in 2 weeks.

## 2023-11-03 ENCOUNTER — Other Ambulatory Visit (HOSPITAL_COMMUNITY): Payer: Self-pay

## 2023-11-09 ENCOUNTER — Ambulatory Visit: Admitting: Family Medicine

## 2023-11-09 ENCOUNTER — Telehealth: Payer: Self-pay

## 2023-11-09 NOTE — Telephone Encounter (Signed)
 Patient LVM on nurse line requesting a refill Hydroxyzine 10mg .  She reports she uses this for itching skin.   I attempted to call patient back, however no answer.   Will forward to PCP.

## 2023-11-12 ENCOUNTER — Encounter: Payer: Self-pay | Admitting: Family Medicine

## 2023-11-12 ENCOUNTER — Other Ambulatory Visit (HOSPITAL_COMMUNITY): Payer: Self-pay

## 2023-11-12 MED ORDER — HYDROXYZINE HCL 10 MG PO TABS
10.0000 mg | ORAL_TABLET | Freq: Three times a day (TID) | ORAL | 0 refills | Status: DC | PRN
  Filled 2023-11-12: qty 30, 10d supply, fill #0

## 2023-11-12 NOTE — Telephone Encounter (Signed)
 Rx sent Latrelle Dodrill, MD

## 2023-11-12 NOTE — Progress Notes (Signed)
 Left foot x-ray shows some arthritis at the big toe and some heel spurs.

## 2023-11-12 NOTE — Progress Notes (Signed)
Left ankle x-ray shows a little bit of arthritis.

## 2023-11-17 ENCOUNTER — Ambulatory Visit: Admitting: Family Medicine

## 2023-11-23 ENCOUNTER — Other Ambulatory Visit (HOSPITAL_COMMUNITY): Payer: Self-pay

## 2023-11-23 ENCOUNTER — Other Ambulatory Visit: Payer: Self-pay

## 2023-11-23 ENCOUNTER — Other Ambulatory Visit: Payer: Self-pay | Admitting: Family Medicine

## 2023-11-23 NOTE — Telephone Encounter (Signed)
 Last OV 11/02/23 Next OV not scheduled  Last refill 11/02/23 Qty #15/0

## 2023-11-24 ENCOUNTER — Other Ambulatory Visit (HOSPITAL_COMMUNITY): Payer: Self-pay

## 2023-11-24 MED ORDER — OXYCODONE-ACETAMINOPHEN 10-325 MG PO TABS
1.0000 | ORAL_TABLET | Freq: Three times a day (TID) | ORAL | 0 refills | Status: DC | PRN
  Filled 2023-11-24: qty 15, 5d supply, fill #0

## 2023-12-13 ENCOUNTER — Telehealth: Payer: Self-pay | Admitting: Family Medicine

## 2023-12-13 NOTE — Telephone Encounter (Signed)
 Forwarding to Dr. Denyse Amass.

## 2023-12-13 NOTE — Telephone Encounter (Signed)
 Patient called and asked if Dr. Alease Hunter could refill her pain medicine. She says her left foot is still bothering her pretty bad and she just finished her last pill today.

## 2023-12-14 ENCOUNTER — Other Ambulatory Visit (HOSPITAL_COMMUNITY): Payer: Self-pay

## 2023-12-14 MED ORDER — OXYCODONE-ACETAMINOPHEN 10-325 MG PO TABS
1.0000 | ORAL_TABLET | Freq: Three times a day (TID) | ORAL | 0 refills | Status: DC | PRN
Start: 2023-12-14 — End: 2024-01-03
  Filled 2023-12-14: qty 15, 5d supply, fill #0

## 2023-12-14 NOTE — Telephone Encounter (Signed)
Patient called back to follow up on this refill request.

## 2023-12-14 NOTE — Addendum Note (Signed)
 Addended by: Syliva Even on: 12/14/2023 12:31 PM   Modules accepted: Orders

## 2023-12-14 NOTE — Telephone Encounter (Signed)
 Medication refilled

## 2023-12-24 ENCOUNTER — Ambulatory Visit: Admitting: Podiatry

## 2024-01-03 ENCOUNTER — Other Ambulatory Visit (HOSPITAL_COMMUNITY): Payer: Self-pay

## 2024-01-03 ENCOUNTER — Other Ambulatory Visit: Payer: Self-pay | Admitting: Family Medicine

## 2024-01-03 DIAGNOSIS — R21 Rash and other nonspecific skin eruption: Secondary | ICD-10-CM

## 2024-01-04 ENCOUNTER — Other Ambulatory Visit (HOSPITAL_COMMUNITY): Payer: Self-pay

## 2024-01-04 ENCOUNTER — Other Ambulatory Visit: Payer: Self-pay

## 2024-01-04 MED ORDER — OXYCODONE-ACETAMINOPHEN 10-325 MG PO TABS
1.0000 | ORAL_TABLET | Freq: Three times a day (TID) | ORAL | 0 refills | Status: AC | PRN
Start: 2024-01-04 — End: ?
  Filled 2024-01-04: qty 15, 5d supply, fill #0

## 2024-01-04 MED ORDER — CLOBETASOL PROPIONATE 0.05 % EX CREA
1.0000 | TOPICAL_CREAM | Freq: Every day | CUTANEOUS | 0 refills | Status: DC | PRN
  Filled 2024-01-04: qty 45, 90d supply, fill #0
  Filled 2024-02-10: qty 45, 30d supply, fill #0

## 2024-01-05 ENCOUNTER — Other Ambulatory Visit (HOSPITAL_COMMUNITY): Payer: Self-pay

## 2024-01-13 ENCOUNTER — Other Ambulatory Visit (HOSPITAL_COMMUNITY): Payer: Self-pay

## 2024-01-27 ENCOUNTER — Other Ambulatory Visit: Payer: Self-pay

## 2024-01-27 ENCOUNTER — Other Ambulatory Visit: Payer: Self-pay | Admitting: Family Medicine

## 2024-01-27 ENCOUNTER — Other Ambulatory Visit (HOSPITAL_COMMUNITY): Payer: Self-pay

## 2024-01-27 MED ORDER — IBUPROFEN 600 MG PO TABS
600.0000 mg | ORAL_TABLET | Freq: Three times a day (TID) | ORAL | 1 refills | Status: DC | PRN
  Filled 2024-01-27: qty 90, 30d supply, fill #0

## 2024-01-28 ENCOUNTER — Encounter: Payer: Self-pay | Admitting: Podiatry

## 2024-01-28 ENCOUNTER — Ambulatory Visit: Admitting: Podiatry

## 2024-01-28 ENCOUNTER — Ambulatory Visit

## 2024-01-28 ENCOUNTER — Other Ambulatory Visit (HOSPITAL_COMMUNITY): Payer: Self-pay

## 2024-01-28 DIAGNOSIS — M7672 Peroneal tendinitis, left leg: Secondary | ICD-10-CM

## 2024-01-28 MED ORDER — NAPROXEN SODIUM 220 MG PO TABS
220.0000 mg | ORAL_TABLET | Freq: Two times a day (BID) | ORAL | 0 refills | Status: DC | PRN
  Filled 2024-01-28: qty 50, 25d supply, fill #0

## 2024-01-28 MED ORDER — TRIAMCINOLONE ACETONIDE 10 MG/ML IJ SUSP
10.0000 mg | Freq: Once | INTRAMUSCULAR | Status: AC
  Administered 2024-01-28: 10 mg

## 2024-01-28 NOTE — Progress Notes (Signed)
 Chief Complaint  Patient presents with   Foot Pain    Rm17 left foot lateral foot pain started 4 months ago/ hurts with pressure and shoe gear/no treatment for relief.    HPI: 58 y.o. female presents today complaining of left lateral foot pain going on for about the last 4 months.  Gradual onset.  Reports worse pain with pressure, ambulation, shoe gear.  Denies any trauma to the area. She has seen sports medicine for this they did have her using a CAM boot previously.  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   Osteoarthritis    knees   PONV (postoperative nausea and vomiting)    SVD (spontaneous vaginal delivery)    x 1   Viral respiratory illness 11/02/2018    Past Surgical History:  Procedure Laterality Date   CHOLECYSTECTOMY  1987   CYSTOSCOPY  02/18/2016   Procedure: CYSTOSCOPY;  Surgeon: Lenord Radon, MD;  Location: [facility];  Service: Gynecology;;   HYSTEROSCOPY WITH D & C N/A 12/30/2015   Procedure: DILATATION AND CURETTAGE Lorraine Roses;  Surgeon: Verlyn Goad, MD;  Location: [facility];  Service: Gynecology;  Laterality: N/A;   KNEE SURGERY  2004   left knee- Dr. Abigail Abler   MICROLARYNGOSCOPY N/A 11/29/2017   Procedure: MICROLARYNGOSCOPY;  Surgeon: Mellody Sprout, MD;  Location: [facility] OR[facility];  Service: ENT;  Laterality: N/A;   ROBOTIC ASSISTED TOTAL HYSTERECTOMY  WITH SALPINGECTOMY N/A 02/18/2016   Procedure: ROBOTIC ASSISTED TOTAL HYSTERECTOMY WITH SALPINGECTOMY;  Surgeon: Lenord Radon, MD;  Location: [facility];  Service: Gynecology;  Laterality: N/A;   UPPER GI ENDOSCOPY     gerd    Allergies  Allergen Reactions   Atorvastatin  Other (See Comments)    Myalgias   Egg-Derived Products Nausea And Vomiting    Denies any issues with vaccines BUT does request the EGG-free vaccines since 2018   Ezetimibe  Other (See Comments)    Myalgias - could not get out of bed due to myalgia pain.    Hydrocodone -Acetaminophen  Nausea Only   Rosuvastatin  Other (See Comments)    Myalgias   Gabapentin  Rash    Denies airway involvement. Itchy rash    ROS    Physical Exam: There were no vitals filed for this visit.  General: The patient is alert and oriented x3 in no acute distress.  Dermatology: Skin is warm, dry and supple bilateral lower extremities. Interspaces are clear of maceration and debris.    Vascular: Palpable pedal pulses bilaterally. Capillary refill within normal limits.  Localized edema left midfoot.  No erythema or calor.  Neurological: Light touch sensation grossly intact bilateral feet.   Musculoskeletal Exam: Tenderness on palpation of left lower extremity peroneal tendons.  Pain with resisted eversion.  Tenderness  along palpation of peroneus longus around cuboid.  Tenderness on palpation of dorsal lateral cuboid left foot. Pes planus noted.  Radiographic Exam: Left foot 3 views weightbearing 01/28/2024 Joint spaces preserved.  No fractures or osseous irregularities noted.  Some generalized midfoot arthritis noted.  Accessory ossicles noted around cuboid.  Accessory ossicle noted adjacent to fifth metatarsal head.  Bipartite tibial sesamoid noted.  Pes planus foot type noted.  Calcaneal spurring present.  Assessment/Plan of Care: 1. Peroneal tendinitis of left lower leg      Meds ordered this encounter  Medications   naproxen  sodium  (ALL DAY RELIEF) 220 MG tablet    Sig: Take 1 tablet (220 mg total) by mouth 2 (two) times daily as needed for up to 14 days.    Dispense:  50 tablet    Refill:  0   triamcinolone  acetonide (KENALOG ) 10 MG/ML injection 10 mg   DG FOOT COMPLETE LEFT  Discussed clinical findings with patient today.  Radiographs reviewed with patient  Ongoing peroneal tendinitis suspected.  Chronic nature at this point just been going on for 4 months.  She denies any significant improvement from the time of onset.  Could be component of cuboid subluxation as palpation in this area was quite tender.  Will have patient return to cam boot over the next 2 weeks.  Home stretching and range of motion exercises discussed with patient with written instructions dispensed.  She can proceed with nonweightbearing range of motion and strengthening exercises at this time as well.  Prescribing course of home naproxen  twice a day.  Possibility of advanced imaging pending patient's response to advancing out of cam boot.  Procedure: Corticosteroid injection to left peroneal tendons. Verbal consent obtained to administer corticosteroid injection to left foot peroneal tendons at level of the midfoot.  Alcohol skin prep.  Injected 1 cc of a one-to-one ratio of 0.5% Marcaine  plain with 2% lidocaine  plain mixed with 10 mg Kenalog .  Band-Aid applied.  Patient tolerated this well.  Timera Windt L. Lunda Salines, AACFAS [facility]     [address] [place], [place] [address]                Office [phone]  Fax [phone]

## 2024-02-03 ENCOUNTER — Other Ambulatory Visit: Payer: Self-pay | Admitting: Family Medicine

## 2024-02-04 ENCOUNTER — Other Ambulatory Visit: Payer: Self-pay | Admitting: Family Medicine

## 2024-02-04 ENCOUNTER — Other Ambulatory Visit (HOSPITAL_COMMUNITY): Payer: Self-pay

## 2024-02-04 ENCOUNTER — Telehealth: Payer: Self-pay | Admitting: Family Medicine

## 2024-02-04 ENCOUNTER — Other Ambulatory Visit: Payer: Self-pay

## 2024-02-04 MED ORDER — OXYCODONE-ACETAMINOPHEN 10-325 MG PO TABS
1.0000 | ORAL_TABLET | Freq: Three times a day (TID) | ORAL | 0 refills | Status: DC | PRN
  Filled 2024-02-04: qty 15, 5d supply, fill #0

## 2024-02-04 NOTE — Telephone Encounter (Signed)
 refilled

## 2024-02-04 NOTE — Telephone Encounter (Signed)
 Pt calling for pain meds refill, requested by pharmacy 6/12 and wants filled today if possible.

## 2024-02-07 ENCOUNTER — Other Ambulatory Visit (HOSPITAL_COMMUNITY): Payer: Self-pay

## 2024-02-07 MED ORDER — AMLODIPINE BESYLATE 5 MG PO TABS
5.0000 mg | ORAL_TABLET | Freq: Every day | ORAL | 0 refills | Status: DC
  Filled 2024-02-07: qty 90, 90d supply, fill #0

## 2024-02-07 MED ORDER — LOSARTAN POTASSIUM 25 MG PO TABS
25.0000 mg | ORAL_TABLET | Freq: Every day | ORAL | 0 refills | Status: DC
  Filled 2024-02-07: qty 90, 90d supply, fill #0

## 2024-02-07 MED ORDER — PRAVASTATIN SODIUM 40 MG PO TABS
40.0000 mg | ORAL_TABLET | Freq: Every day | ORAL | 0 refills | Status: DC
Start: 2024-02-07 — End: 2024-04-27
  Filled 2024-02-07: qty 90, 90d supply, fill #0

## 2024-02-10 ENCOUNTER — Other Ambulatory Visit (HOSPITAL_COMMUNITY): Payer: Self-pay

## 2024-02-11 ENCOUNTER — Ambulatory Visit: Admitting: Podiatry

## 2024-02-23 ENCOUNTER — Encounter: Payer: Self-pay | Admitting: Family Medicine

## 2024-02-24 ENCOUNTER — Other Ambulatory Visit (HOSPITAL_COMMUNITY): Payer: Self-pay

## 2024-02-24 ENCOUNTER — Other Ambulatory Visit: Payer: Self-pay | Admitting: Family Medicine

## 2024-02-24 MED ORDER — OXYCODONE-ACETAMINOPHEN 10-325 MG PO TABS
1.0000 | ORAL_TABLET | Freq: Three times a day (TID) | ORAL | 0 refills | Status: DC | PRN
  Filled 2024-02-24: qty 15, 5d supply, fill #0

## 2024-02-24 NOTE — Telephone Encounter (Signed)
 Last OV 11/02/23 Next OV not scheduled  Last refill 02/04/24 Qty #15/0

## 2024-03-27 ENCOUNTER — Other Ambulatory Visit (HOSPITAL_COMMUNITY): Payer: Self-pay

## 2024-03-27 ENCOUNTER — Telehealth: Payer: Self-pay | Admitting: Family Medicine

## 2024-03-27 MED ORDER — OXYCODONE-ACETAMINOPHEN 10-325 MG PO TABS
1.0000 | ORAL_TABLET | Freq: Three times a day (TID) | ORAL | 0 refills | Status: DC | PRN
  Filled 2024-03-27: qty 30, 10d supply, fill #0

## 2024-03-27 NOTE — Telephone Encounter (Signed)
 Done

## 2024-03-27 NOTE — Telephone Encounter (Signed)
 Patient called asking if Dr Joane would be able to refill her oxyCODONE -acetaminophen  (PERCOCET) 10-325 MG tablet?  Please advise.

## 2024-04-11 ENCOUNTER — Encounter: Payer: Self-pay | Admitting: Family Medicine

## 2024-04-11 ENCOUNTER — Ambulatory Visit: Admitting: Family Medicine

## 2024-04-11 VITALS — BP 128/78 | HR 75 | Ht 66.0 in | Wt 264.0 lb

## 2024-04-11 DIAGNOSIS — S61219A Laceration without foreign body of unspecified finger without damage to nail, initial encounter: Secondary | ICD-10-CM

## 2024-04-11 DIAGNOSIS — Z0000 Encounter for general adult medical examination without abnormal findings: Secondary | ICD-10-CM | POA: Diagnosis not present

## 2024-04-11 DIAGNOSIS — E119 Type 2 diabetes mellitus without complications: Secondary | ICD-10-CM

## 2024-04-11 DIAGNOSIS — Z6841 Body Mass Index (BMI) 40.0 and over, adult: Secondary | ICD-10-CM | POA: Diagnosis not present

## 2024-04-11 DIAGNOSIS — I10 Essential (primary) hypertension: Secondary | ICD-10-CM

## 2024-04-11 DIAGNOSIS — E66813 Obesity, class 3: Secondary | ICD-10-CM | POA: Diagnosis not present

## 2024-04-11 DIAGNOSIS — R351 Nocturia: Secondary | ICD-10-CM

## 2024-04-11 LAB — POCT URINALYSIS DIP (MANUAL ENTRY)
Bilirubin, UA: NEGATIVE
Blood, UA: NEGATIVE
Glucose, UA: NEGATIVE mg/dL
Ketones, POC UA: NEGATIVE mg/dL
Leukocytes, UA: NEGATIVE
Nitrite, UA: NEGATIVE
Protein Ur, POC: NEGATIVE mg/dL
Spec Grav, UA: 1.025 (ref 1.010–1.025)
Urobilinogen, UA: 1 U/dL
pH, UA: 6 (ref 5.0–8.0)

## 2024-04-11 LAB — POCT GLYCOSYLATED HEMOGLOBIN (HGB A1C): HbA1c, POC (controlled diabetic range): 6.9 % (ref 0.0–7.0)

## 2024-04-11 NOTE — Progress Notes (Signed)
  Date of Visit: 04/11/2024   SUBJECTIVE:   HPI:  Barbara Reese presents today for a well woman exam.   Concerns today: *** Periods: *** Contraception: *** Pelvic symptoms: *** Sexual activity: *** STD Screening: *** Pap smear status: *** Exercise: *** Diet: *** Smoking: *** Alcohol: *** Drugs: *** Advance directives: *** Mood: *** Dentist: *** Cancers in family: ***  OBJECTIVE:   BP (!) 141/87   Pulse 75   Ht 5' 6 (1.676 m)   Wt 264 lb (119.7 kg)   LMP 02/09/2016 (Exact Date)   SpO2 95%   BMI 42.61 kg/m  Gen: NAD, pleasant, cooperative HEENT: NCAT, PERRL, no palpable thyromegaly or anterior cervical lymphadenopathy Heart: RRR, no murmurs Lungs: CTAB, NWOB Abdomen: soft, nontender to palpation Neuro: grossly nonfocal, speech normal   ASSESSMENT/PLAN:   Health maintenance:  -STD screening: *** -pap smear: *** -mammogram: *** -lipid screening: *** -colon cancer screening: *** -lung cancer screening: *** -DEXA: *** -immunizations:  Flu: *** Tdap: *** Shingrix : *** COVID: *** Pneumovax: *** -advance directives: *** -handout given on health maintenance topics  Assessment & Plan Routine adult health maintenance  Type 2 diabetes mellitus without complication, without long-term current use of insulin (HCC)   FOLLOW UP: Follow up in *** for ***  [place] J. Donah, MD [facility]

## 2024-04-11 NOTE — Patient Instructions (Signed)
 It was great to see you again today.  Keep finger clean Watch for drainage, bleeding, increased pain, turning colors  Call the [facility] to set up an appointment for weight management. Their number is [phone].   Checking urine and labs Go to [facility] to get your bloodwork drawn [address] [place], [place] [address] [phone]   Will do your FMLA paperwork when it comes to me  Send me immunization records from [facility]  Be well, Dr. Donah

## 2024-04-13 ENCOUNTER — Other Ambulatory Visit

## 2024-04-13 DIAGNOSIS — E119 Type 2 diabetes mellitus without complications: Secondary | ICD-10-CM | POA: Diagnosis not present

## 2024-04-13 DIAGNOSIS — R351 Nocturia: Secondary | ICD-10-CM | POA: Insufficient documentation

## 2024-04-13 NOTE — Assessment & Plan Note (Signed)
 Well controlled. Continue current medication regimen.

## 2024-04-13 NOTE — Assessment & Plan Note (Signed)
 Acceptable control with A1c 6.9 today, continue to monitor. Declines mounjaro, do not want to add SLGT-2 due to current polyuria

## 2024-04-13 NOTE — Assessment & Plan Note (Signed)
 Experiences frequent nocturia, possibly due to overactive bladder. No symptoms of infection otherwise.  - Check UACR, UA, urine osmolality

## 2024-04-13 NOTE — Assessment & Plan Note (Signed)
-   UACR done today -STD screening: declines -pap smear: hysterectomy -mammogram: UTD -lipid screening: current, on statin -colon cancer screening: UTD -lung cancer screening: n/a -DEXA: n/a -immunizations:  Flu: yearly at work Tdap: UTD Shingrix : declines Pneumovax: declines Hep B: she will send records from [facility] with her vaccine records -handout given on health maintenance topics

## 2024-04-13 NOTE — Assessment & Plan Note (Signed)
-   discussed possibility of adding mounjaro for diabetes and weight loss, prefers to avoid injections - Provided phone number for [facility] for consultation on weight management

## 2024-04-14 ENCOUNTER — Ambulatory Visit: Payer: Self-pay | Admitting: Family Medicine

## 2024-04-14 LAB — CMP14+EGFR
ALT: 14 IU/L (ref 0–32)
AST: 18 IU/L (ref 0–40)
Albumin: 4.6 g/dL (ref 3.8–4.9)
Alkaline Phosphatase: 63 IU/L (ref 44–121)
BUN/Creatinine Ratio: 11 (ref 9–23)
BUN: 9 mg/dL (ref 6–24)
Bilirubin Total: 0.6 mg/dL (ref 0.0–1.2)
CO2: 26 mmol/L (ref 20–29)
Calcium: 10.1 mg/dL (ref 8.7–10.2)
Chloride: 101 mmol/L (ref 96–106)
Creatinine, Ser: 0.84 mg/dL (ref 0.57–1.00)
Globulin, Total: 3.1 g/dL (ref 1.5–4.5)
Glucose: 97 mg/dL (ref 70–99)
Potassium: 4.4 mmol/L (ref 3.5–5.2)
Sodium: 141 mmol/L (ref 134–144)
Total Protein: 7.7 g/dL (ref 6.0–8.5)
eGFR: 80 mL/min/1.73 (ref >59)

## 2024-04-14 LAB — LIPID PANEL
Chol/HDL Ratio: 2.7 ratio (ref 0.0–4.4)
Cholesterol, Total: 189 mg/dL (ref 100–199)
HDL: 69 mg/dL (ref >39)
LDL Chol Calc (NIH): 99 mg/dL (ref 0–99)
Triglycerides: 117 mg/dL (ref 0–149)
VLDL Cholesterol Cal: 21 mg/dL (ref 5–40)

## 2024-04-14 LAB — MICROALBUMIN / CREATININE URINE RATIO
Creatinine, Urine: 114.6 mg/dL
Microalb/Creat Ratio: 5 mg/g{creat} (ref 0–29)
Microalbumin, Urine: 5.4 ug/mL

## 2024-04-15 LAB — OSMOLALITY, URINE: Osmolality, Ur: 617 mosm/kg

## 2024-04-17 ENCOUNTER — Telehealth: Payer: Self-pay | Admitting: Family Medicine

## 2024-04-17 NOTE — Telephone Encounter (Signed)
 FMLA paperwork completed, will return to RN team Barbara JINNY Legions, MD

## 2024-04-17 NOTE — Telephone Encounter (Signed)
 Form placed up front for pick up.   Copy made for batch scanning.   Attempted to call patient, however no answer.   Form is ready up front if/when she calls back.

## 2024-04-25 ENCOUNTER — Telehealth: Payer: Self-pay | Admitting: Family Medicine

## 2024-04-25 NOTE — Telephone Encounter (Signed)
 Placed in MDs box to be filled out. Charee Tumblin [place], CMA

## 2024-04-25 NOTE — Telephone Encounter (Signed)
 Patient dropped off DMV placard form to be completed. Last DOS was 04/11/24. Placed in [employer].

## 2024-04-27 ENCOUNTER — Other Ambulatory Visit (HOSPITAL_COMMUNITY): Payer: Self-pay

## 2024-04-27 ENCOUNTER — Other Ambulatory Visit: Payer: Self-pay | Admitting: Family Medicine

## 2024-04-27 MED ORDER — OXYCODONE-ACETAMINOPHEN 10-325 MG PO TABS
1.0000 | ORAL_TABLET | Freq: Three times a day (TID) | ORAL | 0 refills | Status: DC | PRN
  Filled 2024-04-27: qty 30, 10d supply, fill #0

## 2024-04-27 NOTE — Telephone Encounter (Signed)
 Last OV 11/02/23 Next OV not scheduled  Last refill 03/27/24 Qty #30/0  Controlled substance, forwarding to Dr. Joane.

## 2024-04-28 ENCOUNTER — Ambulatory Visit (INDEPENDENT_AMBULATORY_CARE_PROVIDER_SITE_OTHER): Admitting: Podiatry

## 2024-04-28 ENCOUNTER — Encounter: Payer: Self-pay | Admitting: Podiatry

## 2024-04-28 DIAGNOSIS — M25572 Pain in left ankle and joints of left foot: Secondary | ICD-10-CM

## 2024-04-28 DIAGNOSIS — M7672 Peroneal tendinitis, left leg: Secondary | ICD-10-CM | POA: Diagnosis not present

## 2024-04-28 MED ORDER — TRIAMCINOLONE ACETONIDE 10 MG/ML IJ SUSP: 10.0000 mg | Freq: Once | INTRAMUSCULAR | Status: AC

## 2024-04-28 NOTE — Progress Notes (Signed)
 Chief Complaint  Patient presents with   Foot Pain    Pt having constant sharp pian in L lateral fore and midfoot. Injection lasted 3 weeks.  Has found nothing else to help.  Pre Diabetic A1c 6.9.  no anti coag    HPI: 58 y.o. female presents today complaining of left lateral foot and ankle pain last seen in June, had peroneal tendon injection at that time which did alleviate the pain for a couple of weeks and the pain recurred.  She did miss her follow-up.  States that the pain  is as bad as it was initially and is now chronic at this point in time.  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   Osteoarthritis    knees   PONV (postoperative nausea and vomiting)    SVD (spontaneous vaginal delivery)    x 1   Viral respiratory illness 11/02/2018    Past Surgical History:  Procedure Laterality Date   CHOLECYSTECTOMY  1987   CYSTOSCOPY  02/18/2016   Procedure: CYSTOSCOPY;  Surgeon: Elveria Mungo, MD;  Location: [facility];  Service: Gynecology;;   HYSTEROSCOPY WITH D & C N/A 12/30/2015   Procedure: DILATATION AND CURETTAGE LELDON;  Surgeon: Winton Felt, MD;  Location: [facility];  Service: Gynecology;  Laterality: N/A;   KNEE SURGERY  2004   left knee- Dr. Beverley   MICROLARYNGOSCOPY N/A 11/29/2017   Procedure: MICROLARYNGOSCOPY;  Surgeon: Edda Mt, MD;  Location:  [facility];  Service: ENT;  Laterality: N/A;   ROBOTIC ASSISTED TOTAL HYSTERECTOMY WITH SALPINGECTOMY N/A 02/18/2016   Procedure: ROBOTIC ASSISTED TOTAL HYSTERECTOMY WITH SALPINGECTOMY;  Surgeon: Elveria Mungo, MD;  Location: [facility];  Service: Gynecology;  Laterality: N/A;   UPPER GI ENDOSCOPY     gerd    Allergies  Allergen Reactions   Atorvastatin  Other (See Comments)    Myalgias   Egg-Derived Products Nausea And Vomiting    Denies any issues with vaccines BUT does request the EGG-free vaccines since 2018   Ezetimibe  Other (See Comments)    Myalgias - could not get out of bed due to myalgia pain.    Hydrocodone -Acetaminophen  Nausea Only   Rosuvastatin  Other (See Comments)    Myalgias   Gabapentin  Rash    Denies airway involvement. Itchy rash    ROS    Physical Exam: There were no vitals filed for this visit.  General: The patient is alert and oriented x3 in no acute distress.  Dermatology: Skin is warm, dry and supple bilateral lower extremities. Interspaces are clear of maceration and debris.    Vascular: Palpable pedal pulses bilaterally. Capillary refill within normal limits.  Localized edema left midfoot.  No erythema or calor.  Neurological: Light touch sensation  grossly intact bilateral feet.   Musculoskeletal Exam: Tenderness on palpation of left lower extremity peroneal tendons.  Pain with resisted eversion.  Tenderness along palpation of peroneus longus around cuboid.  Tenderness on palpation of dorsal lateral cuboid left foot. Pes planus noted.  Some tenderness on palpation of the left sinus tarsi as well.  Radiographic Exam: Left foot 3 views weightbearing 01/28/2024 Joint spaces preserved.  No fractures or osseous irregularities noted.  Some generalized midfoot arthritis noted.  Accessory ossicles noted around cuboid.  Accessory ossicle noted adjacent to fifth metatarsal head.  Bipartite tibial sesamoid noted.  Pes planus foot type noted.  Calcaneal spurring  present.  Assessment/Plan of Care: 1. 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  Ongoing peroneal tendinitis suspected.  Did discuss some possibility of sinus tarsi syndrome or cuboid pain as well.  Primary area of pain is around the peroneal tendons of the left ankle.  Today dispensed and fitted ASO lace up ankle brace to the left ankle.  Encouraged use of this is much as possible when weightbearing along with use of good supportive shoes/appropriate shoe gear.  Home peroneal  tendinopathy rehab exercises discussed with patient with written instructions dispensed.  Utilize RICE therapy.  Discussed need for close follow-up to assess progression.  We did discuss possibility of formal physical therapy referral, possible sinus tarsi injection if this area is still painful and the peroneal tendon pain is improved. May need MRI if no improvement given chronic nature of symptoms greater than 6 months.  Procedure: Corticosteroid injection to left peroneal tendons. Verbal consent obtained to administer corticosteroid injection to left foot peroneal tendons at level of the midfoot.  Alcohol skin prep.  Injected 1 cc of a one-to-one ratio of 0.5% Marcaine  plain with 2% lidocaine  plain mixed with 10 mg Kenalog .  Band-Aid applied.  Patient tolerated this well.  Reevaluate in approximately 2 weeks.  Beckham Capistran L. Lamount MAUL, AACFAS [facility]     [address] [place], [place] [address]                Office [phone]  Fax [phone]

## 2024-05-01 ENCOUNTER — Other Ambulatory Visit: Payer: Self-pay

## 2024-05-01 ENCOUNTER — Other Ambulatory Visit (HOSPITAL_COMMUNITY): Payer: Self-pay

## 2024-05-01 MED ORDER — PRAVASTATIN SODIUM 40 MG PO TABS
40.0000 mg | ORAL_TABLET | Freq: Every day | ORAL | 3 refills | Status: AC
  Filled 2024-05-01: qty 90, 90d supply, fill #0
  Filled 2024-07-27: qty 90, 90d supply, fill #1

## 2024-05-01 MED ORDER — AMLODIPINE BESYLATE 5 MG PO TABS
5.0000 mg | ORAL_TABLET | Freq: Every day | ORAL | 3 refills | Status: AC
  Filled 2024-05-01: qty 90, 90d supply, fill #0
  Filled 2024-07-27: qty 90, 90d supply, fill #1

## 2024-05-01 MED ORDER — LOSARTAN POTASSIUM 25 MG PO TABS
25.0000 mg | ORAL_TABLET | Freq: Every day | ORAL | 3 refills | Status: AC
  Filled 2024-05-01: qty 90, 90d supply, fill #0
  Filled 2024-07-27: qty 90, 90d supply, fill #1

## 2024-05-01 NOTE — Telephone Encounter (Signed)
Forms completed, will return to [facility] RN team. [name], MD

## 2024-05-02 NOTE — Telephone Encounter (Signed)
 Patient called and informed that forms are ready for pick up. Copy made and placed in batch scanning. Original placed at front desk for pick up.   Veronda Prude, RN

## 2024-05-08 ENCOUNTER — Other Ambulatory Visit (HOSPITAL_COMMUNITY): Payer: Self-pay

## 2024-05-15 ENCOUNTER — Telehealth: Payer: Self-pay | Admitting: Family Medicine

## 2024-05-15 NOTE — Telephone Encounter (Signed)
 Patient called asking about the status of the physician's statement that was faxed over on 05/09/24. States she needs it sent back in by Wednesday is possible.

## 2024-05-16 NOTE — Telephone Encounter (Signed)
 Patient called again checking status of forms.   Please advise.   Thanks!

## 2024-05-17 NOTE — Telephone Encounter (Signed)
 Called to speak with patient about forms, to confirm this is regarding her finger laceration, which it was She did not miss work for this laceration but does have accident insurance and is filing a claim Forms completed, will return to [facility] RN team. Laymon JINNY Legions, MD

## 2024-05-18 NOTE — Telephone Encounter (Signed)
 Faxed paperwork to [employer] at provided number. Copy made and placed in batch scanning.   Patient called and advised of update.  Chiquita JAYSON English, RN

## 2024-05-19 ENCOUNTER — Ambulatory Visit: Admitting: Podiatry

## 2024-05-26 ENCOUNTER — Other Ambulatory Visit: Payer: Self-pay | Admitting: Family Medicine

## 2024-05-26 ENCOUNTER — Telehealth: Payer: Self-pay | Admitting: Family Medicine

## 2024-05-26 ENCOUNTER — Other Ambulatory Visit (HOSPITAL_COMMUNITY): Payer: Self-pay

## 2024-05-26 MED ORDER — OXYCODONE-ACETAMINOPHEN 10-325 MG PO TABS
1.0000 | ORAL_TABLET | Freq: Three times a day (TID) | ORAL | 0 refills | Status: DC | PRN
  Filled 2024-05-26: qty 30, 10d supply, fill #0

## 2024-05-26 NOTE — Telephone Encounter (Signed)
 Patient called to ask if we had received a request for a refill of her percocet. Please advise.

## 2024-05-29 NOTE — Telephone Encounter (Signed)
 It looks like Dr. Joane refilled 05/26/24.   Summary: Take 1 tablet by mouth every 8 (eight) hours as needed for pain., Starting Fri 05/26/2024, Normal Dose, Route, Frequency: 1 tablet, Oral, Every 8 hours PRNDx Associated: End Date Warning: Different Encounter: Start Date & Time: 10/03/2025Ord/Sold: 05/26/2024 (O)Ordered On: 10/03/2025ReportLast Dispense: 10/3/2025Pharmacy: [place] [facility] [place] [employer] [facility]

## 2024-06-19 ENCOUNTER — Telehealth: Payer: Self-pay | Admitting: Family Medicine

## 2024-06-19 NOTE — Telephone Encounter (Signed)
 Patient called and asked if Dr. Joane would refill her pain medicine. Please advise.

## 2024-06-20 ENCOUNTER — Other Ambulatory Visit (HOSPITAL_COMMUNITY): Payer: Self-pay

## 2024-06-20 MED ORDER — OXYCODONE-ACETAMINOPHEN 10-325 MG PO TABS
1.0000 | ORAL_TABLET | Freq: Three times a day (TID) | ORAL | 0 refills | Status: DC | PRN
  Filled 2024-06-20: qty 30, 10d supply, fill #0

## 2024-06-20 NOTE — Telephone Encounter (Signed)
 Medication refilled

## 2024-06-23 ENCOUNTER — Ambulatory Visit: Admitting: Podiatry

## 2024-06-26 ENCOUNTER — Encounter: Payer: Self-pay | Admitting: Radiology

## 2024-07-12 ENCOUNTER — Other Ambulatory Visit (HOSPITAL_COMMUNITY): Payer: Self-pay

## 2024-07-12 ENCOUNTER — Other Ambulatory Visit: Payer: Self-pay | Admitting: Family Medicine

## 2024-07-12 DIAGNOSIS — R21 Rash and other nonspecific skin eruption: Secondary | ICD-10-CM

## 2024-07-12 MED ORDER — CLOBETASOL PROPIONATE 0.05 % EX CREA
1.0000 | TOPICAL_CREAM | Freq: Every day | CUTANEOUS | 0 refills | Status: AC | PRN
  Filled 2024-07-12 – 2024-07-27 (×2): qty 45, 90d supply, fill #0

## 2024-07-24 ENCOUNTER — Other Ambulatory Visit (HOSPITAL_COMMUNITY): Payer: Self-pay

## 2024-07-25 ENCOUNTER — Other Ambulatory Visit (HOSPITAL_COMMUNITY): Payer: Self-pay

## 2024-07-27 ENCOUNTER — Other Ambulatory Visit (HOSPITAL_COMMUNITY): Payer: Self-pay

## 2024-07-27 ENCOUNTER — Other Ambulatory Visit: Payer: Self-pay | Admitting: Family Medicine

## 2024-07-28 ENCOUNTER — Other Ambulatory Visit (HOSPITAL_COMMUNITY): Payer: Self-pay

## 2024-07-28 ENCOUNTER — Telehealth: Payer: Self-pay

## 2024-07-28 ENCOUNTER — Telehealth: Admitting: Family Medicine

## 2024-07-28 DIAGNOSIS — F4320 Adjustment disorder, unspecified: Secondary | ICD-10-CM | POA: Diagnosis not present

## 2024-07-28 MED ORDER — PANTOPRAZOLE SODIUM 40 MG PO TBEC
40.0000 mg | DELAYED_RELEASE_TABLET | Freq: Every day | ORAL | 3 refills | Status: AC
  Filled 2024-07-28: qty 90, 90d supply, fill #0

## 2024-07-28 MED ORDER — HYDROXYZINE HCL 10 MG PO TABS
10.0000 mg | ORAL_TABLET | Freq: Three times a day (TID) | ORAL | 0 refills | Status: AC | PRN
  Filled 2024-07-28: qty 30, 10d supply, fill #0

## 2024-07-28 NOTE — Telephone Encounter (Signed)
 Patient calls nurse line requesting prescription for anxiety.   She reports that her father passed away yesterday and is requesting something to help get her through this time.   Spoke with Dr. Donzetta. Recommended virtual visit for medication and medical history review.   Scheduled with Dr. Donzetta this morning for virtual.   Chiquita JAYSON English, RN

## 2024-07-28 NOTE — Progress Notes (Signed)
 [facility] Telemedicine Visit  Patient consented to have virtual visit and was identified by name and date of birth. Method of visit: Video  Encounter participants: Patient: Barbara Reese - located at home Provider: Rollene FORBES Keeling - located at [facility] Others (if applicable): n/a  Chief Complaint: grief  HPI:  Father who was in hospice with ESRD passed away on 08/26/24. Also lost her dog of 15 years in August. Having lots of grief. Feeling overwhelmed. Trouble sleeping, anxious thoughts. Tried a tylenol  PM. Also tried an Ativan  left from her fathers hospice meds, not sure if it helped. Wanting something for sleep and anxiety. Has tried hydroxyzine  in past for itching, made her very sleepy so put it away. Checked her medicine cabinet and does not still have it.   ROS: per HPI  Pertinent PMHx: T2DM, ODA,   Exam:  LMP 02/09/2016 (Exact Date)   Overall: NAD, alert and oriented Respiratory: speaking in complete sentences, normal WOB Psych: flat affect, denies SI  Assessment/Plan:  Assessment & Plan Grief reaction Explained normal grief reaction, provided supportive listening Denies SI, has good family support PRN hydroxyzine  10mg  TID PRN, discussed sedating effects and could cut in half and to try first at night and not to try before driving until she sees if it is sedating for her    Time spent during visit with patient: 11 minutes

## 2024-07-31 ENCOUNTER — Other Ambulatory Visit: Payer: Self-pay | Admitting: Family Medicine

## 2024-07-31 ENCOUNTER — Other Ambulatory Visit (HOSPITAL_COMMUNITY): Payer: Self-pay

## 2024-07-31 ENCOUNTER — Encounter: Payer: Self-pay | Admitting: Family Medicine

## 2024-07-31 MED ORDER — OXYCODONE-ACETAMINOPHEN 10-325 MG PO TABS
1.0000 | ORAL_TABLET | Freq: Three times a day (TID) | ORAL | 0 refills | Status: DC | PRN
  Filled 2024-07-31: qty 30, 10d supply, fill #0

## 2024-07-31 NOTE — Telephone Encounter (Signed)
 Last OV 11/02/23 Next OV not scheduled  Last refill 06/20/24 Qty #30/0

## 2024-07-31 NOTE — Telephone Encounter (Signed)
Patient called to follow up on this refill request

## 2024-07-31 NOTE — Telephone Encounter (Signed)
 Forwarding to Dr. Joane to review / refill.

## 2024-08-01 ENCOUNTER — Other Ambulatory Visit: Payer: Self-pay

## 2024-08-01 ENCOUNTER — Other Ambulatory Visit (HOSPITAL_COMMUNITY): Payer: Self-pay

## 2024-08-02 ENCOUNTER — Other Ambulatory Visit (HOSPITAL_COMMUNITY): Payer: Self-pay

## 2024-08-02 ENCOUNTER — Encounter: Payer: Self-pay | Admitting: Family Medicine

## 2024-08-02 ENCOUNTER — Telehealth: Payer: Self-pay | Admitting: Family Medicine

## 2024-08-02 MED ORDER — ALPRAZOLAM 0.25 MG PO TABS
0.2500 mg | ORAL_TABLET | Freq: Every day | ORAL | 0 refills | Status: AC | PRN
  Filled 2024-08-02: qty 10, 10d supply, fill #0

## 2024-08-02 NOTE — Telephone Encounter (Signed)
 Forms completed, will return to [facility] RN team, see phone encounter Laymon JINNY Legions, MD

## 2024-08-02 NOTE — Telephone Encounter (Signed)
Forms completed, will return to [facility] RN team. [name], MD

## 2024-08-02 NOTE — Telephone Encounter (Signed)
 Called patient to discuss anxiety and grief She is having a hard time, father's funeral service is this weekend She is having to support her mother through it Hydroxyzine  was too sedating for her to tolerate Does not take tizanidine  Takes oxycodone  rarely and only if knee pain is severe Advised I am ok sending in a short course of short acting low dose benzodiazpine to help her get through this acute grief period. Counseled not to take with other sedating medications. Counseled on risk of sedation with benzo. Advised this medication is habit forming and potentially addictive so we will not rx it long term, patient understood and in agreement. Rx sent in for alprazolam  0.25mg  daily as needed, #10.   Laymon JINNY Legions, MD

## 2024-08-02 NOTE — Telephone Encounter (Signed)
 Patient called checking on the status of her FMLA paperwork that was faxed on 12/8. Did inform patient of our 5 day policy for paperwork, she asks if someone can please let her know when it gets faxed back.

## 2024-08-03 ENCOUNTER — Other Ambulatory Visit (HOSPITAL_COMMUNITY): Payer: Self-pay

## 2024-08-03 NOTE — Telephone Encounter (Signed)
 Forms faxed to [employer].   Copy made and placed in batch scanning.  Chiquita JAYSON English, RN

## 2024-08-09 ENCOUNTER — Other Ambulatory Visit (HOSPITAL_COMMUNITY): Payer: Self-pay

## 2024-08-15 ENCOUNTER — Other Ambulatory Visit: Payer: Self-pay

## 2024-08-15 ENCOUNTER — Emergency Department (HOSPITAL_BASED_OUTPATIENT_CLINIC_OR_DEPARTMENT_OTHER)
Admission: EM | Admit: 2024-08-15 | Discharge: 2024-08-15 | Disposition: A | Discharge status: Home / Self Care | Attending: Emergency Medicine | Admitting: Emergency Medicine

## 2024-08-15 ENCOUNTER — Ambulatory Visit: Admitting: Family Medicine

## 2024-08-15 ENCOUNTER — Other Ambulatory Visit (HOSPITAL_BASED_OUTPATIENT_CLINIC_OR_DEPARTMENT_OTHER): Payer: Self-pay

## 2024-08-15 ENCOUNTER — Encounter (HOSPITAL_BASED_OUTPATIENT_CLINIC_OR_DEPARTMENT_OTHER): Payer: Self-pay

## 2024-08-15 ENCOUNTER — Emergency Department (HOSPITAL_BASED_OUTPATIENT_CLINIC_OR_DEPARTMENT_OTHER): Admitting: Radiology

## 2024-08-15 DIAGNOSIS — M25511 Pain in right shoulder: Secondary | ICD-10-CM | POA: Diagnosis not present

## 2024-08-15 MED ORDER — LIDOCAINE 5 % EX PTCH
1.0000 | MEDICATED_PATCH | CUTANEOUS | Status: DC
  Administered 2024-08-15: 1 via TRANSDERMAL
  Filled 2024-08-15: qty 1

## 2024-08-15 MED ORDER — METHOCARBAMOL 500 MG PO TABS
500.0000 mg | ORAL_TABLET | Freq: Two times a day (BID) | ORAL | 0 refills | Status: AC
  Filled 2024-08-15: qty 20, 10d supply, fill #0

## 2024-08-15 MED ORDER — METHYLPREDNISOLONE 4 MG PO TBPK
ORAL_TABLET | ORAL | 0 refills | Status: AC
  Filled 2024-08-15: qty 21, 5d supply, fill #0

## 2024-08-15 MED ORDER — PREDNISONE 50 MG PO TABS
60.0000 mg | ORAL_TABLET | Freq: Once | ORAL | Status: AC
  Administered 2024-08-15: 60 mg via ORAL
  Filled 2024-08-15: qty 1

## 2024-08-15 MED ORDER — HYDROMORPHONE HCL 1 MG/ML IJ SOLN
0.5000 mg | Freq: Once | INTRAMUSCULAR | Status: AC
  Administered 2024-08-15: 0.5 mg via INTRAMUSCULAR
  Filled 2024-08-15: qty 1

## 2024-08-15 NOTE — Progress Notes (Deleted)
"  °  Date of Visit: 08/15/2024   SUBJECTIVE:   HPI:  Discussed the use of AI scribe software for clinical note transcription with the patient, who gave verbal consent to proceed.  History of Present Illness       OBJECTIVE:   LMP 02/09/2016  Gen: *** HEENT: *** Heart: *** Lungs: *** Neuro: *** Ext: ***  ASSESSMENT/PLAN:   Assessment & Plan      Assessment and Plan Assessment & Plan       FOLLOW UP: Follow up in *** for ***  Atleigh Gruen J. Donah, MD [facility] "

## 2024-08-15 NOTE — Discharge Instructions (Signed)
 It was a pleasure taking care of you here today.  I suspect you likely have bursitis which is inflammation of the joint in her shoulder.  However you for a muscle relaxer and a steroid pack.  You may also get over-the-counter lidocaine  patches.  Continue taking your home pain medicine.  Follow-up with your orthopedist office  Return for new or worsening symptoms

## 2024-08-15 NOTE — ED Triage Notes (Signed)
 Pt c/o R shoulder pain x2 wks, worse on ROM. Denies trauma/injury. States pain feels similar to torn rotator cuff that she had on the L side.

## 2024-08-15 NOTE — ED Notes (Signed)
 Provided with water  upon request and provider approval. Visitor at bedside.

## 2024-08-15 NOTE — ED Provider Notes (Signed)
 " [facility] Provider Note   CSN: [id number] Arrival date & time: 08/15/24  1418     Patient presents with: Shoulder Pain   Barbara Reese is a 58 y.o. female here for right shoulder pain.  Pain started about a month ago, gradually worsened.  She follows with Dr. 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  She denies any recent trauma however states her father was recently under hospice and she was taking care of him and doing a lot of repetitive motions.  She also works at [employer] and does a lot of reaching forward.  Pain primarily occurs with movement.  No numbness or weakness.  No swelling, redness. RHD.  She takes oxycodone  chronically at baseline.   HPI     Prior to Admission medications  Medication Sig Start Date End Date Taking? Authorizing Provider  methocarbamol  (ROBAXIN ) 500 MG tablet Take 1 tablet (500 mg total) by mouth 2 (two) times daily. 08/15/24  Yes Tannis Burstein A, PA-C  methylPREDNISolone  (MEDROL  DOSEPAK) 4 MG TBPK tablet Take as prescribed on the box 08/15/24  Yes Keyden Pavlov A, PA-C  ALPRAZolam  (XANAX ) 0.25 MG tablet Take 1 tablet (0.25 mg total) by mouth daily as needed for anxiety. 08/02/24   Donah Laymon PARAS, MD  amLODipine  (NORVASC ) 5 MG tablet Take 1 tablet (5 mg total) by mouth at bedtime. 05/01/24   Donah Laymon PARAS, MD  cetirizine  (ZYRTEC ) 10 MG tablet Take 10 mg by mouth daily as needed for allergies.    [provider]  clobetasol  cream (TEMOVATE ) 0.05 % Apply 1 Application topically daily as needed. 07/12/24   Pruett, Milda CROME, MD  fluticasone  (FLONASE ) 50 MCG/ACT nasal spray Place 1 spray into both nostrils daily. 09/14/23   Donah Laymon PARAS, MD  hydrOXYzine  (ATARAX ) 10 MG tablet Take 1 tablet (10 mg total) by mouth 3 (three) times daily as needed. 07/28/24   Donzetta Rollene BRAVO, MD  losartan  (COZAAR ) 25 MG tablet Take 1 tablet (25 mg total) by mouth daily. 05/01/24    Donah Laymon PARAS, MD  oxyCODONE -acetaminophen  (PERCOCET) 10-325 MG tablet Take 1 tablet by mouth every 8 (eight) hours as needed for pain. 07/31/24   Corey, Evan S, MD  pantoprazole  (PROTONIX ) 40 MG tablet Take 1 tablet (40 mg total) by mouth daily. 07/28/24   Donzetta Rollene BRAVO, MD  pravastatin  (PRAVACHOL ) 40 MG tablet Take 1 tablet (40 mg total) by mouth at bedtime. 05/01/24   Donah Laymon PARAS, MD  tiZANidine  (ZANAFLEX ) 4 MG tablet Take 1 tablet (4 mg total) by mouth every 8 (eight) hours as needed. Patient not taking: Reported on 07/28/2024 11/02/23   Joane Artist RAMAN, MD    Allergies: Atorvastatin , Egg protein-containing drug products, Ezetimibe , Hydrocodone -acetaminophen , Rosuvastatin , and Gabapentin     Review of Systems  Constitutional: Negative.   HENT: Negative.    Respiratory: Negative.    Cardiovascular: Negative.   Gastrointestinal: Negative.   Genitourinary: Negative.   Musculoskeletal:  Negative for neck pain and neck stiffness.       Right Shoulder pain  Skin: Negative.   Neurological: Negative.   All other systems reviewed and are negative.   Updated Vital Signs BP (!) 142/89   Pulse 71   Temp 98.5 F (36.9 C) (Oral)   Resp 18   Ht 5' 6 (1.676 m)   Wt 117.9 kg   LMP 02/09/2016   SpO2 100%   BMI 41.97 kg/m   Physical Exam Vitals  and nursing note reviewed.  Constitutional:      General: She is not in acute distress.    Appearance: She is well-developed. She is not ill-appearing, toxic-appearing or diaphoretic.  HENT:     Head: Atraumatic.  Eyes:     Pupils: Pupils are equal, round, and reactive to light.  Neck:     Trachea: Trachea and phonation normal.     Comments: No midline tenderness, full range of motion, no rigidity Cardiovascular:     Rate and Rhythm: Normal rate.     Pulses:          Radial pulses are 2+ on the right side and 2+ on the left side.  Pulmonary:     Effort: No respiratory distress.  Abdominal:     General: There is no  distension.  Musculoskeletal:        General: Normal range of motion.     Cervical back: Full passive range of motion without pain and normal range of motion.     Comments: No midline spinal tenderness.  No bony tenderness to shoulder, humerus, forearm.  Able to range shoulder overhead.  No overlying skin changes.  No obvious joint effusion  Skin:    General: Skin is warm and dry.     Capillary Refill: Capillary refill takes less than 2 seconds.     Comments: No edema, erythema, warmth, fluctuance induration  Neurological:     General: No focal deficit present.     Mental Status: She is alert.     Cranial Nerves: Cranial nerves 2-12 are intact.     Sensory: Sensation is intact.     Motor: Motor function is intact.  Psychiatric:        Mood and Affect: Mood normal.    (all labs ordered are listed, but only abnormal results are displayed) Labs Reviewed - No data to display  EKG: None  Radiology: DG Shoulder Right Result Date: 08/15/2024 CLINICAL DATA:  Right shoulder pain for 2 weeks. EXAM: RIGHT SHOULDER - 2+ VIEW COMPARISON:  05/21/2020 FINDINGS: There is no evidence of fracture or dislocation. Moderate acromioclavicular degenerative spurring. Mild acromioclavicular degenerative spurring. No erosive or bony destructive change. Soft tissues are unremarkable. IMPRESSION: Moderate acromioclavicular and mild glenohumeral degenerative change. Electronically Signed   By: Andrea Gasman M.D.   On: 08/15/2024 17:38     Procedures   Medications Ordered in the ED  lidocaine  (LIDODERM ) 5 % 1 patch (1 patch Transdermal Patch Applied 08/15/24 1600)  predniSONE  (DELTASONE ) tablet 60 mg (60 mg Oral Given 08/15/24 1604)  HYDROmorphone  (DILAUDID ) injection 0.5 mg (0.5 mg Intramuscular Given 08/15/24 6762)   58 year old here for evaluation of right shoulder pain started about a month ago.  Does do repetitive motions for work as well as recently taking care of her father who is on hospice.   Pain at right trapezius into right shoulder.  Worse with movement.  She follows with Dr. 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  Here she is neurovascularly intact.  No joint effusion.  No obvious edema, erythema, warmth to suggest septic joint.  Low suspicion for gout.  No trauma to suggest fracture, dislocation.  Low suspicion for VTE, ischemia, bacterial infectious process, necrotizing infection.  Suspect likely bursitis versus rotator cuff etiology.  Will plan on x-ray, pain management.  Imaging personally viewed interpreted X-ray shows arthritis, no joint effusion, fracture, dislocation  Discussed results with patient.  Will treat Medrol  Dosepak.  She is  on chronic opiate pain medicine at baseline.  Will add onto muscle relaxer.  Will have her follow-up with her orthopedist.  She will return for new or worsening symptoms.  The patient has been appropriately medically screened and/or stabilized in the ED. I have low suspicion for any other emergent medical condition which would require further screening, evaluation or treatment in the ED or require inpatient management.  Patient is hemodynamically stable and in no acute distress.  Patient able to ambulate in department prior to ED.  Evaluation does not show acute pathology that would require ongoing or additional emergent interventions while in the emergency department or further inpatient treatment.  I have discussed the diagnosis with the patient and answered all questions.  Pain is been managed while in the emergency department and patient has no further complaints prior to discharge.  Patient is comfortable with plan discussed in room and is stable for discharge at this time.  I have discussed strict return precautions for returning to the emergency department.  Patient was encouraged to follow-up with PCP/specialist refer to at discharge.                                    Medical Decision Making Amount  and/or Complexity of Data Reviewed Independent Historian: parent External Data Reviewed: labs, radiology and notes. Radiology: ordered and independent interpretation performed. Decision-making details documented in ED Course.  Risk OTC drugs. Prescription drug management. Parenteral controlled substances. Decision regarding hospitalization. Diagnosis or treatment significantly limited by social determinants of health.        Final diagnoses:  Acute pain of right shoulder    ED Discharge Orders          Ordered    methocarbamol  (ROBAXIN ) 500 MG tablet  2 times daily        08/15/24 1523    methylPREDNISolone  (MEDROL  DOSEPAK) 4 MG TBPK tablet        08/15/24 1523               Tianni Escamilla A, PA-C 08/15/24 2258    Pamella Ozell LABOR, DO 08/20/24 1950  "

## 2024-08-25 ENCOUNTER — Other Ambulatory Visit: Payer: Self-pay | Admitting: Family Medicine

## 2024-08-25 DIAGNOSIS — Z1231 Encounter for screening mammogram for malignant neoplasm of breast: Secondary | ICD-10-CM

## 2024-08-28 ENCOUNTER — Other Ambulatory Visit (HOSPITAL_COMMUNITY): Payer: Self-pay

## 2024-08-28 ENCOUNTER — Telehealth: Payer: Self-pay | Admitting: Family Medicine

## 2024-08-28 NOTE — Telephone Encounter (Signed)
 Patient called requesting a refill on: oxyCODONE -acetaminophen  (PERCOCET) 10-325 MG tablet   Pharmacy: [facility] - [address]

## 2024-08-29 ENCOUNTER — Other Ambulatory Visit (HOSPITAL_COMMUNITY): Payer: Self-pay

## 2024-08-29 ENCOUNTER — Ambulatory Visit: Admitting: Family Medicine

## 2024-08-29 MED ORDER — OXYCODONE-ACETAMINOPHEN 10-325 MG PO TABS
1.0000 | ORAL_TABLET | Freq: Three times a day (TID) | ORAL | 0 refills | Status: AC | PRN
  Filled 2024-08-29: qty 30, 10d supply, fill #0

## 2024-08-29 NOTE — Telephone Encounter (Signed)
Pt calling again about refill.

## 2024-08-29 NOTE — Telephone Encounter (Signed)
 Medication refilled

## 2024-08-31 ENCOUNTER — Ambulatory Visit (INDEPENDENT_AMBULATORY_CARE_PROVIDER_SITE_OTHER): Admitting: Student

## 2024-08-31 DIAGNOSIS — M25511 Pain in right shoulder: Secondary | ICD-10-CM | POA: Diagnosis not present

## 2024-08-31 MED ORDER — LIDOCAINE HCL 1 % IJ SOLN
4.0000 mL | INTRAMUSCULAR | Status: AC | PRN
  Administered 2024-08-31: 4 mL

## 2024-08-31 MED ORDER — BETAMETHASONE SOD PHOS & ACET 6 (3-3) MG/ML IJ SUSP
2.0000 mL | INTRAMUSCULAR | Status: AC | PRN
  Administered 2024-08-31: 2 mL via INTRA_ARTICULAR

## 2024-08-31 NOTE — Progress Notes (Signed)
 "                                Chief Complaint: Right shoulder pain     History of Present Illness:    Barbara Reese is a 59 y.o. right-hand-dominant female who presents today for evaluation of pain in her right shoulder.  This has been ongoing for 2 months without any known injury she reports that has been worsening.  Pain is often been severe and is preventing her from sleeping at night.  Describes pain as sharp and is located on the anterior lateral shoulder without radiation down the arm.  She was seen for evaluation on 12/23 in the ED and was given a Medrol  Dosepak which temporarily gave some improvement.  She does have history of a partial rotator cuff tear in the left shoulder which has been managed fairly well with 2 subacromial injections.   Surgical History:   None  PMH/PSH/Family History/Social History/Meds/Allergies:    Past Medical History:  Diagnosis Date   Absence of cervix, acquired 10/18/2020   Achilles tendon disorder, left    PT HAS ON BOOT   Allergic rhinitis    Anemia    H/O   Anxiety    Depressive disorder    Hx   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   Followed by GYN, planning for likely hysterectomy, currently treated with Megace     GERD (gastroesophageal reflux disease)    Hepatitis    age 49- dx'd with chronic hepatitis- no problems as an adult   Hypertension    IBS (irritable bowel syndrome)    diet controlled, no meds   Insomnia    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   Osteoarthritis    knees   PONV (postoperative nausea and vomiting)    SVD (spontaneous vaginal delivery)    x 1   Viral respiratory illness 11/02/2018   Past Surgical History:  Procedure Laterality Date   CHOLECYSTECTOMY  1987   CYSTOSCOPY  02/18/2016   Procedure: CYSTOSCOPY;  Surgeon: Elveria Mungo, MD;  Location: [facility];  Service: Gynecology;;   HYSTEROSCOPY WITH D & C N/A 12/30/2015    Procedure: DILATATION AND CURETTAGE LELDON;  Surgeon: Winton Felt, MD;  Location: [facility];  Service: Gynecology;  Laterality: N/A;   KNEE SURGERY  2004   left knee- Dr. Beverley   MICROLARYNGOSCOPY N/A 11/29/2017   Procedure: MICROLARYNGOSCOPY;  Surgeon: Edda Mt, MD;  Location: [facility];  Service: ENT;  Laterality: N/A;   ROBOTIC ASSISTED TOTAL HYSTERECTOMY WITH SALPINGECTOMY N/A 02/18/2016   Procedure: ROBOTIC ASSISTED TOTAL HYSTERECTOMY WITH SALPINGECTOMY;  Surgeon: Elveria Mungo, MD;  Location: [facility];  Service: Gynecology;  Laterality: N/A;   UPPER GI ENDOSCOPY     gerd   Social History   Socioeconomic History   Marital status: Single    Spouse name: Not on file   Number of children: Not on file   Years of education: Not on file   Highest education level: Not on file  Occupational History   Occupation: NICU    Employer: [employer]    Comment: Nursing secretary  Tobacco Use   Smoking status: Never   Smokeless tobacco: Never  Vaping Use   Vaping status: Never Used  Substance and Sexual Activity   Alcohol use: Yes    Alcohol/week: 2.0 standard drinks of alcohol  Types: 2 Glasses of wine per week    Comment: wine OCC   Drug use: No   Sexual activity: Never    Birth control/protection: None  Other Topics Concern   Not on file  Social History Narrative   Pt is single and lives with 57 year old son.   Social Drivers of Health   Tobacco Use: Low Risk (08/15/2024)   Patient History    Smoking Tobacco Use: Never    Smokeless Tobacco Use: Never    Passive Exposure: Not on file  Financial Resource Strain: Not on file  Food Insecurity: Not on file  Transportation Needs: Not on file  Physical Activity: Not on file  Stress: Not on file  Social Connections: Not on file  Depression (PHQ2-9): Low Risk (01/04/2023)   Depression (PHQ2-9)    PHQ-2 Score: 1  Alcohol Screen: Not on file  Housing: Not on file  Utilities: Not on file  Health Literacy:  Not on file   Family History  Problem Relation Age of Onset   Osteoarthritis Mother    Hypertension Mother    Allergies Mother    Rheum arthritis Mother    Diabetes Father    Hypertension Sister    Heart attack Brother    Heart attack Sister    Allergies Son    Allergies Sister    Asthma Son    Clotting disorder Sister    Breast cancer Paternal Grandmother 56   Allergies[1] Current Outpatient Medications  Medication Sig Dispense Refill   ALPRAZolam  (XANAX ) 0.25 MG tablet Take 1 tablet (0.25 mg total) by mouth daily as needed for anxiety. 10 tablet 0   amLODipine  (NORVASC ) 5 MG tablet Take 1 tablet (5 mg total) by mouth at bedtime. 90 tablet 3   cetirizine  (ZYRTEC ) 10 MG tablet Take 10 mg by mouth daily as needed for allergies.     clobetasol  cream (TEMOVATE ) 0.05 % Apply 1 Application topically daily as needed. 45 g 0   fluticasone  (FLONASE ) 50 MCG/ACT nasal spray Place 1 spray into both nostrils daily. 16 g 5   hydrOXYzine  (ATARAX ) 10 MG tablet Take 1 tablet (10 mg total) by mouth 3 (three) times daily as needed. 30 tablet 0   losartan  (COZAAR ) 25 MG tablet Take 1 tablet (25 mg total) by mouth daily. 90 tablet 3   methocarbamol  (ROBAXIN ) 500 MG tablet Take 1 tablet (500 mg total) by mouth 2 (two) times daily. 20 tablet 0   methylPREDNISolone  (MEDROL  DOSEPAK) 4 MG TBPK tablet Take as prescribed on the box 21 tablet 0   oxyCODONE -acetaminophen  (PERCOCET) 10-325 MG tablet Take 1 tablet by mouth every 8 (eight) hours as needed for pain. 30 tablet 0   pantoprazole  (PROTONIX ) 40 MG tablet Take 1 tablet (40 mg total) by mouth daily. 90 tablet 3   pravastatin  (PRAVACHOL ) 40 MG tablet Take 1 tablet (40 mg total) by mouth at bedtime. 90 tablet 3   tiZANidine  (ZANAFLEX ) 4 MG tablet Take 1 tablet (4 mg total) by mouth every 8 (eight) hours as needed. (Patient not taking: Reported on 07/28/2024) 30 tablet 1   Current Facility-Administered Medications  Medication Dose Route Frequency Provider  Last Rate Last Admin   triamcinolone  acetonide (KENALOG ) 10 MG/ML injection 10 mg  10 mg Other Once        No results found.  Review of Systems:   A ROS was performed including pertinent positives and negatives as documented in the HPI.  Physical Exam :   Constitutional:  NAD and appears stated age Neurological: Alert and oriented Psych: Appropriate affect and cooperative Last menstrual period 02/09/2016.   Comprehensive Musculoskeletal Exam:    Right shoulder exam demonstrates tenderness of the anterior glenohumeral joint lateral deltoid.  Full active range of motion bilaterally to 160 degrees flexion, 30 degrees extra rotation, and internal rotation to L4.  Negative Neer and Hawkins impingement.  Positive speeds and pain with empty can although strength is well-maintained.  Imaging:   Xray review from 08/15/2024 (right shoulder 3 views): Moderate AC joint osteoarthritis and minimal glenohumeral degenerative changes.  Faint calcifications noted at the greater tuberosity suggestive of very mild calcific tendinitis.   I personally reviewed and interpreted the radiographs.   Assessment:   59 y.o. female with 27-month history of atraumatic right shoulder pain.  Discussed that I believe her pain is likely more multifactorial with symptoms suggesting involvement of rotator cuff tendinopathy.  Her shoulder motion is painful although strength and range of motion are intact so I do not believe there is a large tear present.  She does have moderate degeneration within the [facility] joint which may also be contributing.  Patient does have history of a partial left rotator cuff tear which has been managed with subacromial cortisone injections and she has gotten good relief from these.  Given this a subacromial injection was offered and performed today and she tolerated the procedure well.  She does have a follow-up scheduled with Dr. Vernetta on 1/19, therefore I discussed that this would be a good time to  assess relief with injection, reassess, and determine if advanced imaging is needed.  We will be happy to follow-up with her at any point the future.  Plan :    - Subacromial right shoulder injection performed today and continue as planned with follow-up on 1/19 with Dr. Vernetta    Procedure Note  Patient: Barbara Reese             Date of Birth: 1965/09/21           MRN: [id number]             Visit Date: 08/31/2024  Procedures: Visit Diagnoses:  1. Acute pain of right shoulder     Large Joint Inj: R subacromial bursa on 08/31/2024 12:15 PM Indications: pain Details: 22 G 1.5 in needle, posterior approach Medications: 4 mL lidocaine  1 %; 2 mL betamethasone  acetate-betamethasone  sodium phosphate  6 (3-3) MG/ML Outcome: tolerated well, no immediate complications Procedure, treatment alternatives, risks and benefits explained, specific risks discussed. Consent was given by the patient. Immediately prior to procedure a time out was called to verify the correct patient, procedure, equipment, support staff and site/side marked as required. Patient was prepped and draped in the usual sterile fashion.      I personally saw and evaluated the patient, and participated in the management and treatment plan.  Leonce Reveal, PA-C Orthopedics    [1]  Allergies Allergen Reactions   Atorvastatin  Other (See Comments)    Myalgias   Egg Protein-Containing Drug Products Nausea And Vomiting    Denies any issues with vaccines BUT does request the EGG-free vaccines since 2018   Ezetimibe  Other (See Comments)    Myalgias - could not get out of bed due to myalgia pain.    Hydrocodone -Acetaminophen  Nausea Only   Rosuvastatin  Other (See Comments)    Myalgias   Gabapentin  Rash    Denies airway involvement. Itchy rash   "

## 2024-09-08 ENCOUNTER — Ambulatory Visit

## 2024-09-11 ENCOUNTER — Encounter: Admitting: Orthopaedic Surgery

## 2024-09-14 ENCOUNTER — Ambulatory Visit: Admitting: Family Medicine

## 2024-09-15 ENCOUNTER — Ambulatory Visit
Admission: RE | Admit: 2024-09-15 | Discharge: 2024-09-15 | Disposition: A | Source: Ambulatory Visit | Attending: Family Medicine | Admitting: Family Medicine

## 2024-09-15 DIAGNOSIS — Z1231 Encounter for screening mammogram for malignant neoplasm of breast: Secondary | ICD-10-CM

## 2024-09-28 ENCOUNTER — Other Ambulatory Visit (HOSPITAL_COMMUNITY): Payer: Self-pay

## 2024-09-28 ENCOUNTER — Other Ambulatory Visit: Payer: Self-pay | Admitting: Family Medicine

## 2024-09-28 ENCOUNTER — Ambulatory Visit: Admitting: Family Medicine

## 2024-09-28 ENCOUNTER — Other Ambulatory Visit: Payer: Self-pay

## 2024-09-28 ENCOUNTER — Telehealth: Payer: Self-pay | Admitting: Family Medicine

## 2024-09-28 NOTE — Telephone Encounter (Signed)
 Patient called and is needing a refill. [facility] states that [facility] request . The refill is for her pain medicine, percocet. Please advise.

## 2024-09-28 NOTE — Telephone Encounter (Signed)
 Last OV 11/02/23 Next OV not scheduled  Last refill 08/29/24 Qty #30/0

## 2024-10-04 ENCOUNTER — Encounter: Admitting: Orthopaedic Surgery

## 2024-10-17 ENCOUNTER — Ambulatory Visit: Admitting: Family Medicine
